# Patient Record
Sex: Female | Born: 1980 | Race: Black or African American | Hispanic: No | Smoking: Never smoker
Health system: Southern US, Community
[De-identification: ages and names within clinical notes are randomized; demographics above are authoritative.]

## PROBLEM LIST (undated history)

## (undated) ENCOUNTER — Ambulatory Visit: Discharge status: Absent without leave | Payer: BC Managed Care – PPO

## (undated) DIAGNOSIS — K5900 Constipation, unspecified: Secondary | ICD-10-CM

## (undated) DIAGNOSIS — R079 Chest pain, unspecified: Secondary | ICD-10-CM

## (undated) DIAGNOSIS — D447 Neoplasm of uncertain behavior of aortic body and other paraganglia: Secondary | ICD-10-CM

## (undated) DIAGNOSIS — M549 Dorsalgia, unspecified: Secondary | ICD-10-CM

## (undated) DIAGNOSIS — E785 Hyperlipidemia, unspecified: Secondary | ICD-10-CM

## (undated) DIAGNOSIS — Z91018 Allergy to other foods: Secondary | ICD-10-CM

## (undated) DIAGNOSIS — F32A Depression, unspecified: Secondary | ICD-10-CM

## (undated) DIAGNOSIS — F329 Major depressive disorder, single episode, unspecified: Secondary | ICD-10-CM

## (undated) DIAGNOSIS — F419 Anxiety disorder, unspecified: Secondary | ICD-10-CM

## (undated) DIAGNOSIS — R112 Nausea with vomiting, unspecified: Secondary | ICD-10-CM

## (undated) DIAGNOSIS — E669 Obesity, unspecified: Secondary | ICD-10-CM

## (undated) DIAGNOSIS — Z973 Presence of spectacles and contact lenses: Secondary | ICD-10-CM

## (undated) DIAGNOSIS — M7989 Other specified soft tissue disorders: Secondary | ICD-10-CM

## (undated) DIAGNOSIS — R002 Palpitations: Secondary | ICD-10-CM

## (undated) DIAGNOSIS — Z9889 Other specified postprocedural states: Secondary | ICD-10-CM

## (undated) DIAGNOSIS — F988 Other specified behavioral and emotional disorders with onset usually occurring in childhood and adolescence: Secondary | ICD-10-CM

## (undated) DIAGNOSIS — R7303 Prediabetes: Secondary | ICD-10-CM

## (undated) DIAGNOSIS — E559 Vitamin D deficiency, unspecified: Secondary | ICD-10-CM

## (undated) DIAGNOSIS — I10 Essential (primary) hypertension: Secondary | ICD-10-CM

## (undated) DIAGNOSIS — I499 Cardiac arrhythmia, unspecified: Secondary | ICD-10-CM

## (undated) DIAGNOSIS — D649 Anemia, unspecified: Secondary | ICD-10-CM

## (undated) DIAGNOSIS — M2550 Pain in unspecified joint: Secondary | ICD-10-CM

## (undated) DIAGNOSIS — R0602 Shortness of breath: Secondary | ICD-10-CM

## (undated) HISTORY — DX: Dorsalgia, unspecified: M54.9

## (undated) HISTORY — PX: TONSILLECTOMY: SUR1361

## (undated) HISTORY — DX: Chest pain, unspecified: R07.9

## (undated) HISTORY — DX: Shortness of breath: R06.02

## (undated) HISTORY — DX: Allergy to other foods: Z91.018

## (undated) HISTORY — DX: Prediabetes: R73.03

## (undated) HISTORY — PX: BACK SURGERY: SHX140

## (undated) HISTORY — DX: Vitamin D deficiency, unspecified: E55.9

## (undated) HISTORY — DX: Other specified soft tissue disorders: M79.89

## (undated) HISTORY — DX: Hyperlipidemia, unspecified: E78.5

## (undated) HISTORY — DX: Constipation, unspecified: K59.00

## (undated) HISTORY — DX: Depression, unspecified: F32.A

## (undated) HISTORY — DX: Anemia, unspecified: D64.9

## (undated) HISTORY — DX: Neoplasm of uncertain behavior of aortic body and other paraganglia: D44.7

## (undated) HISTORY — PX: DILATION AND CURETTAGE OF UTERUS: SHX78

## (undated) HISTORY — PX: OTHER SURGICAL HISTORY: SHX169

## (undated) HISTORY — PX: WISDOM TOOTH EXTRACTION: SHX21

## (undated) HISTORY — DX: Obesity, unspecified: E66.9

## (undated) HISTORY — DX: Other specified behavioral and emotional disorders with onset usually occurring in childhood and adolescence: F98.8

## (undated) HISTORY — DX: Anxiety disorder, unspecified: F41.9

## (undated) HISTORY — PX: SALPINGECTOMY: SHX328

## (undated) HISTORY — DX: Pain in unspecified joint: M25.50

---
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
## 2002-06-21 ENCOUNTER — Encounter: Payer: Self-pay | Admitting: Internal Medicine

## 2002-06-21 ENCOUNTER — Emergency Department (HOSPITAL_COMMUNITY): Admission: EM | Admit: 2002-06-21 | Discharge: 2002-06-21 | Payer: Self-pay | Admitting: Internal Medicine

## 2002-11-09 ENCOUNTER — Ambulatory Visit (HOSPITAL_COMMUNITY): Admission: RE | Admit: 2002-11-09 | Discharge: 2002-11-09 | Payer: Self-pay | Admitting: Otolaryngology

## 2002-11-09 ENCOUNTER — Encounter: Payer: Self-pay | Admitting: Otolaryngology

## 2002-12-29 ENCOUNTER — Encounter: Payer: Self-pay | Admitting: Family Medicine

## 2002-12-29 ENCOUNTER — Ambulatory Visit (HOSPITAL_COMMUNITY): Admission: RE | Admit: 2002-12-29 | Discharge: 2002-12-29 | Payer: Self-pay | Admitting: Family Medicine

## 2002-12-31 ENCOUNTER — Encounter (INDEPENDENT_AMBULATORY_CARE_PROVIDER_SITE_OTHER): Payer: Self-pay | Admitting: Specialist

## 2002-12-31 ENCOUNTER — Ambulatory Visit (HOSPITAL_BASED_OUTPATIENT_CLINIC_OR_DEPARTMENT_OTHER): Admission: RE | Admit: 2002-12-31 | Discharge: 2002-12-31 | Payer: Self-pay | Admitting: Otolaryngology

## 2003-01-02 ENCOUNTER — Emergency Department (HOSPITAL_COMMUNITY): Admission: EM | Admit: 2003-01-02 | Discharge: 2003-01-03 | Payer: Self-pay | Admitting: Emergency Medicine

## 2003-01-04 ENCOUNTER — Inpatient Hospital Stay (HOSPITAL_COMMUNITY): Admission: AD | Admit: 2003-01-04 | Discharge: 2003-01-06 | Payer: Self-pay | Admitting: Family Medicine

## 2003-02-03 ENCOUNTER — Emergency Department (HOSPITAL_COMMUNITY): Admission: EM | Admit: 2003-02-03 | Discharge: 2003-02-03 | Payer: Self-pay | Admitting: Internal Medicine

## 2003-04-09 ENCOUNTER — Encounter (HOSPITAL_COMMUNITY): Admission: RE | Admit: 2003-04-09 | Discharge: 2003-05-09 | Payer: Self-pay | Admitting: Family Medicine

## 2003-04-09 ENCOUNTER — Encounter: Payer: Self-pay | Admitting: Family Medicine

## 2004-03-06 ENCOUNTER — Ambulatory Visit (HOSPITAL_COMMUNITY): Admission: RE | Admit: 2004-03-06 | Discharge: 2004-03-06 | Payer: Self-pay | Admitting: Obstetrics & Gynecology

## 2004-03-21 ENCOUNTER — Ambulatory Visit (HOSPITAL_COMMUNITY): Admission: RE | Admit: 2004-03-21 | Discharge: 2004-03-21 | Payer: Self-pay | Admitting: Obstetrics & Gynecology

## 2004-04-08 ENCOUNTER — Emergency Department (HOSPITAL_COMMUNITY): Admission: EM | Admit: 2004-04-08 | Discharge: 2004-04-09 | Payer: Self-pay | Admitting: Emergency Medicine

## 2004-08-29 ENCOUNTER — Ambulatory Visit: Payer: Self-pay | Admitting: Family Medicine

## 2004-12-01 ENCOUNTER — Ambulatory Visit: Payer: Self-pay | Admitting: Family Medicine

## 2004-12-13 ENCOUNTER — Ambulatory Visit: Payer: Self-pay | Admitting: Family Medicine

## 2005-02-21 ENCOUNTER — Ambulatory Visit: Payer: Self-pay | Admitting: Family Medicine

## 2005-02-26 ENCOUNTER — Ambulatory Visit: Payer: Self-pay | Admitting: Family Medicine

## 2005-03-01 ENCOUNTER — Ambulatory Visit: Payer: Self-pay | Admitting: Family Medicine

## 2005-03-07 ENCOUNTER — Ambulatory Visit: Payer: Self-pay | Admitting: Orthopedic Surgery

## 2005-03-13 ENCOUNTER — Ambulatory Visit: Payer: Self-pay | Admitting: Family Medicine

## 2005-03-14 ENCOUNTER — Ambulatory Visit (HOSPITAL_COMMUNITY): Admission: RE | Admit: 2005-03-14 | Discharge: 2005-03-14 | Payer: Self-pay | Admitting: Family Medicine

## 2005-03-20 ENCOUNTER — Ambulatory Visit (HOSPITAL_COMMUNITY): Admission: RE | Admit: 2005-03-20 | Discharge: 2005-03-20 | Payer: Self-pay | Admitting: Family Medicine

## 2005-04-09 ENCOUNTER — Ambulatory Visit: Payer: Self-pay | Admitting: Family Medicine

## 2005-05-01 ENCOUNTER — Ambulatory Visit: Payer: Self-pay | Admitting: Psychology

## 2005-06-20 ENCOUNTER — Ambulatory Visit: Payer: Self-pay | Admitting: Family Medicine

## 2005-08-09 ENCOUNTER — Ambulatory Visit: Payer: Self-pay | Admitting: Family Medicine

## 2005-08-13 ENCOUNTER — Ambulatory Visit (HOSPITAL_COMMUNITY): Admission: RE | Admit: 2005-08-13 | Discharge: 2005-08-13 | Payer: Self-pay | Admitting: Family Medicine

## 2005-09-25 ENCOUNTER — Ambulatory Visit: Payer: Self-pay | Admitting: Family Medicine

## 2005-10-26 ENCOUNTER — Ambulatory Visit: Payer: Self-pay | Admitting: Family Medicine

## 2006-01-21 ENCOUNTER — Ambulatory Visit: Payer: Self-pay | Admitting: Family Medicine

## 2006-01-22 ENCOUNTER — Ambulatory Visit (HOSPITAL_COMMUNITY): Admission: RE | Admit: 2006-01-22 | Discharge: 2006-01-22 | Payer: Self-pay | Admitting: Family Medicine

## 2006-05-17 ENCOUNTER — Emergency Department (HOSPITAL_COMMUNITY): Admission: EM | Admit: 2006-05-17 | Discharge: 2006-05-17 | Payer: Self-pay | Admitting: Emergency Medicine

## 2007-01-05 ENCOUNTER — Emergency Department (HOSPITAL_COMMUNITY): Admission: EM | Admit: 2007-01-05 | Discharge: 2007-01-05 | Payer: Self-pay | Admitting: Emergency Medicine

## 2007-07-02 ENCOUNTER — Ambulatory Visit: Payer: Self-pay | Admitting: Family Medicine

## 2007-07-08 ENCOUNTER — Other Ambulatory Visit: Admission: RE | Admit: 2007-07-08 | Discharge: 2007-07-08 | Payer: Self-pay | Admitting: Obstetrics and Gynecology

## 2007-08-06 ENCOUNTER — Ambulatory Visit: Payer: Self-pay | Admitting: Family Medicine

## 2007-08-06 LAB — CONVERTED CEMR LAB: TSH: 2.022 microintl units/mL (ref 0.350–5.50)

## 2007-09-08 ENCOUNTER — Ambulatory Visit: Payer: Self-pay | Admitting: Family Medicine

## 2007-09-09 ENCOUNTER — Encounter: Payer: Self-pay | Admitting: Family Medicine

## 2007-09-09 LAB — CONVERTED CEMR LAB
Chlamydia, DNA Probe: NEGATIVE
GC Probe Amp, Genital: NEGATIVE
Gardnerella vaginalis: POSITIVE — AB
Trichomonal Vaginitis: NEGATIVE

## 2007-11-14 ENCOUNTER — Encounter: Payer: Self-pay | Admitting: Family Medicine

## 2007-11-14 DIAGNOSIS — N760 Acute vaginitis: Secondary | ICD-10-CM | POA: Insufficient documentation

## 2008-06-15 ENCOUNTER — Other Ambulatory Visit: Admission: RE | Admit: 2008-06-15 | Discharge: 2008-06-15 | Payer: Self-pay | Admitting: Unknown Physician Specialty

## 2008-06-15 ENCOUNTER — Encounter (INDEPENDENT_AMBULATORY_CARE_PROVIDER_SITE_OTHER): Payer: Self-pay | Admitting: Unknown Physician Specialty

## 2008-09-17 ENCOUNTER — Emergency Department (HOSPITAL_COMMUNITY): Admission: EM | Admit: 2008-09-17 | Discharge: 2008-09-17 | Payer: Self-pay | Admitting: Emergency Medicine

## 2008-09-29 ENCOUNTER — Ambulatory Visit: Payer: Self-pay | Admitting: Family Medicine

## 2008-09-29 DIAGNOSIS — N949 Unspecified condition associated with female genital organs and menstrual cycle: Secondary | ICD-10-CM

## 2008-09-29 DIAGNOSIS — N3000 Acute cystitis without hematuria: Secondary | ICD-10-CM

## 2008-09-29 LAB — CONVERTED CEMR LAB
Bilirubin Urine: NEGATIVE
Glucose, Urine, Semiquant: NEGATIVE
Ketones, urine, test strip: NEGATIVE
Specific Gravity, Urine: 1.02

## 2008-09-30 ENCOUNTER — Encounter: Payer: Self-pay | Admitting: Family Medicine

## 2008-10-03 DIAGNOSIS — J45909 Unspecified asthma, uncomplicated: Secondary | ICD-10-CM | POA: Insufficient documentation

## 2009-03-19 ENCOUNTER — Emergency Department (HOSPITAL_COMMUNITY): Admission: EM | Admit: 2009-03-19 | Discharge: 2009-03-20 | Payer: Self-pay | Admitting: Emergency Medicine

## 2009-03-23 ENCOUNTER — Ambulatory Visit: Payer: Self-pay | Admitting: Family Medicine

## 2009-03-23 DIAGNOSIS — N912 Amenorrhea, unspecified: Secondary | ICD-10-CM

## 2009-03-23 DIAGNOSIS — R51 Headache: Secondary | ICD-10-CM | POA: Insufficient documentation

## 2009-03-23 DIAGNOSIS — IMO0002 Reserved for concepts with insufficient information to code with codable children: Secondary | ICD-10-CM | POA: Insufficient documentation

## 2009-03-23 DIAGNOSIS — R519 Headache, unspecified: Secondary | ICD-10-CM | POA: Insufficient documentation

## 2009-03-25 ENCOUNTER — Emergency Department (HOSPITAL_COMMUNITY): Admission: EM | Admit: 2009-03-25 | Discharge: 2009-03-25 | Payer: Self-pay | Admitting: Emergency Medicine

## 2009-03-25 DIAGNOSIS — E663 Overweight: Secondary | ICD-10-CM | POA: Insufficient documentation

## 2009-09-13 ENCOUNTER — Other Ambulatory Visit: Admission: RE | Admit: 2009-09-13 | Discharge: 2009-09-13 | Payer: Self-pay | Admitting: Unknown Physician Specialty

## 2010-04-26 ENCOUNTER — Ambulatory Visit: Payer: Self-pay | Admitting: Family Medicine

## 2010-04-26 LAB — CONVERTED CEMR LAB
Glucose, Urine, Semiquant: NEGATIVE
Nitrite: NEGATIVE
Specific Gravity, Urine: 1.02
Urobilinogen, UA: 0.2
WBC Urine, dipstick: NEGATIVE
pH: 7

## 2010-04-28 ENCOUNTER — Encounter: Payer: Self-pay | Admitting: Family Medicine

## 2010-08-21 ENCOUNTER — Emergency Department (HOSPITAL_COMMUNITY): Admission: EM | Admit: 2010-08-21 | Discharge: 2010-08-21 | Payer: Self-pay | Admitting: Emergency Medicine

## 2010-08-23 ENCOUNTER — Telehealth: Payer: Self-pay | Admitting: Family Medicine

## 2010-09-21 ENCOUNTER — Telehealth: Payer: Self-pay | Admitting: Family Medicine

## 2010-11-12 ENCOUNTER — Encounter: Payer: Self-pay | Admitting: Family Medicine

## 2010-11-12 ENCOUNTER — Encounter: Payer: Self-pay | Admitting: Obstetrics & Gynecology

## 2010-11-21 NOTE — Letter (Signed)
Summary: TB Skin Test  [facility]  [address]   [place], [place] [address]   Phone: [phone]  Fax: [phone]          TB Skin Test      Kerrington BROWN DOB: October 11, 1981   PPD Skin Test:    Vaccine Type: PPD    Site: left forearm    Mfr: Sanofi Pasteur    Dose: 0.1 ml    Route: ID    Given by: Adella Hare LPN    Exp. Date: 08/04/2011    Lot #: [id number]    Result: Negative, 0mm    Read by: Adella Hare LPN  April 29, 2951 2:29 PM

## 2010-11-21 NOTE — Assessment & Plan Note (Signed)
Summary: school physical- room 1   Vital Signs:  Patient profile:   30 year old female Menstrual status:  irregular Height:      64.5 inches Weight:      173.75 pounds BMI:     29.47 O2 Sat:      99 % on Room air Pulse rate:   87 / minute Resp:     16 per minute BP sitting:   100 / 68  (left arm)  Vitals Entered By: Adella Hare LPN (April 27, 5783 2:13 PM) CC: school physical Is Patient Diabetic? No Pain Assessment Patient in pain? no        CC:  school physical.  History of Present Illness: Pt is here today for physical for school.  She is unemployed, and is taking a Financial planner.  This starts in approx 10 days.    Is going to a [facility] Clinic in [place].  Started on Phentermine daily, and once weekly HCG injections. She has started exercising regularly and is trying to eat healthier.  Pt has no complaints or concerns. See's GYN for paps, etc.   Allergies (verified): 1)  ! Rocephin  Past History:  Past medical, surgical, family and social histories (including risk factors) reviewed, and no changes noted (except as noted below).  Past Medical History: OBESITY, UNSPECIFIED (ICD-278.00) DEPRESSION (ICD-311) - Situational during divorce, Resolved  Past Surgical History: Reviewed history from 11/14/2007 and no changes required. Tonsillectomy  Family History: Reviewed history from 11/14/2007 and no changes required. Mother   living   Hypertension, Depression, Diabets Mellitus Father  living    b52  deficiency two Brothers  living  one Asthma/  one  Seizure disorder  Social History: Reviewed history from 11/14/2007 and no changes required. Single Never Smoked Alcohol use-no Drug use-no  Review of Systems General:  Denies chills, fatigue, fever, and loss of appetite. Eyes:  Denies blurring and double vision. ENT:  Denies decreased hearing, earache, nasal congestion, ringing in ears, and sore throat. CV:  Denies chest pain or discomfort,  palpitations, and swelling of feet. Resp:  Denies cough and shortness of breath. GI:  Denies abdominal pain, bloody stools, change in bowel habits, dark tarry stools, indigestion, loss of appetite, nausea, and vomiting. GU:  Denies abnormal vaginal bleeding, dysuria, incontinence, and urinary frequency. MS:  Denies joint pain, joint swelling, low back pain, and mid back pain. Neuro:  Denies headaches, numbness, and tingling. Psych:  Denies anxiety and depression.  Physical Exam  General:  Well-developed,well-nourished,in no acute distress; alert,appropriate and cooperative throughout examination Head:  Normocephalic and atraumatic without obvious abnormalities. No apparent alopecia or balding. Eyes:  pupils equal, pupils round, pupils reactive to light, and no nystagmus.   Ears:  External ear exam shows no significant lesions or deformities.  Otoscopic examination reveals clear canals, tympanic membranes are intact bilaterally without bulging, retraction, inflammation or discharge. Hearing is grossly normal bilaterally. Nose:  External nasal examination shows no deformity or inflammation. Nasal mucosa are pink and moist without lesions or exudates. Mouth:  Oral mucosa and oropharynx without lesions or exudates.  Teeth in good repair. Neck:  No deformities, masses, or tenderness noted.no thyromegaly and no thyroid nodules or tenderness.   Lungs:  Normal respiratory effort, chest expands symmetrically. Lungs are clear to auscultation, no crackles or wheezes. Heart:  Normal rate and regular rhythm. S1 and S2 normal without gallop, murmur, click, rub or other extra sounds. Abdomen:  Bowel sounds positive,abdomen soft and non-tender  without masses, organomegaly or hernias noted. Msk:  Spine:  Nl ROM.  Pulses:  R radial normal, R posterior tibial normal, R dorsalis pedis normal, L radial normal, L posterior tibial normal, and L dorsalis pedis normal.   Extremities:  No clubbing, cyanosis, edema, or  deformity noted with normal full range of motion of all joints.   Neurologic:  alert & oriented X3, strength normal in all extremities, sensation intact to light touch, gait normal, and DTRs symmetrical and normal.   Skin:  Intact without suspicious lesions or rashes Cervical Nodes:  No lymphadenopathy noted Psych:  Cognition and judgment appear intact. Alert and cooperative with normal attention span and concentration. No apparent delusions, illusions, hallucinations   Impression & Recommendations:  Problem # 1:  OTH GENERAL MEDICAL EXAMINATION ADMIN PURPOSES (ICD-V70.3) Assessment New  Complete Medication List: 1)  Albuterol 90 Mcg/act Aers (Albuterol) .... Use two puffs every 4-6 hours 2)  Phentermine Hcl 37.5 Mg Caps (Phentermine hcl) .... Take once daily  Other Orders: Urinalysis ([id number]) TB Skin Test (09811) Admin 1st Vaccine (91478)  Patient Instructions: 1)  Please schedule a follow-up appointment in 6 months. 2)  Continue with exercise and wt loss efforts. 3)  Good luck with school! 4)  Please check with the health dept for your last Tetnus vaccine date.   Immunizations Administered:  PPD Skin Test:    Vaccine Type: PPD    Site: left forearm    Mfr: Sanofi Pasteur    Dose: 0.1 ml    Route: ID    Given by: Adella Hare LPN    Exp. Date: 08/04/2011    Lot #: [id number]  Laboratory Results   Urine Tests  Date/Time Received: April 26, 2010 3:30 PM  Date/Time Reported: April 26, 2010 3:30 PM   Routine Urinalysis   Color: yellow Appearance: Clear Glucose: negative   (Normal Range: Negative) Bilirubin: negative   (Normal Range: Negative) Ketone: trace (5)   (Normal Range: Negative) Spec. Gravity: 1.020   (Normal Range: 1.003-1.035) Blood: negative   (Normal Range: Negative) pH: 7.0   (Normal Range: 5.0-8.0) Protein: negative   (Normal Range: Negative) Urobilinogen: 0.2   (Normal Range: 0-1) Nitrite: negative   (Normal Range: Negative) Leukocyte Esterace:  negative   (Normal Range: Negative)          Appended Document: school physical- room 1   PPD Results    Date of reading: 04/28/2010    Results: < 5mm    Interpretation: negative

## 2010-11-21 NOTE — Progress Notes (Signed)
Summary: MENSTRUAL PROBLEMS  Phone Note Call from Patient   Summary of Call: PATIENT CALLD STATES THAT HER  MENSTRAL IS VERY HEAVY BLEEDING,BLOOD CLOTS AND SHE GET LIGHT HEADED AND IN A LOT OF PAIN....PLSE CALL [phone]...  Initial call taken by: Eugenio Hoes,  September 21, 2010 11:21 AM  Follow-up for Phone Call        based on severity of her symptoms she needs to go to ed since reports lightheaded and f/u  withl gynae , pls let her know  Follow-up by: Syliva Overman MD,  September 21, 2010 5:08 PM  Additional Follow-up for Phone Call Additional follow up Details #1::        returned call, no answer, no voicemail set up Additional Follow-up by: Adella Hare LPN,  September 22, 2010 3:57 PM

## 2010-11-21 NOTE — Progress Notes (Signed)
Summary: note  Phone Note Call from Patient   Summary of Call: went to ed monday  at [facility] and the doctor put her out of work for the rest of the week it is for pt physical training  call at  [phone] Initial call taken by: Lind Guest,  August 23, 2010 12:01 PM  Follow-up for Phone Call        let pt know she needs to provide me with the info thAT STATE SHE IS TO BE OUT if you are unable to obtain this from the ED printed record Follow-up by: Syliva Overman MD,  August 23, 2010 7:58 PM  Additional Follow-up for Phone Call Additional follow up Details #1::        called patient, no answer Additional Follow-up by: Adella Hare LPN,  August 25, 2010 4:15 PM    Additional Follow-up for Phone Call Additional follow up Details #2::    patient has not called back re this matter Follow-up by: Adella Hare LPN,  August 28, 2010 2:59 PM

## 2011-01-03 LAB — URINALYSIS, ROUTINE W REFLEX MICROSCOPIC
Bilirubin Urine: NEGATIVE
Glucose, UA: NEGATIVE mg/dL
Nitrite: NEGATIVE
Urobilinogen, UA: 0.2 mg/dL (ref 0.0–1.0)
pH: 5.5 (ref 5.0–8.0)

## 2011-01-29 ENCOUNTER — Telehealth: Payer: Self-pay | Admitting: Family Medicine

## 2011-01-29 LAB — URINALYSIS, ROUTINE W REFLEX MICROSCOPIC
Bilirubin Urine: NEGATIVE
Ketones, ur: NEGATIVE mg/dL
Nitrite: NEGATIVE
Protein, ur: NEGATIVE mg/dL

## 2011-01-29 LAB — DIFFERENTIAL
Basophils Relative: 0 % (ref 0–1)
Eosinophils Absolute: 0 10*3/uL (ref 0.0–0.7)
Monocytes Absolute: 0.5 10*3/uL (ref 0.1–1.0)
Neutro Abs: 2.5 10*3/uL (ref 1.7–7.7)
Neutrophils Relative %: 62 % (ref 43–77)

## 2011-01-29 LAB — PREGNANCY, URINE: Preg Test, Ur: NEGATIVE

## 2011-01-29 LAB — CBC
HCT: 37.1 % (ref 36.0–46.0)
MCHC: 34.2 g/dL (ref 30.0–36.0)
MCV: 86.1 fL (ref 78.0–100.0)
RBC: 4.32 MIL/uL (ref 3.87–5.11)
WBC: 4 10*3/uL (ref 4.0–10.5)

## 2011-01-29 LAB — BASIC METABOLIC PANEL
Chloride: 106 mEq/L (ref 96–112)
GFR calc non Af Amer: >60 mL/min (ref >60)
Glucose, Bld: 83 mg/dL (ref 70–99)
Potassium: 3.6 mEq/L (ref 3.5–5.1)
Sodium: 139 mEq/L (ref 135–145)

## 2011-01-29 NOTE — Telephone Encounter (Signed)
psl add on today this morning if possible

## 2011-01-29 NOTE — Telephone Encounter (Signed)
Called patient and left message a come in the morning at 815

## 2011-01-30 ENCOUNTER — Encounter: Payer: Self-pay | Admitting: Family Medicine

## 2011-01-30 ENCOUNTER — Ambulatory Visit (INDEPENDENT_AMBULATORY_CARE_PROVIDER_SITE_OTHER): Payer: Self-pay | Admitting: Family Medicine

## 2011-01-30 VITALS — BP 112/70 | HR 90 | Resp 16 | Ht 64.5 in | Wt 166.1 lb

## 2011-01-30 DIAGNOSIS — J45909 Unspecified asthma, uncomplicated: Secondary | ICD-10-CM

## 2011-01-30 DIAGNOSIS — E663 Overweight: Secondary | ICD-10-CM

## 2011-01-30 DIAGNOSIS — N760 Acute vaginitis: Secondary | ICD-10-CM

## 2011-01-30 MED ORDER — ALBUTEROL SULFATE HFA 108 (90 BASE) MCG/ACT IN AERS: 2.0000 | INHALATION_SPRAY | Freq: Four times a day (QID) | RESPIRATORY_TRACT | Status: DC | PRN

## 2011-01-30 NOTE — Progress Notes (Signed)
  Subjective:    Patient ID: Shannon [name], female    DOB: [birth year], 30 y.o.   MRN: [id number]  HPI  Pt was in the ED 5 days ago for dysuria and frequency, as well as vaginal burning. She was treated for a UTI with Rocephin , since then she has been active  3 days ago, her symptoms have fklared up with severe perineal and vulval burning , pain rawness and rash. She denies fever or chills. She has dysuria with no frequency. No new personal care products. She is miserable and in a lot of pain  Review of Systems Denies recent fever or chills. Denies sinus pressure, nasal congestion, ear pain or sore throat. Denies chest congestion, productive cough or wheezing. Denies chest pains, palpitations, paroxysmal nocturnal dyspnea, orthopnea and leg swelling        Objective:   Physical Exam    Patient alert and oriented and in no Cardiopulmonary distress.She is in exquisite pain however.  HEENT: No facial asymmetry, EOMI, no sinus tenderness, TM's clear, Oropharynx pink and moist.  Neck supple no adenopathy.  Chest: Clear to auscultation bilaterally.  CVS: S1, S2 no murmurs, no S3.  ABD: Soft non tender. Bowel sounds normal.  Ext: No edema    Skin:  ulcerations in vulva. GU: speculum exam limited by pain, however erythema , swelling and ulceration noted, also clear d/c  CNS: CN 2-12 intact, power, tone and sensation normal throughout.     Assessment & Plan:

## 2011-01-30 NOTE — Patient Instructions (Addendum)
F/u  In 4 to 5 months. Specimens will be sent for testing , and you will be notified as soon as they are available.  It is important that you exercise regularly at least 30 minutes 5 times a week. If you develop chest pain, have severe difficulty breathing, or feel very tired, stop exercising immediately and seek medical attention  A healthy diet is rich in fruit, vegetables and whole grains. Poultry fish, nuts and beans are a healthy choice for protein rather then red meat. A low sodium diet and drinking 64 ounces of water daily is generally recommended. Oils and sweet should be limited. Carbohydrates especially for those who are diabetic or overweight, should be limited to 34-45 gram per meal. It is important to eat on a regular schedule, at least 3 times daily. Snacks should be primarily fruits, vegetables or nuts.  Pls start using a cream for fungal infection on the skin in your groin that itches and is painful. OK to use vaseline as a barrier. 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 .Condom use is advised at all times

## 2011-01-31 ENCOUNTER — Telehealth: Payer: Self-pay

## 2011-01-31 LAB — WET PREP BY MOLECULAR PROBE
Candida species: NEGATIVE
Gardnerella vaginalis: NEGATIVE

## 2011-01-31 LAB — GC/CHLAMYDIA PROBE AMP, GENITAL: GC Probe Amp, Genital: NEGATIVE

## 2011-01-31 MED ORDER — FLUCONAZOLE 100 MG PO TABS: 100.0000 mg | ORAL_TABLET | Freq: Every day | ORAL | Status: AC

## 2011-01-31 MED ORDER — LIDOCAINE HCL 2 % EX GEL: CUTANEOUS | Status: AC

## 2011-01-31 NOTE — Telephone Encounter (Signed)
Spoke with pt and will send in topical lidocaine for application to vulva

## 2011-01-31 NOTE — Progress Notes (Signed)
Patient aware.

## 2011-02-01 ENCOUNTER — Emergency Department (HOSPITAL_COMMUNITY)
Admission: EM | Admit: 2011-02-01 | Discharge: 2011-02-01 | Disposition: A | Discharge status: Home / Self Care | Payer: Medicaid Other | Attending: Emergency Medicine | Admitting: Emergency Medicine

## 2011-02-01 ENCOUNTER — Telehealth: Payer: Self-pay | Admitting: Family Medicine

## 2011-02-01 DIAGNOSIS — L509 Urticaria, unspecified: Secondary | ICD-10-CM | POA: Insufficient documentation

## 2011-02-01 DIAGNOSIS — J45909 Unspecified asthma, uncomplicated: Secondary | ICD-10-CM | POA: Insufficient documentation

## 2011-02-01 NOTE — Telephone Encounter (Signed)
The patient took a diflucan this morning and it caused a reaction and she broke out all over her stomach and inside her mouth is all swollen and welted up and she is still severely swollen in her vaginal area and she is aware that all her tests were negative. Wants to know what else it could be since her tests were negative but right now she is at the ER from her allergic reaction from the diflucan.

## 2011-02-01 NOTE — Telephone Encounter (Signed)
pls see my 2:12 response

## 2011-02-01 NOTE — Telephone Encounter (Signed)
The lab is faxing the HSV 2 results and will forward the message to the doctor about her stomach being raw.

## 2011-02-01 NOTE — Telephone Encounter (Signed)
I suggest she have gynae eval the area, she needs to call her  gynae  Office, also may ask the ED doc while there to reassess

## 2011-02-02 ENCOUNTER — Encounter: Payer: Self-pay | Admitting: Family Medicine

## 2011-02-02 NOTE — Telephone Encounter (Signed)
She was very pleased with the treatment at the hospital. She feels much better and doesn't think she needs the GYN appt

## 2011-02-07 ENCOUNTER — Encounter: Payer: Self-pay | Admitting: Family Medicine

## 2011-02-18 ENCOUNTER — Encounter: Payer: Self-pay | Admitting: Family Medicine

## 2011-02-18 DIAGNOSIS — N760 Acute vaginitis: Secondary | ICD-10-CM | POA: Insufficient documentation

## 2011-02-18 NOTE — Assessment & Plan Note (Signed)
Deteriorated, specimens sent and symptomatic treatment prescribed

## 2011-02-18 NOTE — Assessment & Plan Note (Signed)
Improved. Pt applauded on succesful weight loss through lifestyle change, and encouraged to continue same. Weight loss goal set for the next several months.  

## 2011-02-18 NOTE — Assessment & Plan Note (Signed)
Stable , proventil used infrequently on an as needed basis

## 2011-03-09 NOTE — H&P (Signed)
NAME:  Shannon [name], Shannon [name]                            ACCOUNT NO.:  [id number]   MEDICAL RECORD NO.:  [id number]                   PATIENT TYPE:  INP   LOCATION:  A325                                 FACILITY:  [facility]   PHYSICIAN:  Hanley Hays. Dechurch, M.D.           DATE OF BIRTH:  [birth year]   DATE OF ADMISSION:  01/04/2003  DATE OF DISCHARGE:                                HISTORY & PHYSICAL   HISTORY OF PRESENT ILLNESS:  A 30 year old African-American female with a  past medical history of asthma, obesity, and recurrent tonsillitis status  post elected tonsillectomy on December 31, 2002, without complication who  developed nausea and vomiting following the procedure which persisted.  She  then developed associated diarrhea and fevers.  She was seen at the [facility] ER and given IV fluids and discharged home as she apparently  was stable.  She states she had a temperature of 102.  Because of ongoing  vomiting and inability to maintain p.o.'s including her medications, the  patient is being referred for admission by her primary care Zaivion Kundrat,  Milus Mallick. Lodema Hong, M.D.  The patient's pain actually was initially  controlled until she started vomiting, now she is very uncomfortable which  is understandable.  She has no history of previous symptoms of such although  she does note after her D&C she had nausea and vomiting.  She has a 2-year-  old child who is in daycare but has not been sick and is a Archivist  at [employer].  She denies any headache, no upper respiratory symptoms.  She is a  nonsmoker, nondrinker.  She is sexually active but has had two negative  serum pregnancy tests in the last week preop and has not been sexually  active in that time.  She is on oral contraceptives, just completed her  inactive pills, and had a small withdrawal bleed.  She denies any abdominal  pain per se.  She states she has had several large loose bowel movements  which is unusual for her.   MEDICATIONS:  Include Meridia, Prevacid, oral contraceptives, and  hydrochlorothiazide all of which have not been able to be taken since her  second postop day.  She is also on Roxicet and cephalexin which she had not  taken in the last 24 hours.   ALLERGIES:  She is ALLERGIC to EGGS which cause nausea.  There are no  anaphylaxis.   PAST MEDICAL HISTORY:  Gravida 1, para 1, AB 0.  She is status post D&C  which was done for dysfunctional uterine bleeding apparently.  History of  tonsillectomy December 31, 2002 and hypertension, gastroesophageal reflux.   FAMILY HISTORY:  Family medical history is noncontributory.   SOCIAL HISTORY:  She lives with her mother and 29-year-old child.  She is a  Archivist at [employer].  She is single, sexually active.  No tobacco or  alcohol  abuse.   PHYSICAL EXAMINATION:  GENERAL:  An obese well-developed, well-nourished  very alert and appropriate African-American female who has a nonfocal  neurologic exam without tremors.  SKIN:  Without rashes or breakdown.  HEENT:  Oropharynx is moist.  There is bilateral posterior pharyngeal ulcers  and postsurgical changes, mild edema, good airway.  There is no adenopathy  noted of the neck.  There is no thrush.  LUNGS:  Clear to auscultation anterior and posterior.  HEART:  Regular, tachycardic, rate 120.  Blood pressure 140/90.  No murmurs  present.  ABDOMEN:  Obese, soft, nontender.  EXTREMITIES:  Without clubbing, cyanosis, or edema.  NEUROLOGICAL:  Intact.   ASSESSMENT AND PLAN:  1. Probable viral gastroenteritis in a 30 year old usually healthy female     who was status post tonsillectomy now with intractable pain secondary to     vomiting.  She is clinically dehydrated as far as that she is tachycardic     though her labs are essentially within normal limits.  She does have a     mildly depressed albumin at 3.  2. Status post tonsillectomy.  She is on postoperative antibiotics which we     will continue.   We will treat her pain symptomatically and attempt some     Hurricaine liquid diluted in Miracle Mouthwash.  3. Hypertension.  Resume her hydrochlorothiazide once she is taking p.o.,     monitor her blood pressure, and more aggressive treatment if indicated.  4. History of gastroesophageal reflux which may or may not be playing a     role.  I am going to give her some IV Pepcid for the next 24 hours until     she is taking p.o. and resume her proton-pump inhibitor.  5. History of asthma currently stable.  6. Obesity.  The patient had been on Meridia.  Again, this can be resumed     once she is back to taking her usual diet.                                               Hanley Hays Josefine Class, M.D.    FED/MEDQ  D:  01/04/2003  T:  01/04/2003  Job:  [id number]   cc:   Milus Mallick. Lodema Hong, M.D.  [address]  [place], [place] [address]  Fax: [phone]

## 2011-03-09 NOTE — Op Note (Signed)
NAME:  Shannon [name], Shannon [name]                            ACCOUNT NO.:  [id number]   MEDICAL RECORD NO.:  [id number]                   PATIENT TYPE:  AMB   LOCATION:  DSC                                  FACILITY:  [facility]   PHYSICIAN:  Suzanna Obey, M.D.                    DATE OF BIRTH:  24-Oct-1980   DATE OF PROCEDURE:  12/31/2002  DATE OF DISCHARGE:                                 OPERATIVE REPORT   PREOPERATIVE DIAGNOSIS:  Chronic tonsillitis and nasal obstruction.   POSTOPERATIVE DIAGNOSIS:  Chronic tonsillitis and nasal obstruction.   PROCEDURE:  Tonsillectomy and submucous resection of inferior turbinates.   ANESTHESIA:  General endotracheal tube.   ESTIMATED BLOOD LOSS:  Less than 10 mL.   INDICATIONS FOR PROCEDURE:  This is a 30 year old who has had problem with  repetitive tonsillitis episodes that have been refractory to medical  therapy. She has had a persistent problem that has continued to give her  symptoms with her tonsils.  She also has a chronic nasal obstruction problem  that has also been refractory to medical therapy. She was informed of the  risks and benefits of the procedure including bleeding, infection,  nasopharyngeal insufficiency, change in her voice, chronic crusting and  drying, scarring of the nose, and risks of the anesthetic. All questions  were answered and consent was obtained.   DESCRIPTION OF PROCEDURE:  The patient was taken to the operating room and  placed in the supine position. After adequate general endotracheal tube  anesthesia, was placed in the Rose position, draped in the usual sterile  fashion.  Crowe-Davis mouth gag was inserted, retracted, and suspended from  the Mayo stand.  The adenoid tissue was examined with a mirror and there was  no substantial amount of adenoid tissue present, certainly not obstructing  the nose.  The left tonsil was begun by making a left anterior tonsillar  pillar incision. It was very scarred to the capsule. It was  removed with  electrocautery dissection. Right tonsil was removed in the same fashion and  not nearly as scarred. The suction cautery was used to obtain hemostasis.  The Crowe-Davis was released and resuspended. There was hemostasis present  in both tonsillar fossae.  The oxymetazoline had been sprayed in the nose  and the inferior turbinates were injected with 1% lidocaine with 1:100,000  epinephrine. The turbinate was infractured. A midline incision was made with  a 15 blade. The mucosal flap elevated superiorly. The inferior mucosa and  bone were removed with the turbinate scissors. The edge was cauterized with  suction cautery. The flap was laid back down over the raw surface and the  turbinates were outfractured bilaterally. This did open up the nose very  nicely.  Telfa soaked in bacitracin was placed into the nose bilaterally and secured  with a 3-0 nylon. The patient was then removed of the  Crowe-Davis. The  patient was awakened, brought to the recovery room in stable condition.  Needle, sponge, and instrument count correct.                                               Suzanna Obey, M.D.    Cordelia Pen  D:  12/31/2002  T:  12/31/2002  Job:  [id number]   cc:   Milus Mallick. Lodema Hong, M.D.  [address]  [place], [place] [address]  Fax: [phone]

## 2011-03-09 NOTE — H&P (Signed)
NAME:  Shannon, [name]                            ACCOUNT NO.:  [id number]   MEDICAL RECORD NO.:  [id number]                   PATIENT TYPE:  AMB   LOCATION:  DAY                                  FACILITY:  [facility]   PHYSICIAN:  Lazaro Arms, M.D.                DATE OF BIRTH:  1981-03-13   DATE OF ADMISSION:  03/21/2004  DATE OF DISCHARGE:                                HISTORY & PHYSICAL   HISTORY OF PRESENT ILLNESS:  Shannon [name] is a 30 year old African American female  who has been having lower abdominal pain now for the past several weeks.  She has had a workup which has included prophylactic treatment with  antibiotics, despite negative urine cultures, a sed rate which was normal,  white count that was normal, hemoglobin and hematocrit that were normal.  She definitely had cervical motion tenderness.  Pelvic exam was quite tender  bilaterally and in the midline.  She did have a urine culture that was  positive for Enterococcus and she was treated with Macrobid.  We did an  ultrasound which showed a 4.9 x 4.4 x 4.3-cm complex left cystic adnexal  mass consistent with hemorrhagic cyst or maybe an endometrioma.  Because she  was so tender throughout, I am more concerned about the possibility of it  being endometriosis, and she is admitted for a diagnostic laparoscopy, left  ovarian cystectomy, a laparoscopic uterosacral nerve ablation for chronic  dysmenorrhea and dyspareunia and indicated procedures.  She did have a  negative potassium chloride bladder instillation test, which means she is  negative for interstitial cystitis.   PAST MEDICAL HISTORY:  Past medical history is otherwise negative.   PAST SURGICAL HISTORY:  Negative.   REVIEW OF SYSTEMS:  Review of systems otherwise negative.   ALLERGIES:  She has no known drug allergies.   CURRENT MEDICATIONS:  Her current medications are none.   PAST OBSTETRICAL HISTORY:  She has had 2 pregnancies; she has had 1 delivery  and 2  miscarriage.   PHYSICAL EXAMINATION:  HEENT:  Unremarkable.  NECK:  Thyroid is normal.  LUNGS:  Lungs are clear.  HEART:  Heart is regular rhythm without murmurs, regurgitation or gallop.  BREASTS:  Breasts without mass, discharge or skin changes.  ABDOMEN:  Abdomen is benign.  No hepatosplenomegaly or masses.  PELVIC:  She has normal external genitalia.  Vagina is pink and moist  without discharge.  Cervix is parous without lesions.  There is positive  cervical motion tenderness.  She has tenderness in both adnexa.  EXTREMITIES:  Extremities are warm with no edema.   IMPRESSION:  1. Left ovarian cyst.  2. Chronic pelvic pain.  3. Chronic dysmenorrhea.   PLAN:  The patient is admitted for diagnostic and operative laparoscopy  including a laparoscopic cystectomy, laparoscopic uterosacral nerve ablation  and indicated procedures.  She understands risks, benefits, indications and  alternatives and  will proceed.     ___________________________________________                                         Lazaro Arms, M.D.   Loraine Maple  D:  03/20/2004  T:  03/21/2004  Job:  [id number]

## 2011-03-09 NOTE — Op Note (Signed)
NAME:  Shannon [name], Shannon [name]                            ACCOUNT NO.:  [id number]   MEDICAL RECORD NO.:  [id number]                   PATIENT TYPE:  AMB   LOCATION:  DAY                                  FACILITY:  [facility]   PHYSICIAN:  Lazaro Arms, M.D.                DATE OF BIRTH:  08-20-1981   DATE OF PROCEDURE:  03/21/2004  DATE OF DISCHARGE:                                 OPERATIVE REPORT   PREOPERATIVE DIAGNOSES:  1. Left lower quadrant pain with left ovarian cyst.  2. Dysmenorrhea.  3. Chronic pelvic pain.   POSTOPERATIVE DIAGNOSES:  1. Left lower quadrant pain with left ovarian cyst.  2. Dysmenorrhea.  3. Chronic pelvic pain.   PROCEDURE:  1. Laparoscopic left ovarian cystectomy.  2. Laparoscopic uterosacral nerve ablation.   SURGEON:  Lazaro Arms, M.D.   ANESTHESIA:  General endotracheal.   FINDINGS:  The patient had a relatively small corpus luteum cyst of the left  ovary.  The right ovary appeared to be normal.  There was no endometriosis.  Her uterosacral ligaments were easily demonstrable.  There was no other  evidence of infection or inflammatory disease in the pelvis and again, no  endometriosis.   DESCRIPTION OF PROCEDURE:  The patient was taken to the operating room and  placed in the supine position where she underwent general endotracheal  anesthesia.  She was placed in the dorsal lithotomy position and prepped and  draped in the usual sterile fashion.  Her abdomen was prepped and draped in  the usual sterile fashion, and a Foley catheter was placed.   An incision was made down the umbilicus, and open laparoscopy was performed.  Using the pistol-grip trocar, I dissected down to the peritoneum and then  popped through with the direct visualization pistol-grip.  We then put 5-mm  trocars in the midline and right lower quadrant.  The above-noted findings  were seen.  The uterosacral ligaments were identified.  The ureters were  identified on either side as well  and found to be well out of the way of  dissection.  The uterosacral ligaments were grasped.  The laser was used,  and both of them were transected as they inserted onto the uterus.  There  was no bleeding.  The left ovarian cystectomy was then performed, again  using the laser.  The ovarian surface was opened up, and the contents of the  ovary were removed and sent to pathology.  There was no endometriosis.  There were no adhesions.  Her fimbriae looked normal.  The right ovary was  normal.  She had no adhesive disease.  Again, everything else looked  perfectly fine.  There was good hemostasis.   The patient tolerated the procedure well.  She experienced minimal blood  loss.  She was taken to the recovery room in good and stable condition.  All  counts were correct.  ___________________________________________                                            Lazaro Arms, M.D.   Shannon [name]  D:  03/21/2004  T:  03/21/2004  Job:  [id number]

## 2011-03-09 NOTE — Consult Note (Signed)
NAME:  Shannon [name], Shannon [name]                            ACCOUNT NO.:  [id number]   MEDICAL RECORD NO.:  [id number]                   PATIENT TYPE:  INP   LOCATION:  A325                                 FACILITY:  [facility]   PHYSICIAN:  Hanley Hays. Dechurch, M.D.           DATE OF BIRTH:  14-May-1981   DATE OF CONSULTATION:  DATE OF DISCHARGE:  01/06/2003                                   CONSULTATION   DISCHARGE DIAGNOSES:  1. Acute viral gastroenteritis.  2. Severe odynophagia secondary to recent tonsillectomy.  3. Gastroesophageal reflux.  4. Hypertension.  5. Obesity.   DISPOSITION:  Patient discharged to home.  Follow up with Dr. Lodema Hong in 1  week.  Dr. Isaias Cowman for routine postop follow up.   DISCHARGE INSTRUCTIONS:  1. Restrictions:  Dietary soft diet.  2. The patient is instructed to notify Dr. Lodema Hong if persistent diarrhea or     any new problems develop.   DISCHARGE MEDICATIONS:  1. Cefalexin to complete a 2-week course.  2. Vicodin 1-2 q.4-6h. p.r.n. pain #30.  3. Duke's mixture 5 mL swish and spit 4 times daily as needed.  4. Hurricaine spray 1 spray before meals if needed.  5. Prevacid 30 mg daily.  6. Meridia as directed.  7. Hydrochlorothiazide 25 daily.  8. Oral contraceptive.  Patient to resume new pack today.  9. May use Imodium AD for diarrhea p.r.n.   CONDITION:  Condition at the time of discharge improved.   HOSPITAL COURSE:  The patient is a 30 year old African-American female who  underwent tonsillectomy on 12/31/2002 without complications. He developed,  nausea and vomiting postprocedure associated with fever and subsequently  diarrhea.  She was seen in the emergency room and received IV fluids and  discharged home, but was unable to maintain any p.o.'s including her  antibiotics or pain medications.  She was in significant distress secondary  to pain at the time of presentation.  The patient had no laboratory evidence  of dehydration, though she was  tachycardiac with the heart rate in the 120s.  This improved with IV fluids.   Her fever has defervesced; and she is afebrile at the time of discharge with  a pulse in the 70s; blood pressure is 130/60.  Alert and oriented.  The  oropharynx reveals the edema to be decreased compared with admission.  She  has eschar to the surgical sites, but no evidence of secondary infection or  other abnormalities noted.  She has no significant adenopathy.  Her airway  is patent.  Lungs are clear to auscultation.  Heart is regular.  Abdomen is  obese.  Extremities without clubbing, cyanosis, or edema.  Neurologic  examination is intact.   ASSESSMENT/PLAN:  1. Viral gastroenteritis, resolving.  2. Status post tonsillectomy with postop pain complicated by vomiting,     improving slowly.  Patient was reassured that this will continue to  improve.  She is tolerating a diet without difficulty.  She is being     discharged with the regimen as noted above and follow up as noted above.                                               Hanley Hays Josefine Class, M.D.    FED/MEDQ  D:  01/06/2003  T:  01/06/2003  Job:  [id number]

## 2011-03-12 ENCOUNTER — Other Ambulatory Visit: Payer: Self-pay | Admitting: Family Medicine

## 2011-03-12 ENCOUNTER — Telehealth: Payer: Self-pay | Admitting: Family Medicine

## 2011-03-12 DIAGNOSIS — L709 Acne, unspecified: Secondary | ICD-10-CM

## 2011-03-12 NOTE — Telephone Encounter (Signed)
whatr is the skin prob she has , more info needed

## 2011-03-12 NOTE — Telephone Encounter (Signed)
Facial acne.

## 2011-03-12 NOTE — Telephone Encounter (Signed)
pls refer to derm in [place] or [place] for acne

## 2011-03-13 NOTE — Telephone Encounter (Signed)
pls see referral for derm

## 2011-04-18 ENCOUNTER — Telehealth: Payer: Self-pay | Admitting: Family Medicine

## 2011-04-18 ENCOUNTER — Other Ambulatory Visit: Payer: Self-pay | Admitting: Family Medicine

## 2011-04-18 DIAGNOSIS — L709 Acne, unspecified: Secondary | ICD-10-CM

## 2011-04-18 NOTE — Telephone Encounter (Signed)
Referral entered, pls refer

## 2011-04-18 NOTE — Telephone Encounter (Signed)
Pt has been seen there already and a recent note stated she could f/u as needed, if she actually does need a referral then ok to give

## 2011-04-19 NOTE — Telephone Encounter (Signed)
Faxed over the referral to Dr. Margo Aye office

## 2011-07-24 LAB — URINALYSIS, ROUTINE W REFLEX MICROSCOPIC
Bilirubin Urine: NEGATIVE
Ketones, ur: NEGATIVE
Leukocytes, UA: NEGATIVE
Nitrite: POSITIVE — AB
Specific Gravity, Urine: 1.02
Urobilinogen, UA: 0.2

## 2011-07-24 LAB — HEMOGLOBIN AND HEMATOCRIT, BLOOD: Hemoglobin: 13.1

## 2011-07-24 LAB — URINE CULTURE: Colony Count: >=100000

## 2011-07-24 LAB — PREGNANCY, URINE: Preg Test, Ur: NEGATIVE

## 2011-07-31 ENCOUNTER — Encounter: Payer: Self-pay | Admitting: Family Medicine

## 2011-08-02 ENCOUNTER — Ambulatory Visit: Payer: Self-pay | Admitting: Family Medicine

## 2011-08-02 ENCOUNTER — Encounter: Payer: Self-pay | Admitting: Family Medicine

## 2012-03-28 ENCOUNTER — Other Ambulatory Visit: Payer: Self-pay

## 2012-03-28 ENCOUNTER — Emergency Department (HOSPITAL_COMMUNITY)
Admission: EM | Admit: 2012-03-28 | Discharge: 2012-03-28 | Disposition: A | Discharge status: Home / Self Care | Payer: 59 | Attending: Emergency Medicine | Admitting: Emergency Medicine

## 2012-03-28 ENCOUNTER — Encounter (HOSPITAL_COMMUNITY): Payer: Self-pay | Admitting: *Deleted

## 2012-03-28 ENCOUNTER — Emergency Department (HOSPITAL_COMMUNITY): Payer: 59

## 2012-03-28 DIAGNOSIS — J45909 Unspecified asthma, uncomplicated: Secondary | ICD-10-CM | POA: Insufficient documentation

## 2012-03-28 DIAGNOSIS — R079 Chest pain, unspecified: Secondary | ICD-10-CM | POA: Insufficient documentation

## 2012-03-28 DIAGNOSIS — R091 Pleurisy: Secondary | ICD-10-CM | POA: Insufficient documentation

## 2012-03-28 LAB — BASIC METABOLIC PANEL
Calcium: 9.6 mg/dL (ref 8.4–10.5)
Creatinine, Ser: 0.79 mg/dL (ref 0.50–1.10)
GFR calc Af Amer: >90 mL/min (ref >90)
GFR calc non Af Amer: >90 mL/min (ref >90)

## 2012-03-28 LAB — CBC
MCH: 27.9 pg (ref 26.0–34.0)
MCHC: 33.7 g/dL (ref 30.0–36.0)
MCV: 82.6 fL (ref 78.0–100.0)
Platelets: 167 10*3/uL (ref 150–400)
RDW: 12.7 % (ref 11.5–15.5)

## 2012-03-28 LAB — POCT I-STAT TROPONIN I: Troponin i, poc: 0 ng/mL (ref 0.00–0.08)

## 2012-03-28 MED ORDER — NAPROXEN 500 MG PO TABS: 500.0000 mg | ORAL_TABLET | Freq: Two times a day (BID) | ORAL | Status: AC

## 2012-03-28 MED ORDER — HYDROCODONE-ACETAMINOPHEN 5-325 MG PO TABS: 1.0000 | ORAL_TABLET | Freq: Four times a day (QID) | ORAL | Status: AC | PRN

## 2012-03-28 MED ORDER — MORPHINE SULFATE 4 MG/ML IJ SOLN: 4.0000 mg | Freq: Once | INTRAMUSCULAR | Status: DC

## 2012-03-28 NOTE — Discharge Instructions (Signed)
Pleurisy  Pleurisy is an inflammation and swelling of the lining of the lungs. It usually is the result of an underlying infection or other disease. Because of this inflammation, it hurts to breathe. It is aggravated by coughing or deep breathing. The primary goal in treating pleurisy is to diagnose and treat the condition that caused it.   HOME CARE INSTRUCTIONS    Only take over-the-counter or prescription medicines for pain, discomfort, or fever as directed by your caregiver.   If medications which kill germs (antibiotics) were prescribed, take the entire course. Even if you are feeling better, you need to take them.   Use a cool mist vaporizer to help loosen secretions. This is so the secretions can be coughed up more easily.  SEEK MEDICAL CARE IF:    Your pain is not controlled with medication or is increasing.   You have an increase inpus like (purulent) secretions brought up with coughing.  SEEK IMMEDIATE MEDICAL CARE IF:    You have blue or dark lips, fingernails, or toenails.   You begin coughing up blood.   You have increased difficulty breathing.   You have continuing pain unrelieved by medicine or lasting more than 1 week.   You have pain that radiates into your neck, arms, or jaw.   You develop increased shortness of breath or wheezing.   You develop a fever, rash, vomiting, fainting, or other serious complaints.  Document Released: 10/08/2005 Document Revised: 09/27/2011 Document Reviewed: 05/09/2007  Exit[employer] Patient Information 2012 [employer].

## 2012-03-28 NOTE — ED Provider Notes (Signed)
History   This chart was scribed for Celene Kras, MD by Shari Heritage. The patient was seen in room STRE1/STRE1. Patient's care was started at 1019.     CSN: [id number]  Arrival date & time 03/28/12  1019   First MD Initiated Contact with Patient 03/28/12 1216      Chief Complaint  Patient presents with  . Chest Pain    The history is provided by the patient. No language interpreter was used.   Shannon [name] is a 31 y.o. female who presents to the Emergency Department complaining of moderate to severe, constant chest pain onset 2 days ago with associated difficulty breathing.  Patient describes pain as "shooting" and also experiences tightness in chest. Patient says that deep breaths worsen the pain. Patient took an Aspirin last night, but is still experienced pain after. Patient with family h/o of heart problems. Patient thinks that her mother may have had an MI before, but is unsure.  Patient says that she has been mostly ignoring it, but after describing symptoms to her PCP, her PCP told her to go to the ED.  Patient hasn't taken any long trips recently. Patient with h/o of asthma. Patient has never smoked.  Past Medical History  Diagnosis Date  . Asthma     Past Surgical History  Procedure Date  . Tonsils and adnoids removed     Family History  Problem Relation Age of Onset  . Hypertension Mother   . Diabetes Mother   . Mental illness Mother   . Hypertension Father   . Asthma Brother   . Heart disease Maternal Grandmother   . Hypertension Maternal Grandmother   . Cancer Paternal Grandmother     breast cancer    History  Substance Use Topics  . Smoking status: Never Smoker   . Smokeless tobacco: Not on file  . Alcohol Use: No    OB History    Grav Para Term Preterm Abortions TAB SAB Ect Mult Living                  Review of Systems A complete 10 system review of systems was obtained and all systems are negative except as noted in the HPI and PMH.   Allergies    Ceftriaxone sodium; Diflucan; Eggs or egg-derived products; and Latex  Home Medications   Current Outpatient Rx  Name Route Sig Dispense Refill  . ALBUTEROL SULFATE HFA 108 (90 BASE) MCG/ACT IN AERS Inhalation Inhale 2 puffs into the lungs every 6 (six) hours as needed. For shortness of breath.      BP 119/77  Pulse 64  Temp(Src) 97.7 F (36.5 C) (Oral)  Resp 15  Ht 5' 4.5" (1.638 m)  Wt 165 lb (74.844 kg)  BMI 27.88 kg/m2  SpO2 100%  LMP 03/28/2012  Physical Exam  Nursing note and vitals reviewed. Constitutional: She appears well-developed and well-nourished. No distress.  HENT:  Head: Normocephalic and atraumatic.  Right Ear: External ear normal.  Left Ear: External ear normal.  Eyes: Conjunctivae are normal. Right eye exhibits no discharge. Left eye exhibits no discharge. No scleral icterus.  Neck: Neck supple. No tracheal deviation present.  Cardiovascular: Normal rate, regular rhythm and intact distal pulses.   Pulmonary/Chest: Effort normal and breath sounds normal. No stridor. No respiratory distress. She has no wheezes. She has no rales. She exhibits no tenderness (No tenderness to palpation.).  Abdominal: Soft. Bowel sounds are normal. She exhibits no distension. There is no tenderness.  There is no rebound and no guarding.  Musculoskeletal: She exhibits no edema and no tenderness (No calf tenderness).  Neurological: She is alert. She has normal strength. No sensory deficit. Cranial nerve deficit:  no gross defecits noted. She exhibits normal muscle tone. She displays no seizure activity. Coordination normal.  Skin: Skin is warm and dry. No rash noted.  Psychiatric: She has a normal mood and affect.    ED Course  Procedures (including critical care time)  DIAGNOSTIC STUDIES: Oxygen Saturation is 100% on room air, normal by my interpretation.    COORDINATION OF CARE: 12:30PM - Patient informed of current plan for treatment and evaluation and agrees with plan at  this time.      Rate: 81  Rhythm: normal sinus rhythm  QRS Axis: normal  Intervals: normal  ST/T Wave abnormalities: normal  Conduction Disutrbances:none  Narrative Interpretation: Normal  Old EKG Reviewed: No changes  Labs Reviewed  CBC - Abnormal; Notable for the following:    WBC 3.4 (*)    All other components within normal limits  BASIC METABOLIC PANEL  POCT I-STAT TROPONIN I  D-DIMER, QUANTITATIVE   Dg Chest 2 View  03/28/2012  *RADIOLOGY REPORT*  Clinical Data: Chest pain.  History of asthma.  CHEST - 2 VIEW  Comparison: 08/21/2010.  Findings: No infiltrate, congestive heart failure or pneumothorax. Minimal peribronchial thickening stable.  Heart size within normal limits.  IMPRESSION: No acute abnormality.  Original Report Authenticated By: Fuller Canada, M.D.     1. Pleurisy       MDM  The patient's symptoms are consistent with a viral pleurisy. I doubt acute coronary syndrome. She has no evidence on x-ray to suggest pneumothorax, pneumonia or aortic dissection. She is low risk for PE and has a negative d-dimer.     I personally performed the services described in this documentation, which was scribed in my presence.  The recorded information has been reviewed and considered.    Celene Kras, MD 03/28/12 [phone]

## 2012-03-28 NOTE — ED Notes (Signed)
Patient reports onset of chest pain last night that continued today.  She states she is also having right sided jaw pain.  Patient states she was short of breath last night.

## 2012-04-28 ENCOUNTER — Other Ambulatory Visit: Payer: Self-pay | Admitting: Obstetrics & Gynecology

## 2012-04-28 ENCOUNTER — Other Ambulatory Visit (HOSPITAL_COMMUNITY)
Admission: RE | Admit: 2012-04-28 | Discharge: 2012-04-28 | Disposition: A | Discharge status: Home / Self Care | Payer: 59 | Source: Ambulatory Visit | Attending: Obstetrics & Gynecology | Admitting: Obstetrics & Gynecology

## 2012-04-28 DIAGNOSIS — Z01419 Encounter for gynecological examination (general) (routine) without abnormal findings: Secondary | ICD-10-CM | POA: Insufficient documentation

## 2012-11-20 ENCOUNTER — Encounter: Payer: Self-pay | Admitting: Family Medicine

## 2012-11-20 ENCOUNTER — Ambulatory Visit (INDEPENDENT_AMBULATORY_CARE_PROVIDER_SITE_OTHER): Payer: BC Managed Care – PPO | Admitting: Family Medicine

## 2012-11-20 VITALS — BP 120/82 | HR 96 | Temp 98.9°F | Resp 16 | Wt 176.8 lb

## 2012-11-20 DIAGNOSIS — E663 Overweight: Secondary | ICD-10-CM

## 2012-11-20 DIAGNOSIS — R7302 Impaired glucose tolerance (oral): Secondary | ICD-10-CM

## 2012-11-20 DIAGNOSIS — R7301 Impaired fasting glucose: Secondary | ICD-10-CM

## 2012-11-20 DIAGNOSIS — Z139 Encounter for screening, unspecified: Secondary | ICD-10-CM

## 2012-11-20 DIAGNOSIS — E785 Hyperlipidemia, unspecified: Secondary | ICD-10-CM

## 2012-11-20 DIAGNOSIS — R7309 Other abnormal glucose: Secondary | ICD-10-CM

## 2012-11-20 DIAGNOSIS — R5383 Other fatigue: Secondary | ICD-10-CM

## 2012-11-20 DIAGNOSIS — J45909 Unspecified asthma, uncomplicated: Secondary | ICD-10-CM

## 2012-11-20 DIAGNOSIS — J209 Acute bronchitis, unspecified: Secondary | ICD-10-CM

## 2012-11-20 DIAGNOSIS — E559 Vitamin D deficiency, unspecified: Secondary | ICD-10-CM

## 2012-11-20 DIAGNOSIS — D649 Anemia, unspecified: Secondary | ICD-10-CM

## 2012-11-20 DIAGNOSIS — R5381 Other malaise: Secondary | ICD-10-CM

## 2012-11-20 DIAGNOSIS — J0100 Acute maxillary sinusitis, unspecified: Secondary | ICD-10-CM | POA: Insufficient documentation

## 2012-11-20 LAB — CBC WITH DIFFERENTIAL/PLATELET
Eosinophils Absolute: 0.1 10*3/uL (ref 0.0–0.7)
Eosinophils Relative: 5 % (ref 0–5)
Hemoglobin: 13.1 g/dL (ref 12.0–15.0)
Lymphocytes Relative: 56 % — ABNORMAL HIGH (ref 12–46)
Lymphs Abs: 1.7 10*3/uL (ref 0.7–4.0)
MCH: 27.4 pg (ref 26.0–34.0)
MCV: 81 fL (ref 78.0–100.0)
Monocytes Relative: 10 % (ref 3–12)
Neutrophils Relative %: 28 % — ABNORMAL LOW (ref 43–77)
Platelets: 189 10*3/uL (ref 150–400)
RBC: 4.78 MIL/uL (ref 3.87–5.11)
WBC: 3 10*3/uL — ABNORMAL LOW (ref 4.0–10.5)

## 2012-11-20 MED ORDER — BENZONATATE 100 MG PO CAPS: 100.0000 mg | ORAL_CAPSULE | Freq: Three times a day (TID) | ORAL | Status: DC | PRN

## 2012-11-20 MED ORDER — SULFAMETHOXAZOLE-TRIMETHOPRIM 800-160 MG PO TABS: 1.0000 | ORAL_TABLET | Freq: Two times a day (BID) | ORAL | Status: DC

## 2012-11-20 MED ORDER — SULFAMETHOXAZOLE-TRIMETHOPRIM 800-160 MG PO TABS: 1.0000 | ORAL_TABLET | Freq: Two times a day (BID) | ORAL | Status: AC

## 2012-11-20 NOTE — Patient Instructions (Addendum)
F/u as needed.Call if no better in the next 2 weeks  You are being treated for acute sinusitis and bronchitis. Septra, an antibiotic and decongestant tablets are sent to your pharmacy.  Use sudafed one daily for the next 5 days to reduce the drainage from the nostrils and the pressure in your ears. Saline flushes with a nettie pot 2 to 3 times daily for 10 to 14 days.  Labs today, cBC and diff,cmp, HBa1C, tSH, Vit D, lipids, B12   It is important that you exercise regularly at least 30 minutes 5 times a week. If you develop chest pain, have severe difficulty breathing, or feel very tired, stop exercising immediately and seek medical attention   A healthy diet is rich in fruit, vegetables and whole grains. Poultry fish, nuts and beans are a healthy choice for protein rather then red meat. A low sodium diet and drinking 64 ounces of water daily is generally recommended. Oils and sweet should be limited. Carbohydrates especially for those who are diabetic or overweight, should be limited to 30-45 gram per meal. It is important to eat on a regular schedule, at least 3 times daily. Snacks should be primarily fruits, vegetables or nuts.

## 2012-11-20 NOTE — Progress Notes (Signed)
  Subjective:    Patient ID: Shannon [name], female    DOB: 07/20/1981, 32 y.o.   MRN: [id number]  HPI 2 week h/o head and chest congestion which is progressively worsening, yellow nasal drainage,sputum is yellow, has had intermittent chills and fever up to 103.9 on Monday when she was sent home from work, called to be seen her on that day, unable to be seen here, went to [facility],  Had a CXr, discharged on cough suppressant and had received a shot for generalized pain and nausea. States she has generally not been feeling well, wants to be tested specifically for B12 deficiency, states her father had this   Review of Systems See HPI Denies chest pains, palpitations and leg swelling Denies abdominal pain, nausea, vomiting,diarrhea or constipation.   Denies dysuria, frequency, hesitancy or incontinence. Denies joint pain, swelling and limitation in mobility. Denies  seizures, numbness, or tingling.c/o headache and ear pressure Denies depression, anxiety or insomnia. Denies skin break down or rash.        Objective:   Physical Exam Patient alert and oriented and in no cardiopulmonary distress.Ill appearing  HEENT: No facial asymmetry, EOMI, frontal and maxillary  sinus tenderness,  oropharynx pink and moist.No exudate  Neck supple bilateral anterior cervical adenopathy.TM dull reduced light reflex  Chest: adequate air entry, bilateral crackles no  wheezes  CVS: S1, S2 no murmurs, no S3.  ABD: Soft non tender. Bowel sounds normal.  Ext: No edema  MS: Adequate ROM spine, shoulders, hips and knees.  Skin: Intact, no ulcerations or rash noted.  Psych: Good eye contact, normal affect. Memory intact not anxious or depressed appearing.  CNS: CN 2-12 intact, power, tone and sensation normal throughout.        Assessment & Plan:

## 2012-11-21 DIAGNOSIS — E559 Vitamin D deficiency, unspecified: Secondary | ICD-10-CM | POA: Insufficient documentation

## 2012-11-21 LAB — COMPREHENSIVE METABOLIC PANEL
ALT: 10 U/L (ref 0–35)
CO2: 29 mEq/L (ref 19–32)
Calcium: 9.4 mg/dL (ref 8.4–10.5)
Chloride: 104 mEq/L (ref 96–112)
Glucose, Bld: 76 mg/dL (ref 70–99)
Sodium: 141 mEq/L (ref 135–145)
Total Bilirubin: 0.2 mg/dL — ABNORMAL LOW (ref 0.3–1.2)
Total Protein: 6.3 g/dL (ref 6.0–8.3)

## 2012-11-21 LAB — LIPID PANEL
LDL Cholesterol: 107 mg/dL — ABNORMAL HIGH (ref 0–99)
Total CHOL/HDL Ratio: 2.9 Ratio
VLDL: 15 mg/dL (ref 0–40)

## 2012-11-21 LAB — HEMOGLOBIN A1C
Hgb A1c MFr Bld: 5.7 % — ABNORMAL HIGH (ref <5.7)
Mean Plasma Glucose: 117 mg/dL — ABNORMAL HIGH (ref <117)

## 2012-11-21 LAB — VITAMIN B12: Vitamin B-12: 617 pg/mL (ref 211–911)

## 2012-11-23 ENCOUNTER — Telehealth: Payer: Self-pay | Admitting: Family Medicine

## 2012-11-23 DIAGNOSIS — R7302 Impaired glucose tolerance (oral): Secondary | ICD-10-CM | POA: Insufficient documentation

## 2012-11-23 DIAGNOSIS — E785 Hyperlipidemia, unspecified: Secondary | ICD-10-CM

## 2012-11-23 DIAGNOSIS — E559 Vitamin D deficiency, unspecified: Secondary | ICD-10-CM

## 2012-11-23 NOTE — Assessment & Plan Note (Signed)
Weekly supplements recommended x 6 month

## 2012-11-23 NOTE — Telephone Encounter (Signed)
When discussing labs with the pt, pls let her know it is important she work on lifestyle change to improve her health, also needs to take the vit D prescribed. I recommend strongly she schedule and keep 6 month follow up with labs drawn before visit, HBA1C, fasting lipid and vit D, pls order them and let her know

## 2012-11-23 NOTE — Assessment & Plan Note (Signed)
Pt to be educated re the need to focus on change in diet and weight loss to improve health

## 2012-11-23 NOTE — Assessment & Plan Note (Signed)
Acute chest congestion, decongestants prescribed

## 2012-11-23 NOTE — Assessment & Plan Note (Signed)
Acute infection antibiotic prescribed 

## 2012-11-23 NOTE — Assessment & Plan Note (Signed)
Deteriorated. Patient re-educated about  the importance of commitment to a  minimum of 150 minutes of exercise per week. The importance of healthy food choices with portion control discussed. Encouraged to start a food diary, count calories and to consider  joining a support group. Sample diet sheets offered. Goals set by the patient for the next several months.    

## 2012-11-23 NOTE — Assessment & Plan Note (Signed)
Low fat diet encouraged, LDL slightly elevated, pt to be called with result

## 2012-11-26 ENCOUNTER — Other Ambulatory Visit: Payer: Self-pay

## 2012-11-26 DIAGNOSIS — E559 Vitamin D deficiency, unspecified: Secondary | ICD-10-CM

## 2012-11-26 MED ORDER — ERGOCALCIFEROL 1.25 MG (50000 UT) PO CAPS: 50000.0000 [IU] | ORAL_CAPSULE | ORAL | Status: DC

## 2012-11-28 ENCOUNTER — Other Ambulatory Visit: Payer: Self-pay

## 2012-11-28 DIAGNOSIS — E559 Vitamin D deficiency, unspecified: Secondary | ICD-10-CM

## 2012-11-28 MED ORDER — ERGOCALCIFEROL 1.25 MG (50000 UT) PO CAPS: 50000.0000 [IU] | ORAL_CAPSULE | ORAL | Status: DC

## 2012-11-28 NOTE — Telephone Encounter (Signed)
Called left message for patient to return call before 5.

## 2012-12-01 ENCOUNTER — Telehealth: Payer: Self-pay | Admitting: Family Medicine

## 2012-12-03 NOTE — Telephone Encounter (Signed)
Patient aware and will call back for appointment.  Labs to be mailed to her.

## 2012-12-03 NOTE — Addendum Note (Signed)
Addended by: Kandis Fantasia B on: 12/03/2012 08:44 AM   Modules accepted: Orders

## 2012-12-15 ENCOUNTER — Telehealth: Payer: Self-pay | Admitting: Genetic Counselor

## 2012-12-15 NOTE — Telephone Encounter (Signed)
S/W pt in re genetic appt on 4/28 @ 10 w/Karen Lowell [employer].  Referring Dr.Cousins Dx- Genetic Appt  Welcome packet mailed.

## 2012-12-17 NOTE — Telephone Encounter (Signed)
Concern addressed

## 2013-01-15 ENCOUNTER — Telehealth: Payer: Self-pay | Admitting: Family Medicine

## 2013-01-16 NOTE — Telephone Encounter (Signed)
Pt needed to schedule appt to discuss leave from work

## 2013-01-19 ENCOUNTER — Encounter: Payer: Self-pay | Admitting: Family Medicine

## 2013-01-19 ENCOUNTER — Ambulatory Visit (INDEPENDENT_AMBULATORY_CARE_PROVIDER_SITE_OTHER): Payer: BC Managed Care – PPO | Admitting: Family Medicine

## 2013-01-19 VITALS — BP 118/76 | HR 94 | Resp 18 | Ht 64.5 in | Wt 182.1 lb

## 2013-01-19 DIAGNOSIS — R7309 Other abnormal glucose: Secondary | ICD-10-CM

## 2013-01-19 DIAGNOSIS — E559 Vitamin D deficiency, unspecified: Secondary | ICD-10-CM

## 2013-01-19 DIAGNOSIS — F329 Major depressive disorder, single episode, unspecified: Secondary | ICD-10-CM

## 2013-01-19 DIAGNOSIS — R7302 Impaired glucose tolerance (oral): Secondary | ICD-10-CM

## 2013-01-19 DIAGNOSIS — E785 Hyperlipidemia, unspecified: Secondary | ICD-10-CM

## 2013-01-19 DIAGNOSIS — F32A Depression, unspecified: Secondary | ICD-10-CM | POA: Insufficient documentation

## 2013-01-19 DIAGNOSIS — J45909 Unspecified asthma, uncomplicated: Secondary | ICD-10-CM

## 2013-01-19 MED ORDER — FLUOXETINE HCL 10 MG PO TABS: 10.0000 mg | ORAL_TABLET | Freq: Every day | ORAL | Status: DC

## 2013-01-19 NOTE — Progress Notes (Signed)
  Subjective:    Patient ID: Shannon [name], female    DOB: Jul 30, 1981, 32 y.o.   MRN: [id number] Ct that  HPI Pt in with c/o severe depression , preventing her fom working at this time. She recently discovered , along with her fiancee, that his daughter had a baby which had been put in foster care because they did not know about the baby. Shannon [name] and the child's grand father, her fiancee are now in the process of adopting th baby, the mother is incarcerated. Shannon [name] is overwhelmed, due tpo the circumstances which pushed her into this situation as well as  The fact that she has now become responsible for an unplanned infant Formal depression screen is as documented, she is not suicidal or homicidal, but extremely overwhelmed to the extent it is negatively affecting her work and ability to carry out day to day activities, states this has been noticed by her supervisor also Has benefited in the past from medication and wants all the help available to get her back on her feet   Review of Systems See HPI Denies recent fever or chills. Denies sinus pressure, nasal congestion, ear pain or sore throat. Denies chest congestion, productive cough or wheezing. Denies chest pains, palpitations and leg swelling Denies abdominal pain, nausea, vomiting,diarrhea or constipation.   Denies dysuria, frequency, hesitancy or incontinence. Denies joint pain, swelling and limitation in mobility. Denies headaches, seizures, numbness, or tingling.  Denies skin break down or rash.        Objective:   Physical Exam  Patient alert and oriented and in no cardiopulmonary distress.  HEENT: No facial asymmetry, EOMI, no sinus tenderness,  oropharynx pink and moist.  Neck supple no adenopathy.  Chest: Clear to auscultation bilaterally.  CVS: S1, S2 no murmurs, no S3.  ABD: Soft non tender. Bowel sounds normal.  Ext: No edema  MS: Adequate ROM spine, shoulders, hips and knees.  Skin: Intact, no ulcerations or rash  noted.  Psych: Good eye contact, . Memory intact  anxious tearful and  depressed appearing.  CNS: CN 2-12 intact, power, tone and sensation normal throughout.       Assessment & Plan:

## 2013-01-19 NOTE — Patient Instructions (Addendum)
F/u in 6 to 8 weeks.  You are referred for therapy, and also started on fluoxetine daily   Work excuse for 1 week, return January 26, 2013  Please continue weekly vitamin D

## 2013-01-22 ENCOUNTER — Encounter (HOSPITAL_COMMUNITY): Payer: Self-pay | Admitting: Psychiatry

## 2013-01-22 ENCOUNTER — Ambulatory Visit (INDEPENDENT_AMBULATORY_CARE_PROVIDER_SITE_OTHER): Payer: BC Managed Care – PPO | Admitting: Psychiatry

## 2013-01-22 DIAGNOSIS — F4323 Adjustment disorder with mixed anxiety and depressed mood: Secondary | ICD-10-CM

## 2013-01-25 NOTE — Assessment & Plan Note (Signed)
Controlled, no change in medication  

## 2013-01-25 NOTE — Assessment & Plan Note (Signed)
Weekly supplement to be continued 

## 2013-01-25 NOTE — Assessment & Plan Note (Signed)
Low fat diet discussed and encouraged 

## 2013-01-25 NOTE — Assessment & Plan Note (Signed)
Patient educated about the importance of limiting  Carbohydrate intake , the need to commit to daily physical activity for a minimum of 30 minutes , and to commit weight loss. The fact that changes in all these areas will reduce or eliminate all together the development of diabetes is stressed.    

## 2013-01-25 NOTE — Assessment & Plan Note (Signed)
Moderately severe, pt currently unable to work. Needs to start medication and also referred for counselling

## 2013-01-29 NOTE — Patient Instructions (Signed)
Discussed orally 
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
## 2013-01-30 ENCOUNTER — Ambulatory Visit (HOSPITAL_COMMUNITY): Payer: Self-pay | Admitting: Psychiatry

## 2013-02-10 ENCOUNTER — Ambulatory Visit (HOSPITAL_COMMUNITY): Payer: Self-pay | Admitting: Psychiatry

## 2013-02-16 ENCOUNTER — Other Ambulatory Visit: Payer: BC Managed Care – PPO | Admitting: Lab

## 2013-02-16 ENCOUNTER — Encounter: Payer: BC Managed Care – PPO | Admitting: Genetic Counselor

## 2013-03-23 ENCOUNTER — Ambulatory Visit: Payer: BC Managed Care – PPO | Admitting: Family Medicine

## 2013-04-12 ENCOUNTER — Encounter (HOSPITAL_COMMUNITY): Payer: Self-pay | Admitting: *Deleted

## 2013-04-12 ENCOUNTER — Emergency Department (HOSPITAL_COMMUNITY)
Admission: EM | Admit: 2013-04-12 | Discharge: 2013-04-12 | Disposition: A | Discharge status: Home / Self Care | Payer: BC Managed Care – PPO | Attending: Emergency Medicine | Admitting: Emergency Medicine

## 2013-04-12 ENCOUNTER — Emergency Department (HOSPITAL_COMMUNITY): Payer: BC Managed Care – PPO

## 2013-04-12 DIAGNOSIS — Z8679 Personal history of other diseases of the circulatory system: Secondary | ICD-10-CM | POA: Insufficient documentation

## 2013-04-12 DIAGNOSIS — R42 Dizziness and giddiness: Secondary | ICD-10-CM | POA: Insufficient documentation

## 2013-04-12 DIAGNOSIS — Z91040 Latex allergy status: Secondary | ICD-10-CM | POA: Insufficient documentation

## 2013-04-12 DIAGNOSIS — Z79899 Other long term (current) drug therapy: Secondary | ICD-10-CM | POA: Insufficient documentation

## 2013-04-12 DIAGNOSIS — R002 Palpitations: Secondary | ICD-10-CM | POA: Insufficient documentation

## 2013-04-12 DIAGNOSIS — R071 Chest pain on breathing: Secondary | ICD-10-CM | POA: Insufficient documentation

## 2013-04-12 DIAGNOSIS — R000 Tachycardia, unspecified: Secondary | ICD-10-CM | POA: Insufficient documentation

## 2013-04-12 DIAGNOSIS — R0602 Shortness of breath: Secondary | ICD-10-CM | POA: Insufficient documentation

## 2013-04-12 DIAGNOSIS — R5381 Other malaise: Secondary | ICD-10-CM | POA: Insufficient documentation

## 2013-04-12 DIAGNOSIS — J45909 Unspecified asthma, uncomplicated: Secondary | ICD-10-CM | POA: Insufficient documentation

## 2013-04-12 DIAGNOSIS — R0789 Other chest pain: Secondary | ICD-10-CM

## 2013-04-12 HISTORY — DX: Cardiac arrhythmia, unspecified: I49.9

## 2013-04-12 LAB — COMPREHENSIVE METABOLIC PANEL
ALT: 7 U/L (ref 0–35)
AST: 12 U/L (ref 0–37)
Alkaline Phosphatase: 54 U/L (ref 39–117)
CO2: 27 mEq/L (ref 19–32)
Calcium: 9.2 mg/dL (ref 8.4–10.5)
GFR calc non Af Amer: 76 mL/min — ABNORMAL LOW (ref >90)
Potassium: 3.3 mEq/L — ABNORMAL LOW (ref 3.5–5.1)
Sodium: 140 mEq/L (ref 135–145)

## 2013-04-12 LAB — CBC WITH DIFFERENTIAL/PLATELET
Basophils Absolute: 0 10*3/uL (ref 0.0–0.1)
Eosinophils Relative: 3 % (ref 0–5)
Lymphocytes Relative: 35 % (ref 12–46)
Lymphs Abs: 1.7 10*3/uL (ref 0.7–4.0)
Neutro Abs: 2.6 10*3/uL (ref 1.7–7.7)
Neutrophils Relative %: 53 % (ref 43–77)
Platelets: 186 10*3/uL (ref 150–400)
RBC: 4.32 MIL/uL (ref 3.87–5.11)
RDW: 12.6 % (ref 11.5–15.5)
WBC: 4.9 10*3/uL (ref 4.0–10.5)

## 2013-04-12 MED ORDER — CYCLOBENZAPRINE HCL 10 MG PO TABS
10.0000 mg | ORAL_TABLET | Freq: Once | ORAL | Status: AC
  Administered 2013-04-12: 10 mg via ORAL
  Filled 2013-04-12: qty 1

## 2013-04-12 MED ORDER — IOHEXOL 350 MG/ML SOLN
100.0000 mL | Freq: Once | INTRAVENOUS | Status: AC | PRN
  Administered 2013-04-12: 100 mL via INTRAVENOUS

## 2013-04-12 MED ORDER — KETOROLAC TROMETHAMINE 30 MG/ML IJ SOLN
30.0000 mg | Freq: Once | INTRAMUSCULAR | Status: AC
  Administered 2013-04-12: 30 mg via INTRAVENOUS
  Filled 2013-04-12: qty 1

## 2013-04-12 MED ORDER — ACETAMINOPHEN 500 MG PO TABS
1000.0000 mg | ORAL_TABLET | Freq: Once | ORAL | Status: AC
  Administered 2013-04-12: 1000 mg via ORAL
  Filled 2013-04-12: qty 2

## 2013-04-12 MED ORDER — SODIUM CHLORIDE 0.9 % IV SOLN
INTRAVENOUS | Status: DC
  Administered 2013-04-12: 16:00:00 via INTRAVENOUS

## 2013-04-12 MED ORDER — HYDROCODONE-ACETAMINOPHEN 5-325 MG PO TABS: ORAL_TABLET | ORAL | Status: DC

## 2013-04-12 MED ORDER — TRAMADOL HCL 50 MG PO TABS
100.0000 mg | ORAL_TABLET | Freq: Once | ORAL | Status: AC
  Administered 2013-04-12: 100 mg via ORAL
  Filled 2013-04-12: qty 2

## 2013-04-12 MED ORDER — SODIUM CHLORIDE 0.9 % IV BOLUS (SEPSIS)
1000.0000 mL | Freq: Once | INTRAVENOUS | Status: AC
  Administered 2013-04-12: 1000 mL via INTRAVENOUS

## 2013-04-12 MED ORDER — METHOCARBAMOL 500 MG PO TABS: ORAL_TABLET | ORAL | Status: DC

## 2013-04-12 MED ORDER — MORPHINE SULFATE 4 MG/ML IJ SOLN
4.0000 mg | Freq: Once | INTRAMUSCULAR | Status: AC
  Administered 2013-04-12: 4 mg via INTRAVENOUS
  Filled 2013-04-12: qty 1

## 2013-04-12 NOTE — ED Provider Notes (Addendum)
History    This chart was scribed for Ward Givens, MD by Leone Payor, ED Scribe. This patient was seen in room [facility] and the patient's care was started 3:17 PM.   CSN: [id number]  Arrival date & time 04/12/13  1431   First MD Initiated Contact with Patient 04/12/13 1454      Chief Complaint  Patient presents with  . Chest Pain     The history is provided by the patient. No language interpreter was used.    HPI Comments: Shannon [name] is a 32 y.o. female who presents to the Emergency Department complaining of R sided chest pain starting 2-3 weeks ago that became constant in the past 4 days.She thought it was due to stress so ignored it. She describes the pain as heaviness and pressure. Nothing alleviates the pain. Nothing makes it feel worse but it was tender to palpation and deep breathing during the exam made it hurt more. She denies coughing or  fever. She keeps getting clammy and hot and had nausea last night. She reports having "a tingle under my tongue" last night. States her R arm has been going numb and feels heavy when trying to lift it. States she started getting SOB yesterday along with some lightheadedness. Has been eating and drinking normally but has been feeling more tired lately. Denies having pain or swelling in legs. States her current pain is an 8/10 and at it's worse it was an 10/10.  States she is tired and exhausted but has been eating okay.   Pt's mother has history of ?  heart problems and DVT.    PCP Dr. Lodema Hong  Past Medical History  Diagnosis Date  . Asthma   . Irregular heart beat     Past Surgical History  Procedure Laterality Date  . Tonsils and adnoids removed      Family History  Problem Relation Age of Onset  . Hypertension Mother   . Diabetes Mother   . Mental illness Mother   . Hypertension Father   . Asthma Brother   . Heart disease Maternal Grandmother   . Hypertension Maternal Grandmother   . Cancer Paternal Grandmother     breast  cancer    History  Substance Use Topics  . Smoking status: Never Smoker   . Smokeless tobacco: Never Used  . Alcohol Use: No  employed  OB History   Grav Para Term Preterm Abortions TAB SAB Ect Mult Living                  Review of Systems  Constitutional: Negative for fever.  Respiratory: Positive for shortness of breath. Negative for cough.   Cardiovascular: Positive for chest pain. Negative for leg swelling.  Neurological: Positive for weakness (R arm) and light-headedness.  All other systems reviewed and are negative.    Allergies  Ceftriaxone sodium; Diflucan; Eggs or egg-derived products; and Latex  Home Medications   Current Outpatient Rx  Name  Route  Sig  Dispense  Refill  . albuterol (PROVENTIL HFA;VENTOLIN HFA) 108 (90 BASE) MCG/ACT inhaler   Inhalation   Inhale 2 puffs into the lungs every 6 (six) hours as needed. For shortness of breath.           BP 126/95  Pulse 110  Temp(Src) 98.8 F (37.1 C) (Oral)  Resp 21  Ht 5' 4.5" (1.638 m)  Wt 178 lb (80.74 kg)  BMI 30.09 kg/m2  SpO2 100%  LMP 04/07/2013  Vital signs  normal except tachycardia   Physical Exam  Nursing note and vitals reviewed. Constitutional: She is oriented to person, place, and time. She appears well-developed and well-nourished.  Non-toxic appearance. She does not appear ill. No distress.  HENT:  Head: Normocephalic and atraumatic.  Right Ear: External ear normal.  Left Ear: External ear normal.  Nose: Nose normal. No mucosal edema or rhinorrhea.  Mouth/Throat: Oropharynx is clear and moist and mucous membranes are normal. No dental abscesses or edematous.  Eyes: Conjunctivae and EOM are normal. Pupils are equal, round, and reactive to light.  Neck: Normal range of motion and full passive range of motion without pain. Neck supple.  Cardiovascular: Regular rhythm and normal heart sounds.  Tachycardia present.  Exam reveals no gallop and no friction rub.   No murmur  heard. Pulmonary/Chest: Effort normal and breath sounds normal. No respiratory distress. She has no wheezes. She has no rhonchi. She has no rales. She exhibits no tenderness and no crepitus.    Tender to R upper chest wall.   Abdominal: Soft. Normal appearance and bowel sounds are normal. She exhibits no distension. There is no tenderness. There is no rebound and no guarding.  Musculoskeletal: Normal range of motion. She exhibits no edema and no tenderness.  Moves all extremities well.   Neurological: She is alert and oriented to person, place, and time. She has normal strength. No cranial nerve deficit.  Skin: Skin is warm, dry and intact. No rash noted. No erythema. No pallor.  Psychiatric: She has a normal mood and affect. Her speech is normal and behavior is normal. Her mood appears not anxious.    ED Course  Procedures (including critical care time)  Medications  0.9 %  sodium chloride infusion ( Intravenous New Bag/Given 04/12/13 1558)  ketorolac (TORADOL) 30 MG/ML injection 30 mg (30 mg Intravenous Given 04/12/13 1558)  cyclobenzaprine (FLEXERIL) tablet 10 mg (10 mg Oral Given 04/12/13 1715)  sodium chloride 0.9 % bolus 1,000 mL (0 mLs Intravenous Stopped 04/12/13 1957)  traMADol (ULTRAM) tablet 100 mg (100 mg Oral Given 04/12/13 1715)  acetaminophen (TYLENOL) tablet 1,000 mg (1,000 mg Oral Given 04/12/13 1715)  morphine 4 MG/ML injection 4 mg (4 mg Intravenous Given 04/12/13 1805)  iohexol (OMNIPAQUE) 350 MG/ML injection 100 mL (100 mLs Intravenous Contrast Given 04/12/13 1834)     DIAGNOSTIC STUDIES: Oxygen Saturation is 100% on RA, normal by my interpretation.    COORDINATION OF CARE: 3:43 PM Discussed treatment plan with pt at bedside and pt agreed to plan.   Patient's chest pain was hard to get relief. Finally morphine did relieve her pain. Although her d-dimer was normal CT and your chest was done because of her persistent pain. Patient was relieved of her tests were  normal.  Results for orders placed during the hospital encounter of 04/12/13  CBC WITH DIFFERENTIAL      Result Value Range   WBC 4.9  4.0 - 10.5 K/uL   RBC 4.32  3.87 - 5.11 MIL/uL   Hemoglobin 12.1  12.0 - 15.0 g/dL   HCT 16.1 (*) 09.6 - 04.5 %   MCV 82.4  78.0 - 100.0 fL   MCH 28.0  26.0 - 34.0 pg   MCHC 34.0  30.0 - 36.0 g/dL   RDW 40.9  81.1 - 91.4 %   Platelets 186  150 - 400 K/uL   Neutrophils Relative % 53  43 - 77 %   Neutro Abs 2.6  1.7 - 7.7 K/uL  Lymphocytes Relative 35  12 - 46 %   Lymphs Abs 1.7  0.7 - 4.0 K/uL   Monocytes Relative 9  3 - 12 %   Monocytes Absolute 0.5  0.1 - 1.0 K/uL   Eosinophils Relative 3  0 - 5 %   Eosinophils Absolute 0.1  0.0 - 0.7 K/uL   Basophils Relative 0  0 - 1 %   Basophils Absolute 0.0  0.0 - 0.1 K/uL  COMPREHENSIVE METABOLIC PANEL      Result Value Range   Sodium 140  135 - 145 mEq/L   Potassium 3.3 (*) 3.5 - 5.1 mEq/L   Chloride 103  96 - 112 mEq/L   CO2 27  19 - 32 mEq/L   Glucose, Bld 93  70 - 99 mg/dL   BUN 15  6 - 23 mg/dL   Creatinine, Ser 1.61  0.50 - 1.10 mg/dL   Calcium 9.2  8.4 - 09.6 mg/dL   Total Protein 6.4  6.0 - 8.3 g/dL   Albumin 3.3 (*) 3.5 - 5.2 g/dL   AST 12  0 - 37 U/L   ALT 7  0 - 35 U/L   Alkaline Phosphatase 54  39 - 117 U/L   Total Bilirubin 0.3  0.3 - 1.2 mg/dL   GFR calc non Af Amer 76 (*) >90 mL/min   GFR calc Af Amer 88 (*) >90 mL/min  TROPONIN I      Result Value Range   Troponin I <0.30  <0.30 ng/mL  D-DIMER, QUANTITATIVE      Result Value Range   D-Dimer, Quant <0.27  0.00 - 0.48 ug/mL-FEU   Laboratory interpretation all normal except mild hypokalemia   Dg Chest 2 View  04/12/2013   *RADIOLOGY REPORT*  Clinical Data: Chest pain and shortness of breath.  CHEST - 2 VIEW  Comparison: 11/18/2012  Findings: The heart size and pulmonary vascularity are normal and the lungs are clear.  No osseous abnormality of significance.  IMPRESSION: Essentially normal chest.   Original Report Authenticated  By: Francene Boyers, M.D.   Ct Angio Chest W/cm &/or Wo Cm  04/12/2013   *RADIOLOGY REPORT*  Clinical Data: Chest pain.  Evaluate for pulmonary embolus.  CT ANGIOGRAPHY CHEST  Technique:  Multidetector CT imaging of the chest using the standard protocol during bolus administration of intravenous contrast. Multiplanar reconstructed images including MIPs were obtained and reviewed to evaluate the vascular anatomy.  Contrast: OMNIPAQUE IOHEXOL 350 MG/ML SOLN  Comparison: 04/12/2013  Findings: A mosaic attenuation pattern is identified within the lower lobes.  No airspace consolidation or atelectasis.  No pleural effusion noted.  The heart size is normal.  No pericardial effusion noted.  There is no enlarged mediastinal or hilar lymph nodes.  The main pulmonary artery is patent.  No abnormal low bar or segmental pulmonary artery filling defect identified.  The visualized osseous structures are unremarkable.  No worrisome lytic or sclerotic bone lesions identified.  Limited imaging through the upper abdomen is unremarkable.  IMPRESSION:  1.  No evidence for acute pulmonary embolus. 2.  Mosaic attenuation pattern is identified in both lung bases which may be seen with small airways disease.   Original Report Authenticated By: Signa Kell, M.D.     Date: 04/12/2013  Rate: 104  Rhythm: sinus tachycardia  QRS Axis: normal  Intervals: normal  ST/T Wave abnormalities: normal  Conduction Disutrbances:none  Narrative Interpretation:   Old EKG Reviewed: unchanged from 04/03/2012 except HR was 81  1. Chest wall pain     Discharge Medication List as of 04/12/2013  7:30 PM    START taking these medications   Details  HYDROcodone-acetaminophen (NORCO/VICODIN) 5-325 MG per tablet Take 1 or 2 po Q 6hrs for pain, Print    methocarbamol (ROBAXIN) 500 MG tablet Take 1 or 2 po Q 6hrs for pain, Print        Plan discharge   Devoria Albe, MD, FACEP   MDM     I personally performed the services  described in this documentation, which was scribed in my presence. The recorded information has been reviewed and considered.  Devoria Albe, MD, FACEP   Ward Givens, MD 04/12/13 [id number]  Ward Givens, MD 04/12/13 2134

## 2013-04-12 NOTE — ED Notes (Signed)
MD at bedside. 

## 2013-04-12 NOTE — ED Notes (Addendum)
Pt c/o right side chest pain that is described as a numbness and pressure with radiation to right arm that started three weeks ago, pain has been intermittent increasing in frequency and duration.  is associated with sob, dizziness, nausea at times. Pt also states that she has felt her heart flutter at the beginning of these episodes.

## 2013-05-05 ENCOUNTER — Emergency Department (HOSPITAL_COMMUNITY)
Admission: EM | Admit: 2013-05-05 | Discharge: 2013-05-05 | Disposition: A | Discharge status: Home / Self Care | Payer: BC Managed Care – PPO | Attending: Emergency Medicine | Admitting: Emergency Medicine

## 2013-05-05 ENCOUNTER — Telehealth: Payer: Self-pay

## 2013-05-05 ENCOUNTER — Encounter (HOSPITAL_COMMUNITY): Payer: Self-pay | Admitting: *Deleted

## 2013-05-05 ENCOUNTER — Emergency Department (HOSPITAL_COMMUNITY): Payer: BC Managed Care – PPO

## 2013-05-05 DIAGNOSIS — Z3202 Encounter for pregnancy test, result negative: Secondary | ICD-10-CM | POA: Insufficient documentation

## 2013-05-05 DIAGNOSIS — R079 Chest pain, unspecified: Secondary | ICD-10-CM | POA: Insufficient documentation

## 2013-05-05 DIAGNOSIS — J45909 Unspecified asthma, uncomplicated: Secondary | ICD-10-CM | POA: Insufficient documentation

## 2013-05-05 DIAGNOSIS — R110 Nausea: Secondary | ICD-10-CM | POA: Insufficient documentation

## 2013-05-05 DIAGNOSIS — Z79899 Other long term (current) drug therapy: Secondary | ICD-10-CM | POA: Insufficient documentation

## 2013-05-05 DIAGNOSIS — Z91040 Latex allergy status: Secondary | ICD-10-CM | POA: Insufficient documentation

## 2013-05-05 DIAGNOSIS — Z8679 Personal history of other diseases of the circulatory system: Secondary | ICD-10-CM | POA: Insufficient documentation

## 2013-05-05 LAB — POCT I-STAT TROPONIN I: Troponin i, poc: 0 ng/mL (ref 0.00–0.08)

## 2013-05-05 MED ORDER — GI COCKTAIL ~~LOC~~
30.0000 mL | Freq: Once | ORAL | Status: AC
  Administered 2013-05-05: 30 mL via ORAL
  Filled 2013-05-05: qty 30

## 2013-05-05 MED ORDER — KETOROLAC TROMETHAMINE 30 MG/ML IJ SOLN
30.0000 mg | Freq: Once | INTRAMUSCULAR | Status: AC
  Administered 2013-05-05: 30 mg via INTRAVENOUS
  Filled 2013-05-05: qty 1

## 2013-05-05 NOTE — Telephone Encounter (Signed)
Spoke with patient who states that she is having the chest pain now.  Advised to go to the ED in [place] that she is close to.

## 2013-05-05 NOTE — ED Provider Notes (Signed)
History    CSN: [id number] Arrival date & time 05/05/13  1410  First MD Initiated Contact with Patient 05/05/13 1456     Chief Complaint  Patient presents with  . Chest Pain   (Consider location/radiation/quality/duration/timing/severity/associated sxs/prior Treatment) HPI Comments: 32 yo female with no medical hx presents with central chest pain for 4 weeks, constant, pt has been seen multiple times, CT PE negative, blood work done, Publishing copy.  Pt feels a constant ache.  No diaphoresis or vomiting.  No exertional sxs.  Patient denies blood clot history, active cancer, recent major trauma or surgery, unilateral leg swelling/ pain, recent long travel, hemoptysis or oral contraceptives. Mild radiation to neck.  Worse with movement, tightness feeling.  Pt tried otc meds..   Patient is a 32 y.o. female presenting with chest pain. The history is provided by the patient.  Chest Pain Associated symptoms: nausea   Associated symptoms: no abdominal pain, no back pain, no fever, no headache, no shortness of breath and not vomiting    Past Medical History  Diagnosis Date  . Asthma   . Irregular heart beat    Past Surgical History  Procedure Laterality Date  . Tonsils and adnoids removed     Family History  Problem Relation Age of Onset  . Hypertension Mother   . Diabetes Mother   . Mental illness Mother   . Hypertension Father   . Asthma Brother   . Heart disease Maternal Grandmother   . Hypertension Maternal Grandmother   . Cancer Paternal Grandmother     breast cancer   History  Substance Use Topics  . Smoking status: Never Smoker   . Smokeless tobacco: Never Used  . Alcohol Use: No   OB History   Grav Para Term Preterm Abortions TAB SAB Ect Mult Living                 Review of Systems  Constitutional: Negative for fever and chills.  HENT: Negative for neck pain and neck stiffness.   Eyes: Negative for visual disturbance.  Respiratory: Negative for shortness of breath.    Cardiovascular: Positive for chest pain.  Gastrointestinal: Positive for nausea. Negative for vomiting and abdominal pain.  Genitourinary: Negative for dysuria and flank pain.  Musculoskeletal: Negative for back pain.  Skin: Negative for rash.  Neurological: Negative for light-headedness and headaches.    Allergies  Ceftriaxone sodium; Diflucan; Eggs or egg-derived products; and Latex  Home Medications   Current Outpatient Rx  Name  Route  Sig  Dispense  Refill  . calcium carbonate (TUMS - DOSED IN MG ELEMENTAL CALCIUM) 500 MG chewable tablet   Oral   Chew 1 tablet by mouth daily.         Marland Kitchen ibuprofen (ADVIL,MOTRIN) 200 MG tablet   Oral   Take 800 mg by mouth every 6 (six) hours as needed for pain.         Marland Kitchen levonorgestrel (MIRENA) 20 MCG/24HR IUD   Intrauterine   1 each by Intrauterine route once.         . naproxen sodium (ANAPROX) 220 MG tablet   Oral   Take 440 mg by mouth 2 (two) times daily with a meal.         . albuterol (PROVENTIL HFA;VENTOLIN HFA) 108 (90 BASE) MCG/ACT inhaler   Inhalation   Inhale 2 puffs into the lungs every 6 (six) hours as needed. For shortness of breath.         Marland Kitchen  methocarbamol (ROBAXIN) 500 MG tablet      Take 1 or 2 po Q 6hrs for pain   60 tablet   0    LMP 05/03/2013 Physical Exam  Nursing note and vitals reviewed. Constitutional: She is oriented to person, place, and time. She appears well-developed and well-nourished.  HENT:  Head: Normocephalic and atraumatic.  Eyes: Conjunctivae are normal. Right eye exhibits no discharge. Left eye exhibits no discharge.  Neck: Normal range of motion. Neck supple. No tracheal deviation present.  Cardiovascular: Regular rhythm and intact distal pulses.   No murmur heard. Pulmonary/Chest: Effort normal and breath sounds normal.  Abdominal: Soft. She exhibits no distension. There is no tenderness. There is no guarding.  Musculoskeletal: She exhibits no edema and no tenderness.   Neurological: She is alert and oriented to person, place, and time.  Skin: Skin is warm. No rash noted.  Psychiatric: She has a normal mood and affect.    ED Course  Procedures (including critical care time) Labs Reviewed  POCT I-STAT TROPONIN I  POCT PREGNANCY, URINE   Dg Chest 2 View  05/05/2013   *RADIOLOGY REPORT*  Clinical Data: Chest pain  CHEST - 2 VIEW  Comparison: 04/12/13  Findings: Cardiomediastinal silhouette is stable.  No acute infiltrate or pleural effusion.  No pulmonary edema.  Bony thorax is unremarkable.  IMPRESSION: No active disease.  No significant change.   Original Report Authenticated By: Natasha Mead, M.D.   1. Chest pain     MDM  Pt is frustrated due to constant pain for over a month.   No change in 4 wks and pt had neg workup.  Repeat trop and ekg and cxr unremarkable, reviewed. No indication to repeat CT chest.   Discussed outpt stress test with pcp.   Mild improvement in pain in ed.   DC  Date: 05/05/2013  Rate: 94  Rhythm: normal sinus rhythm  QRS Axis: normal  Intervals: normal  ST/T Wave abnormalities: normal  Conduction Disutrbances:none  Narrative Interpretation:   Old EKG Reviewed: unchanged    Enid Skeens, MD 05/05/13 [phone]

## 2013-05-05 NOTE — ED Notes (Signed)
Labs obtained from IV insertion, but after medication administration patient started c/o pain at IV site and IV was discontinued at patient's request.

## 2013-05-05 NOTE — Telephone Encounter (Signed)
Noted and agree. 

## 2013-05-05 NOTE — Telephone Encounter (Signed)
On record review, she had no abnormal tests as far as her heart is concerned, dx with chest wall pain in ED. If her stress is up this could be causing chest pain, could also be reflux. Needs to sched and keep OV, missed last one Needs to keep seeing therapist.for steress and anxiety   Go to ED if worsened chest pain before her oV, pls explain and let her know

## 2013-05-05 NOTE — ED Notes (Signed)
Pt reports R side cp which radiates to her R arm.  Pt reports this has been going on for 9 weeks, reports the severity of the pain comes in waves.  Pt reports being seen at [facility] last week for same and was told that every tests done was negative that it could just be pulled muscle.  Pt reports that she remembers wearing a heart monitor when she was about 32yo but does not remember what for.  Pt reports she has been taking advil and muscle relaxers without relief.  Pt reports she woke up this am with dizziness.

## 2013-12-23 ENCOUNTER — Emergency Department (HOSPITAL_COMMUNITY): Payer: BC Managed Care – PPO

## 2013-12-23 ENCOUNTER — Emergency Department (HOSPITAL_COMMUNITY)
Admission: EM | Admit: 2013-12-23 | Discharge: 2013-12-24 | Disposition: A | Discharge status: Home / Self Care | Payer: BC Managed Care – PPO | Attending: Emergency Medicine | Admitting: Emergency Medicine

## 2013-12-23 ENCOUNTER — Encounter (HOSPITAL_COMMUNITY): Payer: Self-pay | Admitting: Emergency Medicine

## 2013-12-23 DIAGNOSIS — R110 Nausea: Secondary | ICD-10-CM | POA: Insufficient documentation

## 2013-12-23 DIAGNOSIS — Z3202 Encounter for pregnancy test, result negative: Secondary | ICD-10-CM | POA: Insufficient documentation

## 2013-12-23 DIAGNOSIS — Z79899 Other long term (current) drug therapy: Secondary | ICD-10-CM | POA: Insufficient documentation

## 2013-12-23 DIAGNOSIS — J45909 Unspecified asthma, uncomplicated: Secondary | ICD-10-CM | POA: Insufficient documentation

## 2013-12-23 DIAGNOSIS — D259 Leiomyoma of uterus, unspecified: Secondary | ICD-10-CM | POA: Insufficient documentation

## 2013-12-23 DIAGNOSIS — N898 Other specified noninflammatory disorders of vagina: Secondary | ICD-10-CM | POA: Insufficient documentation

## 2013-12-23 DIAGNOSIS — D219 Benign neoplasm of connective and other soft tissue, unspecified: Secondary | ICD-10-CM

## 2013-12-23 DIAGNOSIS — Z91040 Latex allergy status: Secondary | ICD-10-CM | POA: Insufficient documentation

## 2013-12-23 LAB — HCG, QUANTITATIVE, PREGNANCY

## 2013-12-23 LAB — URINALYSIS, ROUTINE W REFLEX MICROSCOPIC
BILIRUBIN URINE: NEGATIVE
Glucose, UA: NEGATIVE mg/dL
Hgb urine dipstick: NEGATIVE
KETONES UR: NEGATIVE mg/dL
Leukocytes, UA: NEGATIVE
NITRITE: NEGATIVE
PH: 7.5 (ref 5.0–8.0)
Protein, ur: NEGATIVE mg/dL
Specific Gravity, Urine: 1.02 (ref 1.005–1.030)
UROBILINOGEN UA: 0.2 mg/dL (ref 0.0–1.0)

## 2013-12-23 LAB — WET PREP, GENITAL
Trich, Wet Prep: NONE SEEN
YEAST WET PREP: NONE SEEN

## 2013-12-23 LAB — BASIC METABOLIC PANEL
BUN: 13 mg/dL (ref 6–23)
CALCIUM: 9.6 mg/dL (ref 8.4–10.5)
CO2: 25 mEq/L (ref 19–32)
Chloride: 106 mEq/L (ref 96–112)
Creatinine, Ser: 0.86 mg/dL (ref 0.50–1.10)
GFR, EST NON AFRICAN AMERICAN: 88 mL/min — AB (ref >90)
GLUCOSE: 83 mg/dL (ref 70–99)
POTASSIUM: 4.3 meq/L (ref 3.7–5.3)
SODIUM: 140 meq/L (ref 137–147)

## 2013-12-23 LAB — CBC WITH DIFFERENTIAL/PLATELET
Basophils Absolute: 0 10*3/uL (ref 0.0–0.1)
Basophils Relative: 1 % (ref 0–1)
EOS ABS: 0.2 10*3/uL (ref 0.0–0.7)
EOS PCT: 3 % (ref 0–5)
HEMATOCRIT: 35.3 % — AB (ref 36.0–46.0)
Hemoglobin: 11.7 g/dL — ABNORMAL LOW (ref 12.0–15.0)
LYMPHS ABS: 2.8 10*3/uL (ref 0.7–4.0)
Lymphocytes Relative: 50 % — ABNORMAL HIGH (ref 12–46)
MCH: 26.7 pg (ref 26.0–34.0)
MCHC: 33.1 g/dL (ref 30.0–36.0)
MCV: 80.4 fL (ref 78.0–100.0)
MONO ABS: 0.5 10*3/uL (ref 0.1–1.0)
Monocytes Relative: 9 % (ref 3–12)
Neutro Abs: 2.1 10*3/uL (ref 1.7–7.7)
Neutrophils Relative %: 37 % — ABNORMAL LOW (ref 43–77)
PLATELETS: 182 10*3/uL (ref 150–400)
RBC: 4.39 MIL/uL (ref 3.87–5.11)
RDW: 13.5 % (ref 11.5–15.5)
WBC: 5.6 10*3/uL (ref 4.0–10.5)

## 2013-12-23 LAB — PREGNANCY, URINE: Preg Test, Ur: NEGATIVE

## 2013-12-23 MED ORDER — ONDANSETRON HCL 4 MG/2ML IJ SOLN
INTRAMUSCULAR | Status: AC
  Filled 2013-12-23: qty 2

## 2013-12-23 MED ORDER — MORPHINE SULFATE 4 MG/ML IJ SOLN
INTRAMUSCULAR | Status: AC
  Filled 2013-12-23: qty 1

## 2013-12-23 MED ORDER — MORPHINE SULFATE 4 MG/ML IJ SOLN
4.0000 mg | Freq: Once | INTRAMUSCULAR | Status: AC
  Administered 2013-12-23: 4 mg via INTRAVENOUS

## 2013-12-23 MED ORDER — IOHEXOL 300 MG/ML  SOLN
50.0000 mL | Freq: Once | INTRAMUSCULAR | Status: AC | PRN
  Administered 2013-12-23: 50 mL via ORAL

## 2013-12-23 MED ORDER — SODIUM CHLORIDE 0.9 % IJ SOLN
INTRAMUSCULAR | Status: AC
  Filled 2013-12-23: qty 500

## 2013-12-23 MED ORDER — IOHEXOL 300 MG/ML  SOLN
100.0000 mL | Freq: Once | INTRAMUSCULAR | Status: AC | PRN
  Administered 2013-12-23: 100 mL via INTRAVENOUS

## 2013-12-23 MED ORDER — ONDANSETRON HCL 4 MG/2ML IJ SOLN
4.0000 mg | Freq: Once | INTRAMUSCULAR | Status: AC
  Administered 2013-12-23: 4 mg via INTRAVENOUS

## 2013-12-23 MED ORDER — MORPHINE SULFATE 4 MG/ML IJ SOLN
4.0000 mg | Freq: Once | INTRAMUSCULAR | Status: AC
  Administered 2013-12-23: 4 mg via INTRAVENOUS
  Filled 2013-12-23: qty 1

## 2013-12-23 MED ORDER — ONDANSETRON HCL 4 MG/2ML IJ SOLN
4.0000 mg | Freq: Once | INTRAMUSCULAR | Status: AC
  Administered 2013-12-23: 4 mg via INTRAVENOUS
  Filled 2013-12-23: qty 2

## 2013-12-23 NOTE — ED Notes (Signed)
Patient complaining lower abdominal pain into groin area. States "it feels like my privates are coming out. I have an IUD and had to have one removed in the past."

## 2013-12-23 NOTE — ED Provider Notes (Signed)
CSN: [id number]     Arrival date & time 12/23/13  1817 History   First MD Initiated Contact with Patient 12/23/13 2102     Chief Complaint  Patient presents with  . Abdominal Pain     (Consider location/radiation/quality/duration/timing/severity/associated sxs/prior Treatment) Patient is a 33 y.o. female presenting with abdominal pain. The history is provided by the patient.  Abdominal Pain Associated symptoms: nausea   Associated symptoms: no chest pain, no diarrhea, no shortness of breath, no vaginal bleeding, no vaginal discharge and no vomiting    patient presents with lower abdominal pain. Began a couple days ago. States it is severe. No dysuria. She states she feels as if her insides are coming out. No fevers. No nausea vomiting or diarrhea. She has had a decreased appetite. No vaginal discharge. She has an IUD and is worried about that. She states her last period was  Feb 14th, but states she had a little spotting a week ago.. She does have a previous history of ovarian cysts. Patient's mother states that blood tests are the only way to know whether the patient is pregnant. She states it does not show up on her urine test.  Past Medical History  Diagnosis Date  . Asthma   . Irregular heart beat    Past Surgical History  Procedure Laterality Date  . Tonsils and adnoids removed     Family History  Problem Relation Age of Onset  . Hypertension Mother   . Diabetes Mother   . Mental illness Mother   . Hypertension Father   . Asthma Brother   . Heart disease Maternal Grandmother   . Hypertension Maternal Grandmother   . Cancer Paternal Grandmother     breast cancer   History  Substance Use Topics  . Smoking status: Never Smoker   . Smokeless tobacco: Never Used  . Alcohol Use: Yes     Comment: occasionally   OB History   Grav Para Term Preterm Abortions TAB SAB Ect Mult Living                 Review of Systems  Constitutional: Negative for activity change and  appetite change.  Eyes: Negative for pain.  Respiratory: Negative for chest tightness and shortness of breath.   Cardiovascular: Negative for chest pain and leg swelling.  Gastrointestinal: Positive for nausea and abdominal pain. Negative for vomiting and diarrhea.  Genitourinary: Negative for flank pain, vaginal bleeding, vaginal discharge and vaginal pain.  Musculoskeletal: Negative for back pain and neck stiffness.  Skin: Negative for rash.  Neurological: Negative for weakness, numbness and headaches.  Psychiatric/Behavioral: Negative for behavioral problems.      Allergies  Ceftriaxone sodium; Diflucan; Eggs or egg-derived products; and Latex  Home Medications   Current Outpatient Rx  Name  Route  Sig  Dispense  Refill  . calcium carbonate (TUMS - DOSED IN MG ELEMENTAL CALCIUM) 500 MG chewable tablet   Oral   Chew 1 tablet by mouth daily.         Marland Kitchen PARAGARD INTRAUTERINE COPPER IUD IUD   Intrauterine   1 each by Intrauterine route once.         Marland Kitchen albuterol (PROVENTIL HFA;VENTOLIN HFA) 108 (90 BASE) MCG/ACT inhaler   Inhalation   Inhale 2 puffs into the lungs every 6 (six) hours as needed. For shortness of breath.          BP 134/90  Pulse 88  Temp(Src) 98 F (36.7 C) (Oral)  Resp 20  Ht 5' 4.5" (1.638 m)  Wt 192 lb (87.091 kg)  BMI 32.46 kg/m2  SpO2 99%  LMP 12/16/2013 Physical Exam  Nursing note and vitals reviewed. Constitutional: She is oriented to person, place, and time. She appears well-developed and well-nourished.  HENT:  Head: Normocephalic and atraumatic.  Eyes: EOM are normal. Pupils are equal, round, and reactive to light.  Neck: Normal range of motion. Neck supple.  Cardiovascular: Normal rate, regular rhythm and normal heart sounds.   No murmur heard. Pulmonary/Chest: Effort normal and breath sounds normal. No respiratory distress. She has no wheezes. She has no rales.  Abdominal: Soft. Bowel sounds are normal. She exhibits no distension.  There is tenderness. There is no rebound and no guarding.  Moderate right lower quadrant tenderness. No hernias palpated.  Genitourinary: Vaginal discharge found.  Take white vaginal discharge. Right adnexal tenderness without severe cervical motion tenderness. IUD strings in place  Musculoskeletal: Normal range of motion.  Neurological: She is alert and oriented to person, place, and time. No cranial nerve deficit.  Skin: Skin is warm and dry.  Psychiatric: She has a normal mood and affect. Her speech is normal.    ED Course  Procedures (including critical care time) Labs Review Labs Reviewed  WET PREP, GENITAL  GC/CHLAMYDIA PROBE AMP  URINALYSIS, ROUTINE W REFLEX MICROSCOPIC  PREGNANCY, URINE  CBC WITH DIFFERENTIAL  HCG, QUANTITATIVE, PREGNANCY  BASIC METABOLIC PANEL   Imaging Review No results found.   EKG Interpretation None      MDM   Final diagnoses:  None    Patient with pelvic/right lower quadrant pain. Urinalysis reassuring. Patient states her pregnancies have not shown up on urinalysis, so serum hCG was drawn. Pelvic exam does show right sided tenderness. She has severe enough tenderness I think the CT is warranted to evaluate for appendicitis or possible tubo-ovarian abscess. She does have an IUD in place. Care will be turned over to Dr. Durenda Age R. Alvino Chapel, MD 12/23/13 2159

## 2013-12-24 MED ORDER — METRONIDAZOLE 500 MG PO TABS: 500.0000 mg | ORAL_TABLET | Freq: Two times a day (BID) | ORAL | Status: DC

## 2013-12-24 MED ORDER — HYDROCODONE-ACETAMINOPHEN 5-325 MG PO TABS: 1.0000 | ORAL_TABLET | Freq: Four times a day (QID) | ORAL | Status: DC | PRN

## 2013-12-24 NOTE — ED Provider Notes (Signed)
Ct shows fibroids,  Wet prep clue cells,  Pt given flagyl and vicodin and referred to ob-gyn for next week  Maudry Diego, MD 12/24/13 0005

## 2013-12-24 NOTE — Discharge Instructions (Signed)
Follow up with dr. Elonda Husky next week.

## 2013-12-25 ENCOUNTER — Encounter (HOSPITAL_COMMUNITY): Payer: Self-pay | Admitting: Emergency Medicine

## 2013-12-25 ENCOUNTER — Emergency Department (HOSPITAL_COMMUNITY)
Admission: EM | Admit: 2013-12-25 | Discharge: 2013-12-26 | Disposition: A | Discharge status: Home / Self Care | Payer: BC Managed Care – PPO | Attending: Emergency Medicine | Admitting: Emergency Medicine

## 2013-12-25 DIAGNOSIS — Z79899 Other long term (current) drug therapy: Secondary | ICD-10-CM | POA: Insufficient documentation

## 2013-12-25 DIAGNOSIS — R000 Tachycardia, unspecified: Secondary | ICD-10-CM | POA: Insufficient documentation

## 2013-12-25 DIAGNOSIS — J45909 Unspecified asthma, uncomplicated: Secondary | ICD-10-CM | POA: Insufficient documentation

## 2013-12-25 DIAGNOSIS — Z792 Long term (current) use of antibiotics: Secondary | ICD-10-CM | POA: Insufficient documentation

## 2013-12-25 DIAGNOSIS — Z91040 Latex allergy status: Secondary | ICD-10-CM | POA: Insufficient documentation

## 2013-12-25 DIAGNOSIS — Z791 Long term (current) use of non-steroidal anti-inflammatories (NSAID): Secondary | ICD-10-CM | POA: Insufficient documentation

## 2013-12-25 DIAGNOSIS — R102 Pelvic and perineal pain: Secondary | ICD-10-CM

## 2013-12-25 DIAGNOSIS — Z8679 Personal history of other diseases of the circulatory system: Secondary | ICD-10-CM | POA: Insufficient documentation

## 2013-12-25 DIAGNOSIS — Z87440 Personal history of urinary (tract) infections: Secondary | ICD-10-CM | POA: Insufficient documentation

## 2013-12-25 DIAGNOSIS — D259 Leiomyoma of uterus, unspecified: Secondary | ICD-10-CM

## 2013-12-25 LAB — GC/CHLAMYDIA PROBE AMP
CT PROBE, AMP APTIMA: NEGATIVE
GC PROBE AMP APTIMA: NEGATIVE

## 2013-12-25 MED ORDER — PROMETHAZINE HCL 25 MG/ML IJ SOLN
25.0000 mg | Freq: Once | INTRAMUSCULAR | Status: AC
  Administered 2013-12-25: 25 mg via INTRAMUSCULAR
  Filled 2013-12-25: qty 1

## 2013-12-25 MED ORDER — KETOROLAC TROMETHAMINE 60 MG/2ML IM SOLN
60.0000 mg | Freq: Once | INTRAMUSCULAR | Status: AC
  Administered 2013-12-25: 60 mg via INTRAMUSCULAR
  Filled 2013-12-25: qty 2

## 2013-12-25 NOTE — ED Provider Notes (Signed)
CSN: [id number]     Arrival date & time 12/25/13  1920 History   This chart was scribed for Johnna Acosta, MD by Era Bumpers, ED scribe. This patient was seen in room [facility] and the patient's care was started at [facility].  Chief Complaint  Patient presents with  . Abdominal Pain   The history is provided by the patient. No language interpreter was used.   HPI Comments: Katianna Colantonio is a 33 y.o. female who presents to the Emergency Department complaining of lower abdominal pain, onset 4 days ago. She also reports associated lower back pressure for x2 weeks that has gradually worsened also c/o emesis episodes. She states a "feeling something is pulling through my vagina." She states an increase of vaginal d/c lately and appears "milky." She has had an IUD in place over the past year and states that she would like to have it taken out. She states that it normally gives her cramping. She reports mild pain and pressure w/BM. In prior visit 2 days ago she was evaluated with CT scan of the abdomen and pelvis which showed a fibroid uterus without any other signs of pathologic surgical findings, lab work was unremarkable with a normal CBC, normal basic metabolic panel and a vaginal sample which showed mild bacterial vaginosis, no signs of other STDs, negative for gonorrhea Chlamydia and Trichomonas. She has been treated for bacterial vaginosis with Flagyl, she has had increased amounts of vomiting and states that she is having difficulty keeping medicines and fluids down. The pain however has been persistent for some time, it has been worse in the last 2 weeks but she does notice pain going on as far back as several months ago. She is in process of changing her providers to an endocrinologist in [place].   Past Medical History  Diagnosis Date  . Asthma   . Irregular heart beat    Past Surgical History  Procedure Laterality Date  . Tonsils and adnoids removed     Family History  Problem Relation Age  of Onset  . Hypertension Mother   . Diabetes Mother   . Mental illness Mother   . Hypertension Father   . Asthma Brother   . Heart disease Maternal Grandmother   . Hypertension Maternal Grandmother   . Cancer Paternal Grandmother     breast cancer   History  Substance Use Topics  . Smoking status: Never Smoker   . Smokeless tobacco: Never Used  . Alcohol Use: Yes     Comment: occasionally   OB History   Grav Para Term Preterm Abortions TAB SAB Ect Mult Living                 Review of Systems  Constitutional: Negative for fever and chills.  Respiratory: Negative for cough and shortness of breath.   Cardiovascular: Negative for chest pain.  Gastrointestinal: Positive for nausea, vomiting and abdominal pain. Negative for diarrhea.  Musculoskeletal: Negative for back pain.  All other systems reviewed and are negative.    Allergies  Ceftriaxone sodium; Diflucan; Eggs or egg-derived products; and Latex  Home Medications   Current Outpatient Rx  Name  Route  Sig  Dispense  Refill  . albuterol (PROVENTIL HFA;VENTOLIN HFA) 108 (90 BASE) MCG/ACT inhaler   Inhalation   Inhale 2 puffs into the lungs every 6 (six) hours as needed. For shortness of breath.         . calcium carbonate (TUMS - DOSED IN MG ELEMENTAL CALCIUM)  500 MG chewable tablet   Oral   Chew 1 tablet by mouth daily.         Marland Kitchen HYDROcodone-acetaminophen (NORCO/VICODIN) 5-325 MG per tablet   Oral   Take 1 tablet by mouth every 6 (six) hours as needed for moderate pain.   20 tablet   0   . metroNIDAZOLE (FLAGYL) 500 MG tablet   Oral   Take 1 tablet (500 mg total) by mouth 2 (two) times daily. One po bid x 7 days   14 tablet   0   . PARAGARD INTRAUTERINE COPPER IUD IUD   Intrauterine   1 each by Intrauterine route once.         . metoCLOPramide (REGLAN) 10 MG tablet   Oral   Take 1 tablet (10 mg total) by mouth 3 (three) times daily as needed for nausea (headache / nausea).   20 tablet   0    . naproxen (NAPROSYN) 500 MG tablet   Oral   Take 1 tablet (500 mg total) by mouth 2 (two) times daily with a meal.   30 tablet   0   . promethazine (PHENERGAN) 25 MG suppository   Rectal   Place 1 suppository (25 mg total) rectally every 6 (six) hours as needed for nausea or vomiting.   12 each   0    Triage Vitals: BP 125/92  Pulse 96  Temp(Src) 99.7 F (37.6 C) (Oral)  Resp 20  Ht 5' 2.5" (1.588 m)  Wt 192 lb (87.091 kg)  BMI 34.54 kg/m2  SpO2 99%  LMP 12/04/2013  Physical Exam  Nursing note and vitals reviewed. Constitutional: She is oriented to person, place, and time. She appears well-developed and well-nourished. No distress.  HENT:  Head: Normocephalic and atraumatic.  Eyes: Conjunctivae are normal. Right eye exhibits no discharge. Left eye exhibits no discharge.  Neck: Normal range of motion.  Cardiovascular: Regular rhythm and normal heart sounds.   No murmur heard. Mild tachycardia about 105, strong pulses  Pulmonary/Chest: Effort normal and breath sounds normal. No respiratory distress. She has no wheezes. She has no rales.  Abdominal: Soft. Bowel sounds are normal. She exhibits no distension and no mass. There is tenderness. There is no rebound and no guarding.  Bilateral lower abdominal tenderness to palpation. Infraumbilical abdominal tenderness w/out masses, no guarding, no peritoneal signs   Genitourinary:  Chaperone present for pelvic exam, mild to moderate amount of milky white discharge, no foul odor, IUD in place, removed at patient's request. See separate note for removal.  Musculoskeletal: Normal range of motion. She exhibits no edema.  Neurological: She is alert and oriented to person, place, and time.  Skin: Skin is warm and dry.  Psychiatric: She has a normal mood and affect. Thought content normal.    ED Course  FOREIGN BODY REMOVAL Date/Time: 12/26/2013 12:12 AM Performed by: Noemi Chapel D Authorized by: Noemi Chapel D Consent: Verbal  consent obtained. Risks and benefits: risks, benefits and alternatives were discussed Consent given by: patient Patient understanding: patient states understanding of the procedure being performed Required items: required blood products, implants, devices, and special equipment available Patient identity confirmed: verbally with patient Time out: Immediately prior to procedure a "time out" was called to verify the correct patient, procedure, equipment, support staff and site/side marked as required. Body area: vagina Patient sedated: no Patient cooperative: yes Localization method: visualized and speculum Removal mechanism: ring forceps Complexity: simple 1 objects recovered. Objects recovered: IUID - from the  Uterus Post-procedure assessment: foreign body removed Patient tolerance: Patient tolerated the procedure well with no immediate complications.   (including critical care time) DIAGNOSTIC STUDIES: Oxygen Saturation is 99% on room air, normal by my interpretation.    COORDINATION OF CARE: At 1150 PM Discussed treatment plan with patient which includes phenergan, toradol. Patient agrees.   Labs Review Labs Reviewed - No data to display Imaging Review No results found.   EKG Interpretation None      MDM   Final diagnoses:  Pelvic pain  Fibroid uterus    The patient's exam and history had recent workup suggests that the patient does not have an acute pathologic etiology of her pain. Her urinalysis and blood work and CT scan have all been reviewed and the patient has been informed that there is no other indication for further testing this evening. Removal of the IUD was performed, pain medication and nausea medication were given and the patient improved. She will be encouraged to followup with her gynecologist.  Pt appears stable for d/c.  Meds given in ED:  Medications  promethazine (PHENERGAN) injection 25 mg (25 mg Intramuscular Given 12/25/13 2342)  ketorolac  (TORADOL) injection 60 mg (60 mg Intramuscular Given 12/25/13 2342)    New Prescriptions   METOCLOPRAMIDE (REGLAN) 10 MG TABLET    Take 1 tablet (10 mg total) by mouth 3 (three) times daily as needed for nausea (headache / nausea).   NAPROXEN (NAPROSYN) 500 MG TABLET    Take 1 tablet (500 mg total) by mouth 2 (two) times daily with a meal.   PROMETHAZINE (PHENERGAN) 25 MG SUPPOSITORY    Place 1 suppository (25 mg total) rectally every 6 (six) hours as needed for nausea or vomiting.      I personally performed the services described in this documentation, which was scribed in my presence. The recorded information has been reviewed and is accurate.     Johnna Acosta, MD 12/26/13 [id number]

## 2013-12-25 NOTE — ED Notes (Signed)
Low abd pain for 4 days, seen here yesterday for same, dx with  Fibroids.

## 2013-12-25 NOTE — ED Notes (Signed)
Seen here on 3/4 for R pelvic pain.  CT showed IUD in place and fibroid uterus, Wet prep + for clue cells.  Started on Flagyl, Vicodin and Zofran.  States the Vicodin has not helped the pain and Zofran hasn't helped nausea. Took a Phenergan left over at home which did not help w/nausea either.  Wants to know if MD can remove the IUD and states the pain is unbearable, feeling as if her "insides are being pulled out."

## 2013-12-26 MED ORDER — NAPROXEN 500 MG PO TABS: 500.0000 mg | ORAL_TABLET | Freq: Two times a day (BID) | ORAL | Status: DC

## 2013-12-26 MED ORDER — PROMETHAZINE HCL 25 MG RE SUPP: 25.0000 mg | Freq: Four times a day (QID) | RECTAL | Status: DC | PRN

## 2013-12-26 MED ORDER — METOCLOPRAMIDE HCL 10 MG PO TABS
10.0000 mg | ORAL_TABLET | Freq: Three times a day (TID) | ORAL | Status: DC | PRN
Start: 2013-12-26 — End: 2014-01-26

## 2013-12-26 NOTE — ED Notes (Signed)
Patient with no complaints at this time. Respirations even and unlabored. Skin warm/dry. Discharge instructions reviewed with patient at this time. Patient given opportunity to voice concerns/ask questions. Patient discharged at this time and left Emergency Department with steady gait.   

## 2013-12-26 NOTE — Discharge Instructions (Signed)
You have asked me to remove your IUD today - this has been successful - you MUST follow up with your OBGYN for further evaluation of your abdominal and pelvic pain - they can give you further recommendations on the treatmetn of your fibroids.  If you are too nauseated to take oral Phenergan, you may try rectal Phenergan or Reglan. Do not take these medications together, they can be dangerous if taken together. Do not take these medications with alcohol.  Please call your doctor for a followup appointment within 24-48 hours. When you talk to your doctor please let them know that you were seen in the emergency department and have them acquire all of your records so that they can discuss the findings with you and formulate a treatment plan to fully care for your new and ongoing problems.

## 2014-01-07 ENCOUNTER — Other Ambulatory Visit: Payer: Self-pay

## 2014-01-07 DIAGNOSIS — E559 Vitamin D deficiency, unspecified: Secondary | ICD-10-CM

## 2014-01-07 MED ORDER — ERGOCALCIFEROL 1.25 MG (50000 UT) PO CAPS: 50000.0000 [IU] | ORAL_CAPSULE | ORAL | Status: DC

## 2014-01-21 ENCOUNTER — Other Ambulatory Visit (HOSPITAL_COMMUNITY): Payer: Self-pay | Admitting: Obstetrics and Gynecology

## 2014-01-21 NOTE — H&P (Signed)
Shannon [name] is a 33 y.o. 33 yo female P 1-0-1-1 presents for myomectomy because of menorrhagia and symptomatic uterine fibroids.  For the past several years the patient has had worsening menorrhagia and pelvic pain that has become severe in the past 4 months.  Menses lasts for 8 days with pad change hourly and at times more often due to "gushing" of blood episodes.  This flow is accompanied by large clots and cramping rated at 10/10 on a 10 point pain scale that no longer responds to NSAIDS and requires narcotic analgesia.  Patient goes on to report that her pain also occurs with the same intensity when she is not having her period.  Often she has had to leave or miss work due to the pain.  She admits to pain with bowel movements, dyspareunia and upper thigh pain with a numbing sensation.  She denies any urinary tract symptoms or vaginitis symptoms but has noticed excess vaginal discharge requiring a daily panty liner.  A pelvic ultrasound/sono-hysterogram in March 2015 showed: uterus 7.73 x 9.22 x 6.667 cm, endometrium: 0.363 cm with ? polyp on posterior cavity wall-vessel seen by color flow Doppler at the mass base measuring 1.3 x 0.54 x 0.86 cm;  #3 fibroids were noted: #2 sub-serosal: right 7.0 x 5.5 x 6.1 cm and left-lower uterine segment 2.9 x 2.7 x 3.5 cm and a right anterior intramural 2.4 x 1.5 x 2.4 cm.  Patient had a normal CBC, TSH and Prolactin.  A review of both medical and surgical management options were given to the patient however, since she wants to preserve fertility and pursue pregnancy as soon as possible she has opted for a myomectomy.   Past Medical History  OB History: G 2:  P:  1-0-1-1;   SVB 2002  GYN History: menarche:33 YO    LMP    Contracepton: None;  No history of STD,  History of abnormal PAP smear 2010 but repeated and returned normal   Last PAP smear 01/01/2014  Medical History: Asthma, Hypertension, Anemia and PMDD  Surgical History: 2006   Tonsillectomy-Adenoidectomy Admits to  problems with anesthesia in that she is difficult to awaken and awakens in a very aggressive state.  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 Ceftriaxone Sodium Other (See Comments)    unknown  . Diflucan [Fluconazole] Hives    Raw rash on abdomen and she had sores and swelling in mouth   . Eggs Or Egg-Derived Products Nausea And Vomiting  . Latex Rash    Denies sensitivity to peanuts, shellfish, soy, or adhesives.  ROS: Admits to glasses but  denies headache, vision changes, nasal congestion, dysphagia, tinnitus, dizziness, hoarseness, cough,  chest pain, shortness of breath, nausea, vomiting, diarrhea,constipation,  urinary frequency, urgency  dysuria, hematuria, vaginitis symptoms, pelvic pain, swelling of joints,easy bruising,  myalgias, arthralgias, skin rashes, unexplained weight loss and except as is mentioned in the history of present illness, patient's review of systems is otherwise negative.   Physical Exam  Bp: 114/74     P:  86      R:  16  Temperature:  98.6 degrees F orally   Weight: 202 lbs.  Height:  5\' 4"    BMI: 34.7  Neck: supple without masses or thyromegaly Lungs: clear to auscultation Heart: regular rate and rhythm Abdomen: soft, tender without guarding , firm mass from pelvis to 2 fingers above symphysis pubis  and no organomegaly Pelvic:EGBUS- wnl; vagina-normal rugae; uterus-12-14 weeks size, irregular and tender, cervix without lesions or motion tenderness; adnexae-no tenderness or masses Extremities:  no clubbing, cyanosis or edema   Assesment: Symptomatic Uterine Fibroids             Menorrhagia   Disposition:  A discussion was held with patient regarding the indication for her procedure(s) along with the risks, which include but are not limited to: reaction to anesthesia, damage to adjacent organs, infection,  excessive bleeding and if endometrial cavity is entered she may require a C-section for any deliveries.  Patient verbalized understanding of these risks and has consented to proceed with an Abdominal Myomectomy at [facility] on February 03, 2014 at 1 p.m.    CSN# [id number]   Aragorn Recker J. Florene Glen, PA-C  for Dr. Harvie Bridge. Mancel Bale

## 2014-01-21 NOTE — H&P (Signed)
Shannon [name] is a 33 y.o. 33 yo female P 1-0-1-1 presents for myomectomy because of menorrhagia and symptomatic uterine fibroids.  For the past several years the patient has had worsening menorrhagia and pelvic pain that has become severe in the past 4 months.  Menses lasts for 8 days with pad change hourly and at times more often due to "gushing" of blood episodes.  This flow is accompanied by large clots and cramping rated at 10/10 on a 10 point pain scale that no longer responds to NSAIDS and requires narcotic analgesia.  Patient goes on to report that her pain also occurs with the same intensity when she is not having her period.  Often she has had to leave or miss work due to the pain.  She admits to pain with bowel movements, dyspareunia and upper thigh pain with a numbing sensation.  She denies any urinary tract symptoms or vaginitis symptoms but has noticed excess vaginal discharge requiring a daily panty liner.  A pelvic ultrasound/sono-hysterogram in March 2015 showed: uterus 7.73 x 9.22 x 6.667 cm, endometrium: 0.363 cm with ? polyp on posterior cavity wall-vessel seen by color flow Doppler at the mass base measuring 1.3 x 0.54 x 0.86 cm;  #3 fibroids were noted: #2 sub-serosal: right 7.0 x 5.5 x 6.1 cm and left-lower uterine segment 2.9 x 2.7 x 3.5 cm and a right anterior intramural 2.4 x 1.5 x 2.4 cm.  Patient had a normal CBC, TSH and Prolactin.  A review of both medical and surgical management options were given to the patient however, since she wants to preserve fertility and pursue pregnancy as soon as possible she has opted for a myomectomy.   Past Medical History  OB History: G 2:  P:  1-0-1-1;   SVB 2002  GYN History: menarche:33 YO    LMP: 01/01/2014;  Contracepton: None;  No history of STD,   History of abnormal PAP smear 2010 but repeated and returned normal   Last PAP smear: 3//2015  Medical History: Asthma, Hypertension, Anemia and PMDD  Surgical History: 2006   Tonsillectomy-Adenoidectomy Admits to  problems with anesthesia in that she is difficult to awaken and awakens in a very aggressive state.  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 Ceftriaxone Sodium Other (See Comments)    unknown  . Diflucan [Fluconazole] Hives    Raw rash on abdomen and she had sores and swelling in mouth   . Eggs Or Egg-Derived Products Nausea And Vomiting  . Latex Rash    Denies sensitivity to peanuts, shellfish, soy, or adhesives.  ROS: Admits to glasses but  denies headache, vision changes, nasal congestion, dysphagia, tinnitus, dizziness, hoarseness, cough,  chest pain, shortness of breath, nausea, vomiting, diarrhea,constipation,  urinary frequency, urgency  dysuria, hematuria, vaginitis symptoms, pelvic pain, swelling of joints,easy bruising,  myalgias, arthralgias, skin rashes, unexplained weight loss and except as is mentioned in the history of present illness, patient's review of systems is otherwise negative.   Physical Exam  Bp: 114/74   P: 86  R: 16  Temperature: 98.6  degrees F orally Weight: 202 lbs.  Height: 5\' 4"     BMI: 34.7  Neck: supple without  masses or thyromegaly Lungs: clear to auscultation Heart: regular rate and rhythm Abdomen: soft, tender without guarding , firm mass from pelvis to 2 fingers above symphysis pubis  and no organomegaly Pelvic:EGBUS- wnl; vagina-normal rugae; uterus-12-14 weeks size, irregular and tender, cervix without lesions or motion tenderness; adnexae-no tenderness or masses Extremities:  no clubbing, cyanosis or edema   Assesment: Symptomatic Uterine Fibroids            Menorrhagia   Disposition:  A  discussion was held with patient regarding the indication for her procedure(s) along with the risks, which include but are not limited to: reaction to anesthesia, damage to adjacent organs, infection,  excessive bleeding and if endometrial cavity is entered she may require a C-section for any deliveries.  Patient verbalized understanding of these risks and has consented to proceed with an Abdominal Myomectomy at [place] on February 03, 2014 at 1 p.m.    CSN# [id number]   Karsyn Jamie J. Florene Glen, PA-C  for Dr. Harvie Bridge. Mancel Bale

## 2014-01-26 ENCOUNTER — Encounter (HOSPITAL_COMMUNITY): Payer: Self-pay | Admitting: Pharmacist

## 2014-01-29 ENCOUNTER — Other Ambulatory Visit: Payer: Self-pay | Admitting: Obstetrics and Gynecology

## 2014-02-02 MED ORDER — GENTAMICIN SULFATE 40 MG/ML IJ SOLN
INTRAVENOUS | Status: AC
  Administered 2014-02-03: 100 mL via INTRAVENOUS
  Filled 2014-02-02: qty 11

## 2014-02-03 ENCOUNTER — Encounter (HOSPITAL_COMMUNITY)
Admission: RE | Disposition: A | Discharge status: Home / Self Care | Payer: Self-pay | Source: Ambulatory Visit | Attending: Obstetrics and Gynecology

## 2014-02-03 ENCOUNTER — Inpatient Hospital Stay (HOSPITAL_COMMUNITY)
Admission: RE | Admit: 2014-02-03 | Discharge: 2014-02-05 | DRG: 743 | Disposition: A | Discharge status: Home / Self Care | Payer: BC Managed Care – PPO | Source: Ambulatory Visit | Attending: Obstetrics and Gynecology | Admitting: Obstetrics and Gynecology

## 2014-02-03 ENCOUNTER — Inpatient Hospital Stay (HOSPITAL_COMMUNITY): Payer: BC Managed Care – PPO | Admitting: Anesthesiology

## 2014-02-03 ENCOUNTER — Encounter (HOSPITAL_COMMUNITY): Payer: BC Managed Care – PPO | Admitting: Anesthesiology

## 2014-02-03 ENCOUNTER — Encounter (HOSPITAL_COMMUNITY): Payer: Self-pay

## 2014-02-03 DIAGNOSIS — D252 Subserosal leiomyoma of uterus: Principal | ICD-10-CM | POA: Diagnosis present

## 2014-02-03 DIAGNOSIS — Z9889 Other specified postprocedural states: Secondary | ICD-10-CM

## 2014-02-03 DIAGNOSIS — N949 Unspecified condition associated with female genital organs and menstrual cycle: Secondary | ICD-10-CM | POA: Diagnosis present

## 2014-02-03 DIAGNOSIS — Z803 Family history of malignant neoplasm of breast: Secondary | ICD-10-CM

## 2014-02-03 DIAGNOSIS — I10 Essential (primary) hypertension: Secondary | ICD-10-CM | POA: Diagnosis present

## 2014-02-03 DIAGNOSIS — IMO0002 Reserved for concepts with insufficient information to code with codable children: Secondary | ICD-10-CM | POA: Diagnosis present

## 2014-02-03 DIAGNOSIS — N920 Excessive and frequent menstruation with regular cycle: Secondary | ICD-10-CM | POA: Diagnosis present

## 2014-02-03 DIAGNOSIS — Z800 Family history of malignant neoplasm of digestive organs: Secondary | ICD-10-CM

## 2014-02-03 DIAGNOSIS — N943 Premenstrual tension syndrome: Secondary | ICD-10-CM | POA: Diagnosis present

## 2014-02-03 DIAGNOSIS — D649 Anemia, unspecified: Secondary | ICD-10-CM | POA: Diagnosis present

## 2014-02-03 DIAGNOSIS — J45909 Unspecified asthma, uncomplicated: Secondary | ICD-10-CM | POA: Diagnosis present

## 2014-02-03 HISTORY — PX: MYOMECTOMY: SHX85

## 2014-02-03 HISTORY — PX: DILATATION & CURRETTAGE/HYSTEROSCOPY WITH RESECTOCOPE: SHX5572

## 2014-02-03 LAB — CBC
HEMATOCRIT: 38.8 % (ref 36.0–46.0)
Hemoglobin: 12.7 g/dL (ref 12.0–15.0)
MCH: 27 pg (ref 26.0–34.0)
MCHC: 32.7 g/dL (ref 30.0–36.0)
MCV: 82.6 fL (ref 78.0–100.0)
Platelets: 223 10*3/uL (ref 150–400)
RBC: 4.7 MIL/uL (ref 3.87–5.11)
RDW: 14.3 % (ref 11.5–15.5)
WBC: 6.5 10*3/uL (ref 4.0–10.5)

## 2014-02-03 LAB — COMPREHENSIVE METABOLIC PANEL
ALK PHOS: 64 U/L (ref 39–117)
ALT: 55 U/L — AB (ref 0–35)
AST: 46 U/L — ABNORMAL HIGH (ref 0–37)
Albumin: 3.4 g/dL — ABNORMAL LOW (ref 3.5–5.2)
BUN: 10 mg/dL (ref 6–23)
CO2: 28 meq/L (ref 19–32)
Calcium: 9.4 mg/dL (ref 8.4–10.5)
Chloride: 103 mEq/L (ref 96–112)
Creatinine, Ser: 0.8 mg/dL (ref 0.50–1.10)
GFR calc Af Amer: >90 mL/min (ref >90)
GLUCOSE: 81 mg/dL (ref 70–99)
POTASSIUM: 4.8 meq/L (ref 3.7–5.3)
SODIUM: 140 meq/L (ref 137–147)
Total Bilirubin: 0.3 mg/dL (ref 0.3–1.2)
Total Protein: 6.7 g/dL (ref 6.0–8.3)

## 2014-02-03 LAB — PREGNANCY, URINE: Preg Test, Ur: NEGATIVE

## 2014-02-03 LAB — TYPE AND SCREEN
ABO/RH(D): B POS
Antibody Screen: NEGATIVE

## 2014-02-03 LAB — ABO/RH: ABO/RH(D): B POS

## 2014-02-03 SURGERY — DILATATION & CURETTAGE/HYSTEROSCOPY WITH RESECTOCOPE
Anesthesia: General | Site: Vagina

## 2014-02-03 MED ORDER — IBUPROFEN 600 MG PO TABS
600.0000 mg | ORAL_TABLET | Freq: Four times a day (QID) | ORAL | Status: DC | PRN
  Administered 2014-02-04 – 2014-02-05 (×4): 600 mg via ORAL
  Filled 2014-02-03 (×4): qty 1

## 2014-02-03 MED ORDER — ONDANSETRON HCL 4 MG/2ML IJ SOLN
4.0000 mg | Freq: Four times a day (QID) | INTRAMUSCULAR | Status: DC | PRN
  Administered 2014-02-03: 4 mg via INTRAVENOUS
  Filled 2014-02-03: qty 2

## 2014-02-03 MED ORDER — LACTATED RINGERS IV SOLN
INTRAVENOUS | Status: DC
  Administered 2014-02-03 (×3): via INTRAVENOUS

## 2014-02-03 MED ORDER — HYDROMORPHONE 0.3 MG/ML IV SOLN
INTRAVENOUS | Status: DC
  Administered 2014-02-03: 1.8 mg via INTRAVENOUS
  Administered 2014-02-03: 19:00:00 via INTRAVENOUS
  Administered 2014-02-04: 1.5 mg via INTRAVENOUS
  Administered 2014-02-04: 1.2 mg via INTRAVENOUS
  Administered 2014-02-04: 2.9 mg via INTRAVENOUS
  Filled 2014-02-03: qty 25

## 2014-02-03 MED ORDER — PROPOFOL 10 MG/ML IV EMUL
INTRAVENOUS | Status: AC
  Filled 2014-02-03: qty 20

## 2014-02-03 MED ORDER — KETOROLAC TROMETHAMINE 30 MG/ML IJ SOLN
INTRAMUSCULAR | Status: DC | PRN
  Administered 2014-02-03: 30 mg via INTRAVENOUS

## 2014-02-03 MED ORDER — GLYCOPYRROLATE 0.2 MG/ML IJ SOLN
INTRAMUSCULAR | Status: AC
  Filled 2014-02-03: qty 4

## 2014-02-03 MED ORDER — HYDROMORPHONE HCL PF 1 MG/ML IJ SOLN
INTRAMUSCULAR | Status: AC
  Administered 2014-02-03: 0.5 mg via INTRAVENOUS
  Filled 2014-02-03: qty 1

## 2014-02-03 MED ORDER — DEXAMETHASONE SODIUM PHOSPHATE 10 MG/ML IJ SOLN
INTRAMUSCULAR | Status: AC
  Filled 2014-02-03: qty 1

## 2014-02-03 MED ORDER — MIDAZOLAM HCL 5 MG/5ML IJ SOLN
INTRAMUSCULAR | Status: DC | PRN
  Administered 2014-02-03: 2 mg via INTRAVENOUS

## 2014-02-03 MED ORDER — LIDOCAINE HCL 1 % IJ SOLN
INTRAMUSCULAR | Status: AC
  Filled 2014-02-03: qty 20

## 2014-02-03 MED ORDER — VASOPRESSIN 20 UNIT/ML IJ SOLN
INTRAMUSCULAR | Status: AC
  Filled 2014-02-03: qty 1

## 2014-02-03 MED ORDER — FENTANYL CITRATE 0.05 MG/ML IJ SOLN
INTRAMUSCULAR | Status: DC | PRN
  Administered 2014-02-03 (×2): 100 ug via INTRAVENOUS
  Administered 2014-02-03: 50 ug via INTRAVENOUS

## 2014-02-03 MED ORDER — ALBUTEROL SULFATE (2.5 MG/3ML) 0.083% IN NEBU: 2.5000 mg | INHALATION_SOLUTION | Freq: Four times a day (QID) | RESPIRATORY_TRACT | Status: DC | PRN

## 2014-02-03 MED ORDER — METOCLOPRAMIDE HCL 5 MG/ML IJ SOLN
INTRAMUSCULAR | Status: AC
  Filled 2014-02-03: qty 2

## 2014-02-03 MED ORDER — GLYCOPYRROLATE 0.2 MG/ML IJ SOLN
INTRAMUSCULAR | Status: DC | PRN
  Administered 2014-02-03: 0.6 mg via INTRAVENOUS

## 2014-02-03 MED ORDER — LIDOCAINE HCL 1 % IJ SOLN
INTRAMUSCULAR | Status: DC | PRN
  Administered 2014-02-03: 10 mL

## 2014-02-03 MED ORDER — HYDROMORPHONE HCL PF 1 MG/ML IJ SOLN
INTRAMUSCULAR | Status: AC
  Filled 2014-02-03: qty 1

## 2014-02-03 MED ORDER — NEOSTIGMINE METHYLSULFATE 1 MG/ML IJ SOLN
INTRAMUSCULAR | Status: AC
  Filled 2014-02-03: qty 1

## 2014-02-03 MED ORDER — DEXAMETHASONE SODIUM PHOSPHATE 10 MG/ML IJ SOLN
INTRAMUSCULAR | Status: DC | PRN
  Administered 2014-02-03: 10 mg via INTRAVENOUS

## 2014-02-03 MED ORDER — PROPOFOL 10 MG/ML IV BOLUS
INTRAVENOUS | Status: DC | PRN
  Administered 2014-02-03: 150 mg via INTRAVENOUS
  Administered 2014-02-03: 50 mg via INTRAVENOUS

## 2014-02-03 MED ORDER — NEOSTIGMINE METHYLSULFATE 1 MG/ML IJ SOLN
INTRAMUSCULAR | Status: DC | PRN
  Administered 2014-02-03: 3 mg via INTRAVENOUS

## 2014-02-03 MED ORDER — METOCLOPRAMIDE HCL 5 MG/ML IJ SOLN
10.0000 mg | Freq: Once | INTRAMUSCULAR | Status: AC | PRN
  Administered 2014-02-03: 10 mg via INTRAVENOUS

## 2014-02-03 MED ORDER — MEPERIDINE HCL 25 MG/ML IJ SOLN: 6.2500 mg | INTRAMUSCULAR | Status: DC | PRN

## 2014-02-03 MED ORDER — SODIUM CHLORIDE 0.9 % IJ SOLN
INTRAMUSCULAR | Status: AC
  Filled 2014-02-03: qty 100

## 2014-02-03 MED ORDER — LIDOCAINE HCL (CARDIAC) 20 MG/ML IV SOLN
INTRAVENOUS | Status: DC | PRN
  Administered 2014-02-03: 40 mg via INTRAVENOUS

## 2014-02-03 MED ORDER — OXYCODONE-ACETAMINOPHEN 5-325 MG PO TABS
1.0000 | ORAL_TABLET | ORAL | Status: DC | PRN
  Administered 2014-02-04 (×2): 1 via ORAL
  Filled 2014-02-03 (×2): qty 1

## 2014-02-03 MED ORDER — GLYCINE 1.5 % IR SOLN
Status: DC | PRN
  Administered 2014-02-03: 3000 mL

## 2014-02-03 MED ORDER — ONDANSETRON HCL 4 MG/2ML IJ SOLN
INTRAMUSCULAR | Status: DC | PRN
  Administered 2014-02-03: 4 mg via INTRAVENOUS

## 2014-02-03 MED ORDER — LIDOCAINE HCL (CARDIAC) 20 MG/ML IV SOLN
INTRAVENOUS | Status: AC
  Filled 2014-02-03: qty 5

## 2014-02-03 MED ORDER — LACTATED RINGERS IV SOLN
INTRAVENOUS | Status: DC
  Administered 2014-02-03 – 2014-02-04 (×2): via INTRAVENOUS

## 2014-02-03 MED ORDER — KETOROLAC TROMETHAMINE 30 MG/ML IJ SOLN: 15.0000 mg | Freq: Once | INTRAMUSCULAR | Status: DC | PRN

## 2014-02-03 MED ORDER — KETOROLAC TROMETHAMINE 30 MG/ML IJ SOLN
INTRAMUSCULAR | Status: AC
  Filled 2014-02-03: qty 1

## 2014-02-03 MED ORDER — FENTANYL CITRATE 0.05 MG/ML IJ SOLN
INTRAMUSCULAR | Status: AC
  Filled 2014-02-03: qty 5

## 2014-02-03 MED ORDER — ROCURONIUM BROMIDE 100 MG/10ML IV SOLN
INTRAVENOUS | Status: DC | PRN
  Administered 2014-02-03: 10 mg via INTRAVENOUS
  Administered 2014-02-03: 50 mg via INTRAVENOUS
  Administered 2014-02-03: 5 mg via INTRAVENOUS

## 2014-02-03 MED ORDER — MIDAZOLAM HCL 2 MG/2ML IJ SOLN
INTRAMUSCULAR | Status: AC
  Filled 2014-02-03: qty 2

## 2014-02-03 MED ORDER — HYDROMORPHONE HCL PF 1 MG/ML IJ SOLN
INTRAMUSCULAR | Status: DC | PRN
  Administered 2014-02-03 (×2): 0.5 mg via INTRAVENOUS

## 2014-02-03 MED ORDER — 0.9 % SODIUM CHLORIDE (POUR BTL) OPTIME
TOPICAL | Status: DC | PRN
Start: 2014-02-03 — End: 2014-02-03
  Administered 2014-02-03: 2000 mL

## 2014-02-03 MED ORDER — HYDROMORPHONE HCL PF 1 MG/ML IJ SOLN
0.2500 mg | INTRAMUSCULAR | Status: DC | PRN
  Administered 2014-02-03 (×5): 0.5 mg via INTRAVENOUS

## 2014-02-03 MED ORDER — ONDANSETRON HCL 4 MG/2ML IJ SOLN
INTRAMUSCULAR | Status: AC
  Filled 2014-02-03: qty 2

## 2014-02-03 MED ORDER — HYDROMORPHONE HCL PF 1 MG/ML IJ SOLN: 0.2500 mg | INTRAMUSCULAR | Status: DC | PRN

## 2014-02-03 MED ORDER — DIPHENHYDRAMINE HCL 50 MG/ML IJ SOLN: 12.5000 mg | Freq: Four times a day (QID) | INTRAMUSCULAR | Status: DC | PRN

## 2014-02-03 MED ORDER — DIPHENHYDRAMINE HCL 12.5 MG/5ML PO ELIX
12.5000 mg | ORAL_SOLUTION | Freq: Four times a day (QID) | ORAL | Status: DC | PRN
  Administered 2014-02-03 – 2014-02-04 (×2): 12.5 mg via ORAL
  Filled 2014-02-03 (×2): qty 5

## 2014-02-03 MED ORDER — KETOROLAC TROMETHAMINE 30 MG/ML IJ SOLN: 30.0000 mg | Freq: Four times a day (QID) | INTRAMUSCULAR | Status: DC

## 2014-02-03 MED ORDER — NALOXONE HCL 0.4 MG/ML IJ SOLN: 0.4000 mg | INTRAMUSCULAR | Status: DC | PRN

## 2014-02-03 MED ORDER — VASOPRESSIN 20 UNIT/ML IJ SOLN
INTRAVENOUS | Status: DC | PRN
  Administered 2014-02-03: 15:00:00 via INTRAMUSCULAR

## 2014-02-03 MED ORDER — SODIUM CHLORIDE 0.9 % IJ SOLN: 9.0000 mL | INTRAMUSCULAR | Status: DC | PRN

## 2014-02-03 MED ORDER — BUPIVACAINE HCL (PF) 0.5 % IJ SOLN
INTRAMUSCULAR | Status: AC
  Filled 2014-02-03: qty 30

## 2014-02-03 SURGICAL SUPPLY — 65 items
ADH SKN CLS APL DERMABOND .7 (GAUZE/BANDAGES/DRESSINGS) ×2
BARRIER ADHS 3X4 INTERCEED (GAUZE/BANDAGES/DRESSINGS) ×1 IMPLANT
BRR ADH 4X3 ABS CNTRL BYND (GAUZE/BANDAGES/DRESSINGS) ×2
CANISTER SUCT 3000ML (MISCELLANEOUS) ×3 IMPLANT
CATH ROBINSON RED A/P 16FR (CATHETERS) ×3 IMPLANT
CATH SILICONE 16FRX5CC (CATHETERS) ×1 IMPLANT
CHLORAPREP W/TINT 26ML (MISCELLANEOUS) ×3 IMPLANT
CLOTH BEACON ORANGE TIMEOUT ST (SAFETY) ×3 IMPLANT
CONT PATH 16OZ SNAP LID 3702 (MISCELLANEOUS) ×3 IMPLANT
CONTAINER PREFILL 10% NBF 60ML (FORM) ×6 IMPLANT
DECANTER SPIKE VIAL GLASS SM (MISCELLANEOUS) ×3 IMPLANT
DERMABOND ADVANCED (GAUZE/BANDAGES/DRESSINGS) ×1
DERMABOND ADVANCED .7 DNX12 (GAUZE/BANDAGES/DRESSINGS) IMPLANT
DRAIN JACKSON PRT FLT 7MM (DRAIN) IMPLANT
DRAPE HYSTEROSCOPY (DRAPE) ×3 IMPLANT
DRSG OPSITE POSTOP 4X10 (GAUZE/BANDAGES/DRESSINGS) ×1 IMPLANT
DRSG TELFA 3X8 NADH (GAUZE/BANDAGES/DRESSINGS) ×3 IMPLANT
ELECT CAUTERY BLADE 6.4 (BLADE) IMPLANT
ELECT NDL TIP 2.8 STRL (NEEDLE) IMPLANT
ELECT NEEDLE TIP 2.8 STRL (NEEDLE) IMPLANT
ELECT REM PT RETURN 9FT ADLT (ELECTROSURGICAL) ×3
ELECTRODE REM PT RTRN 9FT ADLT (ELECTROSURGICAL) ×2 IMPLANT
EVACUATOR SILICONE 100CC (DRAIN) IMPLANT
GAUZE SPONGE 4X4 16PLY XRAY LF (GAUZE/BANDAGES/DRESSINGS) ×3 IMPLANT
GLOVE BIO SURGEON STRL SZ7.5 (GLOVE) ×3 IMPLANT
GLOVE BIOGEL PI IND STRL 7.5 (GLOVE) ×2 IMPLANT
GLOVE BIOGEL PI INDICATOR 7.5 (GLOVE) ×1
GOWN STRL REUS W/TWL LRG LVL3 (GOWN DISPOSABLE) ×6 IMPLANT
HEMOSTAT SURGICEL 2X14 (HEMOSTASIS) ×1 IMPLANT
LOOP ANGLED CUTTING 22FR (CUTTING LOOP) IMPLANT
NDL HYPO 25X1 1.5 SAFETY (NEEDLE) ×2 IMPLANT
NDL SPNL 22GX3.5 QUINCKE BK (NEEDLE) ×2 IMPLANT
NEEDLE HYPO 25X1 1.5 SAFETY (NEEDLE) ×3 IMPLANT
NEEDLE SPNL 22GX3.5 QUINCKE BK (NEEDLE) ×3 IMPLANT
NS IRRIG 1000ML POUR BTL (IV SOLUTION) ×3 IMPLANT
PACK ABDOMINAL GYN (CUSTOM PROCEDURE TRAY) ×3 IMPLANT
PACK VAGINAL MINOR WOMEN LF (CUSTOM PROCEDURE TRAY) ×3 IMPLANT
PAD DRESSING TELFA 3X8 NADH (GAUZE/BANDAGES/DRESSINGS) ×2 IMPLANT
PAD OB MATERNITY 4.3X12.25 (PERSONAL CARE ITEMS) ×3 IMPLANT
SET TUBING HYSTEROSCOPY 2 NDL (TUBING) ×1 IMPLANT
SPONGE SURGIFOAM ABS GEL 12-7 (HEMOSTASIS) IMPLANT
STAPLER VISISTAT 35W (STAPLE) IMPLANT
STRIP CLOSURE SKIN 1/2X4 (GAUZE/BANDAGES/DRESSINGS) ×3 IMPLANT
SUT CHROMIC 2 0 CT 1 (SUTURE) ×4 IMPLANT
SUT MNCRL AB 3-0 PS2 27 (SUTURE) ×1 IMPLANT
SUT PDS AB 1 CTX 36 (SUTURE) IMPLANT
SUT PLAIN 2 0 XLH (SUTURE) ×4 IMPLANT
SUT SILK 3 0 FS 1X18 (SUTURE) IMPLANT
SUT VIC AB 0 CT1 18XCR BRD8 (SUTURE) IMPLANT
SUT VIC AB 0 CT1 27 (SUTURE) ×6
SUT VIC AB 0 CT1 27XBRD ANBCTR (SUTURE) ×8 IMPLANT
SUT VIC AB 0 CT1 8-18 (SUTURE)
SUT VIC AB 0 CTX 36 (SUTURE)
SUT VIC AB 0 CTX36XBRD ANBCTRL (SUTURE) IMPLANT
SUT VIC AB 2-0 CT1 27 (SUTURE) ×6
SUT VIC AB 2-0 CT1 TAPERPNT 27 (SUTURE) IMPLANT
SUT VIC AB 2-0 SH 27 (SUTURE) ×6
SUT VIC AB 2-0 SH 27XBRD (SUTURE) IMPLANT
SUT VIC AB 3-0 SH 27 (SUTURE) ×9
SUT VIC AB 3-0 SH 27X BRD (SUTURE) ×10 IMPLANT
SYR CONTROL 10ML LL (SYRINGE) ×3 IMPLANT
TOWEL OR 17X24 6PK STRL BLUE (TOWEL DISPOSABLE) ×6 IMPLANT
TRAY FOLEY CATH 14FR (SET/KITS/TRAYS/PACK) ×3 IMPLANT
TUBE HYSTEROSCOPY W Y-CONNECT (TUBING) ×1 IMPLANT
WATER STERILE IRR 1000ML POUR (IV SOLUTION) ×3 IMPLANT

## 2014-02-03 NOTE — Anesthesia Postprocedure Evaluation (Signed)
  Anesthesia Post-op Note  Anesthesia Post Note  Patient: Shannon [name]  Procedure(s) Performed: Procedure(s) (LRB): DILATATION & CURETTAGE/HYSTEROSCOPY  (N/A) MYOMECTOMY  (N/A)  Anesthesia type: General  Patient location: PACU  Post pain: Pain level controlled  Post assessment: Post-op Vital signs reviewed  Last Vitals:  Filed Vitals:   02/03/14 1630  BP: 129/77  Pulse: 84  Temp:   Resp: 27    Post vital signs: Reviewed  Level of consciousness: sedated  Complications: No apparent anesthesia complications

## 2014-02-03 NOTE — Transfer of Care (Signed)
Immediate Anesthesia Transfer of Care Note  Patient: Shannon [name]  Procedure(s) Performed: Procedure(s): DILATATION & CURETTAGE/HYSTEROSCOPY  (N/A) MYOMECTOMY  (N/A)  Patient Location: PACU  Anesthesia Type:General  Level of Consciousness: sedated  Airway & Oxygen Therapy: Patient Spontanous Breathing and Patient connected to nasal cannula oxygen  Post-op Assessment: Report given to PACU RN and Post -op Vital signs reviewed and stable  Post vital signs: stable  Complications: No apparent anesthesia complications

## 2014-02-03 NOTE — Anesthesia Preprocedure Evaluation (Addendum)
Anesthesia Evaluation  Patient identified by MRN, date of birth, ID band Patient awake    Reviewed: Allergy & Precautions, H&P , Patient's Chart, lab work & pertinent test results, reviewed documented beta blocker date and time   History of Anesthesia Complications Negative for: history of anesthetic complications  Airway Mallampati: II TM Distance: >3 FB Neck ROM: full    Dental   Pulmonary asthma ,  breath sounds clear to auscultation        Cardiovascular Exercise Tolerance: Good Rhythm:regular Rate:Normal     Neuro/Psych  Headaches, negative psych ROS   GI/Hepatic   Endo/Other    Renal/GU      Musculoskeletal   Abdominal   Peds  Hematology   Anesthesia Other Findings   Reproductive/Obstetrics                           Anesthesia Physical Anesthesia Plan  ASA: II  Anesthesia Plan: General ETT   Post-op Pain Management:    Induction:   Airway Management Planned:   Additional Equipment:   Intra-op Plan:   Post-operative Plan:   Informed Consent: I have reviewed the patients History and Physical, chart, labs and discussed the procedure including the risks, benefits and alternatives for the proposed anesthesia with the patient or authorized representative who has indicated his/her understanding and acceptance.   Dental Advisory Given  Plan Discussed with: CRNA, Surgeon and Anesthesiologist  Anesthesia Plan Comments:        Anesthesia Quick Evaluation

## 2014-02-03 NOTE — Interval H&P Note (Signed)
History and Physical Interval Note:  02/03/2014 1:09 PM  Shannon [name]  has presented today for surgery, with the diagnosis of Uterine Fibroids, uterine polyp  The various methods of treatment have been discussed with the patient and family. After consideration of risks, benefits and other options for treatment, the patient has consented to  Procedure(s): DILATATION & CURETTAGE/HYSTEROSCOPY WITH RESECTOCOPE (N/A) ABDOMINAL MYOMECTOMY (N/A) as a surgical intervention .  The patient's history has been reviewed, patient examined, no change in status, stable for surgery.  I have reviewed the patient's chart and labs.  Questions were answered to the patient's satisfaction.    The H&P does not reflect the hysteroscopy, D&C, possible resection but that will be done as well secondary to ? polyp in the uterus.  This was discussed with the patient in the office and again today and she is in agreement.  R/B/A reviewed and consent signed, initialed and witnessed.   Delice Lesch

## 2014-02-03 NOTE — Op Note (Signed)
Preop Diagnosis: Uterine Fibroids, uterine polyp   Postop Diagnosis: Uterine fibroids, uterine polyp   Procedure: DILATATION & CURETTAGE/HYSTEROSCOPY WITH RESECTOCOPE ABDOMINAL MYOMECTOMY   Anesthesia: General   Anesthesiologist: Assunta Gambles, MD   Attending: Delice Lesch, MD   Assistant: Crawford Givens, MD  Findings: Multiple uterine fibroids (6 removed)  largest approximately 7cm.  Uterus sounded to 11cm.  No obvious polyp visualized in the cavity.  The endometrial cavity was not entered.  Pathology: Fibroids and serosa  Fluids: 2300 cc  UOP: 325 cc  EBL: 073 cc  Complications: None  Procedure: The patient was taken to the operating room after the risks, benefits and alternatives were discussed with the patient. The patient verbalized understanding and consent signed and witnessed. The patient was placed under general anesthesia with an LMA per anesthesiologist and prepped and draped in the normal sterile fashion.  Time Out was performed per protocol.  A bivalve speculum was placed in the patient's vagina and the anterior lip of the cervix was grasped with a single tooth tenaculum. A paracervical block was administered using a total of 10 cc of 1% lidocaine. The uterus sounded to 11 cm. The cervix was dilated for passage of the hysteroscope.  The hysteroscope was introduced into the uterine cavity and findings as noted above. Sharp curettage was performed until a gritty texture was noted and curettings were sent to pathology. The hysteroscope was reintroduced and no obvious remaining intracavitary lesions were noted.  All instruments were removed.  Foley was then placed to gravity.  Attention was then turned to the abdomen where a pfannenstiel skin incision was made and carried down to the underlying fascia.  The fascial incision was extended bilaterally with the Mayo scissors.  The inferior aspect of the fascial incision was grasped with Kocher clamps and the fascia excised  from the rectus muscle.  The same was done on the superior aspect of the fascial incision.  The muscle was separated in the midline and the peritoneum entered bluntly and extended manually.  The bowel was packed away with 3 moist laparotomy sponges and the Balfour retractor placed with the bladder blade.  A towel clamp was placed on the fundal fibroid and the uterus elevated up to the incision.  Vasopressin at a concentration of 20 units of vasopressin in 100cc of normal saline was injected into the fundal fibroid and the fibroid shelled out using a hemostat and bovie for cautery.  The same was done for the remaining fibroids.  All beds were repaired with 2-0 vicryl and the serosa was reapproximated with 3-0 vicryl.  Copious irrigation was performed.  Intercede was placed on all incisions.  One incision was at the fundus.  Two were on the posterior wall of the uterus and one was on the anterior lower uterine segment.  The endometrial cavity was not entered.  Laparotomy sponges were removed and the peritoneum repaired with 2-0 chromic.  The fascia was repaired with 0 vicryl.  The subcutaneous tissue was irrigated and made hemostatic with the bovie and then reapproximated with 2-0 plain.  The skin was reapproximated with 3-0 monocryl and dermabond applied intermittently along the extent of the incision.  Honeycomb dressing placed.  All instruments were removed. Sponge lap and needle count was correct. The patient tolerated the procedure well and was returned to the recovery room in good condition.

## 2014-02-03 NOTE — Interval H&P Note (Signed)
History and Physical Interval Note:  02/03/2014 1:07 PM  Shannon [name]  has presented today for surgery, with the diagnosis of Uterine Fibroids, uterine polyp  The various methods of treatment have been discussed with the patient and family. After consideration of risks, benefits and other options for treatment, the patient has consented to  Procedure(s): DILATATION & CURETTAGE/HYSTEROSCOPY WITH RESECTOCOPE (N/A) ABDOMINAL MYOMECTOMY (N/A) as a surgical intervention .  The patient's history has been reviewed, patient examined, no change in status, stable for surgery.  I have reviewed the patient's chart and labs.  Questions were answered to the patient's satisfaction.     Delice Lesch

## 2014-02-03 NOTE — H&P (View-Only) (Signed)
Shannon [name] is a 33 y.o. 33 yo female P 1-0-1-1 presents for myomectomy because of menorrhagia and symptomatic uterine fibroids.  For the past several years the patient has had worsening menorrhagia and pelvic pain that has become severe in the past 4 months.  Menses lasts for 8 days with pad change hourly and at times more often due to "gushing" of blood episodes.  This flow is accompanied by large clots and cramping rated at 10/10 on a 10 point pain scale that no longer responds to NSAIDS and requires narcotic analgesia.  Patient goes on to report that her pain also occurs with the same intensity when she is not having her period.  Often she has had to leave or miss work due to the pain.  She admits to pain with bowel movements, dyspareunia and upper thigh pain with a numbing sensation.  She denies any urinary tract symptoms or vaginitis symptoms but has noticed excess vaginal discharge requiring a daily panty liner.  A pelvic ultrasound/sono-hysterogram in March 2015 showed: uterus 7.73 x 9.22 x 6.667 cm, endometrium: 0.363 cm with ? polyp on posterior cavity wall-vessel seen by color flow Doppler at the mass base measuring 1.3 x 0.54 x 0.86 cm;  #3 fibroids were noted: #2 sub-serosal: right 7.0 x 5.5 x 6.1 cm and left-lower uterine segment 2.9 x 2.7 x 3.5 cm and a right anterior intramural 2.4 x 1.5 x 2.4 cm.  Patient had a normal CBC, TSH and Prolactin.  A review of both medical and surgical management options were given to the patient however, since she wants to preserve fertility and pursue pregnancy as soon as possible she has opted for a myomectomy.   Past Medical History  OB History: G 2:  P:  1-0-1-1;   SVB 2002  GYN History: menarche:33 YO    LMP: 01/01/2014;  Contracepton: None;  No history of STD,   History of abnormal PAP smear 2010 but repeated and returned normal   Last PAP smear: 3//2015  Medical History: Asthma, Hypertension, Anemia and PMDD  Surgical History: 2006   Tonsillectomy-Adenoidectomy Admits to  problems with anesthesia in that she is difficult to awaken and awakens in a very aggressive state.  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 Ceftriaxone Sodium Other (See Comments)    unknown  . Diflucan [Fluconazole] Hives    Raw rash on abdomen and she had sores and swelling in mouth   . Eggs Or Egg-Derived Products Nausea And Vomiting  . Latex Rash    Denies sensitivity to peanuts, shellfish, soy, or adhesives.  ROS: Admits to glasses but  denies headache, vision changes, nasal congestion, dysphagia, tinnitus, dizziness, hoarseness, cough,  chest pain, shortness of breath, nausea, vomiting, diarrhea,constipation,  urinary frequency, urgency  dysuria, hematuria, vaginitis symptoms, pelvic pain, swelling of joints,easy bruising,  myalgias, arthralgias, skin rashes, unexplained weight loss and except as is mentioned in the history of present illness, patient's review of systems is otherwise negative.   Physical Exam  Bp: 114/74   P: 86  R: 16  Temperature: 98.6  degrees F orally Weight: 202 lbs.  Height: 5\' 4"     BMI: 34.7  Neck: supple without  masses or thyromegaly Lungs: clear to auscultation Heart: regular rate and rhythm Abdomen: soft, tender without guarding , firm mass from pelvis to 2 fingers above symphysis pubis  and no organomegaly Pelvic:EGBUS- wnl; vagina-normal rugae; uterus-12-14 weeks size, irregular and tender, cervix without lesions or motion tenderness; adnexae-no tenderness or masses Extremities:  no clubbing, cyanosis or edema   Assesment: Symptomatic Uterine Fibroids            Menorrhagia   Disposition:  A  discussion was held with patient regarding the indication for her procedure(s) along with the risks, which include but are not limited to: reaction to anesthesia, damage to adjacent organs, infection,  excessive bleeding and if endometrial cavity is entered she may require a C-section for any deliveries.  Patient verbalized understanding of these risks and has consented to proceed with an Abdominal Myomectomy at [place] [facility] on February 03, 2014 at 1 p.m.    CSN# [id number]   [name] J. Florene Glen, PA-C  for Dr. Harvie Bridge. Mancel Bale

## 2014-02-03 NOTE — H&P (View-Only) (Signed)
Shannon [name] is a 33 y.o. 33 yo female P 1-0-1-1 presents for myomectomy because of menorrhagia and symptomatic uterine fibroids.  For the past several years the patient has had worsening menorrhagia and pelvic pain that has become severe in the past 4 months.  Menses lasts for 8 days with pad change hourly and at times more often due to "gushing" of blood episodes.  This flow is accompanied by large clots and cramping rated at 10/10 on a 10 point pain scale that no longer responds to NSAIDS and requires narcotic analgesia.  Patient goes on to report that her pain also occurs with the same intensity when she is not having her period.  Often she has had to leave or miss work due to the pain.  She admits to pain with bowel movements, dyspareunia and upper thigh pain with a numbing sensation.  She denies any urinary tract symptoms or vaginitis symptoms but has noticed excess vaginal discharge requiring a daily panty liner.  A pelvic ultrasound/sono-hysterogram in March 2015 showed: uterus 7.73 x 9.22 x 6.667 cm, endometrium: 0.363 cm with ? polyp on posterior cavity wall-vessel seen by color flow Doppler at the mass base measuring 1.3 x 0.54 x 0.86 cm;  #3 fibroids were noted: #2 sub-serosal: right 7.0 x 5.5 x 6.1 cm and left-lower uterine segment 2.9 x 2.7 x 3.5 cm and a right anterior intramural 2.4 x 1.5 x 2.4 cm.  Patient had a normal CBC, TSH and Prolactin.  A review of both medical and surgical management options were given to the patient however, since she wants to preserve fertility and pursue pregnancy as soon as possible she has opted for a myomectomy.   Past Medical History  OB History: G 2:  P:  1-0-1-1;   SVB 2002  GYN History: menarche:33 YO    LMP    Contracepton: None;  No history of STD,  History of abnormal PAP smear 2010 but repeated and returned normal   Last PAP smear 01/01/2014  Medical History: Asthma, Hypertension, Anemia and PMDD  Surgical History: 2006   Tonsillectomy-Adenoidectomy Admits to  problems with anesthesia in that she is difficult to awaken and awakens in a very aggressive state.  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 Ceftriaxone Sodium Other (See Comments)    unknown  . Diflucan [Fluconazole] Hives    Raw rash on abdomen and she had sores and swelling in mouth   . Eggs Or Egg-Derived Products Nausea And Vomiting  . Latex Rash    Denies sensitivity to peanuts, shellfish, soy, or adhesives.  ROS: Admits to glasses but  denies headache, vision changes, nasal congestion, dysphagia, tinnitus, dizziness, hoarseness, cough,  chest pain, shortness of breath, nausea, vomiting, diarrhea,constipation,  urinary frequency, urgency  dysuria, hematuria, vaginitis symptoms, pelvic pain, swelling of joints,easy bruising,  myalgias, arthralgias, skin rashes, unexplained weight loss and except as is mentioned in the history of present illness, patient's review of systems is otherwise negative.   Physical Exam  Bp: 114/74     P:  86      R:  16  Temperature:  98.6 degrees F orally   Weight: 202 lbs.  Height:  5\' 4"    BMI: 34.7  Neck: supple without masses or thyromegaly Lungs: clear to auscultation Heart: regular rate and rhythm Abdomen: soft, tender without guarding , firm mass from pelvis to 2 fingers above symphysis pubis  and no organomegaly Pelvic:EGBUS- wnl; vagina-normal rugae; uterus-12-14 weeks size, irregular and tender, cervix without lesions or motion tenderness; adnexae-no tenderness or masses Extremities:  no clubbing, cyanosis or edema   Assesment: Symptomatic Uterine Fibroids             Menorrhagia   Disposition:  A discussion was held with patient regarding the indication for her procedure(s) along with the risks, which include but are not limited to: reaction to anesthesia, damage to adjacent organs, infection,  excessive bleeding and if endometrial cavity is entered she may require a C-section for any deliveries.  Patient verbalized understanding of these risks and has consented to proceed with an Abdominal Myomectomy at [facility] on February 03, 2014 at 1 p.m.    CSN# [id number]   Khadar Monger J. Florene Glen, PA-C  for Dr. Harvie Bridge. Mancel Bale

## 2014-02-03 NOTE — Progress Notes (Signed)
Day of Surgery Procedure(s) (LRB): DILATATION & CURETTAGE/HYSTEROSCOPY  (N/A) ABDOMINAL MYOMECTOMY  (N/A)  Subjective: Patient reports no problems.  No N/V.  Objective: I have reviewed patient's vital signs. UOP 225cc/1.5hrs  General: alert and no distress Resp: clear to auscultation bilaterally Cardio: regular rate and rhythm Abdomen: soft, decreased bowel sounds, dressing c/d/i Extremities: Homans sign is negative, no sign of DVT, scds are on No vaginal bleeding  Assessment: s/p Procedure(s): DILATATION & CURETTAGE/HYSTEROSCOPY  (N/A) MYOMECTOMY  (N/A): stable  Plan: POD# 0 Cont post op care SCDs for DVT prophylaxis Encourage IS Adequate UOP   LOS: 0 days    Delice Lesch 02/03/2014, 8:00 PM

## 2014-02-04 ENCOUNTER — Encounter (HOSPITAL_COMMUNITY): Payer: Self-pay | Admitting: Obstetrics and Gynecology

## 2014-02-04 LAB — COMPREHENSIVE METABOLIC PANEL
ALBUMIN: 2.9 g/dL — AB (ref 3.5–5.2)
ALK PHOS: 56 U/L (ref 39–117)
ALT: 49 U/L — ABNORMAL HIGH (ref 0–35)
AST: 37 U/L (ref 0–37)
BILIRUBIN TOTAL: 0.3 mg/dL (ref 0.3–1.2)
BUN: 9 mg/dL (ref 6–23)
CHLORIDE: 102 meq/L (ref 96–112)
CO2: 25 mEq/L (ref 19–32)
Calcium: 9.3 mg/dL (ref 8.4–10.5)
Creatinine, Ser: 0.76 mg/dL (ref 0.50–1.10)
GFR calc Af Amer: >90 mL/min (ref >90)
GFR calc non Af Amer: >90 mL/min (ref >90)
Glucose, Bld: 118 mg/dL — ABNORMAL HIGH (ref 70–99)
POTASSIUM: 4.9 meq/L (ref 3.7–5.3)
SODIUM: 137 meq/L (ref 137–147)
Total Protein: 5.6 g/dL — ABNORMAL LOW (ref 6.0–8.3)

## 2014-02-04 LAB — CBC
HCT: 34.2 % — ABNORMAL LOW (ref 36.0–46.0)
HEMOGLOBIN: 11.3 g/dL — AB (ref 12.0–15.0)
MCH: 26.9 pg (ref 26.0–34.0)
MCHC: 33 g/dL (ref 30.0–36.0)
MCV: 81.4 fL (ref 78.0–100.0)
Platelets: 204 10*3/uL (ref 150–400)
RBC: 4.2 MIL/uL (ref 3.87–5.11)
RDW: 14.4 % (ref 11.5–15.5)
WBC: 10.7 10*3/uL — AB (ref 4.0–10.5)

## 2014-02-04 MED ORDER — DIPHENHYDRAMINE HCL 25 MG PO CAPS
25.0000 mg | ORAL_CAPSULE | Freq: Four times a day (QID) | ORAL | Status: DC | PRN
  Filled 2014-02-04: qty 1

## 2014-02-04 MED ORDER — HYDROMORPHONE HCL 2 MG PO TABS
2.0000 mg | ORAL_TABLET | ORAL | Status: DC | PRN
  Administered 2014-02-04 – 2014-02-05 (×6): 4 mg via ORAL
  Filled 2014-02-04 (×2): qty 2
  Filled 2014-02-04: qty 1
  Filled 2014-02-04 (×3): qty 2
  Filled 2014-02-04: qty 1

## 2014-02-04 NOTE — Anesthesia Postprocedure Evaluation (Signed)
  Anesthesia Post-op Note  Patient: Shannon [name]  Procedure(s) Performed: Procedure(s): DILATATION & CURETTAGE/HYSTEROSCOPY  (N/A) MYOMECTOMY  (N/A)  Patient Location: [facility]  Anesthesia Type:General  Level of Consciousness: awake  Airway and Oxygen Therapy: Patient Spontanous Breathing  Post-op Pain: mild  Post-op Assessment: Patient's Cardiovascular Status Stable and Respiratory Function Stable  Post-op Vital Signs: stable  Last Vitals:  Filed Vitals:   02/04/14 0545  BP:   Pulse:   Temp:   Resp: 16    Complications: No apparent anesthesia complications

## 2014-02-04 NOTE — Addendum Note (Signed)
Addendum created 02/04/14 0734 by Ignacia Bayley, CRNA   Modules edited: Notes Section   Notes Section:  File: [id number]

## 2014-02-04 NOTE — Progress Notes (Signed)
Shannon [name] is a33 y.o.  [id number]  Post Op Date #1:  S/P Abdominal and Hysteroscopic Myomectomy  Subjective: Patient is Doing well postoperatively. Patient has pain rated as 6/10, tolerating liquids and peanut butter crackers, has ambulated and denies nausea or dizziness.. Foley remains.   Objective: Vital signs in last 24 hours: Temp:  [97.7 F (36.5 C)-98.6 F (37 C)] 97.9 F (36.6 C) (04/16 0540) Pulse Rate:  [80-102] 83 (04/16 0540) Resp:  [12-27] 16 (04/16 0545) BP: (118-138)/(68-89) 118/68 mmHg (04/16 0540) SpO2:  [9 %-100 %] 97 % (04/16 0545) Weight:  [195 lb (88.451 kg)] 195 lb (88.451 kg) (04/15 1830)  Intake/Output from previous day: 04/15 0701 - 04/16 0700 In: 5140.1 [P.O.:910; I.V.:4230.1] Out: 3800 [Urine:3650] Intake/Output this shift: Total I/O In: -  Out: 300 [Urine:300]  Recent Labs Lab 02/03/14 1152 02/04/14 0530  WBC 6.5 10.7*  HGB 12.7 11.3*  HCT 38.8 34.2*  PLT 223 204     Recent Labs Lab 02/03/14 1152 02/04/14 0530  NA 140 137  K 4.8 4.9  CL 103 102  CO2 28 25  BUN 10 9  CREATININE 0.80 0.76  CALCIUM 9.4 9.3  PROT 6.7 5.6*  BILITOT 0.3 0.3  ALKPHOS 64 56  ALT 55* 49*  AST 46* 37  GLUCOSE 81 118*    EXAM: General: alert, cooperative and no distress Resp: clear to auscultation bilaterally Cardio: regular rate and rhythm, S1, S2 normal, no murmur, click, rub or gallop GI: Bowel sounds present, dressing clean, dry and intact. Extremities: SCD hose in place, no calf tenderness.   Assessment: s/p Procedure(s): DILATATION & CURETTAGE/HYSTEROSCOPY  MYOMECTOMY : stable and progressing well  Plan: Advance diet Encourage ambulation Advance to PO medication Discontinue IV fluids Routine care.  LOS: 1 day    Earnstine Regal, PA-C 02/04/2014 7:21 AM

## 2014-02-05 LAB — COMPREHENSIVE METABOLIC PANEL
ALT: 42 U/L — ABNORMAL HIGH (ref 0–35)
AST: 28 U/L (ref 0–37)
Albumin: 2.7 g/dL — ABNORMAL LOW (ref 3.5–5.2)
Alkaline Phosphatase: 54 U/L (ref 39–117)
BUN: 13 mg/dL (ref 6–23)
CALCIUM: 8.7 mg/dL (ref 8.4–10.5)
CO2: 28 meq/L (ref 19–32)
CREATININE: 0.91 mg/dL (ref 0.50–1.10)
Chloride: 102 mEq/L (ref 96–112)
GFR, EST NON AFRICAN AMERICAN: 82 mL/min — AB (ref >90)
Glucose, Bld: 92 mg/dL (ref 70–99)
Potassium: 4 mEq/L (ref 3.7–5.3)
Sodium: 139 mEq/L (ref 137–147)
TOTAL PROTEIN: 5.3 g/dL — AB (ref 6.0–8.3)
Total Bilirubin: <0.2 mg/dL — ABNORMAL LOW (ref 0.3–1.2)

## 2014-02-05 MED ORDER — IBUPROFEN 600 MG PO TABS: ORAL_TABLET | ORAL | Status: DC

## 2014-02-05 MED ORDER — HYDROMORPHONE HCL 2 MG PO TABS
2.0000 mg | ORAL_TABLET | ORAL | Status: DC | PRN
Start: 2014-02-05 — End: 2015-08-18

## 2014-02-05 MED ORDER — ONDANSETRON HCL 4 MG PO TABS: 4.0000 mg | ORAL_TABLET | Freq: Three times a day (TID) | ORAL | Status: DC | PRN

## 2014-02-05 NOTE — Discharge Instructions (Signed)
Call [facility] @ [phone] if:  You have a temperature greater than or equal to 100.4 degrees Farenheit orally You have pain that is not made better by the pain medication given and taken as directed You have excessive bleeding or problems urinating  Take Colace (Docusate Sodium/Stool Softener) 100 mg 2-3 times daily while taking narcotic pain medicine to avoid constipation or until bowel movements are regular. Take your Ibuprofen 600 mg with food,  every 6 hours for 5 days then as needed for pain Take your iron supplements twice a day for 6 weeks   You may drive after 2 weeks You may walk up steps You may shower  You may resume a regular diet Keep incisions clean and dry  Do not lift over 15 pounds for 6 weeks Avoid anything in vagina for 6 weeks (or until after your post-operative visit)  Follow up with Dr. Mancel Bale, Mar 19, 2014 at 11:30 a.m.

## 2014-02-05 NOTE — Progress Notes (Signed)
Shannon [name] is a33 y.o.  [id number]  Post Op Date # #2 Abdominal Myomectomy and Hysteroscopy  Subjective: Patient is Doing well postoperatively. Continues to complain of being sore.  Tolerating regular diet, ambulating, voiding and no complaints of nausea or dizziness.  Objective: Vital signs in last 24 hours: Temp:  [97.9 F (36.6 C)-98.6 F (37 C)] 97.9 F (36.6 C) (04/17 0536) Pulse Rate:  [80-93] 80 (04/17 0536) Resp:  [16-20] 16 (04/17 0536) BP: (109-134)/(57-74) 134/74 mmHg (04/17 0536) SpO2:  [95 %-100 %] 98 % (04/17 0536)  Intake/Output from previous day: 04/16 0701 - 04/17 0700 In: 975 [I.V.:975] Out: 1750 [Urine:1750] Intake/Output this shift:    Recent Labs Lab 02/03/14 1152 02/04/14 0530  WBC 6.5 10.7*  HGB 12.7 11.3*  HCT 38.8 34.2*  PLT 223 204     Recent Labs Lab 02/03/14 1152 02/04/14 0530 02/05/14 0545  NA 140 137 139  K 4.8 4.9 4.0  CL 103 102 102  CO2 28 25 28   BUN 10 9 13   CREATININE 0.80 0.76 0.91  CALCIUM 9.4 9.3 8.7  PROT 6.7 5.6* 5.3*  BILITOT 0.3 0.3 <0.2*  ALKPHOS 64 56 54  ALT 55* 49* 42*  AST 46* 37 28  GLUCOSE 81 118* 92    EXAM: General: alert and cooperative Resp: clear to auscultation bilaterally Cardio: regular rate and rhythm, S1, S2 normal, no murmur, click, rub or gallop GI: Bowel sounds present, no distention, dressing clean/dry/intact Extremities: Homan's negative and no calf tenderness   Assessment: s/p Procedure(s): DILATATION & CURETTAGE/HYSTEROSCOPY  MYOMECTOMY : progressing well  Plan: Discharge home  LOS: 2 days    Earnstine Regal, PA-C 02/05/2014 7:44 AM

## 2014-02-05 NOTE — Discharge Summary (Signed)
Physician Discharge Summary  Patient ID: Shannon [name] MRN: [id number] DOB/AGE: Mar 21, 1981 33 y.o.  Admit date: 02/03/2014 Discharge date: 02/05/2014   Discharge Diagnoses:  Symptomatic Fibroids and Menorrhagia Active Problems:   S/P myomectomy   Operation: Abdominal Myomectomy, Hysteroscopy, Dilatation and Curettage   Discharged Condition: Good  Hospital Course: On the date of admission the patient underwent the aforementioned procedures, (including  the removal of #6 myomas-largest being 7 cm)  and tolerated them well.   Post operative course was unremarkable with the patient tolerating a hemoglobin of 11.3 and resuming bowel and bladder function by the evening of post operative day #1.  Having received the maximum benefit of her hospital stay,  the patient was discharged home on post operative day #2.   Disposition: 01-Home or Self Care  Discharge Medications:   Dilaudid 2 mg every 4 hours prn Ibuprofen 600 mg with food every 6 hours for 5 days then prn Zofran 4 mg every 8 hours prn Vitamin D 50,000 units twice weekly Iron Supplement twice daily Multivitamin daily    Follow-up: Dr. Harvie Bridge. Mancel Bale,  Mar 19, 2014 at 11:30 a.m.  Signed: Earnstine Regal, PA-C 02/05/2014, 7:48 AM

## 2014-02-05 NOTE — Progress Notes (Signed)
Pt discharged home with husband... Condition stable... No equipment... Taken to car via wheelchair by L. Graylon Good, NT.

## 2014-02-14 IMAGING — CR DG CHEST 2V
2 series · 2 of 2 positions shown · non-contrast
Comparison: 04/12/13

CLINICAL DATA: Chest pain

CHEST - 2 VIEW

[w chest pa]
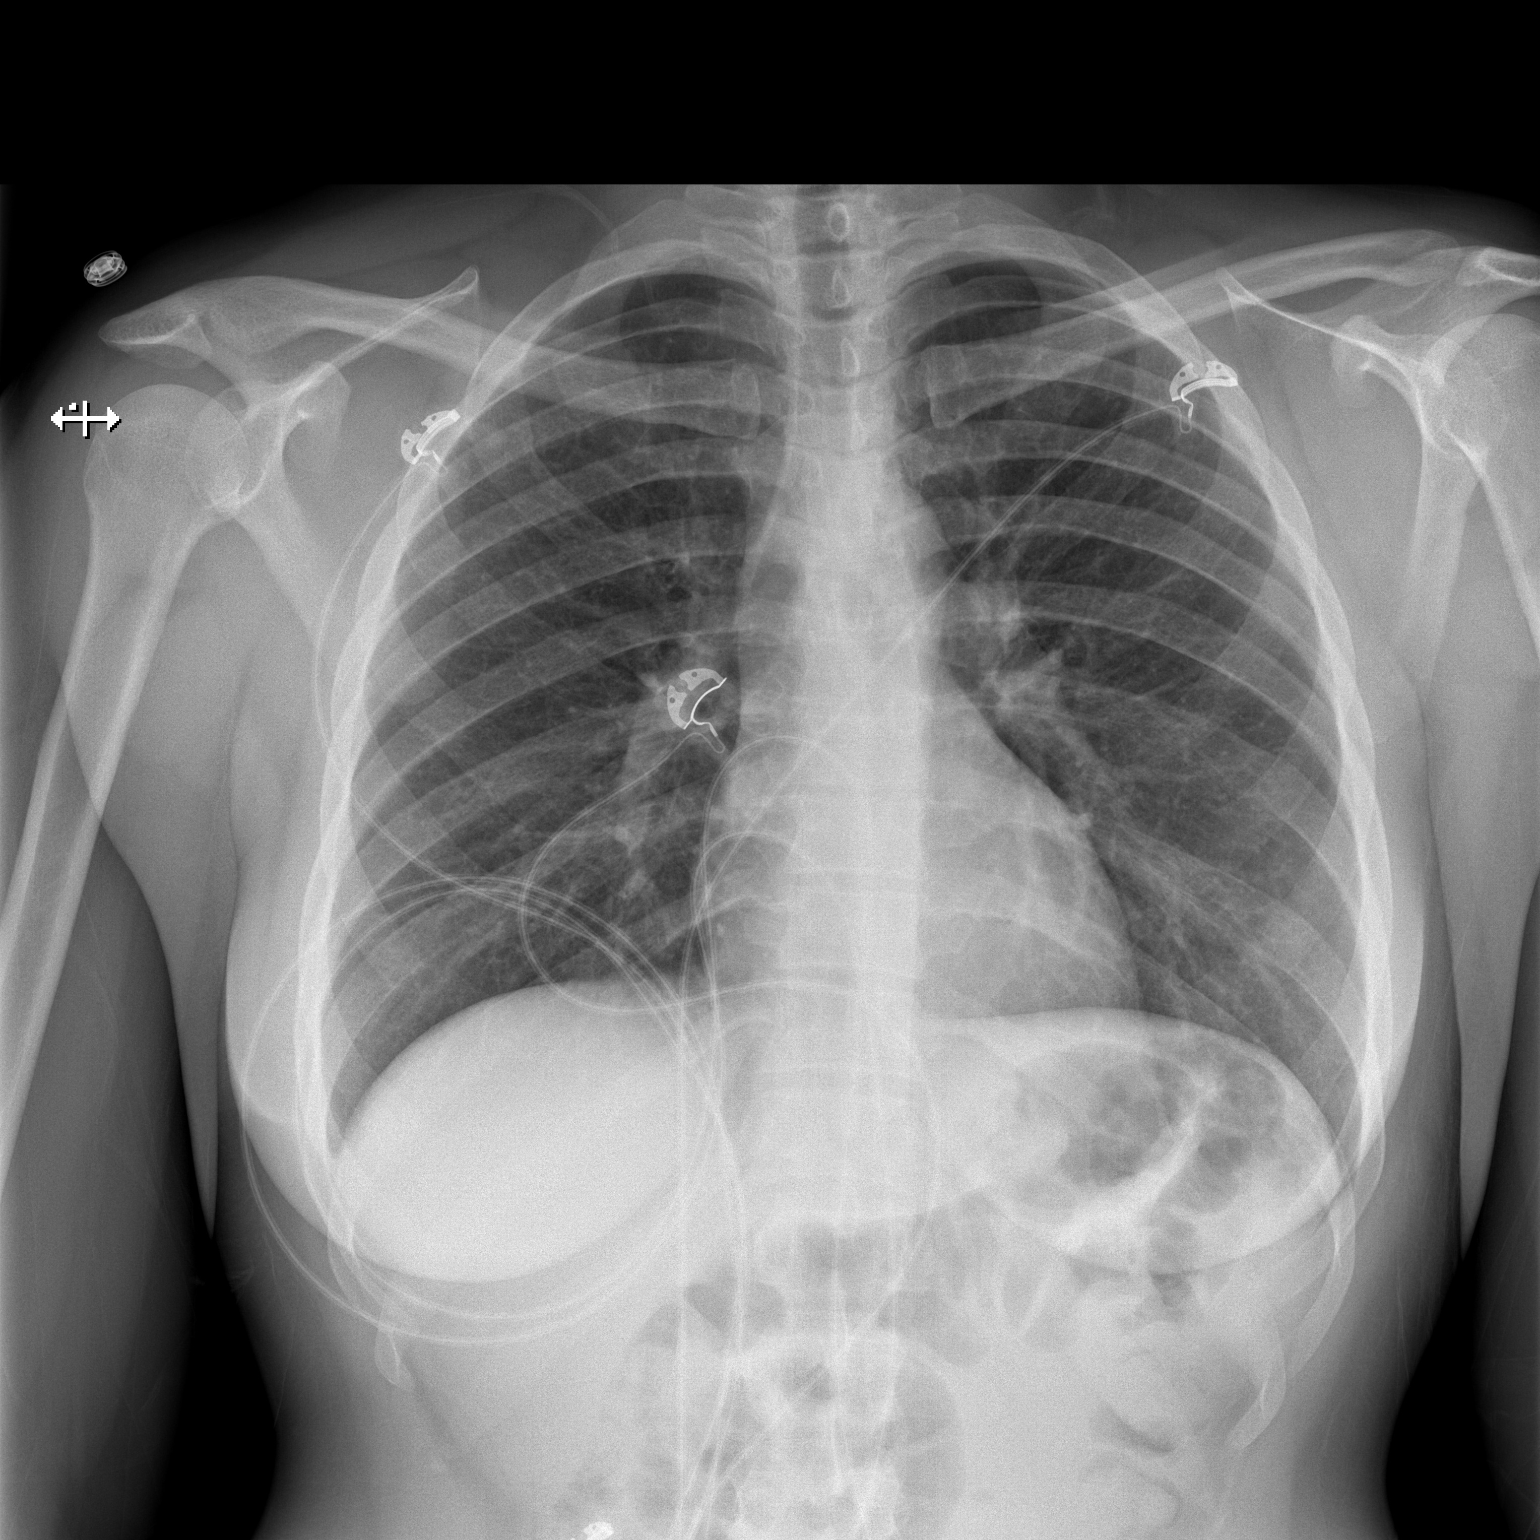

[w chest lat]
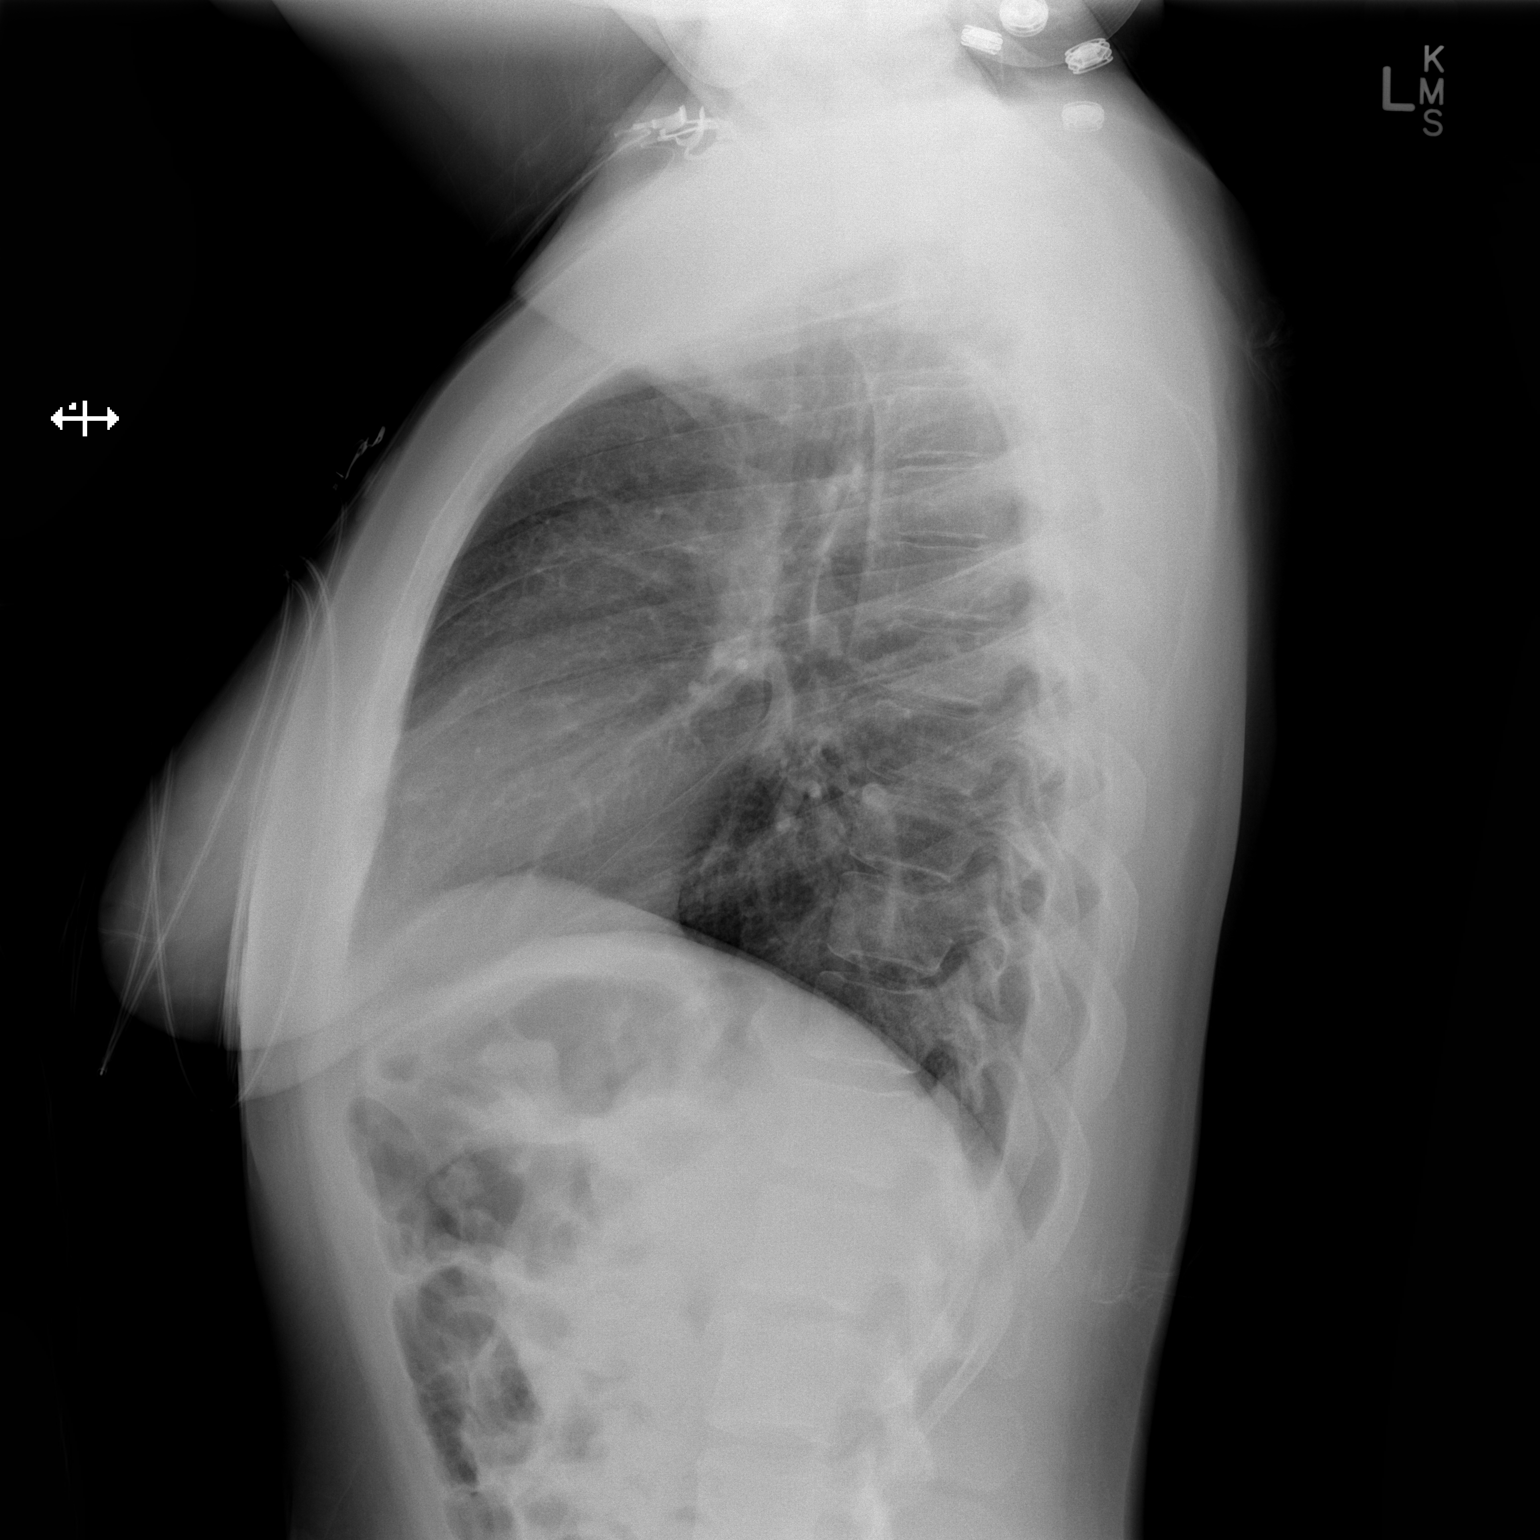

[2 of 2 positions shown; findings below may reference images not displayed]

FINDINGS: Cardiomediastinal silhouette is stable.  No acute
infiltrate or pleural effusion.  No pulmonary edema.  Bony thorax
is unremarkable.
IMPRESSION: No active disease.  No significant change.

## 2014-10-22 HISTORY — PX: SALPINGECTOMY: SHX328

## 2014-10-22 NOTE — L&D Delivery Note (Signed)
   Vaginal Delivery Note The pt utilized an epidural as pain management.   Spontaneous rupture of membranes today, at 1030, clear.  GBS was positive, Vancomycin x1 doses were given.  Cervical dilation was complete at  1758.  NICHD Category 2.     Pushing with guidance began at 1816 .   After  37 minutes of pushing the head, shoulders and the body of a viable female infant "[name] " delivered spontaneously with maternal effort in the ROT position at [place]   With vigorous tone and spontaneous cry, the infant was placed on moms abd.  After the umbilical cord was clamped it was cut by the FOB, then cord blood was obtained for evaluation. Spontaneous delivery of a intact placenta with a 3 vessel cord via Shultz at 1900.   Episiotomy: None    The vulva, perineum, vaginal vault, rectum and cervix were inspected.  A smallsuperficial periurithral tear was noted and did not require repair.  A small vulvar tear was repaired using a 4-0 vicryl on a SH needle.  Lidocaine was used for the repair   Patient tolerated repair well.   Postpartum pitocin as ordered.  Fundus firm, lochia minimum, bleeding under control.   EBL 150 ml, Pt hemodynamically stable.   Sponge, laps and needle count correct and verified with the primary care nurse.  Attending MD available at all times.    Routine postpartum orders   Mother desires postpartum Bilateral tubal ligation for contraception  Mom plans to breastfeed  Infant to have inpatient circumcision   Placenta to pathology: NO     Cord Gases sent to lab: NO Cord blood sent to lab: YES   APGARS:  8 at 1 minute and 9 at 5 minutes Weight:.    Both mom and baby were left in stable condition, baby skin to skin.     Shannon [name], CNM, MSN 08/18/2015. 7:53 PM

## 2015-01-10 LAB — OB RESULTS CONSOLE GC/CHLAMYDIA
Chlamydia: NEGATIVE
GC PROBE AMP, GENITAL: NEGATIVE

## 2015-01-10 LAB — OB RESULTS CONSOLE RUBELLA ANTIBODY, IGM: RUBELLA: IMMUNE

## 2015-01-10 LAB — OB RESULTS CONSOLE HIV ANTIBODY (ROUTINE TESTING): HIV: NONREACTIVE

## 2015-01-10 LAB — OB RESULTS CONSOLE RPR: RPR: NONREACTIVE

## 2015-01-10 LAB — OB RESULTS CONSOLE HEPATITIS B SURFACE ANTIGEN: Hepatitis B Surface Ag: NEGATIVE

## 2015-06-02 LAB — OB RESULTS CONSOLE GBS: STREP GROUP B AG: POSITIVE

## 2015-08-18 ENCOUNTER — Encounter (HOSPITAL_COMMUNITY): Payer: Self-pay | Admitting: *Deleted

## 2015-08-18 ENCOUNTER — Inpatient Hospital Stay (HOSPITAL_COMMUNITY): Payer: BC Managed Care – PPO | Admitting: Anesthesiology

## 2015-08-18 ENCOUNTER — Inpatient Hospital Stay (HOSPITAL_COMMUNITY)
Admission: AD | Admit: 2015-08-18 | Discharge: 2015-08-20 | DRG: 767 | Disposition: A | Discharge status: Home / Self Care | Payer: BC Managed Care – PPO | Source: Ambulatory Visit | Attending: Obstetrics and Gynecology | Admitting: Obstetrics and Gynecology

## 2015-08-18 DIAGNOSIS — K219 Gastro-esophageal reflux disease without esophagitis: Secondary | ICD-10-CM | POA: Diagnosis present

## 2015-08-18 DIAGNOSIS — O09899 Supervision of other high risk pregnancies, unspecified trimester: Secondary | ICD-10-CM

## 2015-08-18 DIAGNOSIS — Z880 Allergy status to penicillin: Secondary | ICD-10-CM | POA: Diagnosis not present

## 2015-08-18 DIAGNOSIS — IMO0002 Reserved for concepts with insufficient information to code with codable children: Secondary | ICD-10-CM

## 2015-08-18 DIAGNOSIS — O99214 Obesity complicating childbirth: Secondary | ICD-10-CM | POA: Diagnosis present

## 2015-08-18 DIAGNOSIS — Z302 Encounter for sterilization: Secondary | ICD-10-CM | POA: Diagnosis not present

## 2015-08-18 DIAGNOSIS — O34593 Maternal care for other abnormalities of gravid uterus, third trimester: Secondary | ICD-10-CM | POA: Diagnosis present

## 2015-08-18 DIAGNOSIS — O99824 Streptococcus B carrier state complicating childbirth: Secondary | ICD-10-CM | POA: Diagnosis present

## 2015-08-18 DIAGNOSIS — Z6841 Body Mass Index (BMI) 40.0 and over, adult: Secondary | ICD-10-CM | POA: Diagnosis not present

## 2015-08-18 DIAGNOSIS — O9952 Diseases of the respiratory system complicating childbirth: Secondary | ICD-10-CM | POA: Diagnosis present

## 2015-08-18 DIAGNOSIS — Z91040 Latex allergy status: Secondary | ICD-10-CM | POA: Diagnosis not present

## 2015-08-18 DIAGNOSIS — O43123 Velamentous insertion of umbilical cord, third trimester: Secondary | ICD-10-CM | POA: Diagnosis present

## 2015-08-18 DIAGNOSIS — IMO0001 Reserved for inherently not codable concepts without codable children: Secondary | ICD-10-CM

## 2015-08-18 DIAGNOSIS — Z283 Underimmunization status: Secondary | ICD-10-CM

## 2015-08-18 DIAGNOSIS — N854 Malposition of uterus: Secondary | ICD-10-CM | POA: Diagnosis present

## 2015-08-18 DIAGNOSIS — Z9889 Other specified postprocedural states: Secondary | ICD-10-CM

## 2015-08-18 DIAGNOSIS — B951 Streptococcus, group B, as the cause of diseases classified elsewhere: Secondary | ICD-10-CM

## 2015-08-18 DIAGNOSIS — O9962 Diseases of the digestive system complicating childbirth: Secondary | ICD-10-CM | POA: Diagnosis present

## 2015-08-18 DIAGNOSIS — J45909 Unspecified asthma, uncomplicated: Secondary | ICD-10-CM | POA: Diagnosis present

## 2015-08-18 DIAGNOSIS — Z2839 Other underimmunization status: Secondary | ICD-10-CM

## 2015-08-18 DIAGNOSIS — Z3A39 39 weeks gestation of pregnancy: Secondary | ICD-10-CM

## 2015-08-18 DIAGNOSIS — I10 Essential (primary) hypertension: Secondary | ICD-10-CM

## 2015-08-18 HISTORY — DX: Supervision of other high risk pregnancies, unspecified trimester: O09.899

## 2015-08-18 HISTORY — DX: Other underimmunization status: Z28.39

## 2015-08-18 LAB — CBC
HEMATOCRIT: 34.7 % — AB (ref 36.0–46.0)
HEMOGLOBIN: 11.6 g/dL — AB (ref 12.0–15.0)
MCH: 26.8 pg (ref 26.0–34.0)
MCHC: 33.4 g/dL (ref 30.0–36.0)
MCV: 80.1 fL (ref 78.0–100.0)
Platelets: 183 10*3/uL (ref 150–400)
RBC: 4.33 MIL/uL (ref 3.87–5.11)
RDW: 13.9 % (ref 11.5–15.5)
WBC: 7 10*3/uL (ref 4.0–10.5)

## 2015-08-18 LAB — TYPE AND SCREEN
ABO/RH(D): B POS
Antibody Screen: NEGATIVE

## 2015-08-18 LAB — HIV ANTIBODY (ROUTINE TESTING W REFLEX): HIV Screen 4th Generation wRfx: NONREACTIVE

## 2015-08-18 MED ORDER — LACTATED RINGERS IV SOLN
INTRAVENOUS | Status: DC
  Administered 2015-08-18: 11:00:00 via INTRAVENOUS

## 2015-08-18 MED ORDER — PHENYLEPHRINE 40 MCG/ML (10ML) SYRINGE FOR IV PUSH (FOR BLOOD PRESSURE SUPPORT): 80.0000 ug | PREFILLED_SYRINGE | INTRAVENOUS | Status: DC | PRN

## 2015-08-18 MED ORDER — DIPHENHYDRAMINE HCL 50 MG/ML IJ SOLN: 12.5000 mg | INTRAMUSCULAR | Status: DC | PRN

## 2015-08-18 MED ORDER — LACTATED RINGERS IV SOLN: INTRAVENOUS | Status: DC

## 2015-08-18 MED ORDER — OXYTOCIN BOLUS FROM INFUSION: 500.0000 mL | INTRAVENOUS | Status: DC

## 2015-08-18 MED ORDER — LANOLIN HYDROUS EX OINT: TOPICAL_OINTMENT | CUTANEOUS | Status: DC | PRN

## 2015-08-18 MED ORDER — BENZOCAINE-MENTHOL 20-0.5 % EX AERO
1.0000 "application " | INHALATION_SPRAY | CUTANEOUS | Status: DC | PRN
  Administered 2015-08-18: 1 via TOPICAL
  Filled 2015-08-18 (×2): qty 56

## 2015-08-18 MED ORDER — PHENYLEPHRINE 40 MCG/ML (10ML) SYRINGE FOR IV PUSH (FOR BLOOD PRESSURE SUPPORT)
PREFILLED_SYRINGE | INTRAVENOUS | Status: AC
  Filled 2015-08-18: qty 20

## 2015-08-18 MED ORDER — ACETAMINOPHEN 325 MG PO TABS: 650.0000 mg | ORAL_TABLET | ORAL | Status: DC | PRN

## 2015-08-18 MED ORDER — OXYTOCIN 40 UNITS IN LACTATED RINGERS INFUSION - SIMPLE MED
INTRAVENOUS | Status: AC
  Filled 2015-08-18: qty 1000

## 2015-08-18 MED ORDER — DIBUCAINE 1 % RE OINT
1.0000 "application " | TOPICAL_OINTMENT | RECTAL | Status: DC | PRN
  Filled 2015-08-18: qty 28

## 2015-08-18 MED ORDER — ONDANSETRON HCL 4 MG/2ML IJ SOLN
4.0000 mg | Freq: Four times a day (QID) | INTRAMUSCULAR | Status: DC | PRN
  Administered 2015-08-18: 4 mg via INTRAVENOUS
  Filled 2015-08-18: qty 2

## 2015-08-18 MED ORDER — ZOLPIDEM TARTRATE 5 MG PO TABS: 5.0000 mg | ORAL_TABLET | Freq: Every evening | ORAL | Status: DC | PRN

## 2015-08-18 MED ORDER — FLEET ENEMA 7-19 GM/118ML RE ENEM: 1.0000 | ENEMA | RECTAL | Status: DC | PRN

## 2015-08-18 MED ORDER — LIDOCAINE HCL (PF) 1 % IJ SOLN
30.0000 mL | INTRAMUSCULAR | Status: DC | PRN
  Administered 2015-08-18: 30 mL via SUBCUTANEOUS
  Filled 2015-08-18: qty 30

## 2015-08-18 MED ORDER — ONDANSETRON HCL 4 MG/2ML IJ SOLN
4.0000 mg | INTRAMUSCULAR | Status: DC | PRN
  Administered 2015-08-19: 4 mg via INTRAVENOUS

## 2015-08-18 MED ORDER — SIMETHICONE 80 MG PO CHEW: 80.0000 mg | CHEWABLE_TABLET | ORAL | Status: DC | PRN

## 2015-08-18 MED ORDER — OXYTOCIN 40 UNITS IN LACTATED RINGERS INFUSION - SIMPLE MED
62.5000 mL/h | INTRAVENOUS | Status: DC
  Administered 2015-08-18: 999 mL/h via INTRAVENOUS
  Filled 2015-08-18: qty 1000

## 2015-08-18 MED ORDER — NALBUPHINE HCL 10 MG/ML IJ SOLN: 5.0000 mg | INTRAMUSCULAR | Status: DC | PRN

## 2015-08-18 MED ORDER — CITRIC ACID-SODIUM CITRATE 334-500 MG/5ML PO SOLN: 30.0000 mL | ORAL | Status: DC | PRN

## 2015-08-18 MED ORDER — DIPHENHYDRAMINE HCL 25 MG PO CAPS: 25.0000 mg | ORAL_CAPSULE | Freq: Four times a day (QID) | ORAL | Status: DC | PRN

## 2015-08-18 MED ORDER — FENTANYL 2.5 MCG/ML BUPIVACAINE 1/10 % EPIDURAL INFUSION (WH - ANES)
INTRAMUSCULAR | Status: AC
  Filled 2015-08-18: qty 125

## 2015-08-18 MED ORDER — PRENATAL MULTIVITAMIN CH
1.0000 | ORAL_TABLET | Freq: Every day | ORAL | Status: DC
Start: 2015-08-19 — End: 2015-08-20
  Administered 2015-08-19 – 2015-08-20 (×2): 1 via ORAL
  Filled 2015-08-18 (×2): qty 1

## 2015-08-18 MED ORDER — VANCOMYCIN HCL IN DEXTROSE 1-5 GM/200ML-% IV SOLN
1000.0000 mg | Freq: Two times a day (BID) | INTRAVENOUS | Status: DC
  Administered 2015-08-18: 1000 mg via INTRAVENOUS
  Filled 2015-08-18 (×2): qty 200

## 2015-08-18 MED ORDER — LIDOCAINE HCL (PF) 1 % IJ SOLN
INTRAMUSCULAR | Status: DC | PRN
  Administered 2015-08-18: 8 mL via EPIDURAL
  Administered 2015-08-18: 9 mL via EPIDURAL

## 2015-08-18 MED ORDER — WITCH HAZEL-GLYCERIN EX PADS
1.0000 | MEDICATED_PAD | CUTANEOUS | Status: DC | PRN
Start: 2015-08-18 — End: 2015-08-20

## 2015-08-18 MED ORDER — TETANUS-DIPHTH-ACELL PERTUSSIS 5-2.5-18.5 LF-MCG/0.5 IM SUSP
0.5000 mL | Freq: Once | INTRAMUSCULAR | Status: DC
Start: 2015-08-19 — End: 2015-08-20
  Filled 2015-08-18: qty 0.5

## 2015-08-18 MED ORDER — OXYTOCIN 40 UNITS IN LACTATED RINGERS INFUSION - SIMPLE MED
1.0000 m[IU]/min | INTRAVENOUS | Status: DC
  Administered 2015-08-18: 1 m[IU]/min via INTRAVENOUS

## 2015-08-18 MED ORDER — TERBUTALINE SULFATE 1 MG/ML IJ SOLN: 0.2500 mg | Freq: Once | INTRAMUSCULAR | Status: DC | PRN

## 2015-08-18 MED ORDER — LACTATED RINGERS IV SOLN: 500.0000 mL | INTRAVENOUS | Status: DC | PRN

## 2015-08-18 MED ORDER — OXYCODONE-ACETAMINOPHEN 5-325 MG PO TABS
1.0000 | ORAL_TABLET | ORAL | Status: DC | PRN
  Administered 2015-08-18 – 2015-08-19 (×2): 1 via ORAL
  Filled 2015-08-18 (×2): qty 1

## 2015-08-18 MED ORDER — OXYCODONE-ACETAMINOPHEN 5-325 MG PO TABS: 1.0000 | ORAL_TABLET | ORAL | Status: DC | PRN

## 2015-08-18 MED ORDER — SENNOSIDES-DOCUSATE SODIUM 8.6-50 MG PO TABS
2.0000 | ORAL_TABLET | ORAL | Status: DC
  Administered 2015-08-18 – 2015-08-19 (×2): 2 via ORAL
  Filled 2015-08-18 (×2): qty 2

## 2015-08-18 MED ORDER — ONDANSETRON HCL 4 MG PO TABS: 4.0000 mg | ORAL_TABLET | ORAL | Status: DC | PRN

## 2015-08-18 MED ORDER — IBUPROFEN 600 MG PO TABS
600.0000 mg | ORAL_TABLET | Freq: Four times a day (QID) | ORAL | Status: DC
  Administered 2015-08-18 – 2015-08-20 (×7): 600 mg via ORAL
  Filled 2015-08-18 (×7): qty 1

## 2015-08-18 MED ORDER — LORAZEPAM 2 MG/ML IJ SOLN
1.0000 mg | Freq: Once | INTRAMUSCULAR | Status: AC
  Administered 2015-08-18: 1 mg via INTRAVENOUS
  Filled 2015-08-18: qty 0.5

## 2015-08-18 MED ORDER — OXYCODONE-ACETAMINOPHEN 5-325 MG PO TABS
2.0000 | ORAL_TABLET | ORAL | Status: DC | PRN
  Administered 2015-08-19 – 2015-08-20 (×5): 2 via ORAL
  Filled 2015-08-18 (×5): qty 2

## 2015-08-18 MED ORDER — FENTANYL 2.5 MCG/ML BUPIVACAINE 1/10 % EPIDURAL INFUSION (WH - ANES)
14.0000 mL/h | INTRAMUSCULAR | Status: DC | PRN
  Administered 2015-08-18 (×2): 14 mL/h via EPIDURAL
  Filled 2015-08-18: qty 125

## 2015-08-18 MED ORDER — CLINDAMYCIN PHOSPHATE 900 MG/50ML IV SOLN: 900.0000 mg | Freq: Once | INTRAVENOUS | Status: DC

## 2015-08-18 MED ORDER — OXYCODONE-ACETAMINOPHEN 5-325 MG PO TABS: 2.0000 | ORAL_TABLET | ORAL | Status: DC | PRN

## 2015-08-18 MED ORDER — EPHEDRINE 5 MG/ML INJ: 10.0000 mg | INTRAVENOUS | Status: DC | PRN

## 2015-08-18 NOTE — MAU Note (Signed)
Pt presents to MAU with complaints of contractions since midnight. Denies any vaginal bleeding or LOF

## 2015-08-18 NOTE — H&P (Signed)
Shannon [name] is a 34 y.o. female, G3P1011 at 44w4dweeks, presenting to the MAU for labor. Called MAU at 0330 reporting contractions increasing in frequency and strength since midnight although she states she has "been contracting  for the past two days".  Per RN in MAU 4.5/80/-2   Patient Active Problem List   Diagnosis Date Noted  . Susceptible to varicella (non-immune), currently pregnant 08/18/2015  . Group beta Strep positive 08/18/2015  . History of myomectomy 08/18/2015  . Marginal insertion of umbilical cord 76/19/5093  . Latex allergy 08/18/2015  . Hypertensive disorder-resolved with weight loss 08/18/2015  . Normal labor 08/18/2015  . Active labor at term 08/18/2015  . Depression 01/19/2013  . Vitamin D deficiency 11/21/2012  . OVERWEIGHT 03/25/2009  . BACK PAIN WITH RADICULOPATHY 03/23/2009  . ASTHMA, UNSPECIFIED, UNSPECIFIED STATUS 10/03/2008    History of present pregnancy: Patient entered care at 8 weeks.   EDC of 08/21/15 was established by early [place] at [redacted]w[redacted]d.   Anatomy scan:  18 4/7 weeks, with normal findings and  anterior placenta. Breech. Marginal cord insertion. Cord insertion is 1.7-2.0 cm from uterine wall. Normal fluid. AP pocket= 3.8cm. Technicall limited study due to early GA and maternal habitus. Anatomy not seen: all heart views, profile, nb, hands, face, ACI Additional [place] evaluations:   [redacted]w[redacted]d  02/10/15  Nuchal Translucency [place]: NT U/S. NT= 1.30 mm CRL= 6.79 cm. FHT's 153 bpm. Anteverted Uterus.  Placental cord insertion appears velamentous. Amnion seen, ovaries/adnexas wnl. 15w    02/28/15 OB limited:  Anterior placenta- normal fluid. Positive FHT's. Singleton IUP. Cx closed Measured TV 3.58  cm.  [redacted]w[redacted]d  04/21/15   Anatomy follow up :   EFW 1 lb 4 0z= 63%tile. 154 BPM. Singleton, breech pres. Anterior placenta.  Nl fluid.= 4.6 cm. Cx closed. Adnexas unremarkable. 4 CH, LVOT, 3VV, AA, DA, profile, hands, 5th digit NB,  philtrum, face and abd cord insertion seen. RVOT  not well seen. Marginal placental cord insertion seen.= 1.7  cm from placental edge. [redacted]w[redacted]d  05/19/15  OB Follow up:  [name] breech. Anterior placenta. Marginal cord insertion seen.  Appears stable. Nornal  Fluid. AP pocket=5.0cm. EFW 65%. Cx closed. [redacted]w[redacted]d  06/23/15  OB follow up:  Vertex presentation. AFI 40th%. Marginal cord not visualized due to gestational age [redacted]W[redacted]d  07/21/15  Ob follow up:  Vertex. AFI 65% . Anterior placenta    Significant prenatal events:  Frequent episodes of severe pelvic pain, vaginal discomfort and back pain throughout pregnancy.Cmplaints of sciatica and pinched nerve on right side. Experiences Migraines with sensitivity to light and sensation of dizzy spells Last evaluation:  08/11/15  OB History    Gravida Para Term Preterm AB TAB SAB Ectopic Multiple Living   3 1 1  1  1   1      Past Medical History  Diagnosis Date  . Asthma   . Irregular heart beat    Past Surgical History  Procedure Laterality Date  . Tonsils and adnoids removed    . Dilatation & currettage/hysteroscopy with resectocope N/A 02/03/2014    Procedure: DILATATION & CURETTAGE/HYSTEROSCOPY ;  Surgeon: Delice Lesch, MD;  Location: [facility];  Service: Gynecology;  Laterality: N/A;  . Myomectomy N/A 02/03/2014    Procedure: MYOMECTOMY ;  Surgeon: Delice Lesch, MD;  Location: [facility];  Service: Gynecology;  Laterality: N/A;   Family History: family history includes Asthma in her brother; Cancer in her father and paternal grandmother; Diabetes in her mother; Heart  disease in her maternal grandmother; Hypertension in her father, maternal grandmother, and mother; Mental illness in her mother. Social History:  reports that she has never smoked. She has never used smokeless tobacco. She reports that she does not drink alcohol or use illicit drugs.  African American  Prenatal Transfer Tool  Maternal Diabetes: No Genetic Screening: Normal Maternal Ultrasounds/Referrals: Normal Fetal Ultrasounds or  other Referrals:  None Maternal Substance Abuse:  No Significant Maternal Medications:  None Significant Maternal Lab Results: Lab values include: Group B Strep positive  TDAP  No records Flu declined  ROS:  See above  Allergies  Allergen Reactions  . Pineapple Shortness Of Breath  . Ceftriaxone Sodium Hives  . Diflucan [Fluconazole] Hives    Raw rash on abdomen and she had sores and swelling in mouth   . Eggs Or Egg-Derived Products Nausea And Vomiting    Can eat cake made with eggs  . Latex Rash     Dilation: 6.5 Effacement (%): 80 Station: -1 Exam by:: S Earl RNC Blood pressure 125/111, pulse 227, temperature 98.6 F (37 C), temperature source Oral, resp. rate 16, height 5\' 4"  (1.626 m), weight 112.946 kg (249 lb), last menstrual period 11/14/2014, SpO2 80 %.  Chest clear Heart RRR without murmur Abd gravid, NT, FH 39 cm Pelvic: proven to 6lb12oz Ext: wnl   FHR: Category 1 UCs:  Regular 94min  Prenatal labs: ABO, Rh: --/--/B POS (10/27 0850) Antibody: NEG (10/27 0850) Rubella:  !Error!  Immune RPR:   Non - reactive HBsAg:    negative HIV: Non Reactive (10/27 0850)  GBS: Positive (08/11 0000) Sickle cell/Hgb electrophoresis:  unknown Pap:  01/01/14 GC:  Negative Chlamydia:  negative Genetic screenings:   AFP maternal screen neg Glucola:  normal Other:  VARICELLA: NON IMMUNE Hgb 12.6 at NOB, 11.5 at 28 weeks    Assessment/Plan: IUP at [redacted]w[redacted]d Early labor GBS positive with unknown sensitivities Rocefin/PCN Allergy  Marginal insertion of umbilical cord Varicella non-immune Latex allergy   Plan: Admit to [place] per consult with Dr. Cletis Media Routine [facility] orders Pain med/epidural prn Vancomycin for GBS prophylaxis   Cherre Huger, [place] 08/18/2015, 4:41 PM

## 2015-08-18 NOTE — Progress Notes (Signed)
Subjective: Very uncomfortable and upset.   Crying and complaining of lower back "spasms" .     Objective: BP 112/67 mmHg  Pulse 91  Temp(Src) 98.3 F (36.8 C) (Oral)  Resp 16  Ht 5\' 4"  (1.626 m)  Wt 112.946 kg (249 lb)  BMI 42.72 kg/m2  SpO2 80%  LMP 11/14/2014      FHT: Category 1 ,  + Accels,  UC:   regular, every 2-4 minutes SVE:   Dilation: 5 Effacement (%): 80 Station: -1, -2 Exam by:: Shannon [name], CNM IUPC placed with ease   Assessment:  SROM x4.5 hrs Early labor with minimal progress    Plan: Anethesia called to room to re-evaluate epidural Encourage position changes Evaluate MVus s/p IUPC placement Begin Pitocin if MVus inadequate Dr. Cletis Media aware and agrees with plan  Shannon [name] CNM, [place] 08/18/2015, 2:26 PM

## 2015-08-18 NOTE — Anesthesia Preprocedure Evaluation (Signed)
Anesthesia Evaluation  Patient identified by MRN, date of birth, ID band Patient awake    Reviewed: Allergy & Precautions, H&P , NPO status , Patient's Chart, lab work & pertinent test results  Airway Mallampati: II  TM Distance: >3 FB Neck ROM: full    Dental no notable dental hx.    Pulmonary neg pulmonary ROS,    Pulmonary exam normal        Cardiovascular Normal cardiovascular exam     Neuro/Psych negative neurological ROS     GI/Hepatic negative GI ROS, Neg liver ROS,   Endo/Other  Morbid obesity  Renal/GU negative Renal ROS     Musculoskeletal   Abdominal (+) + obese,   Peds  Hematology negative hematology ROS (+)   Anesthesia Other Findings   Reproductive/Obstetrics (+) Pregnancy                             Anesthesia Physical Anesthesia Plan  ASA: III  Anesthesia Plan: Epidural   Post-op Pain Management:    Induction:   Airway Management Planned:   Additional Equipment:   Intra-op Plan:   Post-operative Plan:   Informed Consent: I have reviewed the patients History and Physical, chart, labs and discussed the procedure including the risks, benefits and alternatives for the proposed anesthesia with the patient or authorized representative who has indicated his/her understanding and acceptance.     Plan Discussed with:   Anesthesia Plan Comments:         Anesthesia Quick Evaluation

## 2015-08-18 NOTE — Lactation Note (Signed)
This note was copied from the chart of [name]. Lactation Consultation Note  Patient Name: Shannon [name] Today's Date: 08/18/2015 Reason for consult: Initial assessment   Initial consult at 2 hours old; GA 39.4; BW 7 lbs, 3.2 oz.  GBS+; Mom is a P2 but oldest child is 34 yrs old; mom reports breastfeeding oldest child without difficulty or use of nipple shield.   Infant has breastfed x1 (15 min) since birth; voids-0; stools-0.  LS-9 by RN.   RN called LC to see latch since mom has semi-flat nipples that significantly dimples (right more flat than left). Hand expression taught with return demonstration but no observation of colostrum. Spoons, curved-tip syringe, and colostrum collection container given for EBM feedings/supplementions as needed. Infant is eager to be on breast and will easily re-latch when he comes off or is taken of by mom.  Infant cries to go back on breast if he comes off breast. Infant can latch to nipples but slight dimpling noted with infant's latch.  Mom is able to latch with wide mouth and flanged lips.  Even with chin tug and increasing depth, dimples continued to persist. LC applied #20 Nipple shield to see if it would decrease dimpling, but mom nor baby liked nipple shield; baby would not latch with shield. Encouraged mom to continue latching infant with feeding cues and encouraged her to work on HE feeding any EBM back to infant since he is so eager to feed. Educated on size of infant's stomach, cluster feeding, and feeding cues. Lactation brochure given and informed of hospital support group and outpatient services. Report given to RN of consult.     Maternal Data Does the patient have breastfeeding experience prior to this delivery?: No  Feeding Feeding Type: Breast Fed Length of feed: 15 min  LATCH Score/Interventions Latch: Grasps breast easily, tongue down, lips flanged, rhythmical sucking. Intervention(s): Assist with latch  Audible  Swallowing: A few with stimulation  Type of Nipple: Flat (right more erect than left.  Both with significant dimples)  Comfort (Breast/Nipple): Soft / non-tender     Hold (Positioning): Assistance needed to correctly position infant at breast and maintain latch. Intervention(s): Breastfeeding basics reviewed;Support Pillows;Skin to skin  LATCH Score: 7  Lactation Tools Discussed/Used WIC Program: No   Consult Status Consult Status: Follow-up Date: 08/19/15 Follow-up type: In-patient    Merlene Laughter 08/18/2015, 10:00 PM

## 2015-08-18 NOTE — Anesthesia Procedure Notes (Signed)
Epidural Patient location during procedure: OB Start time: 08/18/2015 9:40 AM End time: 08/18/2015 9:44 AM  Staffing Anesthesiologist: Lyn Hollingshead Performed by: anesthesiologist   Preanesthetic Checklist Completed: patient identified, surgical consent, pre-op evaluation, timeout performed, IV checked, risks and benefits discussed and monitors and equipment checked  Epidural Patient position: sitting Prep: site prepped and draped and DuraPrep Patient monitoring: continuous pulse ox and blood pressure Approach: midline Location: L3-L4 Injection technique: LOR air  Needle:  Needle type: Tuohy  Needle gauge: 17 G Needle length: 9 cm and 9 Needle insertion depth: 7 cm Catheter type: closed end flexible Catheter size: 19 Gauge Catheter at skin depth: 13 cm Test dose: negative and Other  Assessment Sensory level: T9 Events: blood not aspirated, injection not painful, no injection resistance, negative IV test and no paresthesia  Additional Notes Reason for block:procedure for pain

## 2015-08-18 NOTE — Progress Notes (Signed)
Subjective: Comfortable with epidural.   Objective: BP 128/85 mmHg  Pulse 96  Temp(Src) 97.9 F (36.6 C) (Oral)  Resp 18  Ht 5\' 4"  (1.626 m)  Wt 112.946 kg (249 lb)  BMI 42.72 kg/m2  SpO2 95%  LMP 11/14/2014      FHT: Category 1, 130 bpm,  + Accel, no decels UC:   regular, every 4-6 minutes SVE:   Dilation: 4.5 Effacement (%): 70 Station: -1, -2 Exam by:: Carter Kitten, RN   Assessment:  IUP at [redacted]w[redacted]d Early labor GBS positive without sensitivities Marginal insertion of umbilical cord Latex and Rocefin allergy    Plan: Continue current plan Antibiotics in labor - Vancomycin for Rocefin allergy and unknown sensitivities  Consulted with Dr. Noralee Chars CNM, [place] 08/18/2015, 10:22 AM

## 2015-08-18 NOTE — Progress Notes (Signed)
Subjective: Comfortable with epidural  Objective: BP 115/72 mmHg  Pulse 92  Temp(Src) 98.3 F (36.8 C) (Oral)  Resp 16  Ht 5\' 4"  (1.626 m)  Wt 112.946 kg (249 lb)  BMI 42.72 kg/m2  SpO2 80%  LMP 11/14/2014      Results for orders placed or performed during the hospital encounter of 08/18/15 (from the past 48 hour(s))  CBC     Status: Abnormal   Collection Time: 08/18/15  8:50 AM  Result Value Ref Range   WBC 7.0 4.0 - 10.5 K/uL   RBC 4.33 3.87 - 5.11 MIL/uL   Hemoglobin 11.6 (L) 12.0 - 15.0 g/dL   HCT 34.7 (L) 36.0 - 46.0 %   MCV 80.1 78.0 - 100.0 fL   MCH 26.8 26.0 - 34.0 pg   MCHC 33.4 30.0 - 36.0 g/dL   RDW 13.9 11.5 - 15.5 %   Platelets 183 150 - 400 K/uL  Type and screen     Status: None   Collection Time: 08/18/15  8:50 AM  Result Value Ref Range   ABO/RH(D) B POS    Antibody Screen NEG    Sample Expiration 08/21/2015     FHT: Category 1 , 130 bpm, +accels, no decels UC:   irregular, every 1-5 minutes SVE:   Dilation: 5 Effacement (%): 80 Station: -1, -2 Exam by:: Lindalou Soltis, CNM   Assessment:  IUP at  39d4w Early labor SROM x2 hrs Inadequate contraction pattern GBS positive - Vancomycin 1000mg   x1 Marginal insertion of umbilical cord Varicella non-immune Latex allergy  Plan: Augmentation of labor  Discussed IUPC placement  Discussed risk and benefits of pitocin - pt understands and is agreeable to plan Consult with Dr. Noralee Chars CNM, [place] 08/18/2015, 12:47 PM

## 2015-08-19 ENCOUNTER — Inpatient Hospital Stay (HOSPITAL_COMMUNITY): Payer: BC Managed Care – PPO | Admitting: Anesthesiology

## 2015-08-19 ENCOUNTER — Encounter (HOSPITAL_COMMUNITY): Payer: Self-pay | Admitting: Anesthesiology

## 2015-08-19 ENCOUNTER — Encounter (HOSPITAL_COMMUNITY)
Admission: AD | Disposition: A | Discharge status: Home / Self Care | Payer: Self-pay | Source: Ambulatory Visit | Attending: Obstetrics and Gynecology

## 2015-08-19 HISTORY — PX: TUBAL LIGATION: SHX77

## 2015-08-19 LAB — CCBB MATERNAL DONOR DRAW

## 2015-08-19 LAB — SURGICAL PCR SCREEN
MRSA, PCR: NEGATIVE
STAPHYLOCOCCUS AUREUS: NEGATIVE

## 2015-08-19 LAB — RPR: RPR Ser Ql: NONREACTIVE

## 2015-08-19 LAB — CBC
HEMATOCRIT: 29.2 % — AB (ref 36.0–46.0)
Hemoglobin: 9.9 g/dL — ABNORMAL LOW (ref 12.0–15.0)
MCH: 27.3 pg (ref 26.0–34.0)
MCHC: 33.9 g/dL (ref 30.0–36.0)
MCV: 80.4 fL (ref 78.0–100.0)
Platelets: 157 10*3/uL (ref 150–400)
RBC: 3.63 MIL/uL — AB (ref 3.87–5.11)
RDW: 13.9 % (ref 11.5–15.5)
WBC: 11.6 10*3/uL — AB (ref 4.0–10.5)

## 2015-08-19 SURGERY — LIGATION, FALLOPIAN TUBE, POSTPARTUM
Anesthesia: Epidural | Site: Abdomen

## 2015-08-19 MED ORDER — METOCLOPRAMIDE HCL 10 MG PO TABS: 10.0000 mg | ORAL_TABLET | Freq: Once | ORAL | Status: DC

## 2015-08-19 MED ORDER — MIDAZOLAM HCL 2 MG/2ML IJ SOLN
INTRAMUSCULAR | Status: AC
  Filled 2015-08-19: qty 4

## 2015-08-19 MED ORDER — NALBUPHINE HCL 10 MG/ML IJ SOLN
INTRAMUSCULAR | Status: AC
  Administered 2015-08-19: 5 mg
  Filled 2015-08-19: qty 1

## 2015-08-19 MED ORDER — CHLOROPROCAINE HCL (PF) 3 % IJ SOLN
INTRAMUSCULAR | Status: AC
  Filled 2015-08-19: qty 20

## 2015-08-19 MED ORDER — FENTANYL CITRATE (PF) 100 MCG/2ML IJ SOLN
INTRAMUSCULAR | Status: AC
  Filled 2015-08-19: qty 4

## 2015-08-19 MED ORDER — KETOROLAC TROMETHAMINE 30 MG/ML IJ SOLN
INTRAMUSCULAR | Status: DC | PRN
  Administered 2015-08-19: 30 mg via INTRAVENOUS

## 2015-08-19 MED ORDER — MIDAZOLAM HCL 5 MG/5ML IJ SOLN
INTRAMUSCULAR | Status: DC | PRN
  Administered 2015-08-19 (×4): 1 mg via INTRAVENOUS

## 2015-08-19 MED ORDER — FENTANYL CITRATE (PF) 100 MCG/2ML IJ SOLN
25.0000 ug | INTRAMUSCULAR | Status: DC | PRN
  Administered 2015-08-19 (×2): 50 ug via INTRAVENOUS

## 2015-08-19 MED ORDER — SODIUM BICARBONATE 8.4 % IV SOLN
INTRAVENOUS | Status: DC | PRN
  Administered 2015-08-19 (×2): 3 mL via EPIDURAL
  Administered 2015-08-19: 5 mL via EPIDURAL
  Administered 2015-08-19: 2 mL via EPIDURAL
  Administered 2015-08-19 (×2): 5 mL via EPIDURAL
  Administered 2015-08-19: 2 mL via EPIDURAL

## 2015-08-19 MED ORDER — BUPIVACAINE HCL (PF) 0.25 % IJ SOLN
INTRAMUSCULAR | Status: AC
  Filled 2015-08-19: qty 30

## 2015-08-19 MED ORDER — FAMOTIDINE 20 MG PO TABS: 40.0000 mg | ORAL_TABLET | Freq: Once | ORAL | Status: DC

## 2015-08-19 MED ORDER — SODIUM BICARBONATE 8.4 % IV SOLN
INTRAVENOUS | Status: AC
  Filled 2015-08-19: qty 50

## 2015-08-19 MED ORDER — METOCLOPRAMIDE HCL 10 MG PO TABS
10.0000 mg | ORAL_TABLET | Freq: Once | ORAL | Status: AC
  Administered 2015-08-19: 10 mg via ORAL
  Filled 2015-08-19: qty 1

## 2015-08-19 MED ORDER — FENTANYL CITRATE (PF) 100 MCG/2ML IJ SOLN
INTRAMUSCULAR | Status: DC | PRN
  Administered 2015-08-19: 100 ug via EPIDURAL
  Administered 2015-08-19 (×4): 50 ug via INTRAVENOUS

## 2015-08-19 MED ORDER — FENTANYL CITRATE (PF) 100 MCG/2ML IJ SOLN
INTRAMUSCULAR | Status: AC
  Administered 2015-08-19: 50 ug via INTRAVENOUS
  Filled 2015-08-19: qty 2

## 2015-08-19 MED ORDER — LIDOCAINE-EPINEPHRINE (PF) 2 %-1:200000 IJ SOLN
INTRAMUSCULAR | Status: AC
  Filled 2015-08-19: qty 20

## 2015-08-19 MED ORDER — CHLOROPROCAINE HCL (PF) 3 % IJ SOLN
INTRAMUSCULAR | Status: DC | PRN
  Administered 2015-08-19 (×2): 10 mL

## 2015-08-19 MED ORDER — METOCLOPRAMIDE HCL 5 MG/ML IJ SOLN: 10.0000 mg | Freq: Once | INTRAMUSCULAR | Status: DC | PRN

## 2015-08-19 MED ORDER — MEPERIDINE HCL 25 MG/ML IJ SOLN: 6.2500 mg | INTRAMUSCULAR | Status: DC | PRN

## 2015-08-19 MED ORDER — ONDANSETRON HCL 4 MG/2ML IJ SOLN
INTRAMUSCULAR | Status: AC
  Filled 2015-08-19: qty 2

## 2015-08-19 MED ORDER — LACTATED RINGERS IV SOLN
INTRAVENOUS | Status: DC | PRN
  Administered 2015-08-19 (×2): via INTRAVENOUS

## 2015-08-19 MED ORDER — LACTATED RINGERS IV SOLN
INTRAVENOUS | Status: DC
  Administered 2015-08-19: 12:00:00 via INTRAVENOUS

## 2015-08-19 MED ORDER — FAMOTIDINE 20 MG PO TABS
40.0000 mg | ORAL_TABLET | Freq: Once | ORAL | Status: AC
  Administered 2015-08-19: 40 mg via ORAL
  Filled 2015-08-19: qty 2

## 2015-08-19 MED ORDER — BUPIVACAINE HCL (PF) 0.25 % IJ SOLN
INTRAMUSCULAR | Status: DC | PRN
  Administered 2015-08-19: 11 mL

## 2015-08-19 SURGICAL SUPPLY — 30 items
APL SKNCLS STERI-STRIP NONHPOA (GAUZE/BANDAGES/DRESSINGS)
BENZOIN TINCTURE PRP APPL 2/3 (GAUZE/BANDAGES/DRESSINGS) IMPLANT
CHLORAPREP W/TINT 26ML (MISCELLANEOUS) ×3 IMPLANT
CLOSURE WOUND 1/4 X3 (GAUZE/BANDAGES/DRESSINGS)
CLOTH BEACON ORANGE TIMEOUT ST (SAFETY) ×3 IMPLANT
CONTAINER PREFILL 10% NBF 15ML (MISCELLANEOUS) ×6 IMPLANT
DRSG OPSITE POSTOP 3X4 (GAUZE/BANDAGES/DRESSINGS) ×3 IMPLANT
ELECT REM PT RETURN 9FT ADLT (ELECTROSURGICAL) ×3
ELECTRODE REM PT RTRN 9FT ADLT (ELECTROSURGICAL) ×1 IMPLANT
GLOVE BIO SURGEON STRL SZ7.5 (GLOVE) ×3 IMPLANT
GLOVE BIOGEL PI IND STRL 7.5 (GLOVE) ×1 IMPLANT
GLOVE BIOGEL PI INDICATOR 7.5 (GLOVE) ×2
GOWN STRL REUS W/TWL LRG LVL3 (GOWN DISPOSABLE) ×6 IMPLANT
HEMOSTAT SURGICEL 2X3 (HEMOSTASIS) ×2 IMPLANT
LIQUID BAND (GAUZE/BANDAGES/DRESSINGS) IMPLANT
NEEDLE HYPO 22GX1.5 SAFETY (NEEDLE) IMPLANT
NS IRRIG 1000ML POUR BTL (IV SOLUTION) ×3 IMPLANT
PACK ABDOMINAL MINOR (CUSTOM PROCEDURE TRAY) ×3 IMPLANT
PENCIL BUTTON HOLSTER BLD 10FT (ELECTRODE) ×3 IMPLANT
SPONGE LAP 4X18 X RAY DECT (DISPOSABLE) IMPLANT
STRIP CLOSURE SKIN 1/4X3 (GAUZE/BANDAGES/DRESSINGS) IMPLANT
SUT MNCRL AB 3-0 PS2 27 (SUTURE) ×3 IMPLANT
SUT PLAIN 0 NONE (SUTURE) ×3 IMPLANT
SUT VIC AB 0 CT1 36 (SUTURE) ×3 IMPLANT
SUT VIC AB 2-0 SH 27 (SUTURE)
SUT VIC AB 2-0 SH 27XBRD (SUTURE) IMPLANT
SYR CONTROL 10ML LL (SYRINGE) ×2 IMPLANT
TOWEL OR 17X24 6PK STRL BLUE (TOWEL DISPOSABLE) ×6 IMPLANT
TRAY FOLEY CATH SILVER 14FR (SET/KITS/TRAYS/PACK) ×3 IMPLANT
WATER STERILE IRR 1000ML POUR (IV SOLUTION) ×3 IMPLANT

## 2015-08-19 NOTE — Transfer of Care (Signed)
Immediate Anesthesia Transfer of Care Note  Patient: Shannon [name]  Procedure(s) Performed: Procedure(s): POST PARTUM TUBAL LIGATION (N/A)  Patient Location: PACU  Anesthesia Type:Epidural  Level of Consciousness: awake, alert , oriented and patient cooperative  Airway & Oxygen Therapy: Patient Spontanous Breathing  Post-op Assessment: Report given to RN and Post -op Vital signs reviewed and stable  Post vital signs: Reviewed and stable  Last Vitals:  Filed Vitals:   08/19/15 1153  BP: 121/78  Pulse: 91  Temp: 36.8 C  Resp: 17    Complications: No apparent anesthesia complications

## 2015-08-19 NOTE — Addendum Note (Signed)
Addendum  created 08/19/15 1519 by Flossie Dibble, CRNA   Modules edited: Charges VN

## 2015-08-19 NOTE — Op Note (Signed)
Preop Diagnosis: Desires Sterilization   Postop Diagnosis: Desires sterilization   Procedure: POST PARTUM BILATERAL SALPINGECTOMY   Anesthesia: Epidural   Anesthesiologist:  Josephine Igo, MD  Attending: Everett Graff, MD  Assistant: N/a  Findings: Nl appearing bilateral fallopian tubes  Pathology: Bilateral Fallopian Tubes  Fluids: 1600 cc  UOP: 200 cc  EBL: 10 cc  Complications: None  Procedure:The patient was taken to the operating room after the risks, benefits, alternatives, complications, treatment options, and expected outcomes were discussed with the patient including but not limited to bleeding, infection, injury, risk of failure and risk of regret. Questions answered and consent signed and witnessed.  The patient verbalized understanding, the patient concurred with the proposed plan and consent signed and witnessed. The patient was taken to the Operating Room, identified as Shannon [name] and the procedure verified as bilateral sterilization. A Time Out was held and the above information confirmed.  The patient was given a surgical level via the epidural and prepped, draped, and catheterized in the normal, sterile fashion in the supine position.  A 45mm incision was made at the umbilicus and carried down to the underlying layer of fascia and peritoneum entered without difficulty.  The left fallopian tube was indentified and carried out to its fimbriated end and Kelly clamps used to clamp tube after elevating with babcock.  In a sequential fashion the tube was cut and suture ligated with 2-0 vicryl doubly.  A tie was used to ligate the pedicle as well and bovie used to cauterize the tips of the remaining pedicle.  The tube was sent to pathology.  The same was done on the contralateral side.  The left pedicle was visualized again and the left ovary was a little irritated right near the pedicle.  Surgicel was placed on the ovary edge and good hemostasis noted.  The  fascia was then repaired with 0 vicryl via a running stitch and the incision was closed with 3-0 vicryl via subcuticular sutures and dermabond applied.  Instrument, sponge, and needle counts were correct, patient tolerated the procedure well and was returned to the recovery room in good condition.

## 2015-08-19 NOTE — Progress Notes (Signed)
Subjective: Postpartum Day 1: Vaginal delivery, Vulvar laceration Patient up ad lib, reports no syncope or dizziness. Feeding:  Breast Contraceptive plan:  Postpartum Bilateral tubal ligation   Objective: Vital signs in last 24 hours: Temp:  [97.7 F (36.5 C)-99.5 F (37.5 C)] 98.6 F (37 C) (10/28 0513) Pulse Rate:  [80-143] 90 (10/28 0513) Resp:  [16-20] 17 (10/28 0513) BP: (81-156)/(47-111) 118/67 mmHg (10/28 0513)  Physical Exam:  General: alert and cooperative Lochia: appropriate Uterine Fundus: firm Perineum: healing well DVT Evaluation: No evidence of DVT seen on physical exam.   CBC Latest Ref Rng 08/19/2015 08/18/2015 02/04/2014  WBC 4.0 - 10.5 K/uL 11.6(H) 7.0 10.7(H)  Hemoglobin 12.0 - 15.0 g/dL 9.9(L) 11.6(L) 11.3(L)  Hematocrit 36.0 - 46.0 % 29.2(L) 34.7(L) 34.2(L)  Platelets 150 - 400 K/uL 157 183 204     Assessment/Plan: Status post vaginal delivery day 1. Stable Normal involution Continue current care. Contraception Bitlateral Tubal ligation today     Cherre Huger 08/19/2015, 10:39 AM

## 2015-08-19 NOTE — Lactation Note (Signed)
This note was copied from the chart of [place]. Lactation Consultation Note  Patient Name: Shannon [name] [id number] Date: 08/19/2015 Reason for consult: Follow-up assessment BF mom that is worried she does not have enough milk. Went over baby behavior, feeding frequency, baby belly size, breast changes, nipple care, pacifier, O/P lactation services, and support group. Mom will continue to use hand pump to pull nipples out then latch baby. She will call for bf help as needed.   Maternal Data Has patient been taught Hand Expression?: Yes (no colostrum noted at last attempt )  Feeding Feeding Type: Breast Fed Length of feed: 7 min (RN held baby while mom recovering)  LATCH Score/Interventions Latch: Repeated attempts needed to sustain latch, nipple held in mouth throughout feeding, stimulation needed to elicit sucking reflex. Intervention(s): Assist with latch  Audible Swallowing: A few with stimulation Intervention(s): Skin to skin (hand pump)  Type of Nipple: Inverted Intervention(s): Hand pump Intervention(s): Hand pump  Comfort (Breast/Nipple): Soft / non-tender     Hold (Positioning): Assistance needed to correctly position infant at breast and maintain latch.  LATCH Score: 5  Lactation Tools Discussed/Used     Consult Status Consult Status: Follow-up Date: 08/20/15 Follow-up type: In-patient    Denzil Hughes 08/19/2015, 5:05 PM

## 2015-08-19 NOTE — Anesthesia Postprocedure Evaluation (Signed)
  Anesthesia Post-op Note  Patient: Shannon [name]  Procedure(s) Performed: * No procedures listed *  Patient Location: Mother/Baby  Anesthesia Type:Epidural  Level of Consciousness: awake, alert  and oriented  Airway and Oxygen Therapy: Patient Spontanous Breathing  Post-op Pain: none  Post-op Assessment: Post-op Vital signs reviewed, Patient's Cardiovascular Status Stable, Respiratory Function Stable, Patent Airway, No signs of Nausea or vomiting, Adequate PO intake, Pain level controlled, No headache, No backache and Patient able to bend at knees              Post-op Vital Signs: Reviewed and stable  Last Vitals:  Filed Vitals:   08/19/15 0513  BP: 118/67  Pulse: 90  Temp: 37 C  Resp: 17    Complications: No apparent anesthesia complications

## 2015-08-19 NOTE — Anesthesia Postprocedure Evaluation (Signed)
  Anesthesia Post-op Note  Patient: Shannon [name]  Procedure(s) Performed: Procedure(s): POST PARTUM TUBAL LIGATION (N/A)  Patient Location: PACU  Anesthesia Type:Epidural  Level of Consciousness: awake, alert  and oriented  Airway and Oxygen Therapy: Patient Spontanous Breathing  Post-op Pain: none  Post-op Assessment: Post-op Vital signs reviewed, Patient's Cardiovascular Status Stable, Respiratory Function Stable, Patent Airway, No signs of Nausea or vomiting, Pain level controlled, No headache, No backache and Patient able to bend at knees              Post-op Vital Signs: Reviewed and stable  Last Vitals:  Filed Vitals:   08/19/15 1345  BP: 118/79  Pulse: 89  Temp: 36.6 C  Resp: 11    Complications: No apparent anesthesia complications

## 2015-08-19 NOTE — Addendum Note (Signed)
Addendum  created 08/19/15 1822 by Flossie Dibble, CRNA   Modules edited: Anesthesia Responsible Staff

## 2015-08-19 NOTE — Anesthesia Preprocedure Evaluation (Signed)
Anesthesia Evaluation  Patient identified by MRN, date of birth, ID band Patient awake    Reviewed: Allergy & Precautions, H&P , NPO status , Patient's Chart, lab work & pertinent test results  Airway Mallampati: II  TM Distance: >3 FB Neck ROM: full    Dental no notable dental hx. (+) Teeth Intact   Pulmonary neg pulmonary ROS, asthma ,    Pulmonary exam normal breath sounds clear to auscultation       Cardiovascular hypertension, Normal cardiovascular exam Rhythm:Regular Rate:Normal     Neuro/Psych PSYCHIATRIC DISORDERS Depression negative neurological ROS     GI/Hepatic Neg liver ROS, GERD  Medicated and Controlled,  Endo/Other  Morbid obesity  Renal/GU negative Renal ROS  negative genitourinary   Musculoskeletal negative musculoskeletal ROS (+)   Abdominal (+) + obese,   Peds  Hematology  (+) anemia ,   Anesthesia Other Findings   Reproductive/Obstetrics Desires sterilization- Post partum                             Anesthesia Physical  Anesthesia Plan  ASA: III  Anesthesia Plan: Epidural   Post-op Pain Management:    Induction:   Airway Management Planned: Natural Airway  Additional Equipment:   Intra-op Plan:   Post-operative Plan:   Informed Consent: I have reviewed the patients History and Physical, chart, labs and discussed the procedure including the risks, benefits and alternatives for the proposed anesthesia with the patient or authorized representative who has indicated his/her understanding and acceptance.     Plan Discussed with: CRNA, Anesthesiologist and Surgeon  Anesthesia Plan Comments: (Will plan on using epidural for PPTL. )        Anesthesia Quick Evaluation

## 2015-08-19 NOTE — Anesthesia Postprocedure Evaluation (Signed)
  Anesthesia Post-op Note  Patient: Shannon [name]  Procedure(s) Performed: Lumbar Epidural for L & D  Patient Location: Mother/Baby  Anesthesia Type:Epidural  Level of Consciousness: awake, alert  and oriented  Airway and Oxygen Therapy: Patient Spontanous Breathing  Post-op Pain: none  Post-op Assessment: Post-op Vital signs reviewed, Patient's Cardiovascular Status Stable, Respiratory Function Stable, Patent Airway, No signs of Nausea or vomiting, Pain level controlled, No headache, No backache and Patient able to bend at knees              Post-op Vital Signs: Reviewed and stable  Last Vitals:  Filed Vitals:   08/19/15 0513  BP: 118/67  Pulse: 90  Temp: 37 C  Resp: 17    Complications: No apparent anesthesia complications

## 2015-08-19 NOTE — Progress Notes (Signed)
MOB was referred for history of depression/anxiety. * Referral screened out by Clinical Social Worker because none of the following criteria appear to apply: ~ History of anxiety/depression during this pregnancy, or of post-partum depression. ~ Diagnosis of anxiety and/or depression within last 3 years OR * MOB's symptoms currently being treated with medication and/or therapy. Please contact the Clinical Social Worker if needs arise, or if MOB requests.  MOB's PNR states, "h/o depression about 6 years ago around the time of her divorce."

## 2015-08-20 MED ORDER — IBUPROFEN 600 MG PO TABS: 600.0000 mg | ORAL_TABLET | Freq: Four times a day (QID) | ORAL | Status: DC

## 2015-08-20 MED ORDER — OXYCODONE-ACETAMINOPHEN 5-325 MG PO TABS: 1.0000 | ORAL_TABLET | ORAL | Status: DC | PRN

## 2015-08-20 NOTE — Lactation Note (Signed)
This note was copied from the chart of [place]. Lactation Consultation Note Mom c/o nipple pain and worrying about baby not getting enough from breast. Nipples are inverted, areolas thick, not compressible for deep latching. Massaged breast and hand expressed, saw dot of thick colostrum to end of nipple to Rt. Breast, Lt. Breast had to massage and hand express for over 10 min. To see glistening of colostrum  At end of nipple. Nipple edges pull out but center of inverted nipple stays in, nipple has shape of volcanoes. Gave shells to wear to assist nipple to evert to wear between BF in bra. Gave comfort gels for sore nipples. Encouraged to pat colostrum to nipples for soreness. Discussed engorgement. Stressed I&O.  RN doesn't feel like baby has had any good feedings. Saw a little colostrum inside of NS after brief BF after circumcision. Discussed supplementing w/formula after BF. Can use syring and insert formula into NS or finger feed until mom has more colostrum flow. Mom has #20 NS which is appropriate size. Using hand pump as well for stimulation of breast and evert nipple prior to latching. Mom states hurts to pump or BF. Explained some inverted nipples hurt d/t adhesions.  Encouraged mom to make f/u appt. W/LC out pt. D/t wearing NS and inverted nipples. Mom is getting a DEBP.  Patient Name: Shannon [name] [id number] Date: 08/20/2015 Reason for consult: Follow-up assessment;Difficult latch;Breast/nipple pain   Maternal Data Does the patient have breastfeeding experience prior to this delivery?: Yes  Feeding Feeding Type: Breast Fed Length of feed: 5 min  LATCH Score/Interventions Latch: Repeated attempts needed to sustain latch, nipple held in mouth throughout feeding, stimulation needed to elicit sucking reflex. Intervention(s): Adjust position;Assist with latch;Breast massage;Breast compression  Audible Swallowing: None Intervention(s): Skin to skin;Hand  expression Intervention(s): Alternate breast massage;Hand expression  Type of Nipple: Inverted Intervention(s): Shells;Hand pump  Comfort (Breast/Nipple): Filling, red/small blisters or bruises, mild/mod discomfort  Problem noted: Mild/Moderate discomfort Interventions (Mild/moderate discomfort): Comfort gels;Breast shields;Post-pump;Pre-pump if needed;Hand expression;Hand massage;Reverse pressue  Hold (Positioning): Assistance needed to correctly position infant at breast and maintain latch. Intervention(s): Breastfeeding basics reviewed;Support Pillows;Position options;Skin to skin  LATCH Score: 3  Lactation Tools Discussed/Used Tools: Shells;Nipple Shields;Pump;Comfort gels Nipple shield size: 20 Shell Type: Inverted Breast pump type: Manual Pump Review: Setup, frequency, and cleaning;Milk Storage Initiated by:: RN/L. Sarya Linenberger RN Date initiated:: 08/20/15   Consult Status Consult Status: Complete Date: 08/20/15    Theodoro Kalata 08/20/2015, 12:46 PM

## 2015-08-20 NOTE — Anesthesia Postprocedure Evaluation (Signed)
  Anesthesia Post-op Note  Patient: Shannon [name]  Procedure(s) Performed: Procedure(s): POST PARTUM TUBAL LIGATION (N/A)  Patient Location: Mother/Baby  Anesthesia Type:Epidural  Level of Consciousness: awake, alert  and oriented  Airway and Oxygen Therapy: Patient Spontanous Breathing  Post-op Pain: none  Post-op Assessment: Post-op Vital signs reviewed, Patient's Cardiovascular Status Stable, Respiratory Function Stable, Patent Airway, No signs of Nausea or vomiting, Adequate PO intake, Pain level controlled, No headache, No backache and Patient able to bend at knees              Post-op Vital Signs: Reviewed and stable  Last Vitals:  Filed Vitals:   08/20/15 0500  BP: 117/73  Pulse: 77  Temp: 36.7 C  Resp: 20    Complications: No apparent anesthesia complications

## 2015-08-20 NOTE — Addendum Note (Signed)
Addendum  created 08/20/15 0745 by Riki Sheer, CRNA   Modules edited: Notes Section   Notes Section:  File: [id number]

## 2015-08-20 NOTE — Discharge Summary (Signed)
OB Discharge Summary  Patient Name: Shannon [name] DOB: 08/23/1981 MRN: [id number]  Date of admission: 08/18/2015 Delivering MD: Lavetta Nielsen   Date of discharge: 08/20/2015  Admitting diagnosis: 52WKS,CTX Desires Sterilization Intrauterine pregnancy: [redacted]w[redacted]d     Secondary diagnosis:Principal Problem:   SVD (spontaneous vaginal delivery) Active Problems:   Susceptible to varicella (non-immune), currently pregnant   Group beta Strep positive   History of myomectomy   Marginal insertion of umbilical cord   Latex allergy   Hypertensive disorder-resolved with weight loss   Normal labor   Active labor at term  Additional problems:     Discharge diagnosis: Term Pregnancy Delivered                                                                     Post partum procedures:n/a  Augmentation: Pitocin  Complications: None  Hospital course:  Onset of Labor With Vaginal Delivery     34 y.o. yo G3P2011 at [redacted]w[redacted]d was admitted in Active Laboron 08/18/2015. Patient had an uncomplicated labor course as follows:  Membrane Rupture Time/Date: 10:25 AM ,08/18/2015   Intrapartum Procedures: Episiotomy: None [1]                                         Lacerations:     Patient had a delivery of a Viable infant. 08/18/2015  Information for the patient's newborn:  Lisvet, Rasheed [[id number]]  Delivery Method: Vaginal, Spontaneous Delivery (Filed from Delivery Summary)    Pateint had an uncomplicated postpartum course.  She is ambulating, tolerating a regular diet, passing flatus, and urinating well. Patient is discharged home in stable condition on No discharge date for patient encounter.Marland Kitchen    Physical exam  Filed Vitals:   08/19/15 2033 08/20/15 0055 08/20/15 0500 08/20/15 0852  BP: 112/72 119/73 117/73 110/63  Pulse: 87 81 77 95  Temp: 98.7 F (37.1 C) 98.2 F (36.8 C) 98.1 F (36.7 C) 98.3 F (36.8 C)  TempSrc:    Oral  Resp: 20 18 20 18   Height:      Weight:      SpO2:     98%   General: alert and cooperative Lochia: appropriate Uterine Fundus: firm Incision: Healing well with no significant drainage DVT Evaluation: No evidence of DVT seen on physical exam. Labs: Lab Results  Component Value Date   WBC 11.6* 08/19/2015   HGB 9.9* 08/19/2015   HCT 29.2* 08/19/2015   MCV 80.4 08/19/2015   PLT 157 08/19/2015   CMP Latest Ref Rng 02/05/2014  Glucose 70 - 99 mg/dL 92  BUN 6 - 23 mg/dL 13  Creatinine 0.50 - 1.10 mg/dL 0.91  Sodium 137 - 147 mEq/L 139  Potassium 3.7 - 5.3 mEq/L 4.0  Chloride 96 - 112 mEq/L 102  CO2 19 - 32 mEq/L 28  Calcium 8.4 - 10.5 mg/dL 8.7  Total Protein 6.0 - 8.3 g/dL 5.3(L)  Total Bilirubin 0.3 - 1.2 mg/dL <0.2(L)  Alkaline Phos 39 - 117 U/L 54  AST 0 - 37 U/L 28  ALT 0 - 35 U/L 42(H)    Discharge instruction: per After Visit Summary  and "Baby and Me Booklet".  Medications:  Current facility-administered medications:  .  acetaminophen (TYLENOL) tablet 650 mg, 650 mg, Oral, Q4H PRN, Gavin Pound, CNM .  benzocaine-Menthol (DERMOPLAST) 20-0.5 % topical spray 1 application, 1 application, Topical, PRN, Gavin Pound, CNM, 1 application at 93/81/01 2301 .  witch hazel-glycerin (TUCKS) pad 1 application, 1 application, Topical, PRN **AND** dibucaine (NUPERCAINAL) 1 % rectal ointment 1 application, 1 application, Rectal, PRN, Gavin Pound, CNM .  diphenhydrAMINE (BENADRYL) capsule 25 mg, 25 mg, Oral, Q6H PRN, Gavin Pound, CNM .  fentaNYL (SUBLIMAZE) injection 25-50 mcg, 25-50 mcg, Intravenous, Q5 min PRN, Josephine Igo, MD, 50 mcg at 08/19/15 1520 .  ibuprofen (ADVIL,MOTRIN) tablet 600 mg, 600 mg, Oral, 4 times per day, Gavin Pound, CNM, 600 mg at 08/20/15 7510 .  lanolin ointment, , Topical, PRN, Gavin Pound, CNM .  lidocaine (PF) (XYLOCAINE) 1 % injection 30 mL, 30 mL, Subcutaneous, PRN, Lavetta Nielsen, CNM, 30 mL at 08/18/15 1904 .  meperidine (DEMEROL) injection 6.25-12.5 mg, 6.25-12.5 mg, Intravenous, Q5 min PRN, Josephine Igo, MD .  metoCLOPramide (REGLAN) injection 10 mg, 10 mg, Intravenous, Once PRN, Josephine Igo, MD .  ondansetron [facility]) tablet 4 mg, 4 mg, Oral, Q4H PRN **OR** ondansetron (ZOFRAN) injection 4 mg, 4 mg, Intravenous, Q4H PRN, Gavin Pound, CNM, 4 mg at 08/19/15 1245 .  oxyCODONE-acetaminophen (PERCOCET/ROXICET) 5-325 MG per tablet 1 tablet, 1 tablet, Oral, Q4H PRN, Gavin Pound, CNM, 1 tablet at 08/19/15 0245 .  oxyCODONE-acetaminophen (PERCOCET/ROXICET) 5-325 MG per tablet 2 tablet, 2 tablet, Oral, Q4H PRN, Gavin Pound, CNM, 2 tablet at 08/20/15 0612 .  prenatal multivitamin tablet 1 tablet, 1 tablet, Oral, Q1200, Gavin Pound, CNM, 1 tablet at 08/19/15 1137 .  senna-docusate (Senokot-S) tablet 2 tablet, 2 tablet, Oral, Q24H, Gavin Pound, CNM, 2 tablet at 08/19/15 2306 .  simethicone (MYLICON) chewable tablet 80 mg, 80 mg, Oral, PRN, Gavin Pound, CNM .  Tdap (BOOSTRIX) injection 0.5 mL, 0.5 mL, Intramuscular, Once, Gavin Pound, CNM .  zolpidem (AMBIEN) tablet 5 mg, 5 mg, Oral, QHS PRN, Gavin Pound, CNM After Visit Meds:    Medication List    ASK your doctor about these medications        acetaminophen 500 MG tablet  Commonly known as:  TYLENOL  Take 1,000 mg by mouth every 6 (six) hours as needed for mild pain.     albuterol 108 (90 BASE) MCG/ACT inhaler  Commonly known as:  PROVENTIL HFA;VENTOLIN HFA  Inhale 2 puffs into the lungs every 6 (six) hours as needed for wheezing or shortness of breath.     calcium carbonate 500 MG chewable tablet  Commonly known as:  TUMS - dosed in mg elemental calcium  Chew 3 tablets by mouth 2 (two) times daily as needed for indigestion or heartburn.     Melatonin 5 MG Caps  Take 10 mg by mouth at bedtime.     prenatal multivitamin Tabs tablet  Take 1 tablet by mouth daily at 12 noon.     PRILOSEC PO  Take 1 tablet by mouth daily as needed (indigestion).     Vitamin D 2000 UNITS Caps  Take 4,000 Units by mouth daily.        Diet:  routine diet  Activity: Advance as tolerated. Pelvic rest for 6 weeks.   Outpatient follow up:6 weeks Follow up Appt:No future appointments. Follow up visit: No Follow-up on file.  Postpartum contraception: Tubal Ligation  Newborn Data: Live born female  Birth  Weight: 7 lb 3.2 oz (3266 g) APGAR: 8, 9  Baby Feeding: Breast Disposition:home with mother   08/20/2015 Sascha Palma, CNM      Postpartum Care After Vaginal Delivery  After you deliver your newborn (postpartum period), the usual stay in the hospital is 24 72 hours. If there were problems with your labor or delivery, or if you have other medical problems, you might be in the hospital longer.  While you are in the hospital, you will receive help and instructions on how to care for yourself and your newborn during the postpartum period.  While you are in the hospital:  Be sure to tell your nurses if you have pain or discomfort, as well as where you feel the pain and what makes the pain worse.  If you had an incision made near your vagina (episiotomy) or if you had some tearing during delivery, the nurses may put ice packs on your episiotomy or tear. The ice packs may help to reduce the pain and swelling.  If you are breastfeeding, you may feel uncomfortable contractions of your uterus for a couple of weeks. This is normal. The contractions help your uterus get back to normal size.  It is normal to have some bleeding after delivery.  For the first 1 3 days after delivery, the flow is red and the amount may be similar to a period.  It is common for the flow to start and stop.  In the first few days, you may pass some small clots. Let your nurses know if you begin to pass large clots or your flow increases.  Do not  flush blood clots down the toilet before having the nurse look at them.  During the next 3 10 days after delivery, your flow should become more watery and pink or brown-tinged in color.  Ten to fourteen  days after delivery, your flow should be a small amount of yellowish-white discharge.  The amount of your flow will decrease over the first few weeks after delivery. Your flow may stop in 6 8 weeks. Most women have had their flow stop by 12 weeks after delivery.  You should change your sanitary pads frequently.  Wash your hands thoroughly with soap and water for at least 20 seconds after changing pads, using the toilet, or before holding or feeding your newborn.  You should feel like you need to empty your bladder within the first 6 8 hours after delivery.  In case you become weak, lightheaded, or faint, call your nurse before you get out of bed for the first time and before you take a shower for the first time.  Within the first few days after delivery, your breasts may begin to feel tender and full. This is called engorgement. Breast tenderness usually goes away within 48 72 hours after engorgement occurs. You may also notice milk leaking from your breasts. If you are not breastfeeding, do not stimulate your breasts. Breast stimulation can make your breasts produce more milk.  Spending as much time as possible with your newborn is very important. During this time, you and your newborn can feel close and get to know each other. Having your newborn stay in your room (rooming in) will help to strengthen the bond with your newborn. It will give you time to get to know your newborn and become comfortable caring for your newborn.  Your hormones change after delivery. Sometimes the hormone changes can temporarily cause you to feel sad or tearful. These feelings  should not last more than a few days. If these feelings last longer than that, you should talk to your caregiver.  If desired, talk to your caregiver about methods of family planning or contraception.  Talk to your caregiver about immunizations. Your caregiver may want you to have the following immunizations before leaving the  hospital:  Tetanus, diphtheria, and pertussis (Tdap) or tetanus and diphtheria (Td) immunization. It is very important that you and your family (including grandparents) or others caring for your newborn are up-to-date with the Tdap or Td immunizations. The Tdap or Td immunization can help protect your newborn from getting ill.  Rubella immunization.  Varicella (chickenpox) immunization.  Influenza immunization. You should receive this annual immunization if you did not receive the immunization during your pregnancy. Document Released: 08/05/2007 Document Revised: 07/02/2012 Document Reviewed: 06/04/2012 [facility] Patient Information 2014 [place].   Postpartum Depression and Baby Blues  The postpartum period begins right after the birth of a baby. During this time, there is often a great amount of joy and excitement. It is also a time of considerable changes in the life of the parent(s). Regardless of how many times a mother gives birth, each child brings new challenges and dynamics to the family. It is not unusual to have feelings of excitement accompanied by confusing shifts in moods, emotions, and thoughts. All mothers are at risk of developing postpartum depression or the "baby blues." These mood changes can occur right after giving birth, or they may occur many months after giving birth. The baby blues or postpartum depression can be mild or severe. Additionally, postpartum depression can resolve rather quickly, or it can be a long-term condition. CAUSES Elevated hormones and their rapid decline are thought to be a main cause of postpartum depression and the baby blues. There are a number of hormones that radically change during and after pregnancy. Estrogen and progesterone usually decrease immediately after delivering your baby. The level of thyroid hormone and various cortisol steroids also rapidly drop. Other factors that play a major role in these changes include major life events  and genetics.  RISK FACTORS If you have any of the following risks for the baby blues or postpartum depression, know what symptoms to watch out for during the postpartum period. Risk factors that may increase the likelihood of getting the baby blues or postpartum depression include: 1. Havinga personal or family history of depression. 2. Having depression while being pregnant. 3. Having premenstrual or oral contraceptive-associated mood issues. 4. Having exceptional life stress. 5. Having marital conflict. 6. Lacking a social support network. 7. Having a baby with special needs. 8. Having health problems such as diabetes. SYMPTOMS Baby blues symptoms include:  Brief fluctuations in mood, such as going from extreme happiness to sadness.  Decreased concentration.  Difficulty sleeping.  Crying spells, tearfulness.  Irritability.  Anxiety. Postpartum depression symptoms typically begin within the first month after giving birth. These symptoms include:  Difficulty sleeping or excessive sleepiness.  Marked weight loss.  Agitation.  Feelings of worthlessness.  Lack of interest in activity or food. Postpartum psychosis is a very concerning condition and can be dangerous. Fortunately, it is rare. Displaying any of the following symptoms is cause for immediate medical attention. Postpartum psychosis symptoms include:  Hallucinations and delusions.  Bizarre or disorganized behavior.  Confusion or disorientation. DIAGNOSIS  A diagnosis is made by an evaluation of your symptoms. There are no medical or lab tests that lead to a diagnosis, but there are various questionnaires  that a caregiver may use to identify those with the baby blues, postpartum depression, or psychosis. Often times, a screening tool called the [place] Postnatal Depression Scale is used to diagnose depression in the postpartum period.  TREATMENT The baby blues usually goes away on its own in 1 to 2 weeks. Social  support is often all that is needed. You should be encouraged to get adequate sleep and rest. Occasionally, you may be given medicines to help you sleep.  Postpartum depression requires treatment as it can last several months or longer if it is not treated. Treatment may include individual or group therapy, medicine, or both to address any social, physiological, and psychological factors that may play a role in the depression. Regular exercise, a healthy diet, rest, and social support may also be strongly recommended.  Postpartum psychosis is more serious and needs treatment right away. Hospitalization is often needed. HOME CARE INSTRUCTIONS  Get as much rest as you can. Nap when the baby sleeps.  Exercise regularly. Some women find yoga and walking to be beneficial.  Eat a balanced and nourishing diet.  Do little things that you enjoy. Have a cup of tea, take a bubble bath, read your favorite magazine, or listen to your favorite music.  Avoid alcohol.  Ask for help with household chores, cooking, grocery shopping, or running errands as needed. Do not try to do everything.  Talk to people close to you about how you are feeling. Get support from your partner, family members, friends, or other new moms.  Try to stay positive in how you think. Think about the things you are grateful for.  Do not spend a lot of time alone.  Only take medicine as directed by your caregiver.  Keep all your postpartum appointments.  Let your caregiver know if you have any concerns. SEEK MEDICAL CARE IF: You are having a reaction or problems with your medicine. SEEK IMMEDIATE MEDICAL CARE IF:  You have suicidal feelings.  You feel you may harm the baby or someone else. Document Released: 07/12/2004 Document Revised: 12/31/2011 Document Reviewed: 08/14/2011 [facility] Patient Information 2014 [place], [place].     Breastfeeding Deciding to breastfeed is one of the best choices you can make for you and  your baby. A change in hormones during pregnancy causes your breast tissue to grow and increases the number and size of your milk ducts. These hormones also allow proteins, sugars, and fats from your blood supply to make breast milk in your milk-producing glands. Hormones prevent breast milk from being released before your baby is born as well as prompt milk flow after birth. Once breastfeeding has begun, thoughts of your baby, as well as his or her sucking or crying, can stimulate the release of milk from your milk-producing glands.  BENEFITS OF BREASTFEEDING For Your Baby  Your first milk (colostrum) helps your baby's digestive system function better.   There are antibodies in your milk that help your baby fight off infections.   Your baby has a lower incidence of asthma, allergies, and sudden infant death syndrome.   The nutrients in breast milk are better for your baby than infant formulas and are designed uniquely for your baby's needs.   Breast milk improves your baby's brain development.   Your baby is less likely to develop other conditions, such as childhood obesity, asthma, or type 2 diabetes mellitus.  For You   Breastfeeding helps to create a very special bond between you and your baby.   Breastfeeding  is convenient. Breast milk is always available at the correct temperature and costs nothing.   Breastfeeding helps to burn calories and helps you lose the weight gained during pregnancy.   Breastfeeding makes your uterus contract to its prepregnancy size faster and slows bleeding (lochia) after you give birth.   Breastfeeding helps to lower your risk of developing type 2 diabetes mellitus, osteoporosis, and breast or ovarian cancer later in life. SIGNS THAT YOUR BABY IS HUNGRY Early Signs of Hunger  Increased alertness or activity.  Stretching.  Movement of the head from side to side.  Movement of the head and opening of the mouth when the corner of the mouth  or cheek is stroked (rooting).  Increased sucking sounds, smacking lips, cooing, sighing, or squeaking.  Hand-to-mouth movements.  Increased sucking of fingers or hands. Late Signs of Hunger  Fussing.  Intermittent crying. Extreme Signs of Hunger Signs of extreme hunger will require calming and consoling before your baby will be able to breastfeed successfully. Do not wait for the following signs of extreme hunger to occur before you initiate breastfeeding:   Restlessness.  A loud, strong cry.   Screaming.   BREASTFEEDING BASICS Breastfeeding Initiation  Find a comfortable place to sit or lie down, with your neck and back well supported.  Place a pillow or rolled up blanket under your baby to bring him or her to the level of your breast (if you are seated). Nursing pillows are specially designed to help support your arms and your baby while you breastfeed.  Make sure that your baby's abdomen is facing your abdomen.   Gently massage your breast. With your fingertips, massage from your chest wall toward your nipple in a circular motion. This encourages milk flow. You may need to continue this action during the feeding if your milk flows slowly.  Support your breast with 4 fingers underneath and your thumb above your nipple. Make sure your fingers are well away from your nipple and your baby's mouth.   Stroke your baby's lips gently with your finger or nipple.   When your baby's mouth is open wide enough, quickly bring your baby to your breast, placing your entire nipple and as much of the colored area around your nipple (areola) as possible into your baby's mouth.   More areola should be visible above your baby's upper lip than below the lower lip.   Your baby's tongue should be between his or her lower gum and your breast.   Ensure that your baby's mouth is correctly positioned around your nipple (latched). Your baby's lips should create a seal on your breast and be  turned out (everted).  It is common for your baby to suck about 2-3 minutes in order to start the flow of breast milk. Latching Teaching your baby how to latch on to your breast properly is very important. An improper latch can cause nipple pain and decreased milk supply for you and poor weight gain in your baby. Also, if your baby is not latched onto your nipple properly, he or she may swallow some air during feeding. This can make your baby fussy. Burping your baby when you switch breasts during the feeding can help to get rid of the air. However, teaching your baby to latch on properly is still the best way to prevent fussiness from swallowing air while breastfeeding. Signs that your baby has successfully latched on to your nipple:    Silent tugging or silent sucking, without causing you pain.  Swallowing heard between every 3-4 sucks.    Muscle movement above and in front of his or her ears while sucking.  Signs that your baby has not successfully latched on to nipple:   Sucking sounds or smacking sounds from your baby while breastfeeding.  Nipple pain. If you think your baby has not latched on correctly, slip your finger into the corner of your baby's mouth to break the suction and place it between your baby's gums. Attempt breastfeeding initiation again. Signs of Successful Breastfeeding Signs from your baby:   A gradual decrease in the number of sucks or complete cessation of sucking.   Falling asleep.   Relaxation of his or her body.   Retention of a small amount of milk in his or her mouth.   Letting go of your breast by himself or herself. Signs from you:  Breasts that have increased in firmness, weight, and size 1-3 hours after feeding.   Breasts that are softer immediately after breastfeeding.  Increased milk volume, as well as a change in milk consistency and color by the fifth day of breastfeeding.   Nipples that are not sore, cracked, or  bleeding. Signs That Your [employer]el Books is Getting Enough Milk  Wetting at least 3 diapers in a 24-hour period. The urine should be clear and pale yellow by age 379 days.  At least 3 stools in a 24-hour period by age 379 days. The stool should be soft and yellow.  At least 3 stools in a 24-hour period by age 59 days. The stool should be seedy and yellow.  No loss of weight greater than 10% of birth weight during the first 43 days of age.  Average weight gain of 4-7 ounces (113-198 g) per week after age 37 days.  Consistent daily weight gain by age 34 days, without weight loss after the age of 2 weeks. After a feeding, your baby may spit up a small amount. This is common. BREASTFEEDING FREQUENCY AND DURATION Frequent feeding will help you make more milk and can prevent sore nipples and breast engorgement. Breastfeed when you feel the need to reduce the fullness of your breasts or when your baby shows signs of hunger. This is called "breastfeeding on demand." Avoid introducing a pacifier to your baby while you are working to establish breastfeeding (the first 4-6 weeks after your baby is born). After this time you may choose to use a pacifier. Research has shown that pacifier use during the first year of a baby's life decreases the risk of sudden infant death syndrome (SIDS). Allow your baby to feed on each breast as long as he or she wants. Breastfeed until your baby is finished feeding. When your baby unlatches or falls asleep while feeding from the first breast, offer the second breast. Because newborns are often sleepy in the first few weeks of life, you may need to awaken your baby to get him or her to feed. Breastfeeding times will vary from baby to baby. However, the following rules can serve as a guide to help you ensure that your baby is properly fed:  Newborns (babies 4 weeks of age or younger) may breastfeed every 1-3 hours.  Newborns should not go longer than 3 hours during the day or 5 hours during  the night without breastfeeding.  You should breastfeed your baby a minimum of 8 times in a 24-hour period until you begin to introduce solid foods to your baby at around 33 months of age. BREAST MILK PUMPING  Pumping and storing breast milk allows you to ensure that your baby is exclusively fed your breast milk, even at times when you are unable to breastfeed. This is especially important if you are going back to work while you are still breastfeeding or when you are not able to be present during feedings. Your lactation consultant can give you guidelines on how long it is safe to store breast milk.  A breast pump is a machine that allows you to pump milk from your breast into a sterile bottle. The pumped breast milk can then be stored in a refrigerator or freezer. Some breast pumps are operated by hand, while others use electricity. Ask your lactation consultant which type will work best for you. Breast pumps can be purchased, but some hospitals and breastfeeding support groups lease breast pumps on a monthly basis. A lactation consultant can teach you how to hand express breast milk, if you prefer not to use a pump.  CARING FOR YOUR BREASTS WHILE YOU BREASTFEED Nipples can become dry, cracked, and sore while breastfeeding. The following recommendations can help keep your breasts moisturized and healthy:  Avoid using soap on your nipples.   Wear a supportive bra. Although not required, special nursing bras and tank tops are designed to allow access to your breasts for breastfeeding without taking off your entire bra or top. Avoid wearing underwire-style bras or extremely tight bras.  Air dry your nipples for 3-53minutes after each feeding.   Use only cotton bra pads to absorb leaked breast milk. Leaking of breast milk between feedings is normal.   Use lanolin on your nipples after breastfeeding. Lanolin helps to maintain your skin's normal moisture barrier. If you use pure lanolin, you do not  need to wash it off before feeding your baby again. Pure lanolin is not toxic to your baby. You may also hand express a few drops of breast milk and gently massage that milk into your nipples and allow the milk to air dry. In the first few weeks after giving birth, some women experience extremely full breasts (engorgement). Engorgement can make your breasts feel heavy, warm, and tender to the touch. Engorgement peaks within 3-5 days after you give birth. The following recommendations can help ease engorgement:  Completely empty your breasts while breastfeeding or pumping. You may want to start by applying warm, moist heat (in the shower or with warm water-soaked hand towels) just before feeding or pumping. This increases circulation and helps the milk flow. If your baby does not completely empty your breasts while breastfeeding, pump any extra milk after he or she is finished.  Wear a snug bra (nursing or regular) or tank top for 1-2 days to signal your body to slightly decrease milk production.  Apply ice packs to your breasts, unless this is too uncomfortable for you.  Make sure that your baby is latched on and positioned properly while breastfeeding. If engorgement persists after 48 hours of following these recommendations, contact your health care provider or a Science writer. OVERALL HEALTH CARE RECOMMENDATIONS WHILE BREASTFEEDING  Eat healthy foods. Alternate between meals and snacks, eating 3 of each per day. Because what you eat affects your breast milk, some of the foods may make your baby more irritable than usual. Avoid eating these foods if you are sure that they are negatively affecting your baby.  Drink milk, fruit juice, and water to satisfy your thirst (about 10 glasses a day).   Rest often, relax, and continue to take your  prenatal vitamins to prevent fatigue, stress, and anemia.  Continue breast self-awareness checks.  Avoid chewing and smoking tobacco.  Avoid alcohol  and drug use. Some medicines that may be harmful to your baby can pass through breast milk. It is important to ask your health care provider before taking any medicine, including all over-the-counter and prescription medicine as well as vitamin and herbal supplements. It is possible to become pregnant while breastfeeding. If birth control is desired, ask your health care provider about options that will be safe for your baby. SEEK MEDICAL CARE IF:   You feel like you want to stop breastfeeding or have become frustrated with breastfeeding.  You have painful breasts or nipples.  Your nipples are cracked or bleeding.  Your breasts are red, tender, or warm.  You have a swollen area on either breast.  You have a fever or chills.  You have nausea or vomiting.  You have drainage other than breast milk from your nipples.  Your breasts do not become full before feedings by the fifth day after you give birth.  You feel sad and depressed.  Your baby is too sleepy to eat well.  Your baby is having trouble sleeping.   Your baby is wetting less than 3 diapers in a 24-hour period.  Your baby has less than 3 stools in a 24-hour period.  Your baby's skin or the white part of his or her eyes becomes yellow.   Your baby is not gaining weight by 50 days of age. SEEK IMMEDIATE MEDICAL CARE IF:   Your baby is overly tired (lethargic) and does not want to wake up and feed.  Your baby develops an unexplained fever. Document Released: 10/08/2005 Document Revised: 10/13/2013 Document Reviewed: 04/01/2013 [facility] Patient Information 2015 [place], [place]. This information is not intended to replace advice given to you by your health care provider. Make sure you discuss any questions you have with your health care provider.

## 2015-08-23 ENCOUNTER — Encounter (HOSPITAL_COMMUNITY): Payer: Self-pay | Admitting: Obstetrics and Gynecology

## 2015-08-25 ENCOUNTER — Ambulatory Visit (HOSPITAL_COMMUNITY)
Admission: RE | Admit: 2015-08-25 | Discharge: 2015-08-25 | Disposition: A | Discharge status: Home / Self Care | Payer: BC Managed Care – PPO | Source: Ambulatory Visit | Attending: Obstetrics and Gynecology | Admitting: Obstetrics and Gynecology

## 2015-08-25 NOTE — Lactation Note (Signed)
Lactation Consult  Mother's reason for visit: Mother scheduled a visit .mother concerned about supply. She has started pumping and supplementing with ebm and formula. Mother is pumping 3 times daily for 15 mins on each. Mother has request an electric pump from her insurance company 3 days ago. It has not arrived yet. Mother states that infant was feeding well in the hospital but she got cracking and was fit with a nipple shield. When she got home she couldn't tolerate the use of the nipple shield and began supplementing with bottle ,.   Visit Type:  Feeding assessment  Consult:  Initial Lactation Consultant:  Shannon [name]  ________________________________________________________________________  Shannon [name] Name: Shannon [name] Date of Birth: 08/18/2015 Pediatrician: Dr Shannon [name] Gender: female Gestational Age: [redacted]w[redacted]d (At Birth) Birth Weight: 7 lb 3.2 oz (3266 g) Weight at Discharge: Weight: 6 lb 13.5 oz (3105 g)Date of Discharge: 08/20/2015 Filed Weights   08/18/15 1853 08/19/15 2300  Weight: 7 lb 3.2 oz (3266 g) 6 lb 13.5 oz (3105 g)    Weight today: 7-5.5,3330     Admission Information     Provider Service Admission Date    Shannon Odea, MD Newborn 08/18/2015          ADT Events       Unit Room Bed Service Event    08/18/15 1853 [facility] [id number] Newborn Admission    08/18/15 1904 [place] [id number] Newborn Patient Update    08/18/15 [place] 9197 [id number] Newborn Transfer Out    08/18/15 [place] 9150 [id number] Newborn Transfer In    08/20/15 1411 [place] [id number] Newborn Discharge      Weight Information (last 3 days) before discharge     Date/Time   Weight   BSA (Calculated - sq m)   BMI (Calculated) Who      08/19/15 2300  6 lb 13.5 oz (3105 g)  --  -- CM     08/18/15 1853  7 lb 3.2 oz (3266 g)  0.22 sq meters  11.5 ER           Weights      ________________________________________________________________________  Mother's Name: Shannon [name] Type of delivery:  vaginal del Breastfeeding Experience:  2 months with supplementing with first child Maternal Medical Conditions:  none Maternal Medications:  Prenatal vits, motrin, vit D, unknown meds for swelling, 2 pills  ________________________________________________________________________  Breastfeeding History (Post Discharge)  Frequency of breastfeeding: every 2-3 hours, 5 -10 mins attempts  Duration of feeding: 5-10 mins  Supplementation  Formula:  Volume 45-60 ml every 2-3 hours        Brand: Similac  Breastmilk:  Volume 15- 30 ml 3 times daily    Method:  Bottle,   Pumping  Type of pump:  Manual Frequency:  2 times daily Volume:  15-30 ml   Infant Intake and Output Assessment  Voids: 8-10 24 hrs.  Color:  Clear yellow Stools 6-8 4 hrs.  Color:  Yellow  ________________________________________________________________________  Maternal Breast Assessment  Breast:  Full Nipple:  Inverted Pain level:  0 Pain interventions:  Bra and Expressed breast milk  _______________________________________________________________________ Feeding Assessment/Evaluation:  Mothers nipples are semi flat and slightly dimpled. I had her to pre-pump for 1-2 mins with hand pump. Mothers nipple very erect.  Infant latched on with good depth and wide open mouth. He sustained latch for 15-17 mins and transferred 16 ml  Alternate breast was offered in cross cradle hold. Mother again pre pumped her nipple  for firmness and  infant latched well with audible swallows. Infant sustained latch for 22 mins    Infant's oral assessment:  Variance  Positioning:  Football Left breast  LATCH documentation:  Latch:  2 = Grasps breast easily, tongue down, lips flanged, rhythmical sucking.  Audible swallowing:  2 = Spontaneous and intermittent  Type of nipple:  2 = Everted at rest  and after stimulation  Comfort (Breast/Nipple):  1 = Filling, red/small blisters or bruises, mild/mod discomfort  Hold (Positioning):  1 = Assistance needed to correctly position infant at breast and maintain latch  LATCH score:  8  Attached assessment:  Deep  Lips flanged:  Yes.    Lips untucked:  Yes.    Suck assessment:  Nutritive   Pre-feed weight:  3330 g  7-5.5 Post-feed weight 3346g 7-6.0 Amount transferred:  52ml   Infant's oral assessment:  WNL  Positioning:  Cross cradle Right breast  LATCH documentation:  Latch:  2 = Grasps breast easily, tongue down, lips flanged, rhythmical sucking.  Audible swallowing:  2 = Spontaneous and intermittent  Type of nipple:  2 = Everted at rest and after stimulation  Comfort (Breast/Nipple):  1 = Filling, red/small blisters or bruises, mild/mod discomfort  Hold (Positioning):  1 = Assistance needed to correctly position infant at breast and maintain latch  LATCH score:  8  Attached assessment:  Deep  Lips flanged:  Yes.    Lips untucked:  Yes.    Suck assessment:  Nutritive   Pre-feed weight:  3346g  Post-feed weight: 3366   Amount transferred: 20 ml Amount supplemented:    Infant offer left breast again for additional feeding. Transferred 6 ml    Total amount transferred: 42 ml   Advised mother to pre pump for 1-2 mins with hand pump to firm nipple. Advised mother to breastfeed on cue at least 8-12 times daily Mother to post pump for 15 mins on each breast  Supplement infant with any breastmilk after feeding at least 30 ml ebm /formula Advised mother to follow up with [facility] for prn visit, she states she will call as needed Advised to come to [facility] Prn .

## 2015-08-26 ENCOUNTER — Encounter (HOSPITAL_COMMUNITY): Payer: Self-pay | Admitting: *Deleted

## 2015-08-26 ENCOUNTER — Inpatient Hospital Stay (HOSPITAL_COMMUNITY)
Admission: AD | Admit: 2015-08-26 | Discharge: 2015-08-28 | DRG: 776 | Disposition: A | Discharge status: Home / Self Care | Payer: BC Managed Care – PPO | Source: Ambulatory Visit | Attending: Obstetrics & Gynecology | Admitting: Obstetrics & Gynecology

## 2015-08-26 DIAGNOSIS — R519 Headache, unspecified: Secondary | ICD-10-CM | POA: Diagnosis present

## 2015-08-26 DIAGNOSIS — I158 Other secondary hypertension: Secondary | ICD-10-CM | POA: Diagnosis present

## 2015-08-26 DIAGNOSIS — R51 Headache: Secondary | ICD-10-CM

## 2015-08-26 DIAGNOSIS — O9089 Other complications of the puerperium, not elsewhere classified: Principal | ICD-10-CM | POA: Diagnosis present

## 2015-08-26 DIAGNOSIS — Z91040 Latex allergy status: Secondary | ICD-10-CM | POA: Diagnosis present

## 2015-08-26 DIAGNOSIS — O1495 Unspecified pre-eclampsia, complicating the puerperium: Secondary | ICD-10-CM

## 2015-08-26 HISTORY — DX: Essential (primary) hypertension: I10

## 2015-08-26 HISTORY — DX: Unspecified pre-eclampsia, complicating the puerperium: O14.95

## 2015-08-26 LAB — URINALYSIS, ROUTINE W REFLEX MICROSCOPIC
BILIRUBIN URINE: NEGATIVE
Glucose, UA: NEGATIVE mg/dL
KETONES UR: NEGATIVE mg/dL
NITRITE: NEGATIVE
Protein, ur: NEGATIVE mg/dL
Specific Gravity, Urine: 1.02 (ref 1.005–1.030)
UROBILINOGEN UA: 0.2 mg/dL (ref 0.0–1.0)
pH: 7 (ref 5.0–8.0)

## 2015-08-26 LAB — COMPREHENSIVE METABOLIC PANEL
ALK PHOS: 101 U/L (ref 38–126)
ALT: 32 U/L (ref 14–54)
ANION GAP: 5 (ref 5–15)
AST: 25 U/L (ref 15–41)
Albumin: 3.1 g/dL — ABNORMAL LOW (ref 3.5–5.0)
BILIRUBIN TOTAL: 0.2 mg/dL — AB (ref 0.3–1.2)
BUN: 10 mg/dL (ref 6–20)
CALCIUM: 9.1 mg/dL (ref 8.9–10.3)
CO2: 25 mmol/L (ref 22–32)
CREATININE: 0.79 mg/dL (ref 0.44–1.00)
Chloride: 111 mmol/L (ref 101–111)
Glucose, Bld: 84 mg/dL (ref 65–99)
Potassium: 3.4 mmol/L — ABNORMAL LOW (ref 3.5–5.1)
Sodium: 141 mmol/L (ref 135–145)
TOTAL PROTEIN: 6 g/dL — AB (ref 6.5–8.1)

## 2015-08-26 LAB — CBC
HCT: 33.8 % — ABNORMAL LOW (ref 36.0–46.0)
Hemoglobin: 11 g/dL — ABNORMAL LOW (ref 12.0–15.0)
MCH: 26.6 pg (ref 26.0–34.0)
MCHC: 32.5 g/dL (ref 30.0–36.0)
MCV: 81.8 fL (ref 78.0–100.0)
PLATELETS: 241 10*3/uL (ref 150–400)
RBC: 4.13 MIL/uL (ref 3.87–5.11)
RDW: 14.1 % (ref 11.5–15.5)
WBC: 6.2 10*3/uL (ref 4.0–10.5)

## 2015-08-26 LAB — URINE MICROSCOPIC-ADD ON

## 2015-08-26 MED ORDER — BUTALBITAL-APAP-CAFFEINE 50-325-40 MG PO TABS
2.0000 | ORAL_TABLET | Freq: Once | ORAL | Status: AC
  Administered 2015-08-26: 2 via ORAL
  Filled 2015-08-26: qty 2

## 2015-08-26 MED ORDER — BUTALBITAL-APAP-CAFFEINE 50-325-40 MG PO TABS
2.0000 | ORAL_TABLET | Freq: Four times a day (QID) | ORAL | Status: DC | PRN
  Administered 2015-08-26 – 2015-08-27 (×2): 2 via ORAL
  Filled 2015-08-26 (×2): qty 2

## 2015-08-26 MED ORDER — LACTATED RINGERS IV SOLN
INTRAVENOUS | Status: AC
  Administered 2015-08-26 – 2015-08-27 (×2): via INTRAVENOUS

## 2015-08-26 MED ORDER — HYDRALAZINE HCL 20 MG/ML IJ SOLN: 10.0000 mg | Freq: Once | INTRAMUSCULAR | Status: DC | PRN

## 2015-08-26 MED ORDER — LABETALOL HCL 5 MG/ML IV SOLN: 20.0000 mg | INTRAVENOUS | Status: DC | PRN

## 2015-08-26 MED ORDER — MAGNESIUM SULFATE 50 % IJ SOLN
2.0000 g/h | INTRAMUSCULAR | Status: AC
  Administered 2015-08-26 – 2015-08-27 (×2): 2 g/h via INTRAVENOUS
  Filled 2015-08-26 (×2): qty 80

## 2015-08-26 MED ORDER — IBUPROFEN 600 MG PO TABS
600.0000 mg | ORAL_TABLET | Freq: Four times a day (QID) | ORAL | Status: DC | PRN
  Administered 2015-08-26 – 2015-08-28 (×5): 600 mg via ORAL
  Filled 2015-08-26 (×5): qty 1

## 2015-08-26 MED ORDER — MAGNESIUM SULFATE BOLUS VIA INFUSION
4.0000 g | Freq: Once | INTRAVENOUS | Status: AC
  Administered 2015-08-26: 4 g via INTRAVENOUS
  Filled 2015-08-26: qty 500

## 2015-08-26 NOTE — Progress Notes (Signed)
Dr. Alesia Richards @ bedside talking to pt and eval status.

## 2015-08-26 NOTE — H&P (Addendum)
Evvie Bollig is a 34 y.o. female P2 sent from the office for postpartum preeclampsia.  She has been complaining of persistent  headache and was found to have elevated LFTs (on 11/2: AST 32, ALT 39).  She has also had elevated blood pressures.    Headache is described as being mostly behind the eyes bilaterally and just above the neck area.     Patient Active Problem List   Diagnosis Date Noted  . Pre-eclampsia in postpartum period 08/26/2015  . Susceptible to varicella (non-immune), currently pregnant 08/18/2015  . History of myomectomy 08/18/2015  . Latex allergy 08/18/2015  . Hypertensive disorder-resolved with weight loss 08/18/2015  . SVD (spontaneous vaginal delivery) 08/18/2015  . Depression 01/19/2013  . Vitamin D deficiency 11/21/2012  . OVERWEIGHT 03/25/2009  . BACK PAIN WITH RADICULOPATHY 03/23/2009  . ASTHMA, UNSPECIFIED, UNSPECIFIED STATUS 10/03/2008   Migraine Headache history in high school.   OB History    Gravida Para Term Preterm AB TAB SAB Ectopic Multiple Living   3 2 2  1  1   0 1     Past Medical History  Diagnosis Date  . Asthma   . Irregular heart beat    Past Surgical History  Procedure Laterality Date  . Tonsils and adnoids removed    . Dilatation & currettage/hysteroscopy with resectocope N/A 02/03/2014    Procedure: DILATATION & CURETTAGE/HYSTEROSCOPY ;  Surgeon: Delice Lesch, MD;  Location: [facility];  Service: Gynecology;  Laterality: N/A;  . Myomectomy N/A 02/03/2014    Procedure: MYOMECTOMY ;  Surgeon: Delice Lesch, MD;  Location: [facility];  Service: Gynecology;  Laterality: N/A;  . Tubal ligation N/A 08/19/2015    Procedure: POST PARTUM TUBAL LIGATION;  Surgeon: Everett Graff, MD;  Location: [facility];  Service: Gynecology;  Laterality: N/A;   Family History: family history includes Asthma in her brother; Cancer in her father and paternal grandmother; Diabetes in her mother; Heart disease in her maternal grandmother; Hypertension in her father,  maternal grandmother, and mother; Mental illness in her mother. Social History:  reports that she has never smoked. She has never used smokeless tobacco. She reports that she does not drink alcohol or use illicit drugs.    ROS  Constitutional: Denies fevers/chills.  With headaches.   Cardiovascular: Denies chest pain or palpitations Pulmonary: Denies coughing or wheezing Gastrointestinal: Denies nausea, vomiting or diarrhea Genitourinary: Denies pelvic pain, unusual vaginal bleeding, unusual vaginal discharge, dysuria, urgency or frequency.  Musculoskeletal: Denies muscle or joint aches and pain.  Neurology: Denies abnormal sensations such as tingling or numbness.    HOME MEDS:  Ibuprofen Albuterol inhaler Prenatal vitamin tab  Allergies  Allergen Reactions  . Pineapple Shortness Of Breath  . Ceftriaxone Sodium Hives  . Diflucan [Fluconazole] Hives    Raw rash on abdomen and she had sores and swelling in mouth   . Eggs Or Egg-Derived Products Nausea And Vomiting    Can eat cake made with eggs  . Latex Rash       Blood pressure 139/90, pulse 84, temperature 97.9 F (36.6 C), temperature source Oral, resp. rate 20, last menstrual period 11/14/2014, unknown if currently breastfeeding.  Patient is breastfeeding.  Filed Vitals:   08/26/15 1400 08/26/15 1500 08/26/15 1600 08/26/15 1615  BP: 122/90 132/87 130/81   Pulse: 75 72 63   Temp:   98 F (36.7 C)   TempSrc:   Oral   Resp: 20 20 20    Height:    5\' 4"  (  1.626 m)  Weight:    104.327 kg (230 lb)  SpO2:       Chest clear  Heart RRR without murmur Abd fundus firm 2FB below umbulicus. BTL infraumbilical incision: Clean, dry, intact.   Ext: Warm and well perfused, 2+ edema bilaterally in lower extremities, 1+ patellar reflex bilaterally.  CBC    Component Value Date/Time   WBC 6.2 08/26/2015 1250   RBC 4.13 08/26/2015 1250   HGB 11.0* 08/26/2015 1250   HCT 33.8* 08/26/2015 1250   PLT 241 08/26/2015 1250   MCV 81.8  08/26/2015 1250   MCH 26.6 08/26/2015 1250   MCHC 32.5 08/26/2015 1250   RDW 14.1 08/26/2015 1250   LYMPHSABS 2.8 12/23/2013 2132   MONOABS 0.5 12/23/2013 2132   EOSABS 0.2 12/23/2013 2132   BASOSABS 0.0 12/23/2013 2132    CMP     Component Value Date/Time   NA 141 08/26/2015 1250   K 3.4* 08/26/2015 1250   CL 111 08/26/2015 1250   CO2 25 08/26/2015 1250   GLUCOSE 84 08/26/2015 1250   BUN 10 08/26/2015 1250   CREATININE 0.79 08/26/2015 1250   CREATININE 0.77 11/20/2012 1535   CALCIUM 9.1 08/26/2015 1250   PROT 6.0* 08/26/2015 1250   ALBUMIN 3.1* 08/26/2015 1250   AST 25 08/26/2015 1250   ALT 32 08/26/2015 1250   ALKPHOS 101 08/26/2015 1250   BILITOT 0.2* 08/26/2015 1250   GFRNONAA >60 08/26/2015 1250   GFRAA >60 08/26/2015 1250   Urine spot protein creatinine ratio: 11/2: Normal.   Assessment/Plan: Postpartum preeclampsia  Admit to Antenatal Fioricet for headache Magnesium sulfate for seizure prophylaxis: started 08/26/15 @ 12.30 pm. I discussed with patient plan, signs and symptoms of magnesium toxicity.   Continue with close monitoring for signs and symptoms of preeclampsia.   Waymon Amato, MD.

## 2015-08-27 ENCOUNTER — Encounter (HOSPITAL_COMMUNITY): Payer: Self-pay | Admitting: *Deleted

## 2015-08-27 MED ORDER — SODIUM CHLORIDE 0.9 % IJ SOLN
3.0000 mL | Freq: Two times a day (BID) | INTRAMUSCULAR | Status: DC
  Administered 2015-08-27 (×2): 3 mL via INTRAVENOUS

## 2015-08-27 MED ORDER — METOCLOPRAMIDE HCL 5 MG/ML IJ SOLN
10.0000 mg | Freq: Four times a day (QID) | INTRAMUSCULAR | Status: DC | PRN
  Administered 2015-08-27: 10 mg via INTRAVENOUS
  Filled 2015-08-27: qty 2

## 2015-08-27 MED ORDER — ONDANSETRON HCL 4 MG/2ML IJ SOLN
4.0000 mg | Freq: Four times a day (QID) | INTRAMUSCULAR | Status: DC | PRN
Start: 2015-08-27 — End: 2015-08-28
  Administered 2015-08-27: 4 mg via INTRAVENOUS
  Filled 2015-08-27: qty 2

## 2015-08-27 MED ORDER — OXYCODONE HCL 5 MG PO TABS
5.0000 mg | ORAL_TABLET | ORAL | Status: DC | PRN
Start: 2015-08-27 — End: 2015-08-28
  Administered 2015-08-27 – 2015-08-28 (×4): 5 mg via ORAL
  Filled 2015-08-27 (×5): qty 1

## 2015-08-27 MED ORDER — SODIUM CHLORIDE 0.9 % IJ SOLN
3.0000 mL | INTRAMUSCULAR | Status: DC | PRN
  Administered 2015-08-27: 3 mL via INTRAVENOUS
  Filled 2015-08-27: qty 3

## 2015-08-27 NOTE — Progress Notes (Addendum)
Subjective: Hospital Day #1, Postpartum Day #9:  Readmit 08/26/15 for pp pre-eclampsia and HA.  S/P SVB, with BTL, small periurethral and vulvar tears. Patient up ad lib, reports no syncope or dizziness.  Denies visual sx or epigastric pain. Feeding:  Breast Contraceptive plan: BTL on 08/19/15  Still c/o moderate HA, with no benefit from Fioricet during night.  Had been taking Percocet at home with some benefit.  Feels magnesium making her feel "yucky"--received Zofran IV this am for nausea. Has ice pack on neck for stiffness/soreness.  Objective: Vital signs in last 24 hours: Temp:  [97.9 F (36.6 C)-98.2 F (36.8 C)] 98.2 F (36.8 C) (11/05 0805) Pulse Rate:  [63-94] 74 (11/05 0901) Resp:  [18-20] 18 (11/05 0901) BP: (78-139)/(60-92) 119/80 mmHg (11/05 0901) SpO2:  [94 %-100 %] 98 % (11/05 0805) Weight:  [104.327 kg (230 lb)] 104.327 kg (230 lb) (11/04 1615)   Filed Vitals:   08/27/15 0701 08/27/15 0800 08/27/15 0805 08/27/15 0901  BP: 120/78 126/89  119/80  Pulse: 77 74 80 74  Temp:   98.2 F (36.8 C)   TempSrc:   Oral   Resp: 18 18  18   Height:      Weight:      SpO2:   98%    Magnesium begun 12:55p yesterday.  Physical Exam:  General: alert Lochia: appropriate Uterine Fundus: firm Perineum: healing well DVT Evaluation: No evidence of DVT seen on physical exam. Negative Homan's sign.  DTR 2+, no clonus. Trace edema.  I/O balance since admission:  -734. Output overnight 1800 cc. Output this am 350 cc.  Weight on admission 229   CBC Latest Ref Rng 08/26/2015 08/19/2015 08/18/2015  WBC 4.0 - 10.5 K/uL 6.2 11.6(H) 7.0  Hemoglobin 12.0 - 15.0 g/dL 11.0(L) 9.9(L) 11.6(L)  Hematocrit 36.0 - 46.0 % 33.8(L) 29.2(L) 34.7(L)  Platelets 150 - 400 K/uL 241 157 183   CMP Latest Ref Rng 08/26/2015 02/05/2014 02/04/2014  Glucose 65 - 99 mg/dL 84 92 118(H)  BUN 6 - 20 mg/dL 10 13 9   Creatinine 0.44 - 1.00 mg/dL 0.79 0.91 0.76  Sodium 135 - 145 mmol/L 141 139 137  Potassium  3.5 - 5.1 mmol/L 3.4(L) 4.0 4.9  Chloride 101 - 111 mmol/L 111 102 102  CO2 22 - 32 mmol/L 25 28 25   Calcium 8.9 - 10.3 mg/dL 9.1 8.7 9.3  Total Protein 6.5 - 8.1 g/dL 6.0(L) 5.3(L) 5.6(L)  Total Bilirubin 0.3 - 1.2 mg/dL 0.2(L) <0.2(L) 0.3  Alkaline Phos 38 - 126 U/L 101 54 56  AST 15 - 41 U/L 25 28 37  ALT 14 - 54 U/L 32 42(H) 49(H)   Negative protein on UA.  Assessment/Plan: Status post vaginal delivery day #9 --readmission for pp pre-eclampsia Persistent HA. Stable Continue current care. Rx Oxycodone 5 mg po q 3 hours prn HA D/C magnesium at 12:55p today. Daily weights   Kelli Robeck, VICKICNM 08/27/2015, 9:57 AM   Pt seen and examined agree with above

## 2015-08-27 NOTE — Progress Notes (Addendum)
Checking on status of patient's HA--had consulted with Dr. Charlesetta Garibaldi, with consideration for IV pain med and Reglan if HA not improved.  Now 3 1/2 hours s/p d/c of magnesium--patient feeling much better, with HA down to 2/10 (from persistently 5/10 level).  Received Oxycodone 5 mg po at 1028 and 1412.  Will continue to observe at present--defer IV pain med and Reglan unless HA worsens.  Per Dr. Charlesetta Garibaldi, repeat [facility] labs in the am.  Donnel Saxon, CNM 08/27/15 4:30p  Addendum: HA now back to 5/10.  Consulted with Dr. Charlesetta Garibaldi. Reglan 10 mg IV now.  If no benefit, will plan head CT.  Donnel Saxon, CNM 08/27/15 5p  Addendum: Called unit to check on patient status--she is asleep. RN reports patient has slept little in the last 24 hours.  Will continue to observe at present.  Donnel Saxon, CNM 08/27/15 6:45p

## 2015-08-28 ENCOUNTER — Inpatient Hospital Stay (HOSPITAL_COMMUNITY): Payer: BC Managed Care – PPO

## 2015-08-28 DIAGNOSIS — R519 Headache, unspecified: Secondary | ICD-10-CM

## 2015-08-28 DIAGNOSIS — R51 Headache: Secondary | ICD-10-CM

## 2015-08-28 HISTORY — DX: Headache, unspecified: R51.9

## 2015-08-28 LAB — LACTATE DEHYDROGENASE: LDH: 206 U/L — AB (ref 98–192)

## 2015-08-28 LAB — CBC
HEMATOCRIT: 33.6 % — AB (ref 36.0–46.0)
Hemoglobin: 10.8 g/dL — ABNORMAL LOW (ref 12.0–15.0)
MCH: 26.5 pg (ref 26.0–34.0)
MCHC: 32.1 g/dL (ref 30.0–36.0)
MCV: 82.6 fL (ref 78.0–100.0)
Platelets: 246 10*3/uL (ref 150–400)
RBC: 4.07 MIL/uL (ref 3.87–5.11)
RDW: 14.5 % (ref 11.5–15.5)
WBC: 5.9 10*3/uL (ref 4.0–10.5)

## 2015-08-28 LAB — COMPREHENSIVE METABOLIC PANEL
ALBUMIN: 2.9 g/dL — AB (ref 3.5–5.0)
ALT: 22 U/L (ref 14–54)
ANION GAP: 6 (ref 5–15)
AST: 20 U/L (ref 15–41)
Alkaline Phosphatase: 97 U/L (ref 38–126)
BILIRUBIN TOTAL: 0.4 mg/dL (ref 0.3–1.2)
BUN: 11 mg/dL (ref 6–20)
CO2: 25 mmol/L (ref 22–32)
Calcium: 8.4 mg/dL — ABNORMAL LOW (ref 8.9–10.3)
Chloride: 109 mmol/L (ref 101–111)
Creatinine, Ser: 0.91 mg/dL (ref 0.44–1.00)
GFR calc Af Amer: >60 mL/min (ref >60)
GLUCOSE: 83 mg/dL (ref 65–99)
POTASSIUM: 3.9 mmol/L (ref 3.5–5.1)
Sodium: 140 mmol/L (ref 135–145)
TOTAL PROTEIN: 5.5 g/dL — AB (ref 6.5–8.1)

## 2015-08-28 LAB — URIC ACID: Uric Acid, Serum: 4.6 mg/dL (ref 2.3–6.6)

## 2015-08-28 MED ORDER — METOCLOPRAMIDE HCL 10 MG PO TABS: 10.0000 mg | ORAL_TABLET | Freq: Four times a day (QID) | ORAL | Status: DC | PRN

## 2015-08-28 MED ORDER — OXYCODONE HCL 5 MG PO TABS
5.0000 mg | ORAL_TABLET | ORAL | Status: DC | PRN
Start: 2015-08-28 — End: 2018-08-19

## 2015-08-28 MED ORDER — METOCLOPRAMIDE HCL 10 MG PO TABS
10.0000 mg | ORAL_TABLET | Freq: Four times a day (QID) | ORAL | Status: DC | PRN
  Administered 2015-08-28: 10 mg via ORAL
  Filled 2015-08-28: qty 1

## 2015-08-28 NOTE — Discharge Summary (Signed)
Physician Discharge Summary  Patient ID: Shannon [name] MRN: [id number] DOB/AGE: [birth year] 34 y.o.  Admit date: 08/26/2015 Discharge date: 08/28/2015  Admission Diagnoses: 8 days s/p SVB, pre-eclampsia, headache Discharge Diagnoses:  Principal Problem:   Pre-eclampsia in postpartum period Active Problems:   Latex allergy   Headache disorder   Discharged Condition: stable  Hospital Course: Admitted from office on 08/26/15 at 8 days s/p uncomplicated vaginal delivery with elevated BP, mildly elevated LFTs, and persistent HA.  Patient has had HA issues in past, but in light of pp status and other s/s, she was admitted for 24 hours of magnesium sulfate.  Patient had persistent HA, usually 5/10 intensity.  Given Ibuprophen, Oxycodone, Fioricet, with some benefit from Ibuprophen/Oxycodone combo, but minimal benefit from Fioricet.  On day 1 of hospitalization, IV Reglan was given with definite benefit.  On hospital day 2, patient was placed on Reglan 10 mg po q 6 hours prn, with improvement of HA to 2/10.  Head CT was WNL that day, with patient's vs stable.  She was d/c'd home per consult with Dr. Charlesetta Garibaldi.  Plan made for referral to neurologist for HA evaluation and management, and she will be seen in the office later this week for recheck of status.  She is to notify [facility] with any worsening of sx, including depression.  Consults: None  Significant Diagnostic Studies: labs:  Results for orders placed or performed during the hospital encounter of 08/26/15 (from the past 48 hour(s))  Comprehensive metabolic panel     Status: Abnormal   Collection Time: 08/26/15 12:50 PM  Result Value Ref Range   Sodium 141 135 - 145 mmol/L   Potassium 3.4 (L) 3.5 - 5.1 mmol/L   Chloride 111 101 - 111 mmol/L   CO2 25 22 - 32 mmol/L   Glucose, Bld 84 65 - 99 mg/dL   BUN 10 6 - 20 mg/dL   Creatinine, Ser 0.79 0.44 - 1.00 mg/dL   Calcium 9.1 8.9 - 10.3 mg/dL   Total Protein 6.0 (L) 6.5 - 8.1 g/dL   Albumin 3.1 (L)  3.5 - 5.0 g/dL   AST 25 15 - 41 U/L   ALT 32 14 - 54 U/L   Alkaline Phosphatase 101 38 - 126 U/L   Total Bilirubin 0.2 (L) 0.3 - 1.2 mg/dL   GFR calc non Af Amer >60 >60 mL/min   GFR calc Af Amer >60 >60 mL/min    Comment: (NOTE) The eGFR has been calculated using the CKD EPI equation. This calculation has not been validated in all clinical situations. eGFR's persistently <60 mL/min signify possible Chronic Kidney Disease.    Anion gap 5 5 - 15  CBC     Status: Abnormal   Collection Time: 08/26/15 12:50 PM  Result Value Ref Range   WBC 6.2 4.0 - 10.5 K/uL   RBC 4.13 3.87 - 5.11 MIL/uL   Hemoglobin 11.0 (L) 12.0 - 15.0 g/dL   HCT 33.8 (L) 36.0 - 46.0 %   MCV 81.8 78.0 - 100.0 fL   MCH 26.6 26.0 - 34.0 pg   MCHC 32.5 30.0 - 36.0 g/dL   RDW 14.1 11.5 - 15.5 %   Platelets 241 150 - 400 K/uL  Urinalysis, Routine w reflex microscopic (not at [facility])     Status: Abnormal   Collection Time: 08/26/15  1:50 PM  Result Value Ref Range   Color, Urine YELLOW YELLOW   APPearance CLEAR CLEAR   Specific Gravity, Urine 1.020 1.005 -  1.030   pH 7.0 5.0 - 8.0   Glucose, UA NEGATIVE NEGATIVE mg/dL   Hgb urine dipstick LARGE (A) NEGATIVE   Bilirubin Urine NEGATIVE NEGATIVE   Ketones, ur NEGATIVE NEGATIVE mg/dL   Protein, ur NEGATIVE NEGATIVE mg/dL   Urobilinogen, UA 0.2 0.0 - 1.0 mg/dL   Nitrite NEGATIVE NEGATIVE   Leukocytes, UA TRACE (A) NEGATIVE  Urine microscopic-add on     Status: Abnormal   Collection Time: 08/26/15  1:50 PM  Result Value Ref Range   Squamous Epithelial / LPF FEW (A) RARE   WBC, UA 0-2 <3 WBC/hpf   RBC / HPF 7-10 <3 RBC/hpf   Bacteria, UA MANY (A) RARE   Urine-Other MUCOUS PRESENT   CBC     Status: Abnormal   Collection Time: 08/28/15  5:40 AM  Result Value Ref Range   WBC 5.9 4.0 - 10.5 K/uL   RBC 4.07 3.87 - 5.11 MIL/uL   Hemoglobin 10.8 (L) 12.0 - 15.0 g/dL   HCT 33.6 (L) 36.0 - 46.0 %   MCV 82.6 78.0 - 100.0 fL   MCH 26.5 26.0 - 34.0 pg   MCHC 32.1 30.0 -  36.0 g/dL   RDW 14.5 11.5 - 15.5 %   Platelets 246 150 - 400 K/uL  Comprehensive metabolic panel     Status: Abnormal   Collection Time: 08/28/15  5:40 AM  Result Value Ref Range   Sodium 140 135 - 145 mmol/L   Potassium 3.9 3.5 - 5.1 mmol/L   Chloride 109 101 - 111 mmol/L   CO2 25 22 - 32 mmol/L   Glucose, Bld 83 65 - 99 mg/dL   BUN 11 6 - 20 mg/dL   Creatinine, Ser 0.91 0.44 - 1.00 mg/dL   Calcium 8.4 (L) 8.9 - 10.3 mg/dL   Total Protein 5.5 (L) 6.5 - 8.1 g/dL   Albumin 2.9 (L) 3.5 - 5.0 g/dL   AST 20 15 - 41 U/L   ALT 22 14 - 54 U/L   Alkaline Phosphatase 97 38 - 126 U/L   Total Bilirubin 0.4 0.3 - 1.2 mg/dL   GFR calc non Af Amer >60 >60 mL/min   GFR calc Af Amer >60 >60 mL/min    Comment: (NOTE) The eGFR has been calculated using the CKD EPI equation. This calculation has not been validated in all clinical situations. eGFR's persistently <60 mL/min signify possible Chronic Kidney Disease.    Anion gap 6 5 - 15  Lactate dehydrogenase     Status: Abnormal   Collection Time: 08/28/15  5:40 AM  Result Value Ref Range   LDH 206 (H) 98 - 192 U/L  Uric acid     Status: None   Collection Time: 08/28/15  5:40 AM  Result Value Ref Range   Uric Acid, Serum 4.6 2.3 - 6.6 mg/dL    and nuclear medicine:  Head CT WNL  Treatments: IV hydration and analgesia: Oxycodone; Other meds Ibuprophen and Reglan.  Discharge Exam: Blood pressure 121/83, pulse 83, temperature 98.6 F (37 C), temperature source Oral, resp. rate 18, height 5' 4"  (1.626 m), weight 104.809 kg (231 lb 1 oz), last menstrual period 11/14/2014, SpO2 97 %, unknown if currently breastfeeding.   Filed Vitals:   08/28/15 0500 08/28/15 0550 08/28/15 0846 08/28/15 0847  BP:  125/81  121/83  Pulse:  90 83   Temp:  98.2 F (36.8 C)  98.6 F (37 C)  TempSrc:  Oral  Oral  Resp:  16  18  Height:      Weight: 104.809 kg (231 lb 1 oz)     SpO2:  97% 97%    General appearance: alert Resp: clear to auscultation  bilaterally Breasts: normal appearance, no masses or tenderness, Lactating. Cardio: regular rate and rhythm, S1, S2 normal, no murmur, click, rub or gallop Pelvic: Uterus approx 14 week size, NT.  Lochia scant.  Perineum healing. Extremities: extremities normal, atraumatic, no cyanosis or edema  Disposition: 01-Home or Self Care     Medication List    TAKE these medications        albuterol 108 (90 BASE) MCG/ACT inhaler  Commonly known as:  PROVENTIL HFA;VENTOLIN HFA  Inhale 2 puffs into the lungs every 6 (six) hours as needed for wheezing or shortness of breath.     ibuprofen 600 MG tablet  Commonly known as:  ADVIL,MOTRIN  Take 1 tablet (600 mg total) by mouth every 6 (six) hours.     metoCLOPramide 10 MG tablet  Commonly known as:  REGLAN  Take 1 tablet (10 mg total) by mouth every 6 (six) hours as needed for nausea (Headache).     oxyCODONE 5 MG immediate release tablet  Commonly known as:  Oxy IR/ROXICODONE  Take 1 tablet (5 mg total) by mouth every 3 (three) hours as needed for moderate pain.     prenatal multivitamin Tabs tablet  Take 1 tablet by mouth daily at 12 noon.     Vitamin D 2000 UNITS Caps  Take 4,000 Units by mouth daily.           Follow-up Information    Follow up with [facility]. Schedule an appointment as soon as possible for a visit in 1 week.   Specialty:  Obstetrics and Gynecology   Why:  Office will call you to schedule follow-up visit this week.  Call for any questions or concerns.   Contact information:   [address] [place] [address] [phone]      Signed: Donnel Saxon 08/28/2015, 12:31 PM

## 2015-08-28 NOTE — Progress Notes (Addendum)
Subjective: Hospital Day #2, Postpartum Day #10: Readmit 08/26/15 for pp pre-eclampsia and HA. S/P SVB, with BTL, small periurethral and vulvar tears.  Received 24 hours of magnesium sulfate. HA still present, fluctuating from 2/10 to 5/10.  Reports "Reglan took it away completely", and patient was able to finally sleep, but HA did return to prior level later. Denies nausea, vomiting, visual sx, or epigastric pain. Patient up ad lib, reports no syncope or dizziness. Feeding:  Breast  Just took Ibuprophen and Oxycodone at 0852 HA 5/10 prior to med--still noted across top of head.  Denies neck pain or stiffness.  Objective: Vital signs in last 24 hours: Temp:  [97.5 F (36.4 C)-98.6 F (37 C)] 98.6 F (37 C) (11/06 0847) Pulse Rate:  [72-91] 83 (11/06 0846) Resp:  [16-18] 18 (11/06 0847) BP: (107-141)/(67-98) 121/83 mmHg (11/06 0847) SpO2:  [95 %-99 %] 97 % (11/06 0846) Weight:  [104.809 kg (231 lb 1 oz)-107.82 kg (237 lb 11.2 oz)] 104.809 kg (231 lb 1 oz) (11/06 0500)   Filed Vitals:   08/28/15 0500 08/28/15 0550 08/28/15 0846 08/28/15 0847  BP:  125/81  121/83  Pulse:  90 83   Temp:  98.2 F (36.8 C)  98.6 F (37 C)  TempSrc:  Oral  Oral  Resp:  16  18  Height:      Weight: 104.809 kg (231 lb 1 oz)     SpO2:  97% 97%     Physical Exam:  In NAD General: alert Lochia: appropriate Uterine Fundus: firm Perineum: healing well DVT Evaluation: No evidence of DVT seen on physical exam. Negative Homan's sign. Trace edema   CBC Latest Ref Rng 08/28/2015 08/26/2015 08/19/2015  WBC 4.0 - 10.5 K/uL 5.9 6.2 11.6(H)  Hemoglobin 12.0 - 15.0 g/dL 10.8(L) 11.0(L) 9.9(L)  Hematocrit 36.0 - 46.0 % 33.6(L) 33.8(L) 29.2(L)  Platelets 150 - 400 K/uL 246 241 157   CMP Latest Ref Rng 08/28/2015 08/26/2015 02/05/2014  Glucose 65 - 99 mg/dL 83 84 92  BUN 6 - 20 mg/dL 11 10 13   Creatinine 0.44 - 1.00 mg/dL 0.91 0.79 0.91  Sodium 135 - 145 mmol/L 140 141 139  Potassium 3.5 - 5.1 mmol/L 3.9  3.4(L) 4.0  Chloride 101 - 111 mmol/L 109 111 102  CO2 22 - 32 mmol/L 25 25 28   Calcium 8.9 - 10.3 mg/dL 8.4(L) 9.1 8.7  Total Protein 6.5 - 8.1 g/dL 5.5(L) 6.0(L) 5.3(L)  Total Bilirubin 0.3 - 1.2 mg/dL 0.4 0.2(L) <0.2(L)  Alkaline Phos 38 - 126 U/L 97 101 54  AST 15 - 41 U/L 20 25 28   ALT 14 - 54 U/L 22 32 42(H)   Uric acid 4.6 LDH 206  Assessment/Plan: Status post vaginal delivery day #10--Readmit 08/26/15 for pp pre-eclampsia and HA. S/P SVB, with BTL, small periurethral and vulvar tears Persistent HA Will try Reglan 10 mg po q 6 hours prn HA. Will consult with Dr. Charlesetta Garibaldi regarding possible CT imaging.  Shannon [name] 08/28/2015, 9:15 AM  Addendum: Per consult with Dr. Charlesetta Garibaldi, plan CT without contrast (OK per Radiology consult). If WNL, anticipate d/c today.  Shannon [name], CNM 08/28/15 9:55a  Addendum: CT scan WNL. Patient reports Reglan po "helped a lot"--HA at 2/10 now.  Consulted with Dr. Charlesetta Garibaldi. D/C patient home. Rx Oxycodone, Reglan, and patient is to continue Ibuprophen. Office will arrange referral to Neurologist for HA evaluation and management. Patient will be seen in f/u at the office later this week, with office contacting patient  to arrange. Patient will notify [facility] with any worsening of sx, including depression.  Shannon [name], CNM 08/28/15 12:40p

## 2015-08-28 NOTE — Discharge Instructions (Signed)
Preeclampsia and Eclampsia Preeclampsia is a serious condition that develops only during pregnancy. It is also called toxemia of pregnancy. This condition causes high blood pressure along with other symptoms, such as swelling and headaches. These may develop as the condition gets worse. Preeclampsia may occur 20 weeks or later into your pregnancy. It can also occur after delivery. Diagnosing and treating preeclampsia early is very important. If not treated early, it can cause serious problems for you and your baby. One problem it can lead to is eclampsia, which is a condition that causes muscle jerking or shaking (convulsions) in the mother. Delivering your baby is the best treatment for preeclampsia or eclampsia.  RISK FACTORS The cause of preeclampsia is not known. You may be more likely to develop preeclampsia if you have certain risk factors. These include:   Being pregnant for the first time.  Having preeclampsia in a past pregnancy.  Having a family history of preeclampsia.  Having high blood pressure.  Being pregnant with twins or triplets.  Being 86 or older.  Being African American.  Having kidney disease or diabetes.  Having medical conditions such as lupus or blood diseases.  Being very overweight (obese). SIGNS AND SYMPTOMS  The earliest signs of preeclampsia are:  High blood pressure.  Increased protein in your urine. Your health care provider will check for this at every prenatal visit. Other symptoms that can develop include:   Severe headaches.  Sudden weight gain.  Swelling of your hands, face, legs, and feet.  Feeling sick to your stomach (nauseous) and throwing up (vomiting).  Vision problems (blurred or double vision).  Numbness in your face, arms, legs, and feet.  Dizziness.  Slurred speech.  Sensitivity to bright lights.  Abdominal pain. DIAGNOSIS  There are no screening tests for preeclampsia. Your health care provider will ask you about  symptoms and check for signs of preeclampsia during your prenatal visits. You may also have tests, including:  Urine testing.  Blood testing. TREATMENT  You can work out the best treatment approach together with your health care provider. It is very important to keep all prenatal appointments. If you have an increased risk of preeclampsia, you may need more frequent prenatal exams.  Your health care provider may prescribe bed rest.  You may have to eat as little salt as possible.  You may need to take medicine to lower your blood pressure if the condition does not respond to more conservative measures.  You may need to stay in the hospital if your condition is severe. There, treatment will focus on controlling your blood pressure and fluid retention. You may also need to take medicine to prevent seizures. HOME CARE INSTRUCTIONS   Only take over-the-counter or prescription medicines as directed by your health care provider.  Lie on your left side while resting. This keeps pressure off your baby.  Elevate your feet while resting.  Get regular exercise. Ask your health care provider what type of exercise is safe for you.  Avoid caffeine and alcohol.  Do not smoke.  Drink 6-8 glasses of water every day.  Eat a balanced diet that is low in salt. Do not add salt to your food.  Avoid stressful situations as much as possible.  Get plenty of rest and sleep.  Keep all prenatal appointments and tests as scheduled. SEEK MEDICAL CARE IF:  You are gaining more weight than expected.  You have any headaches, abdominal pain, or nausea.  You are bruising more than usual.  You feel dizzy or light-headed. SEEK IMMEDIATE MEDICAL CARE IF:   You develop sudden or severe swelling anywhere in your body. This usually happens in the legs.  You gain 5 lb (2.3 kg) or more in a week.  You have a severe headache, dizziness, problems with your vision, or confusion.  You have severe abdominal  pain.  You have lasting nausea or vomiting.  You have a seizure.  You have trouble moving any part of your body.  You develop numbness in your body.  You have trouble speaking.  You have any abnormal bleeding.  You develop a stiff neck.  You pass out. MAKE SURE YOU:   Understand these instructions.  Will watch your condition.  Will get help right away if you are not doing well or get worse.   This information is not intended to replace advice given to you by your health care provider. Make sure you discuss any questions you have with your health care provider.   Document Released: 10/05/2000 Document Revised: 10/13/2013 Document Reviewed: 07/31/2013 Elsevier Interactive Patient Education 2016 [employer].   Postpartum Depression and Baby Blues The postpartum period begins right after the birth of a baby. During this time, there is often a great amount of joy and excitement. It is also a time of many changes in the life of the parents. Regardless of how many times a mother gives birth, each child brings new challenges and dynamics to the family. It is not unusual to have feelings of excitement along with confusing shifts in moods, emotions, and thoughts. All mothers are at risk of developing postpartum depression or the "baby blues." These mood changes can occur right after giving birth, or they may occur many months after giving birth. The baby blues or postpartum depression can be mild or severe. Additionally, postpartum depression can go away rather quickly, or it can be a long-term condition.  CAUSES Raised hormone levels and the rapid drop in those levels are thought to be a main cause of postpartum depression and the baby blues. A number of hormones change during and after pregnancy. Estrogen and progesterone usually decrease right after the delivery of your baby. The levels of thyroid hormone and various cortisol steroids also rapidly drop. Other factors that play a role in  these mood changes include major life events and genetics.  RISK FACTORS If you have any of the following risks for the baby blues or postpartum depression, know what symptoms to watch out for during the postpartum period. Risk factors that may increase the likelihood of getting the baby blues or postpartum depression include:  Having a personal or family history of depression.   Having depression while being pregnant.   Having premenstrual mood issues or mood issues related to oral contraceptives.  Having a lot of life stress.   Having marital conflict.   Lacking a social support network.   Having a baby with special needs.   Having health problems, such as diabetes.  SIGNS AND SYMPTOMS Symptoms of baby blues include:  Brief changes in mood, such as going from extreme happiness to sadness.  Decreased concentration.   Difficulty sleeping.   Crying spells, tearfulness.   Irritability.   Anxiety.  Symptoms of postpartum depression typically begin within the first month after giving birth. These symptoms include:  Difficulty sleeping or excessive sleepiness.   Marked weight loss.   Agitation.   Feelings of worthlessness.   Lack of interest in activity or food.  Postpartum psychosis is  a very serious condition and can be dangerous. Fortunately, it is rare. Displaying any of the following symptoms is cause for immediate medical attention. Symptoms of postpartum psychosis include:   Hallucinations and delusions.   Bizarre or disorganized behavior.   Confusion or disorientation.  DIAGNOSIS  A diagnosis is made by an evaluation of your symptoms. There are no medical or lab tests that lead to a diagnosis, but there are various questionnaires that a health care provider may use to identify those with the baby blues, postpartum depression, or psychosis. Often, a screening tool called the [place] Postnatal Depression Scale is used to diagnose depression  in the postpartum period.  TREATMENT The baby blues usually goes away on its own in 1-2 weeks. Social support is often all that is needed. You will be encouraged to get adequate sleep and rest. Occasionally, you may be given medicines to help you sleep.  Postpartum depression requires treatment because it can last several months or longer if it is not treated. Treatment may include individual or group therapy, medicine, or both to address any social, physiological, and psychological factors that may play a role in the depression. Regular exercise, a healthy diet, rest, and social support may also be strongly recommended.  Postpartum psychosis is more serious and needs treatment right away. Hospitalization is often needed. HOME CARE INSTRUCTIONS  Get as much rest as you can. Nap when the baby sleeps.   Exercise regularly. Some women find yoga and walking to be beneficial.   Eat a balanced and nourishing diet.   Do little things that you enjoy. Have a cup of tea, take a bubble bath, read your favorite magazine, or listen to your favorite music.  Avoid alcohol.   Ask for help with household chores, cooking, grocery shopping, or running errands as needed. Do not try to do everything.   Talk to people close to you about how you are feeling. Get support from your partner, family members, friends, or other new moms.  Try to stay positive in how you think. Think about the things you are grateful for.   Do not spend a lot of time alone.   Only take over-the-counter or prescription medicine as directed by your health care provider.  Keep all your postpartum appointments.   Let your health care provider know if you have any concerns.  SEEK MEDICAL CARE IF: You are having a reaction to or problems with your medicine. SEEK IMMEDIATE MEDICAL CARE IF:  You have suicidal feelings.   You think you may harm the baby or someone else. MAKE SURE YOU:  Understand these  instructions.  Will watch your condition.  Will get help right away if you are not doing well or get worse.   This information is not intended to replace advice given to you by your health care provider. Make sure you discuss any questions you have with your health care provider.   Document Released: 07/12/2004 Document Revised: 10/13/2013 Document Reviewed: 07/20/2013 Elsevier Interactive Patient Education 2016 [employer] Headache Without Cause A headache is pain or discomfort felt around the head or neck area. The specific cause of a headache may not be found. There are many causes and types of headaches. A few common ones are:  Tension headaches.  Migraine headaches.  Cluster headaches.  Chronic daily headaches. HOME CARE INSTRUCTIONS  Watch your condition for any changes. Take these steps to help with your condition: Managing Pain  Take over-the-counter and prescription medicines only as  told by your health care provider.  Lie down in a dark, quiet room when you have a headache.  If directed, apply ice to the head and neck area:  Put ice in a plastic bag.  Place a towel between your skin and the bag.  Leave the ice on for 20 minutes, 2-3 times per day.  Use a heating pad or hot shower to apply heat to the head and neck area as told by your health care provider.  Keep lights dim if bright lights bother you or make your headaches worse. Eating and Drinking  Eat meals on a regular schedule.  Limit alcohol use.  Decrease the amount of caffeine you drink, or stop drinking caffeine. General Instructions  Keep all follow-up visits as told by your health care provider. This is important.  Keep a headache journal to help find out what may trigger your headaches. For example, write down:  What you eat and drink.  How much sleep you get.  Any change to your diet or medicines.  Try massage or other relaxation techniques.  Limit stress.  Sit up  straight, and do not tense your muscles.  Do not use tobacco products, including cigarettes, chewing tobacco, or e-cigarettes. If you need help quitting, ask your health care provider.  Exercise regularly as told by your health care provider.  Sleep on a regular schedule. Get 7-9 hours of sleep, or the amount recommended by your health care provider. SEEK MEDICAL CARE IF:   Your symptoms are not helped by medicine.  You have a headache that is different from the usual headache.  You have nausea or you vomit.  You have a fever. SEEK IMMEDIATE MEDICAL CARE IF:   Your headache becomes severe.  You have repeated vomiting.  You have a stiff neck.  You have a loss of vision.  You have problems with speech.  You have pain in the eye or ear.  You have muscular weakness or loss of muscle control.  You lose your balance or have trouble walking.  You feel faint or pass out.  You have confusion.   This information is not intended to replace advice given to you by your health care provider. Make sure you discuss any questions you have with your health care provider.   Document Released: 10/08/2005 Document Revised: 06/29/2015 Document Reviewed: 01/31/2015 Elsevier Interactive Patient Education [employer].

## 2015-08-28 NOTE — Progress Notes (Signed)
Discharge instructions & prescription given. Pt discharged home with spouse & baby.

## 2015-09-22 ENCOUNTER — Ambulatory Visit: Payer: BC Managed Care – PPO | Admitting: Neurology

## 2016-01-23 ENCOUNTER — Other Ambulatory Visit: Payer: Self-pay | Admitting: General Surgery

## 2016-07-30 ENCOUNTER — Ambulatory Visit: Payer: Self-pay | Admitting: Orthopedic Surgery

## 2017-04-21 ENCOUNTER — Emergency Department (HOSPITAL_COMMUNITY)
Admission: EM | Admit: 2017-04-21 | Discharge: 2017-04-21 | Disposition: A | Discharge status: Home / Self Care | Payer: BC Managed Care – PPO | Attending: Emergency Medicine | Admitting: Emergency Medicine

## 2017-04-21 ENCOUNTER — Encounter (HOSPITAL_COMMUNITY): Payer: Self-pay | Admitting: Emergency Medicine

## 2017-04-21 DIAGNOSIS — I10 Essential (primary) hypertension: Secondary | ICD-10-CM | POA: Diagnosis not present

## 2017-04-21 DIAGNOSIS — Z79899 Other long term (current) drug therapy: Secondary | ICD-10-CM | POA: Diagnosis not present

## 2017-04-21 DIAGNOSIS — K1121 Acute sialoadenitis: Secondary | ICD-10-CM | POA: Diagnosis not present

## 2017-04-21 DIAGNOSIS — J45909 Unspecified asthma, uncomplicated: Secondary | ICD-10-CM | POA: Diagnosis not present

## 2017-04-21 DIAGNOSIS — Z91040 Latex allergy status: Secondary | ICD-10-CM | POA: Diagnosis not present

## 2017-04-21 DIAGNOSIS — G501 Atypical facial pain: Secondary | ICD-10-CM | POA: Diagnosis present

## 2017-04-21 MED ORDER — HYDROCODONE-ACETAMINOPHEN 5-325 MG PO TABS
1.0000 | ORAL_TABLET | Freq: Once | ORAL | Status: AC
  Administered 2017-04-21: 1 via ORAL
  Filled 2017-04-21: qty 1

## 2017-04-21 MED ORDER — CLINDAMYCIN HCL 300 MG PO CAPS: 300.0000 mg | ORAL_CAPSULE | Freq: Four times a day (QID) | ORAL | 0 refills | Status: DC

## 2017-04-21 MED ORDER — CLINDAMYCIN HCL 150 MG PO CAPS
300.0000 mg | ORAL_CAPSULE | Freq: Once | ORAL | Status: AC
  Administered 2017-04-21: 300 mg via ORAL
  Filled 2017-04-21: qty 2

## 2017-04-21 MED ORDER — HYDROCODONE-ACETAMINOPHEN 5-325 MG PO TABS: 1.0000 | ORAL_TABLET | Freq: Four times a day (QID) | ORAL | 0 refills | Status: DC | PRN

## 2017-04-21 MED ORDER — KETOROLAC TROMETHAMINE 60 MG/2ML IM SOLN
60.0000 mg | Freq: Once | INTRAMUSCULAR | Status: AC
  Administered 2017-04-21: 60 mg via INTRAMUSCULAR
  Filled 2017-04-21: qty 2

## 2017-04-21 MED ORDER — IBUPROFEN 800 MG PO TABS: 800.0000 mg | ORAL_TABLET | Freq: Three times a day (TID) | ORAL | 0 refills | Status: DC

## 2017-04-21 NOTE — ED Triage Notes (Signed)
Pt reports pain on left side of face around her eye with sensitivity to light on left eye.  Denies blurred vision.

## 2017-04-21 NOTE — ED Provider Notes (Signed)
[facility] DEPT Provider Note   CSN: [id number] Arrival date & time: 04/21/17  [place]     History   Chief Complaint Chief Complaint  Patient presents with  . Facial Pain    HPI Shannon [name] is a 36 y.o. female.  HPI  37 yo F here with an acute onset of facial pain a few days ago located in area of right angle of mandible. Has had some sleepiness for last few days but no other associated symptoms. Describes it as a dull achy pressure. No trouble swallowing and no pain on left side. Not had anything like this before. No fevers.   Past Medical History:  Diagnosis Date  . Asthma   . Hypertension    Preeclampsia post delivery  . Irregular heart beat     Patient Active Problem List   Diagnosis Date Noted  . Headache disorder 08/28/2015  . Pre-eclampsia in postpartum period 08/26/2015  . Susceptible to varicella (non-immune), currently pregnant 08/18/2015  . History of myomectomy 08/18/2015  . Latex allergy 08/18/2015  . Hypertensive disorder-resolved with weight loss 08/18/2015  . SVD (spontaneous vaginal delivery) 08/18/2015  . Depression 01/19/2013  . Vitamin D deficiency 11/21/2012  . OVERWEIGHT 03/25/2009  . BACK PAIN WITH RADICULOPATHY 03/23/2009  . ASTHMA, UNSPECIFIED, UNSPECIFIED STATUS 10/03/2008    Past Surgical History:  Procedure Laterality Date  . DILATATION & CURRETTAGE/HYSTEROSCOPY WITH RESECTOCOPE N/A 02/03/2014   Procedure: DILATATION & CURETTAGE/HYSTEROSCOPY ;  Surgeon: Delice Lesch, MD;  Location: [facility];  Service: Gynecology;  Laterality: N/A;  . MYOMECTOMY N/A 02/03/2014   Procedure: MYOMECTOMY ;  Surgeon: Delice Lesch, MD;  Location: [facility];  Service: Gynecology;  Laterality: N/A;  . tonsils and adnoids removed    . TUBAL LIGATION N/A 08/19/2015   Procedure: POST PARTUM TUBAL LIGATION;  Surgeon: Everett Graff, MD;  Location: [facility];  Service: Gynecology;  Laterality: N/A;    OB History    Gravida Para Term Preterm AB Living   3 2 2   1  2    SAB TAB Ectopic Multiple Live Births   1     0 1       Home Medications    Prior to Admission medications   Medication Sig Start Date End Date Taking? Authorizing Provider  albuterol (PROVENTIL HFA;VENTOLIN HFA) 108 (90 BASE) MCG/ACT inhaler Inhale 2 puffs into the lungs every 6 (six) hours as needed for wheezing or shortness of breath.    [provider]  Cholecalciferol (VITAMIN D) 2000 UNITS CAPS Take 4,000 Units by mouth daily.    [provider]  clindamycin (CLEOCIN) 300 MG capsule Take 1 capsule (300 mg total) by mouth 4 (four) times daily. X 7 days 04/21/17   Lera Gaines, Corene Cornea, MD  HYDROcodone-acetaminophen (NORCO) 5-325 MG tablet Take 1 tablet by mouth every 6 (six) hours as needed for severe pain. 04/21/17   Alcides Nutting, Corene Cornea, MD  ibuprofen (ADVIL,MOTRIN) 800 MG tablet Take 1 tablet (800 mg total) by mouth 3 (three) times daily. 04/21/17   Jansen Sciuto, Corene Cornea, MD  metoCLOPramide (REGLAN) 10 MG tablet Take 1 tablet (10 mg total) by mouth every 6 (six) hours as needed for nausea (Headache). 08/28/15   Donnel Saxon, CNM  oxyCODONE (OXY IR/ROXICODONE) 5 MG immediate release tablet Take 1 tablet (5 mg total) by mouth every 3 (three) hours as needed for moderate pain. 08/28/15   Donnel Saxon, CNM  Prenatal Vit-Fe Fumarate-FA (PRENATAL MULTIVITAMIN) TABS tablet Take 1 tablet by mouth daily at 12  noon.    [provider]    Family History Family History  Problem Relation Age of Onset  . Hypertension Mother   . Diabetes Mother   . Mental illness Mother   . Hypertension Father   . Cancer Father   . Asthma Brother   . Heart disease Maternal Grandmother   . Hypertension Maternal Grandmother   . Cancer Paternal Grandmother        breast cancer    Social History Social History  Substance Use Topics  . Smoking status: Never Smoker  . Smokeless tobacco: Never Used  . Alcohol use No     Comment: occasionally     Allergies   Pineapple; Ceftriaxone sodium; Diflucan  [fluconazole]; and Latex   Review of Systems Review of Systems  All other systems reviewed and are negative.    Physical Exam Updated Vital Signs BP 126/89   Pulse (!) 101   Temp 98.2 F (36.8 C) (Oral)   Resp 16   Ht 5\' 3"  (1.6 m)   Wt 83.9 kg (185 lb)   LMP 04/15/2017   SpO2 100%   BMI 32.77 kg/m   Physical Exam  Constitutional: She appears well-developed and well-nourished.  HENT:  Head: Normocephalic and atraumatic.  Mouth/Throat: Dental caries present.  Tender in posterior lateral mouth No e/o abscess or other acute findings Tenderness over parotid with mild swelling  Eyes: Conjunctivae and EOM are normal.  Neck: Normal range of motion.  Cardiovascular: Normal rate and regular rhythm.   Pulmonary/Chest: No stridor. No respiratory distress.  Abdominal: She exhibits no distension.  Neurological: She is alert.  Nursing note and vitals reviewed.    ED Treatments / Results  Labs (all labs ordered are listed, but only abnormal results are displayed) Labs Reviewed - No data to display  EKG  EKG Interpretation None       Radiology No results found.  Procedures Procedures (including critical care time)  Medications Ordered in ED Medications  clindamycin (CLEOCIN) capsule 300 mg (300 mg Oral Given 04/21/17 1926)  HYDROcodone-acetaminophen (NORCO/VICODIN) 5-325 MG per tablet 1 tablet (1 tablet Oral Given 04/21/17 1928)  ketorolac (TORADOL) injection 60 mg (60 mg Intramuscular Given 04/21/17 1930)     Initial Impression / Assessment and Plan / ED Course  I have reviewed the triage vital signs and the nursing notes.  Pertinent labs & imaging results that were available during my care of the patient were reviewed by me and considered in my medical decision making (see chart for details).    Suspect parotitis with uncertain etiology. Will treat symptoms, start antibiotics in case bacterial and have pcp follow up for further workup and management.   Final  Clinical Impressions(s) / ED Diagnoses   Final diagnoses:  Parotitis, acute    New Prescriptions New Prescriptions   CLINDAMYCIN (CLEOCIN) 300 MG CAPSULE    Take 1 capsule (300 mg total) by mouth 4 (four) times daily. X 7 days   HYDROCODONE-ACETAMINOPHEN (NORCO) 5-325 MG TABLET    Take 1 tablet by mouth every 6 (six) hours as needed for severe pain.   IBUPROFEN (ADVIL,MOTRIN) 800 MG TABLET    Take 1 tablet (800 mg total) by mouth 3 (three) times daily.     Merrily Pew, MD 04/21/17 [phone]

## 2017-07-12 ENCOUNTER — Other Ambulatory Visit: Payer: Self-pay | Admitting: Obstetrics and Gynecology

## 2018-08-19 ENCOUNTER — Encounter: Payer: Self-pay | Admitting: Internal Medicine

## 2018-08-19 ENCOUNTER — Ambulatory Visit (INDEPENDENT_AMBULATORY_CARE_PROVIDER_SITE_OTHER): Payer: BLUE CROSS/BLUE SHIELD | Admitting: Internal Medicine

## 2018-08-19 VITALS — BP 114/70 | HR 108 | Temp 98.4°F | Ht 63.0 in | Wt 244.6 lb

## 2018-08-19 DIAGNOSIS — R0683 Snoring: Secondary | ICD-10-CM | POA: Diagnosis not present

## 2018-08-19 DIAGNOSIS — R635 Abnormal weight gain: Secondary | ICD-10-CM

## 2018-08-19 DIAGNOSIS — E661 Drug-induced obesity: Secondary | ICD-10-CM

## 2018-08-19 DIAGNOSIS — E559 Vitamin D deficiency, unspecified: Secondary | ICD-10-CM | POA: Diagnosis not present

## 2018-08-19 DIAGNOSIS — Z6841 Body Mass Index (BMI) 40.0 and over, adult: Secondary | ICD-10-CM

## 2018-08-19 DIAGNOSIS — R000 Tachycardia, unspecified: Secondary | ICD-10-CM

## 2018-08-19 MED ORDER — PHENTERMINE HCL 15 MG PO CAPS: 15.0000 mg | ORAL_CAPSULE | ORAL | 1 refills | Status: DC

## 2018-08-19 MED ORDER — LIRAGLUTIDE -WEIGHT MANAGEMENT 18 MG/3ML ~~LOC~~ SOPN
3.0000 mg | PEN_INJECTOR | Freq: Every day | SUBCUTANEOUS | 1 refills | Status: DC
Start: 2018-08-19 — End: 2019-01-08

## 2018-08-20 LAB — BMP8+EGFR
BUN/Creatinine Ratio: 13 (ref 9–23)
BUN: 13 mg/dL (ref 6–20)
CALCIUM: 9.2 mg/dL (ref 8.7–10.2)
CO2: 25 mmol/L (ref 20–29)
Chloride: 102 mmol/L (ref 96–106)
Creatinine, Ser: 0.99 mg/dL (ref 0.57–1.00)
GFR, EST AFRICAN AMERICAN: 84 mL/min/{1.73_m2} (ref >59)
GFR, EST NON AFRICAN AMERICAN: 73 mL/min/{1.73_m2} (ref >59)
Glucose: 82 mg/dL (ref 65–99)
Potassium: 4.6 mmol/L (ref 3.5–5.2)
Sodium: 138 mmol/L (ref 134–144)

## 2018-08-20 LAB — CBC
Hematocrit: 37.4 % (ref 34.0–46.6)
Hemoglobin: 12.2 g/dL (ref 11.1–15.9)
MCH: 27.7 pg (ref 26.6–33.0)
MCHC: 32.6 g/dL (ref 31.5–35.7)
MCV: 85 fL (ref 79–97)
Platelets: 221 x10E3/uL (ref 150–450)
RBC: 4.41 x10E6/uL (ref 3.77–5.28)
RDW: 12.7 % (ref 12.3–15.4)
WBC: 7 x10E3/uL (ref 3.4–10.8)

## 2018-08-20 LAB — HEMOGLOBIN A1C
ESTIMATED AVERAGE GLUCOSE: 100 mg/dL
HEMOGLOBIN A1C: 5.1 % (ref 4.8–5.6)

## 2018-08-20 LAB — TSH: TSH: 1.35 u[IU]/mL (ref 0.450–4.500)

## 2018-08-20 LAB — VITAMIN D 25 HYDROXY (VIT D DEFICIENCY, FRACTURES): Vit D, 25-Hydroxy: 34 ng/mL (ref 30.0–100.0)

## 2018-08-20 NOTE — Progress Notes (Signed)
Here are your lab results:  Your blood count is normal. Your thyroid function is normal. Your kidney function is normal. Your vitamin D is low end of normal. I will send in prescription vitamin D. Please take as directed, and take ALL refills. You are not prediabetic.   Unfortunately, the study I discussed with you yesterday has closed.  You may start the phentermine. I look forward to seeing you at your next visit.   Lastly, I want to clarify something. Caleen Jobs mentioned you were not taking the Saxenda, I was under the impression you were. Let me know!   Sincerely,    Tamarion Haymond N. [facility], MD

## 2018-08-23 ENCOUNTER — Encounter: Payer: Self-pay | Admitting: Internal Medicine

## 2018-08-24 ENCOUNTER — Encounter: Payer: Self-pay | Admitting: Internal Medicine

## 2018-08-24 NOTE — Progress Notes (Signed)
Subjective:     Patient ID: Shannon [name] , female    DOB: [birth year] , 37 y.o.   MRN: [id number]   Chief Complaint  Patient presents with  . Obesity  . Medication Refill    HPI  SHE IS HERE TODAY FOR F/U WEIGHT GAIN. SHE ADMITS THAT SHE HAS NOT BEEN EXERCISING REGULARLY. SHE FEELS LIKE WEIGHT GAIN OCCURRED WITHIN PAST 2-3 MONTHS. SHE IS FRUSTRATED WITH HER LACK OF PROGRESS.     Past Medical History:  Diagnosis Date  . Asthma   . Hypertension    Preeclampsia post delivery  . Irregular heart beat      Family History  Problem Relation Age of Onset  . Hypertension Mother   . Diabetes Mother   . Mental illness Mother   . Hypertension Father   . Cancer Father   . Asthma Brother   . Heart disease Maternal Grandmother   . Hypertension Maternal Grandmother   . Cancer Paternal Grandmother        breast cancer     Current Outpatient Medications:  .  albuterol (PROVENTIL HFA;VENTOLIN HFA) 108 (90 BASE) MCG/ACT inhaler, Inhale 2 puffs into the lungs every 6 (six) hours as needed for wheezing or shortness of breath., Disp: , Rfl:  .  BIOTIN PO, biotin, Disp: , Rfl:  .  buPROPion (WELLBUTRIN XL) 300 MG 24 hr tablet, 1 tablet daily., Disp: , Rfl: 1 .  Cholecalciferol (VITAMIN D) 2000 UNITS CAPS, Take 4,000 Units by mouth daily., Disp: , Rfl:  .  doxepin (SINEQUAN) 25 MG capsule, 3 capsules Nightly., Disp: , Rfl: 1 .  DULoxetine (CYMBALTA) 60 MG capsule, 1 capsule daily., Disp: , Rfl: 1 .  Multiple Vitamins-Minerals (CENTRUM ADULTS PO), Take by mouth., Disp: , Rfl:  .  Liraglutide -Weight Management (SAXENDA) 18 MG/3ML SOPN, Inject 3 mg into the skin daily., Disp: 5 pen, Rfl: 1 .  phentermine 15 MG capsule, Take 1 capsule (15 mg total) by mouth every morning., Disp: 30 capsule, Rfl: 1   Allergies  Allergen Reactions  . Pineapple Shortness Of Breath  . Ceftriaxone Sodium Hives  . Diflucan [Fluconazole] Hives    Raw rash on abdomen and she had sores and swelling in mouth   .  Bactrim [Sulfamethoxazole-Trimethoprim] Hives  . Latex Rash     Review of Systems  Constitutional: Positive for unexpected weight change.  HENT: Negative.   Respiratory: Negative.   Cardiovascular: Negative.   Gastrointestinal: Negative.   Endocrine: Negative.   Genitourinary: Negative.   Neurological: Negative.   Psychiatric/Behavioral: Negative.      Today's Vitals   08/19/18 1531  BP: 114/70  Pulse: (!) 108  Temp: 98.4 F (36.9 C)  TempSrc: Oral  Weight: 244 lb 9.6 oz (110.9 kg)  Height: 5' 3"  (1.6 m)   Body mass index is 43.33 kg/m.   Objective:  Physical Exam  Constitutional: She is oriented to person, place, and time. She appears well-developed and well-nourished.  HENT:  Head: Normocephalic and atraumatic.  Eyes: EOM are normal.  Cardiovascular: Normal rate, regular rhythm and normal heart sounds.  SHE IS TACHYCARDIC.   Pulmonary/Chest: Effort normal and breath sounds normal.  Neurological: She is alert and oriented to person, place, and time.  Skin: Skin is warm and dry.  Psychiatric: She has a normal mood and affect.  Nursing note and vitals reviewed.       Assessment And Plan:     1. Abnormal weight gain  SHE IS ADVISED OF  34 POUND WEIGHT GAIN IN THE PAST SIX MONTHS.  SHE IS ENCOURAGED TO INCORPORATE NO LESS THAN 150 MINUTES PER WEEK OF REGULAR EXERCISE.  RX SAXENDA WAS REFILLED. I WILL ALSO CONSIDER REFERRAL TO [facility]. SHE WISHES TO THINK ABOUT THIS SOME MORE.   - CBC no Diff - TSH - BMP8+EGFR - Hemoglobin A1c  2. Sn[place]  I WILL REFER HER TO NEUROLOGY FOR SLEEP STUDY. SHE DOES ADMIT TO SOMETIMES HAVING H/A UPON AWAKENING.   3. Vitamin D deficiency disease  I WILL CHECK A VIT D LEVEL AND SUPPLEMENT AS NEEDED.  ALSO ENCOURAGED TO SPEND 15 MINUTES IN THE SUN DAILY.  - Vitamin D (25 hydroxy)  4. 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 SHE IS ENCOURAGED TO FOCUS ON 10% WEIGHT LOSS INITIALLY, AS THIS DOES GREATLY DECREASE RISK.   5. Tachycardia, unspecified  SHE IS ENCOURAGED TO INCREASE HER WATER INTAKE. SHE IS ALSO ENCOURAGED TO TAKE MG SUPPLEMENTS NIGHTLY.    Maximino Greenland, MD

## 2018-09-08 ENCOUNTER — Encounter: Payer: Self-pay | Admitting: Internal Medicine

## 2018-09-09 ENCOUNTER — Other Ambulatory Visit: Payer: Self-pay

## 2018-09-09 MED ORDER — VITAMIN D (ERGOCALCIFEROL) 1.25 MG (50000 UNIT) PO CAPS: ORAL_CAPSULE | ORAL | 1 refills | Status: DC

## 2018-09-11 ENCOUNTER — Encounter: Payer: Self-pay | Admitting: Internal Medicine

## 2018-09-30 ENCOUNTER — Other Ambulatory Visit: Payer: Self-pay | Admitting: Internal Medicine

## 2018-10-07 ENCOUNTER — Emergency Department (HOSPITAL_COMMUNITY)
Admission: EM | Admit: 2018-10-07 | Discharge: 2018-10-07 | Disposition: A | Discharge status: Home / Self Care | Payer: BC Managed Care – PPO | Attending: Emergency Medicine | Admitting: Emergency Medicine

## 2018-10-07 ENCOUNTER — Encounter (HOSPITAL_COMMUNITY): Payer: Self-pay | Admitting: Emergency Medicine

## 2018-10-07 ENCOUNTER — Encounter: Payer: Self-pay | Admitting: Internal Medicine

## 2018-10-07 ENCOUNTER — Other Ambulatory Visit: Payer: Self-pay

## 2018-10-07 ENCOUNTER — Emergency Department (HOSPITAL_COMMUNITY): Payer: BC Managed Care – PPO

## 2018-10-07 ENCOUNTER — Ambulatory Visit: Payer: BLUE CROSS/BLUE SHIELD | Admitting: Internal Medicine

## 2018-10-07 DIAGNOSIS — Z91040 Latex allergy status: Secondary | ICD-10-CM | POA: Insufficient documentation

## 2018-10-07 DIAGNOSIS — Z79899 Other long term (current) drug therapy: Secondary | ICD-10-CM | POA: Insufficient documentation

## 2018-10-07 DIAGNOSIS — R109 Unspecified abdominal pain: Secondary | ICD-10-CM

## 2018-10-07 DIAGNOSIS — J45909 Unspecified asthma, uncomplicated: Secondary | ICD-10-CM | POA: Diagnosis not present

## 2018-10-07 DIAGNOSIS — R1011 Right upper quadrant pain: Secondary | ICD-10-CM | POA: Insufficient documentation

## 2018-10-07 DIAGNOSIS — I10 Essential (primary) hypertension: Secondary | ICD-10-CM | POA: Diagnosis not present

## 2018-10-07 LAB — URINALYSIS, ROUTINE W REFLEX MICROSCOPIC
Bacteria, UA: NONE SEEN
Bilirubin Urine: NEGATIVE
Glucose, UA: NEGATIVE mg/dL
Ketones, ur: NEGATIVE mg/dL
Nitrite: NEGATIVE
Protein, ur: NEGATIVE mg/dL
Specific Gravity, Urine: 1.025 (ref 1.005–1.030)
pH: 6 (ref 5.0–8.0)

## 2018-10-07 LAB — COMPREHENSIVE METABOLIC PANEL
ALK PHOS: 79 U/L (ref 38–126)
ALT: 22 U/L (ref 0–44)
AST: 20 U/L (ref 15–41)
Albumin: 3.7 g/dL (ref 3.5–5.0)
Anion gap: 6 (ref 5–15)
BILIRUBIN TOTAL: 0.4 mg/dL (ref 0.3–1.2)
BUN: 12 mg/dL (ref 6–20)
CALCIUM: 9.3 mg/dL (ref 8.9–10.3)
CO2: 26 mmol/L (ref 22–32)
Chloride: 108 mmol/L (ref 98–111)
Creatinine, Ser: 1.02 mg/dL — ABNORMAL HIGH (ref 0.44–1.00)
GFR calc Af Amer: >60 mL/min (ref >60)
Glucose, Bld: 87 mg/dL (ref 70–99)
Potassium: 4.4 mmol/L (ref 3.5–5.1)
Sodium: 140 mmol/L (ref 135–145)
Total Protein: 6.9 g/dL (ref 6.5–8.1)

## 2018-10-07 LAB — CBC
HCT: 41.9 % (ref 36.0–46.0)
Hemoglobin: 13.2 g/dL (ref 12.0–15.0)
MCH: 27.4 pg (ref 26.0–34.0)
MCHC: 31.5 g/dL (ref 30.0–36.0)
MCV: 87.1 fL (ref 80.0–100.0)
PLATELETS: 246 10*3/uL (ref 150–400)
RBC: 4.81 MIL/uL (ref 3.87–5.11)
RDW: 12.6 % (ref 11.5–15.5)
WBC: 7 10*3/uL (ref 4.0–10.5)
nRBC: 0 % (ref 0.0–0.2)

## 2018-10-07 LAB — LIPASE, BLOOD: Lipase: 36 U/L (ref 11–51)

## 2018-10-07 MED ORDER — ONDANSETRON HCL 4 MG/2ML IJ SOLN
4.0000 mg | Freq: Once | INTRAMUSCULAR | Status: AC | PRN
  Administered 2018-10-07: 4 mg via INTRAVENOUS
  Filled 2018-10-07: qty 2

## 2018-10-07 MED ORDER — ONDANSETRON HCL 4 MG/2ML IJ SOLN
4.0000 mg | Freq: Once | INTRAMUSCULAR | Status: AC
  Administered 2018-10-07: 4 mg via INTRAVENOUS
  Filled 2018-10-07: qty 2

## 2018-10-07 MED ORDER — ONDANSETRON 4 MG PO TBDP: 4.0000 mg | ORAL_TABLET | Freq: Three times a day (TID) | ORAL | 0 refills | Status: DC | PRN

## 2018-10-07 MED ORDER — HYOSCYAMINE SULFATE SL 0.125 MG SL SUBL: 0.1250 mg | SUBLINGUAL_TABLET | Freq: Four times a day (QID) | SUBLINGUAL | 0 refills | Status: DC | PRN

## 2018-10-07 MED ORDER — MORPHINE SULFATE (PF) 4 MG/ML IV SOLN
4.0000 mg | Freq: Once | INTRAVENOUS | Status: AC
  Administered 2018-10-07: 4 mg via INTRAVENOUS
  Filled 2018-10-07: qty 1

## 2018-10-07 MED ORDER — SODIUM CHLORIDE 0.9 % IV BOLUS
1000.0000 mL | Freq: Once | INTRAVENOUS | Status: AC
  Administered 2018-10-07: 1000 mL via INTRAVENOUS

## 2018-10-07 NOTE — Discharge Instructions (Addendum)
Blood work and ultrasound were good.  Prescription for nausea medicine and medication for abdominal cramping.  Follow-up with your primary care doctor.

## 2018-10-07 NOTE — ED Triage Notes (Signed)
Pt c/o of epigastric pain with n/v x 3 months.

## 2018-10-09 NOTE — ED Provider Notes (Signed)
[facility] EMERGENCY DEPARTMENT Provider Note   CSN: [id number] Arrival date & time: 10/07/18  1254     History   Chief Complaint Chief Complaint  Patient presents with  . Emesis    HPI Shannon [name] is a 37 y.o. female.  Epigastric pain with associated nausea and vomiting since 7 AM today.  Similar symptoms have evolved over 3 months.  No fever, sweats, chills, jaundice pain, weight loss.  Past medical history includes asthma and hypertension.  Severity is mild to moderate.  Nothing makes symptoms better or worse.     Past Medical History:  Diagnosis Date  . Asthma   . Hypertension    Preeclampsia post delivery  . Irregular heart beat     Patient Active Problem List   Diagnosis Date Noted  . Headache disorder 08/28/2015  . Pre-eclampsia in postpartum period 08/26/2015  . Susceptible to varicella (non-immune), currently pregnant 08/18/2015  . History of myomectomy 08/18/2015  . Latex allergy 08/18/2015  . Hypertensive disorder-resolved with weight loss 08/18/2015  . SVD (spontaneous vaginal delivery) 08/18/2015  . Depression 01/19/2013  . Vitamin D deficiency 11/21/2012  . OVERWEIGHT 03/25/2009  . BACK PAIN WITH RADICULOPATHY 03/23/2009  . ASTHMA, UNSPECIFIED, UNSPECIFIED STATUS 10/03/2008    Past Surgical History:  Procedure Laterality Date  . DILATATION & CURRETTAGE/HYSTEROSCOPY WITH RESECTOCOPE N/A 02/03/2014   Procedure: DILATATION & CURETTAGE/HYSTEROSCOPY ;  Surgeon: Delice Lesch, MD;  Location: [facility];  Service: Gynecology;  Laterality: N/A;  . MYOMECTOMY N/A 02/03/2014   Procedure: MYOMECTOMY ;  Surgeon: Delice Lesch, MD;  Location: [facility];  Service: Gynecology;  Laterality: N/A;  . tonsils and adnoids removed    . TUBAL LIGATION N/A 08/19/2015   Procedure: POST PARTUM TUBAL LIGATION;  Surgeon: Everett Graff, MD;  Location: [facility];  Service: Gynecology;  Laterality: N/A;     OB History    Gravida  3   Para  2   Term  2   Preterm      AB  1   Living  2     SAB  1   TAB      Ectopic      Multiple  0   Live Births  1            Home Medications    Prior to Admission medications   Medication Sig Start Date End Date Taking? Authorizing Provider  BIOTIN PO Take 10,000 Units by mouth daily.    Yes [provider]  buPROPion (WELLBUTRIN XL) 300 MG 24 hr tablet Take 1 tablet by mouth daily.  07/18/18  Yes [provider]  Cholecalciferol (VITAMIN D) 2000 UNITS CAPS Take 5,000 Units by mouth daily.   Yes [provider]  doxepin (SINEQUAN) 25 MG capsule Take 3 capsules by mouth at bedtime.  07/18/18  Yes [provider]  DULoxetine (CYMBALTA) 60 MG capsule Take 1 capsule by mouth daily.  06/23/18  Yes [provider]  Flaxseed, Linseed, (FLAXSEED OIL) 1000 MG CAPS Take 1 capsule by mouth daily.    Yes [provider]  Liraglutide -Weight Management (SAXENDA) 18 MG/3ML SOPN Inject 3 mg into the skin daily. 08/19/18  Yes Glendale Chard, MD  magnesium oxide (MAG-OX) 400 MG tablet TAKE 1 TABLET BY MOUTH ONCE DAILY 10/01/18  Yes Glendale Chard, MD  Multiple Vitamins-Minerals (CENTRUM ADULTS PO) Take 1 tablet by mouth daily.    Yes [provider]  Omega 3-6-9 Fatty Acids (OMEGA-3-6-9 PO) Take  1 capsule by mouth daily.    Yes [provider]  phentermine 15 MG capsule Take 1 capsule (15 mg total) by mouth every morning. 08/19/18  Yes Glendale Chard, MD  albuterol (PROVENTIL HFA;VENTOLIN HFA) 108 (90 BASE) MCG/ACT inhaler Inhale 2 puffs into the lungs every 6 (six) hours as needed for wheezing or shortness of breath.    [provider]  Hyoscyamine Sulfate SL (LEVSIN/SL) 0.125 MG SUBL Place 0.125 mg under the tongue 4 (four) times daily as needed. 10/07/18   Nat Christen, MD  ondansetron (ZOFRAN ODT) 4 MG disintegrating tablet Take 1 tablet (4 mg total) by mouth every 8 (eight) hours as needed for nausea or vomiting. 10/07/18   Nat Christen, MD    Vitamin D, Ergocalciferol, (DRISDOL) 1.25 MG (50000 UT) CAPS capsule Take 1 capsule by mouth on Tuesdays and Fridays 09/09/18   Glendale Chard, MD    Family History Family History  Problem Relation Age of Onset  . Hypertension Mother   . Diabetes Mother   . Mental illness Mother   . Hypertension Father   . Cancer Father   . Asthma Brother   . Heart disease Maternal Grandmother   . Hypertension Maternal Grandmother   . Cancer Paternal Grandmother        breast cancer    Social History Social History   Tobacco Use  . Smoking status: Never Smoker  . Smokeless tobacco: Never Used  Substance Use Topics  . Alcohol use: No    Comment: occasionally  . Drug use: No     Allergies   Diclofenac; Pineapple; Ceftriaxone sodium; Diflucan [fluconazole]; Eggs or egg-derived products; Bactrim [sulfamethoxazole-trimethoprim]; and Latex   Review of Systems Review of Systems  All other systems reviewed and are negative.    Physical Exam Updated Vital Signs BP 112/76   Pulse 96   Temp 97.8 F (36.6 C) (Temporal)   Resp 14   Ht 5\' 4"  (1.626 m)   Wt 89.8 kg   SpO2 99%   BMI 33.99 kg/m   Physical Exam Vitals signs and nursing note reviewed.  Constitutional:      Appearance: She is well-developed.  HENT:     Head: Normocephalic and atraumatic.  Eyes:     Conjunctiva/sclera: Conjunctivae normal.  Neck:     Musculoskeletal: Neck supple.  Cardiovascular:     Rate and Rhythm: Normal rate and regular rhythm.  Pulmonary:     Effort: Pulmonary effort is normal.     Breath sounds: Normal breath sounds.  Abdominal:     General: Bowel sounds are normal.     Palpations: Abdomen is soft.     Comments: Epigastric tenderness  Musculoskeletal: Normal range of motion.  Skin:    General: Skin is warm and dry.  Neurological:     Mental Status: She is alert and oriented to person, place, and time.  Psychiatric:        Behavior: Behavior normal.      ED Treatments / Results   Labs (all labs ordered are listed, but only abnormal results are displayed) Labs Reviewed  COMPREHENSIVE METABOLIC PANEL - Abnormal; Notable for the following components:      Result Value   Creatinine, Ser 1.02 (*)    All other components within normal limits  URINALYSIS, ROUTINE W REFLEX MICROSCOPIC - Abnormal; Notable for the following components:   APPearance HAZY (*)    Hgb urine dipstick SMALL (*)    Leukocytes, UA SMALL (*)  All other components within normal limits  LIPASE, BLOOD  CBC    EKG None  Radiology US Abdomen Limited Ruq  Result Date: 10/07/2018 CLINICAL DATA:  Upper abdominal pain EXAM: ULTRASOUND ABDOMEN LIMITED RIGHT UPPER QUADRANT COMPARISON:  CT abdomen and pelvis December 23, 2013 FINDINGS: Gallbladder: No gallstones or wall thickening visualized. There is no pericholecystic fluid. No sonographic Murphy sign noted by sonographer. Common bile duct: Diameter: 3 mm. No intrahepatic or extrahepatic biliary duct dilatation. Liver: No focal lesion identified. Within normal limits in parenchymal echogenicity. Portal vein is patent on color Doppler imaging with normal direction of blood flow towards the liver. IMPRESSION: Study within normal limits. Electronically Signed   By: Lowella Grip III M.D.   On: 10/07/2018 15:05    Procedures Procedures (including critical care time)  Medications Ordered in ED Medications  ondansetron (ZOFRAN) injection 4 mg (4 mg Intravenous Given 10/07/18 1331)  ondansetron (ZOFRAN) injection 4 mg (4 mg Intravenous Given 10/07/18 1459)  sodium chloride 0.9 % bolus 1,000 mL (0 mLs Intravenous Stopped 10/07/18 1704)  morphine 4 MG/ML injection 4 mg (4 mg Intravenous Given 10/07/18 1459)     Initial Impression / Assessment and Plan / ED Course  I have reviewed the triage vital signs and the nursing notes.  Pertinent labs & imaging results that were available during my care of the patient were reviewed by me and considered in my  medical decision making (see chart for details).     Patient presents with epigastric discomfort.  White count, liver functions, lipase all normal.  Ultrasound showed no obvious gallbladder or pancreas issues.  Discharge medications Zofran 4 mg and Levsin 0.125 mg.  Final Clinical Impressions(s) / ED Diagnoses   Final diagnoses:  RUQ abdominal pain  Abdominal pain, unspecified abdominal location    ED Discharge Orders         Ordered    ondansetron (ZOFRAN ODT) 4 MG disintegrating tablet  Every 8 hours PRN     10/07/18 1739    Hyoscyamine Sulfate SL (LEVSIN/SL) 0.125 MG SUBL  4 times daily PRN     10/07/18 1739           Nat Christen, MD 10/09/18 1041

## 2018-11-03 ENCOUNTER — Other Ambulatory Visit: Payer: Self-pay | Admitting: Internal Medicine

## 2018-11-03 NOTE — Telephone Encounter (Signed)
Phentermine refill. 

## 2018-11-10 ENCOUNTER — Encounter: Payer: Self-pay | Admitting: Internal Medicine

## 2018-11-13 ENCOUNTER — Encounter: Payer: Self-pay | Admitting: Neurology

## 2018-11-13 ENCOUNTER — Ambulatory Visit (INDEPENDENT_AMBULATORY_CARE_PROVIDER_SITE_OTHER): Payer: BC Managed Care – PPO | Admitting: Neurology

## 2018-11-13 VITALS — BP 152/101 | HR 101 | Ht 64.0 in | Wt 245.0 lb

## 2018-11-13 DIAGNOSIS — G4719 Other hypersomnia: Secondary | ICD-10-CM | POA: Diagnosis not present

## 2018-11-13 DIAGNOSIS — J4520 Mild intermittent asthma, uncomplicated: Secondary | ICD-10-CM

## 2018-11-13 DIAGNOSIS — Z820 Family history of epilepsy and other diseases of the nervous system: Secondary | ICD-10-CM

## 2018-11-13 DIAGNOSIS — R0683 Snoring: Secondary | ICD-10-CM | POA: Diagnosis not present

## 2018-11-13 DIAGNOSIS — R519 Headache, unspecified: Secondary | ICD-10-CM

## 2018-11-13 DIAGNOSIS — R51 Headache: Secondary | ICD-10-CM | POA: Diagnosis not present

## 2018-11-13 DIAGNOSIS — R0681 Apnea, not elsewhere classified: Secondary | ICD-10-CM

## 2018-11-13 DIAGNOSIS — Z6841 Body Mass Index (BMI) 40.0 and over, adult: Secondary | ICD-10-CM

## 2018-11-13 DIAGNOSIS — G478 Other sleep disorders: Secondary | ICD-10-CM

## 2018-11-13 NOTE — Progress Notes (Signed)
Subjective:    Patient ID: Shannon [name] is a 38 y.o. female.  HPI     Star Age, MD, PhD [facility] [address] [address] [place], [place] [address]  Dear Dr. Baird Cancer,  I saw your patient, Shannon [name], upon your kind request in my sleep clinic today for initial consultation of her sleep disorder, in particular, concern for underlying obstructive sleep apnea. The patient is unaccompanied today. As you know, Shannon [name] is a 38 year old right-handed woman with an underlying medical history of asthma, anxiety, depression, vitamin D deficiency and morbid obesity with a BMI of over 40, who reports snoring and excessive daytime somnolence. I reviewed your office note from 08/19/2018.  Her Epworth sleepiness score is 12 out of 24 today, fatigue score is 57 out of 63. She is married and lives with her husband, she has 2 children, ages 92 and 108, and one stepchild, age 70. She works for [employer] middle school. She is a nonsmoker and does not utilize alcohol and drinks caffeine not daily. Her mother has sleep apnea and maternal aunt also has sleep apnea, both used CPAP machines. Patient would be willing to consider CPAP or AutoPap. She had a tonsillectomy as an adult and had an adenoidectomy as a child. She had trouble sleeping as a child. She still has some trouble going to sleep or maintaining sleep. She tried doxepin for sleep but gained weight. Bedtime is between 9:30 and 10, rise time around 6. She denies diet tonight nocturia but has had morning headaches. She has been told that she pauses in her breathing while asleep per husband's report. She has a TV in the bedroom but does not utilize it at night. She has a history of sleepwalking as a child and sleep talking and still has some sleep talking. She was diagnosed with asthma as a child.  Her Past Medical History Is Significant For: Past Medical History:  Diagnosis Date  . Asthma   . Hypertension     Preeclampsia post delivery  . Irregular heart beat     Her Past Surgical History Is Significant For: Past Surgical History:  Procedure Laterality Date  . DILATATION & CURRETTAGE/HYSTEROSCOPY WITH RESECTOCOPE N/A 02/03/2014   Procedure: DILATATION & CURETTAGE/HYSTEROSCOPY ;  Surgeon: Delice Lesch, MD;  Location: [facility];  Service: Gynecology;  Laterality: N/A;  . MYOMECTOMY N/A 02/03/2014   Procedure: MYOMECTOMY ;  Surgeon: Delice Lesch, MD;  Location: [facility];  Service: Gynecology;  Laterality: N/A;  . tonsils and adnoids removed    . TUBAL LIGATION N/A 08/19/2015   Procedure: POST PARTUM TUBAL LIGATION;  Surgeon: Everett Graff, MD;  Location: [facility];  Service: Gynecology;  Laterality: N/A;    Her Family History Is Significant For: Family History  Problem Relation Age of Onset  . Hypertension Mother   . Diabetes Mother   . Mental illness Mother   . Hypertension Father   . Cancer Father   . Asthma Brother   . Heart disease Maternal Grandmother   . Hypertension Maternal Grandmother   . Cancer Paternal Grandmother        breast cancer    Her Social History Is Significant For: Social History   Socioeconomic History  . Marital status: Married    Spouse name: Not on file  . Number of children: Not on file  . Years of education: Not on file  . Highest education level: Not on file  Occupational History  . Not  on file  Social Needs  . Financial resource strain: Not on file  . Food insecurity:    Worry: Not on file    Inability: Not on file  . Transportation needs:    Medical: Not on file    Non-medical: Not on file  Tobacco Use  . Smoking status: Never Smoker  . Smokeless tobacco: Never Used  Substance and Sexual Activity  . Alcohol use: No    Comment: occasionally  . Drug use: No  . Sexual activity: Yes    Birth control/protection: None  Lifestyle  . Physical activity:    Days per week: Not on file    Minutes per session: Not on file  . Stress: Not on file   Relationships  . Social connections:    Talks on phone: Not on file    Gets together: Not on file    Attends religious service: Not on file    Active member of club or organization: Not on file    Attends meetings of clubs or organizations: Not on file    Relationship status: Not on file  Other Topics Concern  . Not on file  Social History Narrative  . Not on file    Her Allergies Are:  Allergies  Allergen Reactions  . Diclofenac Rash  . Pineapple Shortness Of Breath  . Ceftriaxone Sodium Hives  . Diflucan [Fluconazole] Hives    Raw rash on abdomen and she had sores and swelling in mouth   . Eggs Or Egg-Derived Products Nausea And Vomiting    Can eat cake made with eggs  . Bactrim [Sulfamethoxazole-Trimethoprim] Hives  . Latex Rash  :   Her Current Medications Are:  Outpatient Encounter Medications as of 11/13/2018  Medication Sig  . albuterol (PROVENTIL HFA;VENTOLIN HFA) 108 (90 BASE) MCG/ACT inhaler Inhale 2 puffs into the lungs every 6 (six) hours as needed for wheezing or shortness of breath.  Marland Kitchen BIOTIN PO Take 10,000 Units by mouth daily.   Marland Kitchen buPROPion (WELLBUTRIN XL) 300 MG 24 hr tablet Take 1 tablet by mouth daily.   . Cholecalciferol (VITAMIN D) 2000 UNITS CAPS Take 5,000 Units by mouth daily.  . DULoxetine (CYMBALTA) 60 MG capsule Take 1 capsule by mouth daily.   . Flaxseed, Linseed, (FLAXSEED OIL) 1000 MG CAPS Take 1 capsule by mouth daily.   Marland Kitchen Hyoscyamine Sulfate SL (LEVSIN/SL) 0.125 MG SUBL Place 0.125 mg under the tongue 4 (four) times daily as needed.  . Liraglutide -Weight Management (SAXENDA) 18 MG/3ML SOPN Inject 3 mg into the skin daily.  . magnesium oxide (MAG-OX) 400 MG tablet TAKE 1 TABLET BY MOUTH ONCE DAILY  . Multiple Vitamins-Minerals (CENTRUM ADULTS PO) Take 1 tablet by mouth daily.   Ernestine Conrad 3-6-9 Fatty Acids (OMEGA-3-6-9 PO) Take 1 capsule by mouth daily.   . ondansetron (ZOFRAN ODT) 4 MG disintegrating tablet Take 1 tablet (4 mg total) by mouth  every 8 (eight) hours as needed for nausea or vomiting.  . phentermine 15 MG capsule TAKE 1 CAPSULE BY MOUTH ONCE DAILY IN THE MORNING  . Vitamin D, Ergocalciferol, (DRISDOL) 1.25 MG (50000 UT) CAPS capsule Take 1 capsule by mouth on Tuesdays and Fridays  . [DISCONTINUED] doxepin (SINEQUAN) 25 MG capsule Take 3 capsules by mouth at bedtime.    No facility-administered encounter medications on file as of 11/13/2018.   :  Review of Systems:  Out of a complete 14 point review of systems, all are reviewed and negative with the exception of  these symptoms as listed below: Review of Systems  Neurological:       Pt presents today to discuss her sleep. Pt has never had a sleep study but does endorse snoring.  Epworth Sleepiness Scale 0= would never doze 1= slight chance of dozing 2= moderate chance of dozing 3= high chance of dozing  Sitting and reading: 3 Watching TV: 3 Sitting inactive in a public place (ex. Theater or meeting): 0 As a passenger in a car for an hour without a break: 2 Lying down to rest in the afternoon: 2 Sitting and talking to someone: 0 Sitting quietly after lunch (no alcohol): 2 In a car, while stopped in traffic: 0 Total: 12     Objective:  Neurological Exam  Physical Exam Physical Examination:   Vitals:   11/13/18 1323  BP: (!) 152/101  Pulse: (!) 101    General Examination: The patient is a very pleasant 39 y.o. female in no acute distress. She appears well-developed and well-nourished and well groomed.   HEENT: Normocephalic, atraumatic, pupils are equal, round and reactive to light and accommodation. Extraocular tracking is good without limitation to gaze excursion or nystagmus noted. Normal smooth pursuit is noted. Hearing is grossly intact. Face is symmetric with normal facial animation and normal facial sensation. Speech is clear with no dysarthria noted. There is no hypophonia. There is no lip, neck/head, jaw or voice tremor. Neck is supple with  full range of passive and active motion. There are no carotid bruits on auscultation. Oropharynx exam reveals: mild mouth dryness, good dental hygiene and mild airway crowding, due to smaller airway opening. Mallampati is class I. Tongue protrudes centrally and palate elevates symmetrically. Tonsils are absent. Neck size is 16.5 inches. She has a minimal to mild overbite.    Chest: Clear to auscultation without wheezing, rhonchi or crackles noted.  Heart: S1+S2+0, regular and normal without murmurs, rubs or gallops noted.   Abdomen: Soft, non-tender and non-distended with normal bowel sounds appreciated on auscultation.  Extremities: There is no pitting edema in the distal lower extremities bilaterally. Pedal pulses are intact.  Skin: Warm and dry without trophic changes noted.  Musculoskeletal: exam reveals no obvious joint deformities, tenderness or joint swelling or erythema.   Neurologically:  Mental status: The patient is awake, alert and oriented in all 4 spheres. Her immediate and remote memory, attention, language skills and fund of knowledge are appropriate. There is no evidence of aphasia, agnosia, apraxia or anomia. Speech is clear with normal prosody and enunciation. Thought process is linear. Mood is normal and affect is normal.   Cranial nerves II - XII are as described above under HEENT exam. In addition: shoulder shrug is normal with equal shoulder height noted. Motor exam: Normal bulk, strength and tone is noted. There is no drift, tremor or rebound. Romberg is negative. Reflexes are 2+ throughout. Fine motor skills and coordination: intact with normal finger taps, normal hand movements, normal rapid alternating patting, normal foot taps and normal foot agility.  Cerebellar testing: No dysmetria or intention tremor on finger to nose testing. Heel to shin is unremarkable bilaterally. There is no truncal or gait ataxia.  Sensory exam: intact to light touch.  Gait, station and  balance: She stands easily. No veering to one side is noted. No leaning to one side is noted. Posture is age-appropriate and stance is narrow based. Gait shows normal stride length and normal pace. No problems turning are noted. Tandem walk is unremarkable.  Assessment and Plan:  In summary, Shannon [name] is a very pleasant 38 y.o.-year old female ith an underlying medical history of asthma, anxiety, depression, vitamin D deficiency and morbid obesity with a BMI of over 40, whose history and physical exam are concerning for obstructive sleep apnea (OSA). I had a long chat with the patient about my findings and the diagnosis of OSA, its prognosis and treatment options. We talked about medical treatments, surgical interventions and non-pharmacological approaches. I explained in particular the risks and ramifications of untreated moderate to severe OSA, especially with respect to developing cardiovascular disease down the Road, including congestive heart failure, difficult to treat hypertension, cardiac arrhythmias, or stroke. Even type 2 diabetes has, in part, been linked to untreated OSA. Symptoms of untreated OSA include daytime sleepiness, memory problems, mood irritability and mood disorder such as depression and anxiety, lack of energy, as well as recurrent headaches, especially morning headaches. We talked about trying to maintain a healthy lifestyle in general, as well as the importance of weight control. I encouraged the patient to eat healthy, exercise daily and keep well hydrated, to keep a scheduled bedtime and wake time routine, to not skip any meals and eat healthy snacks in between meals. I advised the patient not to drive when feeling sleepy. I recommended the following at this time: sleep study with potential positive airway pressure titration. (We will score hypopneas at 3%).   I explained the sleep test procedure to the patient and also outlined possible surgical and non-surgical  treatment options of OSA, including the use of a custom-made dental device (which would require a referral to a specialist dentist or oral surgeon), upper airway surgical options, such as pillar implants, radiofrequency surgery, tongue base surgery, and UPPP (which would involve a referral to an ENT surgeon). Rarely, jaw surgery such as mandibular advancement may be considered.  I also explained the CPAP treatment option to the patient, who indicated that she would be willing to try CPAP if the need arises. I explained the importance of being compliant with PAP treatment, not only for insurance purposes but primarily to improve Her symptoms, and for the patient's long term health benefit, including to reduce Her cardiovascular risks. I answered all her questions today and the patient was in agreement. I plan to see her back after the sleep study is completed and encouraged her to call with any interim questions, concerns, problems or updates.   Thank you very much for allowing me to participate in the care of this nice patient. If I can be of any further assistance to you please do not hesitate to call me at [phone].  Sincerely,   Star Age, MD, PhD

## 2018-11-13 NOTE — Patient Instructions (Signed)

## 2018-11-25 ENCOUNTER — Ambulatory Visit (INDEPENDENT_AMBULATORY_CARE_PROVIDER_SITE_OTHER): Payer: BC Managed Care – PPO | Admitting: Internal Medicine

## 2018-11-25 ENCOUNTER — Encounter: Payer: Self-pay | Admitting: Internal Medicine

## 2018-11-25 ENCOUNTER — Other Ambulatory Visit: Payer: Self-pay

## 2018-11-25 VITALS — BP 118/76 | HR 95 | Temp 98.0°F | Ht 64.0 in | Wt 247.6 lb

## 2018-11-25 DIAGNOSIS — F99 Mental disorder, not otherwise specified: Secondary | ICD-10-CM | POA: Diagnosis not present

## 2018-11-25 DIAGNOSIS — F5105 Insomnia due to other mental disorder: Secondary | ICD-10-CM

## 2018-11-25 DIAGNOSIS — Z6841 Body Mass Index (BMI) 40.0 and over, adult: Secondary | ICD-10-CM | POA: Diagnosis not present

## 2018-11-25 MED ORDER — PHENTERMINE HCL 37.5 MG PO CAPS: 37.5000 mg | ORAL_CAPSULE | ORAL | 1 refills | Status: DC

## 2018-11-25 MED ORDER — ALBUTEROL SULFATE HFA 108 (90 BASE) MCG/ACT IN AERS: 2.0000 | INHALATION_SPRAY | Freq: Four times a day (QID) | RESPIRATORY_TRACT | 3 refills | Status: DC | PRN

## 2018-11-25 NOTE — Patient Instructions (Signed)

## 2018-12-07 NOTE — Progress Notes (Signed)
Subjective:     Patient ID: Shannon [name] , female    DOB: 01/30/1981 , 38 y.o.   MRN: [id number]   Chief Complaint  Patient presents with  . Obesity    HPI  She is here today for f/u obesity.  She has been taking Saxenda without any issues. She has been going to the gym.     Past Medical History:  Diagnosis Date  . Asthma   . Hypertension    Preeclampsia post delivery  . Irregular heart beat      Family History  Problem Relation Age of Onset  . Hypertension Mother   . Diabetes Mother   . Mental illness Mother   . Hypertension Father   . Cancer Father   . Asthma Brother   . Heart disease Maternal Grandmother   . Hypertension Maternal Grandmother   . Cancer Paternal Grandmother        breast cancer     Current Outpatient Medications:  .  albuterol (PROVENTIL HFA;VENTOLIN HFA) 108 (90 Base) MCG/ACT inhaler, Inhale 2 puffs into the lungs every 6 (six) hours as needed for wheezing or shortness of breath., Disp: 1 Inhaler, Rfl: 3 .  BIOTIN PO, Take 10,000 Units by mouth daily. , Disp: , Rfl:  .  buPROPion (WELLBUTRIN XL) 300 MG 24 hr tablet, Take 1 tablet by mouth daily. , Disp: , Rfl: 1 .  Cholecalciferol (VITAMIN D) 2000 UNITS CAPS, Take 5,000 Units by mouth daily., Disp: , Rfl:  .  DULoxetine (CYMBALTA) 60 MG capsule, Take 1 capsule by mouth daily. , Disp: , Rfl: 1 .  Flaxseed, Linseed, (FLAXSEED OIL) 1000 MG CAPS, Take 1 capsule by mouth daily. , Disp: , Rfl:  .  Hyoscyamine Sulfate SL (LEVSIN/SL) 0.125 MG SUBL, Place 0.125 mg under the tongue 4 (four) times daily as needed., Disp: 15 each, Rfl: 0 .  Liraglutide -Weight Management (SAXENDA) 18 MG/3ML SOPN, Inject 3 mg into the skin daily., Disp: 5 pen, Rfl: 1 .  magnesium oxide (MAG-OX) 400 MG tablet, TAKE 1 TABLET BY MOUTH ONCE DAILY, Disp: 30 tablet, Rfl: 4 .  Multiple Vitamins-Minerals (CENTRUM ADULTS PO), Take 1 tablet by mouth daily. , Disp: , Rfl:  .  Omega 3-6-9 Fatty Acids (OMEGA-3-6-9 PO), Take 1 capsule by  mouth daily. , Disp: , Rfl:  .  ondansetron (ZOFRAN ODT) 4 MG disintegrating tablet, Take 1 tablet (4 mg total) by mouth every 8 (eight) hours as needed for nausea or vomiting., Disp: 8 tablet, Rfl: 0 .  Vitamin D, Ergocalciferol, (DRISDOL) 1.25 MG (50000 UT) CAPS capsule, Take 1 capsule by mouth on Tuesdays and Fridays, Disp: 8 capsule, Rfl: 1 .  phentermine 37.5 MG capsule, Take 1 capsule (37.5 mg total) by mouth every morning., Disp: 30 capsule, Rfl: 1   Allergies  Allergen Reactions  . Diclofenac Rash  . Pineapple Shortness Of Breath  . Ceftriaxone Sodium Hives  . Diflucan [Fluconazole] Hives    Raw rash on abdomen and she had sores and swelling in mouth   . Eggs Or Egg-Derived Products Nausea And Vomiting    Can eat cake made with eggs  . Bactrim [Sulfamethoxazole-Trimethoprim] Hives  . Latex Rash     Review of Systems  Constitutional: Negative.   Respiratory: Negative.   Cardiovascular: Negative.   Gastrointestinal: Negative.   Neurological: Negative.   Psychiatric/Behavioral: Negative.      Today's Vitals   11/25/18 1114  BP: 118/76  Pulse: 95  Temp: 98 F (36.7  C)  TempSrc: Oral  Weight: 247 lb 9.6 oz (112.3 kg)  Height: 5\' 4"  (1.626 m)   Body mass index is 42.5 kg/m.   Objective:  Physical Exam Vitals signs and nursing note reviewed.  Constitutional:      Appearance: Normal appearance. She is obese.  HENT:     Head: Normocephalic and atraumatic.  Cardiovascular:     Rate and Rhythm: Normal rate and regular rhythm.     Heart sounds: Normal heart sounds.  Pulmonary:     Effort: Pulmonary effort is normal.     Breath sounds: Normal breath sounds.  Skin:    General: Skin is warm.  Neurological:     General: No focal deficit present.     Mental Status: She is alert.  Psychiatric:        Mood and Affect: Mood normal.         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 She will continue with meds as per Psych. She may benefit from both magnesium and melatonin nightly.   2. Class 3 severe obesity due to excess calories without serious comorbidity with body mass index (BMI) of 40.0 to 44.9 in adult [facility])  She will continue with Saxenda. Importance of achieving optimal weight to decrease risk of cardiovascular disease and cancers was discussed with the patient in full detail. She is encouraged to start slowly - start with 10 minutes twice daily at least three to four days per week and to gradually build to 30 minutes five days weekly. She was given tips to incorporate more activity into her daily routine - take stairs when possible, park farther away from her job, grocery stores, etc. Greater than 50% of time was spent in counseling and coordination of care.     Shannon Greenland, MD

## 2019-01-07 ENCOUNTER — Telehealth: Payer: Self-pay

## 2019-01-07 NOTE — Telephone Encounter (Signed)
Called pt to make aware of PA approval but pt isnt taking the medication

## 2019-01-08 ENCOUNTER — Ambulatory Visit (INDEPENDENT_AMBULATORY_CARE_PROVIDER_SITE_OTHER): Payer: BC Managed Care – PPO | Admitting: Internal Medicine

## 2019-01-08 ENCOUNTER — Other Ambulatory Visit: Payer: Self-pay

## 2019-01-08 ENCOUNTER — Encounter: Payer: Self-pay | Admitting: Internal Medicine

## 2019-01-08 VITALS — BP 122/78 | HR 104 | Temp 98.9°F | Ht 64.0 in | Wt 237.4 lb

## 2019-01-08 DIAGNOSIS — G4701 Insomnia due to medical condition: Secondary | ICD-10-CM | POA: Diagnosis not present

## 2019-01-08 DIAGNOSIS — R000 Tachycardia, unspecified: Secondary | ICD-10-CM | POA: Diagnosis not present

## 2019-01-08 DIAGNOSIS — Z6841 Body Mass Index (BMI) 40.0 and over, adult: Secondary | ICD-10-CM

## 2019-01-08 MED ORDER — PHENTERMINE HCL 37.5 MG PO CAPS: 37.5000 mg | ORAL_CAPSULE | ORAL | 1 refills | Status: DC

## 2019-01-08 MED ORDER — LIRAGLUTIDE -WEIGHT MANAGEMENT 18 MG/3ML ~~LOC~~ SOPN: 3.0000 mg | PEN_INJECTOR | Freq: Every day | SUBCUTANEOUS | 1 refills | Status: DC

## 2019-01-08 NOTE — Patient Instructions (Signed)
Sinus Tachycardia  Sinus tachycardia is a kind of fast heartbeat. In sinus tachycardia, the heart beats more than 100 times a minute. Sinus tachycardia starts in a part of the heart called the sinus node. Sinus tachycardia may be harmless, or it may be a sign of a serious condition. What are the causes? This condition may be caused by:  Exercise or exertion.  A fever.  Pain.  Loss of body fluids (dehydration).  Severe bleeding (hemorrhage).  Anxiety and stress.  Certain substances, including: ? Alcohol. ? Caffeine. ? Tobacco and nicotine products. ? Cold medicines. ? Illegal drugs.  Medical conditions including: ? Heart disease. ? An infection. ? An overactive thyroid (hyperthyroidism). ? A lack of red blood cells (anemia). What are the signs or symptoms? Symptoms of this condition include:  A feeling that the heart is beating quickly (palpitations).  Suddenly noticing your heartbeat (cardiac awareness).  Dizziness.  Tiredness (fatigue).  Shortness of breath.  Chest pain.  Nausea.  Fainting. How is this diagnosed? This condition is diagnosed with:  A physical exam.  Other tests, such as: ? Blood tests. ? An electrocardiogram (ECG). This test measures the electrical activity of the heart. ? Ambulatory cardiac monitor. This records your heartbeats for 24 hours or more. You may be referred to a heart specialist (cardiologist). How is this treated? Treatment for this condition depends on the cause or the underlying condition. Treatment may involve:  Treating the underlying condition.  Taking new medicines or changing your current medicines as told by your health care provider.  Making changes to your diet or lifestyle. Follow these instructions at home: Lifestyle   Do not use any products that contain nicotine or tobacco, such as cigarettes and e-cigarettes. If you need help quitting, ask your health care provider.  Do not use illegal drugs, such as  cocaine.  Learn relaxation methods to help you when you get stressed or anxious. These include deep breathing.  Avoid caffeine or other stimulants. Alcohol use   Do not drink alcohol if: ? Your health care provider tells you not to drink. ? You are pregnant, may be pregnant, or are planning to become pregnant.  If you drink alcohol, limit how much you have: ? 0-1 drink a day for women. ? 0-2 drinks a day for men.  Be aware of how much alcohol is in your drink. In the [place] one drink equals one typical bottle of beer (12 oz), one-half glass of wine (5 oz), or one shot of hard liquor (1 oz). General instructions  Drink enough fluids to keep your urine pale yellow.  Take over-the-counter and prescription medicines only as told by your health care provider.  Keep all follow-up visits as told by your health care provider. This is important. Contact a health care provider if you have:  A fever.  Vomiting or diarrhea that does not go away. Get help right away if you:  Have pain in your chest, upper arms, jaw, or neck.  Become weak or dizzy.  Feel faint.  Have palpitations that do not go away. Summary  In sinus tachycardia, the heart beats more than 100 times a minute.  Sinus tachycardia may be harmless, or it may be a sign of a serious condition.  Treatment for this condition depends on the cause or the underlying condition.  Get help right away if you have pain in your chest, upper arms, jaw, or neck. This information is not intended to replace advice given to you by   your health care provider. Make sure you discuss any questions you have with your health care provider. Document Released: 11/15/2004 Document Revised: 11/27/2017 Document Reviewed: 11/27/2017 Elsevier Interactive Patient Education  2019 [employer].

## 2019-01-18 NOTE — Progress Notes (Addendum)
Subjective:     Patient ID: Shannon [name] , female    DOB: [birth year] , 38 y.o.   MRN: [id number]   Chief Complaint  Patient presents with  . Obesity    HPI  She is here today for f/u obesity. She has been taking phentermine. She reports she has been exercising more frequently.     Past Medical History:  Diagnosis Date  . Asthma   . Hypertension    Preeclampsia post delivery  . Irregular heart beat      Family History  Problem Relation Age of Onset  . Hypertension Mother   . Diabetes Mother   . Mental illness Mother   . Hypertension Father   . Cancer Father   . Asthma Brother   . Heart disease Maternal Grandmother   . Hypertension Maternal Grandmother   . Cancer Paternal Grandmother        breast cancer     Current Outpatient Medications:  .  albuterol (PROVENTIL HFA;VENTOLIN HFA) 108 (90 Base) MCG/ACT inhaler, Inhale 2 puffs into the lungs every 6 (six) hours as needed for wheezing or shortness of breath., Disp: 1 Inhaler, Rfl: 3 .  BIOTIN PO, Take 10,000 Units by mouth daily. , Disp: , Rfl:  .  buPROPion (WELLBUTRIN XL) 300 MG 24 hr tablet, Take 1 tablet by mouth daily. , Disp: , Rfl: 1 .  DULoxetine (CYMBALTA) 60 MG capsule, Take 1 capsule by mouth daily. , Disp: , Rfl: 1 .  magnesium oxide (MAG-OX) 400 MG tablet, TAKE 1 TABLET BY MOUTH ONCE DAILY, Disp: 30 tablet, Rfl: 4 .  Multiple Vitamins-Minerals (CENTRUM ADULTS PO), Take 1 tablet by mouth daily. , Disp: , Rfl:  .  phentermine 37.5 MG capsule, Take 1 capsule (37.5 mg total) by mouth every morning., Disp: 30 capsule, Rfl: 1 .  Cholecalciferol (VITAMIN D) 2000 UNITS CAPS, Take 5,000 Units by mouth daily., Disp: , Rfl:  .  Liraglutide -Weight Management (SAXENDA) 18 MG/3ML SOPN, Inject 3 mg into the skin daily., Disp: 5 pen, Rfl: 1   Allergies  Allergen Reactions  . Diclofenac Rash  . Pineapple Shortness Of Breath  . Ceftriaxone Sodium Hives  . Diflucan [Fluconazole] Hives    Raw rash on abdomen and she had  sores and swelling in mouth   . Eggs Or Egg-Derived Products Nausea And Vomiting    Can eat cake made with eggs  . Bactrim [Sulfamethoxazole-Trimethoprim] Hives  . Latex Rash     Review of Systems  Constitutional: Negative.   Respiratory: Negative.   Cardiovascular: Negative.   Gastrointestinal: Negative.   Neurological: Negative.   Psychiatric/Behavioral: Positive for sleep disturbance.     Today's Vitals   01/08/19 1515  BP: 122/78  Pulse: (!) 104  Temp: 98.9 F (37.2 C)  TempSrc: Oral  Weight: 237 lb 6.4 oz (107.7 kg)  Height: 5\' 4"  (1.626 m)   Body mass index is 40.75 kg/m.   Objective:  Physical Exam Vitals signs and nursing note reviewed.  Constitutional:      Appearance: Normal appearance.  HENT:     Head: Normocephalic and atraumatic.  Cardiovascular:     Rate and Rhythm: Normal rate and regular rhythm.     Heart sounds: Normal heart sounds.  Pulmonary:     Effort: Pulmonary effort is normal.     Breath sounds: Normal breath sounds.  Skin:    General: Skin is warm.  Neurological:     General: No focal deficit present.  Mental Status: She is alert.  Psychiatric:        Mood and Affect: Mood normal.        Behavior: Behavior normal.         Assessment And Plan:     1. Class 3 severe obesity due to excess calories without serious comorbidity with body mass index (BMI) of 40.0 to 44.9 in adult Shannon [name])  She was congratulated on her 10 pound wieght loss and encouraged to keep up the great work. She was given refill on phentermine. She will rto in 8 weeks for re-evaluation.   2. 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 She is encouraged to continue with regular bedtime regimen.    3. Tachycardia, unspecified  She is encouraged to stay well hydrated and to take magnesium nightly. I will re-evaluate at her next visit. Hr was less than 100 at her last visit.     Shannon Greenland, MD

## 2019-02-02 ENCOUNTER — Telehealth: Payer: Self-pay

## 2019-02-02 ENCOUNTER — Encounter: Payer: Self-pay | Admitting: Internal Medicine

## 2019-02-02 NOTE — Telephone Encounter (Signed)
I called the pt and scheduled her an appt for a virtual visit after she agreed to it.

## 2019-02-03 ENCOUNTER — Ambulatory Visit (INDEPENDENT_AMBULATORY_CARE_PROVIDER_SITE_OTHER): Payer: BC Managed Care – PPO | Admitting: Internal Medicine

## 2019-02-03 ENCOUNTER — Other Ambulatory Visit: Payer: Self-pay

## 2019-02-03 ENCOUNTER — Encounter: Payer: Self-pay | Admitting: Internal Medicine

## 2019-02-03 VITALS — Ht 64.0 in

## 2019-02-03 DIAGNOSIS — K219 Gastro-esophageal reflux disease without esophagitis: Secondary | ICD-10-CM

## 2019-02-03 DIAGNOSIS — Z7189 Other specified counseling: Secondary | ICD-10-CM

## 2019-02-03 DIAGNOSIS — J029 Acute pharyngitis, unspecified: Secondary | ICD-10-CM | POA: Diagnosis not present

## 2019-02-03 DIAGNOSIS — H9203 Otalgia, bilateral: Secondary | ICD-10-CM

## 2019-02-03 MED ORDER — ESOMEPRAZOLE MAGNESIUM 40 MG PO CPDR: 40.0000 mg | DELAYED_RELEASE_CAPSULE | Freq: Every day | ORAL | 1 refills | Status: DC

## 2019-02-03 MED ORDER — CHLORPHEN-PE-ACETAMINOPHEN 4-10-325 MG PO TABS: 4.0000 mg | ORAL_TABLET | Freq: Two times a day (BID) | ORAL | 0 refills | Status: DC | PRN

## 2019-02-03 NOTE — Progress Notes (Addendum)
Virtual Visit via Video Note   This visit type was conducted due to national recommendations for restrictions regarding the COVID-19 Pandemic (e.g. social distancing).  This format is felt to be most appropriate for this patient at this time.  All issues noted in this document were discussed and addressed.  No physical exam was performed (except for noted visual exam findings with Video Visits).  Please refer to the patient's chart (MyChart message for video visits and phone note for telephone visits) for the patient's consent to telehealth for [facility].  Date:  02/03/2019   ID:  Shannon [name], DOB Jan 18, 1981, MRN [id number]  Patient Location:  Home  Provider location:   Office   Chief Complaint:  Ear pain/sore throat  History of Present Illness:    Shannon [name] is a 38 y.o. female who presents via video conferencing for a telehealth visit today.   The patient does not have symptoms concerning for COVID-19 infection (fever, chills, cough, or new shortness of breath).   Otalgia   There is pain in both ears. This is a recurrent problem. The current episode started 1 to 4 weeks ago. The problem occurs constantly. The problem has been unchanged. There has been no fever. The pain is at a severity of 7/10. The pain is moderate. Associated symptoms include a sore throat. Pertinent negatives include no coughing or ear discharge. She has tried acetaminophen for the symptoms. The treatment provided no relief.  Sore Throat   This is a new problem. The current episode started 1 to 4 weeks ago. The problem has been unchanged. Neither side of throat is experiencing more pain than the other. There has been no fever. Associated symptoms include congestion and ear pain. Pertinent negatives include no coughing, ear discharge or shortness of breath. She has had no exposure to strep or mono. She has tried acetaminophen for the symptoms. The treatment provided no relief.     Past Medical History:  Diagnosis  Date  . Asthma   . Hypertension    Preeclampsia post delivery  . Irregular heart beat    Past Surgical History:  Procedure Laterality Date  . DILATATION & CURRETTAGE/HYSTEROSCOPY WITH RESECTOCOPE N/A 02/03/2014   Procedure: DILATATION & CURETTAGE/HYSTEROSCOPY ;  Surgeon: Delice Lesch, MD;  Location: [facility];  Service: Gynecology;  Laterality: N/A;  . MYOMECTOMY N/A 02/03/2014   Procedure: MYOMECTOMY ;  Surgeon: Delice Lesch, MD;  Location: [facility];  Service: Gynecology;  Laterality: N/A;  . tonsils and adnoids removed    . TUBAL LIGATION N/A 08/19/2015   Procedure: POST PARTUM TUBAL LIGATION;  Surgeon: Everett Graff, MD;  Location: [facility];  Service: Gynecology;  Laterality: N/A;     Current Meds  Medication Sig  . albuterol (PROVENTIL HFA;VENTOLIN HFA) 108 (90 Base) MCG/ACT inhaler Inhale 2 puffs into the lungs every 6 (six) hours as needed for wheezing or shortness of breath.  Marland Kitchen BIOTIN PO Take 10,000 Units by mouth daily.   . Cholecalciferol (VITAMIN D) 2000 UNITS CAPS Take 5,000 Units by mouth daily.  . DULoxetine (CYMBALTA) 60 MG capsule Take 1 capsule by mouth daily.   . Liraglutide -Weight Management (SAXENDA) 18 MG/3ML SOPN Inject 3 mg into the skin daily.  . magnesium oxide (MAG-OX) 400 MG tablet TAKE 1 TABLET BY MOUTH ONCE DAILY  . Multiple Vitamins-Minerals (CENTRUM ADULTS PO) Take 1 tablet by mouth daily.   . phentermine 37.5 MG capsule Take 1 capsule (37.5 mg total) by mouth every morning.  . [  DISCONTINUED] buPROPion (WELLBUTRIN XL) 300 MG 24 hr tablet Take 1 tablet by mouth daily.      Allergies:   Diclofenac; Pineapple; Ceftriaxone sodium; Diflucan [fluconazole]; Eggs or egg-derived products; Bactrim [sulfamethoxazole-trimethoprim]; and Latex   Social History   Tobacco Use  . Smoking status: Never Smoker  . Smokeless tobacco: Never Used  Substance Use Topics  . Alcohol use: No    Comment: occasionally  . Drug use: No     Family Hx: The patient's family  history includes Asthma in her brother; Cancer in her father and paternal grandmother; Diabetes in her mother; Heart disease in her maternal grandmother; Hypertension in her father, maternal grandmother, and mother; Mental illness in her mother.  ROS:   Please see the history of present illness.    Review of Systems  Constitutional: Positive for malaise/fatigue.  HENT: Positive for congestion, ear pain and sore throat. Negative for ear discharge.   Respiratory: Negative.  Negative for cough, shortness of breath and wheezing.   Cardiovascular: Negative.   Gastrointestinal: Negative.   Neurological: Negative.   Psychiatric/Behavioral: Negative.     All other systems reviewed and are negative.   Labs/Other Tests and Data Reviewed:    Recent Labs: 08/19/2018: TSH 1.350 10/07/2018: ALT 22; BUN 12; Creatinine, Ser 1.02; Hemoglobin 13.2; Platelets 246; Potassium 4.4; Sodium 140   Recent Lipid Panel Lab Results  Component Value Date/Time   CHOL 185 11/20/2012 03:35 PM   TRIG 76 11/20/2012 03:35 PM   HDL 63 11/20/2012 03:35 PM   CHOLHDL 2.9 11/20/2012 03:35 PM   LDLCALC 107 (H) 11/20/2012 03:35 PM    Wt Readings from Last 3 Encounters:  01/08/19 237 lb 6.4 oz (107.7 kg)  11/25/18 247 lb 9.6 oz (112.3 kg)  11/13/18 245 lb (111.1 kg)     Exam:    Vital Signs:  Ht 5\' 4"  (1.626 m)   LMP  (LMP Unknown)   BMI 40.75 kg/m     Physical Exam  Constitutional: She is oriented to person, place, and time. She appears distressed.  HENT:  Head: Normocephalic and atraumatic.  Neck: Normal range of motion.  Pulmonary/Chest: Effort normal.  Neurological: She is alert and oriented to person, place, and time.  Psychiatric: Affect normal.  Nursing note and vitals reviewed. She appears to have discomfort. Appears to have discomfort with swallowing.   ASSESSMENT & PLAN:     1. Otalgia of both ears  She was given rx Norel AD one tab twice daily. She is advised to stop the Mucinex she has  been taking. She will let me know if she develops a fever. Work/school note given to her.  She is to return to work/school tomorrow.   2. Sore throat  I think her sx are due to postnasal drip. Again, the antihistamine in Norel AD should be helpful. She will let me know if her sx persist.   3. Gastroesophageal reflux disease without esophagitis  She is advised to stop eating 3 hours prior to going to bed. She was also given list of foods to avoid w/ reflux. She was also given rx nexium 40mg  to take daily. She has physical scheduled May 2020 and I will f/u at that time. My hope is that she will not need this for more than 60 days.   4. Morbid obesity ([place])  Unfortunately, she is not able to weigh at home. She is encouraged to continue with her regular exercise regimen (150 min per week). I will reassess at  her next visit.     COVID-19 Education: The signs and symptoms of COVID-19 were discussed with the patient and how to seek care for testing (follow up with PCP or arrange E-visit).  The importance of social distancing was discussed today.  Patient Risk:   After full review of this patients clinical status, I feel that they are at least moderate risk at this time.  Time:   Today, I have spent 14 minutes 28 seconds with the patient with telehealth technology discussing above diagnoses.   Medication Adjustments/Labs and Tests Ordered: Current medicines are reviewed at length with the patient today.  Concerns regarding medicines are outlined above.   Tests Ordered: No orders of the defined types were placed in this encounter.  Medication Changes: Meds ordered this encounter  Medications  . esomeprazole (NEXIUM) 40 MG capsule    Sig: Take 1 capsule (40 mg total) by mouth daily.    Dispense:  30 capsule    Refill:  1  . Chlorphen-PE-Acetaminophen (NOREL AD) 4-10-325 MG TABS    Sig: Take 4-10 mg by mouth 2 (two) times daily as needed.    Dispense:  20 tablet    Refill:  0     Disposition:  Follow up prn  Signed, Maximino Greenland, MD

## 2019-02-03 NOTE — Patient Instructions (Signed)

## 2019-02-05 ENCOUNTER — Other Ambulatory Visit: Payer: Self-pay | Admitting: Internal Medicine

## 2019-02-05 ENCOUNTER — Encounter: Payer: Self-pay | Admitting: Internal Medicine

## 2019-02-05 MED ORDER — AZITHROMYCIN 250 MG PO TABS: ORAL_TABLET | ORAL | 0 refills | Status: AC

## 2019-02-25 ENCOUNTER — Encounter: Payer: Self-pay | Admitting: Internal Medicine

## 2019-03-04 ENCOUNTER — Encounter: Payer: BC Managed Care – PPO | Admitting: Internal Medicine

## 2019-03-17 ENCOUNTER — Encounter: Payer: BC Managed Care – PPO | Admitting: Internal Medicine

## 2019-03-17 ENCOUNTER — Encounter: Payer: Self-pay | Admitting: Internal Medicine

## 2019-03-17 ENCOUNTER — Telehealth: Payer: Self-pay | Admitting: Internal Medicine

## 2019-03-17 NOTE — Telephone Encounter (Signed)
PT WAS SCHEDULED FOR APPT TODAY 5/26@12PM  FOR PHYSICAL. PT ARRIVED TO APPT AT 12:18PM, THERE IS 10-MINUTE GRACE PERIOD FOR APPTS, I ASKED PROVIDER IF SHE COULD BE CHECK-IN-  PER SANDERS DUE TO APPT BEING PHYSICAL AND PT LATE WE WOULD HAVE TO RESCHEDULE, ADV PT WHAT PROVIDER STATED- PT SAID THAT WE ARE INCONVENIENCE TO HER BECAUSE WE CANCELLED LAST APPT ( DUE TO COVID 19 PROCEDURES)  AND SHE HAS NEVER BEEN LATE TO APPT . ADV PT THAT WE CAN RESCHEDULE HER SHE STATED NO THAT SHE WILL BE FINDING A NEW PROVIDER

## 2019-05-25 ENCOUNTER — Other Ambulatory Visit: Payer: Self-pay | Admitting: Internal Medicine

## 2019-05-26 ENCOUNTER — Other Ambulatory Visit: Payer: Self-pay

## 2019-05-26 ENCOUNTER — Ambulatory Visit (INDEPENDENT_AMBULATORY_CARE_PROVIDER_SITE_OTHER): Payer: BC Managed Care – PPO | Admitting: Nurse Practitioner

## 2019-05-26 ENCOUNTER — Encounter: Payer: Self-pay | Admitting: Internal Medicine

## 2019-05-26 VITALS — BP 118/92 | HR 98 | Temp 98.8°F | Ht 64.0 in | Wt 232.6 lb

## 2019-05-26 DIAGNOSIS — E6609 Other obesity due to excess calories: Secondary | ICD-10-CM | POA: Insufficient documentation

## 2019-05-26 DIAGNOSIS — R030 Elevated blood-pressure reading, without diagnosis of hypertension: Secondary | ICD-10-CM

## 2019-05-26 DIAGNOSIS — Z6839 Body mass index (BMI) 39.0-39.9, adult: Secondary | ICD-10-CM | POA: Diagnosis not present

## 2019-05-26 DIAGNOSIS — E66812 Obesity, class 2: Secondary | ICD-10-CM

## 2019-05-26 HISTORY — DX: Other obesity due to excess calories: E66.09

## 2019-05-26 HISTORY — DX: Elevated blood-pressure reading, without diagnosis of hypertension: R03.0

## 2019-05-26 HISTORY — DX: Obesity, class 2: E66.812

## 2019-05-26 MED ORDER — SAXENDA 18 MG/3ML ~~LOC~~ SOPN: 3.0000 mg | PEN_INJECTOR | Freq: Every day | SUBCUTANEOUS | 1 refills | Status: DC

## 2019-05-26 MED ORDER — PHENTERMINE HCL 37.5 MG PO CAPS: 37.5000 mg | ORAL_CAPSULE | ORAL | 1 refills | Status: DC

## 2019-05-26 NOTE — Progress Notes (Signed)
Subjective:     Patient ID: Shannon [name] , female    DOB: 08-03-1981 , 38 y.o.   MRN: [id number]   Chief Complaint  Patient presents with  . Obesity    HPI  She is taking phentermine 37.5 mg daily which is helping her motivation.  She is also taking Saxenda 2.4 mg due to causing her nausea however helps her with her cravings.    Wt Readings from Last 3 Encounters: 05/26/19 : 232 lb 9.6 oz (105.5 kg) 01/08/19 : 237 lb 6.4 oz (107.7 kg) 11/25/18 : 247 lb 9.6 oz (112.3 kg)  No exercise currently, just went through a bad depression.  Felt confined.    Diet is improving.  She is avoiding foods in the last 2 weeks.  Eating more fruits and vegetables.      Past Medical History:  Diagnosis Date  . Asthma   . Hypertension    Preeclampsia post delivery  . Irregular heart beat      Family History  Problem Relation Age of Onset  . Hypertension Mother   . Diabetes Mother   . Mental illness Mother   . Hypertension Father   . Cancer Father   . Asthma Brother   . Heart disease Maternal Grandmother   . Hypertension Maternal Grandmother   . Cancer Paternal Grandmother        breast cancer     Current Outpatient Medications:  .  albuterol (PROVENTIL HFA;VENTOLIN HFA) 108 (90 Base) MCG/ACT inhaler, Inhale 2 puffs into the lungs every 6 (six) hours as needed for wheezing or shortness of breath., Disp: 1 Inhaler, Rfl: 3 .  BIOTIN PO, Take 10,000 Units by mouth daily. , Disp: , Rfl:  .  Cholecalciferol (VITAMIN D) 2000 UNITS CAPS, Take 5,000 Units by mouth daily., Disp: , Rfl:  .  DULoxetine (CYMBALTA) 30 MG capsule, Take 30 mg by mouth daily. Take along with the 60 mg as needed, Disp: , Rfl:  .  DULoxetine (CYMBALTA) 60 MG capsule, Take 1 capsule by mouth daily. , Disp: , Rfl: 1 .  esomeprazole (NEXIUM) 40 MG capsule, Take 1 capsule (40 mg total) by mouth daily., Disp: 30 capsule, Rfl: 1 .  Liraglutide -Weight Management (SAXENDA) 18 MG/3ML SOPN, Inject 3 mg into the skin daily.,  Disp: 5 pen, Rfl: 1 .  magnesium oxide (MAG-OX) 400 MG tablet, TAKE 1 TABLET BY MOUTH ONCE DAILY, Disp: 30 tablet, Rfl: 4 .  Multiple Vitamins-Minerals (CENTRUM ADULTS PO), Take 1 tablet by mouth daily. , Disp: , Rfl:  .  phentermine 37.5 MG capsule, Take 1 capsule (37.5 mg total) by mouth every morning. (Patient not taking: Reported on 05/26/2019), Disp: 30 capsule, Rfl: 1   Allergies  Allergen Reactions  . Diclofenac Rash  . Pineapple Shortness Of Breath  . Ceftriaxone Sodium Hives  . Diflucan [Fluconazole] Hives    Raw rash on abdomen and she had sores and swelling in mouth   . Eggs Or Egg-Derived Products Nausea And Vomiting    Can eat cake made with eggs  . Bactrim [Sulfamethoxazole-Trimethoprim] Hives  . Latex Rash     Review of Systems  Constitutional: Negative.   Respiratory: Negative.   Cardiovascular: Negative.   Genitourinary: Negative.   Neurological: Negative.   Psychiatric/Behavioral: Negative.  Negative for agitation. The patient is not nervous/anxious.      Today's Vitals   05/26/19 0847  BP: (!) 118/92  Pulse: 98  Temp: 98.8 F (37.1 C)  TempSrc: Oral  Weight: 232 lb 9.6 oz (105.5 kg)  Height: 5\' 4"  (1.626 m)   Body mass index is 39.93 kg/m.   Objective:  Physical Exam Constitutional:      Appearance: Normal appearance.  Cardiovascular:     Rate and Rhythm: Normal rate and regular rhythm.     Pulses: Normal pulses.     Heart sounds: Normal heart sounds. No murmur.  Pulmonary:     Effort: Pulmonary effort is normal.     Breath sounds: Normal breath sounds.  Skin:    Capillary Refill: Capillary refill takes less than 2 seconds.  Neurological:     General: No focal deficit present.     Mental Status: She is alert and oriented to person, place, and time.  Psychiatric:        Mood and Affect: Mood normal.        Behavior: Behavior normal.        Thought Content: Thought content normal.        Judgment: Judgment normal.         Assessment And  Plan:     1. Class 2 obesity due to excess calories without serious comorbidity with body mass index (BMI) of 39.0 to 39.9 in adult  Chronic  She has lost 5 lbs since her last visit 5 months  Continue with current medications  Encouraged to increase her physical activity - phentermine 37.5 MG capsule; Take 1 capsule (37.5 mg total) by mouth every morning.  Dispense: 30 capsule; Refill: 1 - Liraglutide -Weight Management (SAXENDA) 18 MG/3ML SOPN; Inject 3 mg into the skin daily.  Dispense: 5 pen; Refill: 1  2. Elevated blood-pressure reading without diagnosis of hypertension  Slightly elevated systolic can be related to decreased water intake this morning.  Encouraged to avoid high salt foods.   At this time will continue phentermine since only one elevated reading.  Minette Brine, FNP    THE PATIENT IS ENCOURAGED TO PRACTICE SOCIAL DISTANCING DUE TO THE COVID-19 PANDEMIC.

## 2019-05-27 ENCOUNTER — Telehealth: Payer: Self-pay

## 2019-05-27 NOTE — Telephone Encounter (Signed)
PA FOR SAXENDA SENT TO PLAN

## 2019-05-29 ENCOUNTER — Telehealth: Payer: Self-pay

## 2019-05-29 NOTE — Telephone Encounter (Signed)
PT APPROVED FOR SAXENDA COST TO PT IS $24.99 PT PICKED UP.

## 2019-07-08 ENCOUNTER — Encounter: Payer: Self-pay | Admitting: Internal Medicine

## 2019-07-08 ENCOUNTER — Other Ambulatory Visit: Payer: Self-pay

## 2019-07-08 ENCOUNTER — Ambulatory Visit (INDEPENDENT_AMBULATORY_CARE_PROVIDER_SITE_OTHER): Payer: BC Managed Care – PPO | Admitting: Internal Medicine

## 2019-07-08 VITALS — BP 132/76 | HR 106 | Temp 98.7°F | Ht 65.8 in | Wt 235.2 lb

## 2019-07-08 DIAGNOSIS — Z23 Encounter for immunization: Secondary | ICD-10-CM

## 2019-07-08 DIAGNOSIS — Z0000 Encounter for general adult medical examination without abnormal findings: Secondary | ICD-10-CM

## 2019-07-08 LAB — POCT URINALYSIS DIPSTICK
Bilirubin, UA: NEGATIVE
Blood, UA: NEGATIVE
Glucose, UA: NEGATIVE
Nitrite, UA: NEGATIVE
Protein, UA: NEGATIVE
Spec Grav, UA: 1.025 (ref 1.010–1.025)
Urobilinogen, UA: 1 E.U./dL
pH, UA: 6 (ref 5.0–8.0)

## 2019-07-08 NOTE — Patient Instructions (Signed)
Health Maintenance, Female Adopting a healthy lifestyle and getting preventive care are important in promoting health and wellness. Ask your health care provider about:  The right schedule for you to have regular tests and exams.  Things you can do on your own to prevent diseases and keep yourself healthy. What should I know about diet, weight, and exercise? Eat a healthy diet   Eat a diet that includes plenty of vegetables, fruits, low-fat dairy products, and lean protein.  Do not eat a lot of foods that are high in solid fats, added sugars, or sodium. Maintain a healthy weight Body mass index (BMI) is used to identify weight problems. It estimates body fat based on height and weight. Your health care provider can help determine your BMI and help you achieve or maintain a healthy weight. Get regular exercise Get regular exercise. This is one of the most important things you can do for your health. Most adults should:  Exercise for at least 150 minutes each week. The exercise should increase your heart rate and make you sweat (moderate-intensity exercise).  Do strengthening exercises at least twice a week. This is in addition to the moderate-intensity exercise.  Spend less time sitting. Even light physical activity can be beneficial. Watch cholesterol and blood lipids Have your blood tested for lipids and cholesterol at 38 years of age, then have this test every 5 years. Have your cholesterol levels checked more often if:  Your lipid or cholesterol levels are high.  You are older than 38 years of age.  You are at high risk for heart disease. What should I know about cancer screening? Depending on your health history and family history, you may need to have cancer screening at various ages. This may include screening for:  Breast cancer.  Cervical cancer.  Colorectal cancer.  Skin cancer.  Lung cancer. What should I know about heart disease, diabetes, and high blood  pressure? Blood pressure and heart disease  High blood pressure causes heart disease and increases the risk of stroke. This is more likely to develop in people who have high blood pressure readings, are of African descent, or are overweight.  Have your blood pressure checked: ? Every 3-5 years if you are 18-39 years of age. ? Every year if you are 40 years old or older. Diabetes Have regular diabetes screenings. This checks your fasting blood sugar level. Have the screening done:  Once every three years after age 40 if you are at a normal weight and have a low risk for diabetes.  More often and at a younger age if you are overweight or have a high risk for diabetes. What should I know about preventing infection? Hepatitis B If you have a higher risk for hepatitis B, you should be screened for this virus. Talk with your health care provider to find out if you are at risk for hepatitis B infection. Hepatitis C Testing is recommended for:  Everyone born from 1945 through 1965.  Anyone with known risk factors for hepatitis C. Sexually transmitted infections (STIs)  Get screened for STIs, including gonorrhea and chlamydia, if: ? You are sexually active and are younger than 38 years of age. ? You are older than 38 years of age and your health care provider tells you that you are at risk for this type of infection. ? Your sexual activity has changed since you were last screened, and you are at increased risk for chlamydia or gonorrhea. Ask your health care provider if   you are at risk.  Ask your health care provider about whether you are at high risk for HIV. Your health care provider may recommend a prescription medicine to help prevent HIV infection. If you choose to take medicine to prevent HIV, you should first get tested for HIV. You should then be tested every 3 months for as long as you are taking the medicine. Pregnancy  If you are about to stop having your period (premenopausal) and  you may become pregnant, seek counseling before you get pregnant.  Take 400 to 800 micrograms (mcg) of folic acid every day if you become pregnant.  Ask for birth control (contraception) if you want to prevent pregnancy. Osteoporosis and menopause Osteoporosis is a disease in which the bones lose minerals and strength with aging. This can result in bone fractures. If you are 65 years old or older, or if you are at risk for osteoporosis and fractures, ask your health care provider if you should:  Be screened for bone loss.  Take a calcium or vitamin D supplement to lower your risk of fractures.  Be given hormone replacement therapy (HRT) to treat symptoms of menopause. Follow these instructions at home: Lifestyle  Do not use any products that contain nicotine or tobacco, such as cigarettes, e-cigarettes, and chewing tobacco. If you need help quitting, ask your health care provider.  Do not use street drugs.  Do not share needles.  Ask your health care provider for help if you need support or information about quitting drugs. Alcohol use  Do not drink alcohol if: ? Your health care provider tells you not to drink. ? You are pregnant, may be pregnant, or are planning to become pregnant.  If you drink alcohol: ? Limit how much you use to 0-1 drink a day. ? Limit intake if you are breastfeeding.  Be aware of how much alcohol is in your drink. In the [place] one drink equals one 12 oz bottle of beer (355 mL), one 5 oz glass of wine (148 mL), or one 1 oz glass of hard liquor (44 mL). General instructions  Schedule regular health, dental, and eye exams.  Stay current with your vaccines.  Tell your health care provider if: ? You often feel depressed. ? You have ever been abused or do not feel safe at home. Summary  Adopting a healthy lifestyle and getting preventive care are important in promoting health and wellness.  Follow your health care provider's instructions about healthy  diet, exercising, and getting tested or screened for diseases.  Follow your health care provider's instructions on monitoring your cholesterol and blood pressure. This information is not intended to replace advice given to you by your health care provider. Make sure you discuss any questions you have with your health care provider. Document Released: 04/23/2011 Document Revised: 10/01/2018 Document Reviewed: 10/01/2018 Elsevier Patient Education  2020 [employer]  

## 2019-07-08 NOTE — Progress Notes (Signed)
Subjective:     Patient ID: Shannon [name] , female    DOB: May 23, 1981 , 38 y.o.   MRN: [id number]   Chief Complaint  Patient presents with  . Annual Exam    HPI  She is here today for a full physical examination. She has her pap smears performed by Dr. Everett Graff.     Past Medical History:  Diagnosis Date  . Asthma   . Hypertension    Preeclampsia post delivery  . Irregular heart beat      Family History  Problem Relation Age of Onset  . Hypertension Mother   . Diabetes Mother   . Mental illness Mother   . Hypertension Father   . Cancer Father   . Asthma Brother   . Heart disease Maternal Grandmother   . Hypertension Maternal Grandmother   . Cancer Paternal Grandmother        breast cancer     Current Outpatient Medications:  .  albuterol (PROVENTIL HFA;VENTOLIN HFA) 108 (90 Base) MCG/ACT inhaler, Inhale 2 puffs into the lungs every 6 (six) hours as needed for wheezing or shortness of breath., Disp: 1 Inhaler, Rfl: 3 .  BIOTIN PO, Take 10,000 Units by mouth daily. , Disp: , Rfl:  .  Cholecalciferol (VITAMIN D) 2000 UNITS CAPS, Take 5,000 Units by mouth daily., Disp: , Rfl:  .  DULoxetine (CYMBALTA) 30 MG capsule, Take 30 mg by mouth daily. Take along with the 60 mg as needed, Disp: , Rfl:  .  DULoxetine (CYMBALTA) 60 MG capsule, Take 1 capsule by mouth daily. , Disp: , Rfl: 1 .  esomeprazole (NEXIUM) 40 MG capsule, Take 1 capsule (40 mg total) by mouth daily., Disp: 30 capsule, Rfl: 1 .  lisdexamfetamine (VYVANSE) 30 MG capsule, Take 30 mg by mouth daily., Disp: , Rfl:  .  magnesium oxide (MAG-OX) 400 MG tablet, TAKE 1 TABLET BY MOUTH ONCE DAILY, Disp: 30 tablet, Rfl: 4 .  Multiple Vitamins-Minerals (CENTRUM ADULTS PO), Take 1 tablet by mouth daily. , Disp: , Rfl:  .  Liraglutide -Weight Management (SAXENDA) 18 MG/3ML SOPN, Inject 3 mg into the skin daily. (Patient not taking: Reported on 07/08/2019), Disp: 5 pen, Rfl: 1 .  phentermine 37.5 MG capsule, Take 1  capsule (37.5 mg total) by mouth every morning. (Patient not taking: Reported on 07/08/2019), Disp: 30 capsule, Rfl: 1   Allergies  Allergen Reactions  . Diclofenac Rash  . Pineapple Shortness Of Breath  . Ceftriaxone Sodium Hives  . Diflucan [Fluconazole] Hives    Raw rash on abdomen and she had sores and swelling in mouth   . Eggs Or Egg-Derived Products Nausea And Vomiting    Can eat cake made with eggs  . Bactrim [Sulfamethoxazole-Trimethoprim] Hives  . Latex Rash     Review of Systems  Constitutional: Negative.   HENT: Negative.   Eyes: Negative.   Respiratory: Negative.   Cardiovascular: Negative.   Endocrine: Negative.   Genitourinary: Negative.   Musculoskeletal: Negative.   Skin: Negative.   Allergic/Immunologic: Negative.   Neurological: Negative.   Hematological: Negative.   Psychiatric/Behavioral: Negative.      Today's Vitals   07/08/19 1545  BP: 132/76  Pulse: (!) 106  Temp: 98.7 F (37.1 C)  TempSrc: Oral  SpO2: 97%  Weight: 235 lb 3.2 oz (106.7 kg)  Height: 5' 5.8" (1.671 m)   Body mass index is 38.19 kg/m.   Objective:  Physical Exam Vitals signs and nursing note reviewed.  Constitutional:  Appearance: Normal appearance. She is obese.  HENT:     Head: Normocephalic and atraumatic.     Right Ear: Tympanic membrane, ear canal and external ear normal.     Left Ear: Tympanic membrane, ear canal and external ear normal.     Nose: Nose normal.     Mouth/Throat:     Mouth: Mucous membranes are moist.     Pharynx: Oropharynx is clear.  Eyes:     Extraocular Movements: Extraocular movements intact.     Conjunctiva/sclera: Conjunctivae normal.     Pupils: Pupils are equal, round, and reactive to light.  Neck:     Musculoskeletal: Normal range of motion and neck supple.  Cardiovascular:     Rate and Rhythm: Normal rate and regular rhythm.     Pulses: Normal pulses.     Heart sounds: Normal heart sounds.  Pulmonary:     Effort: Pulmonary  effort is normal.     Breath sounds: Normal breath sounds.  Chest:     Comments: Not performed, she kept bra on.  Abdominal:     General: Abdomen is flat. Bowel sounds are normal.     Palpations: Abdomen is soft.  Genitourinary:    Comments: deferred Musculoskeletal: Normal range of motion.  Skin:    General: Skin is warm and dry.  Neurological:     General: No focal deficit present.     Mental Status: She is alert and oriented to person, place, and time.  Psychiatric:        Mood and Affect: Mood normal.        Behavior: Behavior normal.         Assessment And Plan:     1. Encounter for annual physical exam  A full exam was performed. Importance of monthly self breast exams was discussed with the patient.  PATIENT HAS BEEN ADVISED TO GET 30-45 MINUTES REGULAR EXERCISE NO LESS THAN FOUR TO FIVE DAYS PER WEEK - BOTH WEIGHTBEARING EXERCISES AND AEROBIC ARE RECOMMENDED.  SHE WAS ADVISED TO FOLLOW A HEALTHY DIET WITH AT LEAST SIX FRUITS/VEGGIES PER DAY, DECREASE INTAKE OF RED MEAT, AND TO INCREASE FISH INTAKE TO TWO DAYS PER WEEK.  MEATS/FISH SHOULD NOT BE FRIED, BAKED OR BROILED IS PREFERABLE.  I SUGGEST WEARING SPF 50 SUNSCREEN ON EXPOSED PARTS AND ESPECIALLY WHEN IN THE DIRECT SUNLIGHT FOR AN EXTENDED PERIOD OF TIME.  PLEASE AVOID FAST FOOD RESTAURANTS AND INCREASE YOUR WATER INTAKE.  - POCT Urinalysis Dipstick (81002)  2. Need for vaccination for DTaP  - Tdap vaccine greater than or equal to 7yo IM        Maximino Greenland, MD    THE PATIENT IS ENCOURAGED TO PRACTICE SOCIAL DISTANCING DUE TO THE COVID-19 PANDEMIC.

## 2019-07-09 LAB — CBC
Hematocrit: 36.1 % (ref 34.0–46.6)
Hemoglobin: 12 g/dL (ref 11.1–15.9)
MCH: 27.3 pg (ref 26.6–33.0)
MCHC: 33.2 g/dL (ref 31.5–35.7)
MCV: 82 fL (ref 79–97)
Platelets: 249 10*3/uL (ref 150–450)
RBC: 4.39 x10E6/uL (ref 3.77–5.28)
RDW: 12.9 % (ref 11.7–15.4)
WBC: 7 10*3/uL (ref 3.4–10.8)

## 2019-07-09 LAB — CMP14+EGFR
ALT: 20 IU/L (ref 0–32)
AST: 21 IU/L (ref 0–40)
Albumin/Globulin Ratio: 1.7 (ref 1.2–2.2)
Albumin: 4 g/dL (ref 3.8–4.8)
Alkaline Phosphatase: 120 IU/L — ABNORMAL HIGH (ref 39–117)
BUN/Creatinine Ratio: 12 (ref 9–23)
BUN: 11 mg/dL (ref 6–20)
Bilirubin Total: <0.2 mg/dL (ref 0.0–1.2)
CO2: 24 mmol/L (ref 20–29)
Calcium: 9.5 mg/dL (ref 8.7–10.2)
Chloride: 104 mmol/L (ref 96–106)
Creatinine, Ser: 0.93 mg/dL (ref 0.57–1.00)
GFR calc Af Amer: 90 mL/min/{1.73_m2} (ref >59)
GFR calc non Af Amer: 78 mL/min/{1.73_m2} (ref >59)
Globulin, Total: 2.4 g/dL (ref 1.5–4.5)
Glucose: 81 mg/dL (ref 65–99)
Potassium: 4.5 mmol/L (ref 3.5–5.2)
Sodium: 141 mmol/L (ref 134–144)
Total Protein: 6.4 g/dL (ref 6.0–8.5)

## 2019-07-09 LAB — LIPID PANEL
Chol/HDL Ratio: 3.2 ratio (ref 0.0–4.4)
Cholesterol, Total: 188 mg/dL (ref 100–199)
HDL: 59 mg/dL (ref >39)
LDL Chol Calc (NIH): 108 mg/dL — ABNORMAL HIGH (ref 0–99)
Triglycerides: 117 mg/dL (ref 0–149)
VLDL Cholesterol Cal: 21 mg/dL (ref 5–40)

## 2019-07-09 LAB — HEMOGLOBIN A1C
Est. average glucose Bld gHb Est-mCnc: 103 mg/dL
Hgb A1c MFr Bld: 5.2 % (ref 4.8–5.6)

## 2019-07-09 LAB — VITAMIN D 25 HYDROXY (VIT D DEFICIENCY, FRACTURES): Vit D, 25-Hydroxy: 34.5 ng/mL (ref 30.0–100.0)

## 2019-07-21 ENCOUNTER — Encounter: Payer: Self-pay | Admitting: Internal Medicine

## 2019-07-28 ENCOUNTER — Telehealth: Payer: Self-pay

## 2019-07-28 NOTE — Telephone Encounter (Signed)
PT WAS CONTACTED TO ADVISE THAT HER PHYSICIANS RESULT FORM WAS COMPLETED ON 9/16 AFTER HER PHYSICAL VISIT  BUT THE PAPER NEEDED SIGNATURE FROM PATIENT BEFORE WE COULD FAX THE FORM. PT WAS DUE TO COME SIGN BEFORE 9/30 LVM TO ADV WHAT SHE WOULD LIKE [place] TO DO

## 2019-08-25 ENCOUNTER — Other Ambulatory Visit: Payer: Self-pay | Admitting: Obstetrics and Gynecology

## 2019-09-03 ENCOUNTER — Encounter: Payer: Self-pay | Admitting: Internal Medicine

## 2019-09-11 ENCOUNTER — Encounter: Payer: Self-pay | Admitting: Internal Medicine

## 2019-09-13 ENCOUNTER — Other Ambulatory Visit: Payer: Self-pay | Admitting: Internal Medicine

## 2019-09-13 DIAGNOSIS — Z6839 Body mass index (BMI) 39.0-39.9, adult: Secondary | ICD-10-CM

## 2019-09-13 DIAGNOSIS — E6609 Other obesity due to excess calories: Secondary | ICD-10-CM

## 2019-10-05 ENCOUNTER — Telehealth: Payer: Self-pay

## 2019-10-05 NOTE — Telephone Encounter (Signed)
I called patient to see if she was still taking the saxenda so I can submit the PA she stated she is not taking it anymore and was referred to a weight loss clinic. YRL,RMA

## 2019-10-28 ENCOUNTER — Encounter: Payer: Self-pay | Admitting: Internal Medicine

## 2019-10-28 ENCOUNTER — Telehealth: Payer: Self-pay

## 2019-10-28 NOTE — Telephone Encounter (Signed)
Pt sent message requesting covid testing. Told pt the website to cone to schedule for testing. Pt has possibly been exposed at work. Employer wants her to be tested.

## 2019-12-23 ENCOUNTER — Other Ambulatory Visit: Payer: Self-pay

## 2019-12-23 ENCOUNTER — Ambulatory Visit (INDEPENDENT_AMBULATORY_CARE_PROVIDER_SITE_OTHER): Payer: BC Managed Care – PPO | Admitting: Family Medicine

## 2019-12-23 ENCOUNTER — Encounter (INDEPENDENT_AMBULATORY_CARE_PROVIDER_SITE_OTHER): Payer: Self-pay | Admitting: Family Medicine

## 2019-12-23 VITALS — BP 131/85 | HR 70 | Temp 99.5°F | Ht 66.0 in | Wt 245.0 lb

## 2019-12-23 DIAGNOSIS — R0602 Shortness of breath: Secondary | ICD-10-CM

## 2019-12-23 DIAGNOSIS — F3289 Other specified depressive episodes: Secondary | ICD-10-CM

## 2019-12-23 DIAGNOSIS — R7303 Prediabetes: Secondary | ICD-10-CM

## 2019-12-23 DIAGNOSIS — N393 Stress incontinence (female) (male): Secondary | ICD-10-CM

## 2019-12-23 DIAGNOSIS — E559 Vitamin D deficiency, unspecified: Secondary | ICD-10-CM

## 2019-12-23 DIAGNOSIS — I10 Essential (primary) hypertension: Secondary | ICD-10-CM

## 2019-12-23 DIAGNOSIS — Z0289 Encounter for other administrative examinations: Secondary | ICD-10-CM

## 2019-12-23 DIAGNOSIS — Z6839 Body mass index (BMI) 39.0-39.9, adult: Secondary | ICD-10-CM

## 2019-12-23 DIAGNOSIS — Z9189 Other specified personal risk factors, not elsewhere classified: Secondary | ICD-10-CM

## 2019-12-23 DIAGNOSIS — R5383 Other fatigue: Secondary | ICD-10-CM | POA: Diagnosis not present

## 2019-12-24 LAB — CBC WITH DIFFERENTIAL/PLATELET
Basophils Absolute: 0 10*3/uL (ref 0.0–0.2)
Basos: 1 %
EOS (ABSOLUTE): 0.1 10*3/uL (ref 0.0–0.4)
Eos: 2 %
Hematocrit: 39.2 % (ref 34.0–46.6)
Hemoglobin: 12.5 g/dL (ref 11.1–15.9)
Immature Grans (Abs): 0 10*3/uL (ref 0.0–0.1)
Immature Granulocytes: 0 %
Lymphocytes Absolute: 2.1 10*3/uL (ref 0.7–3.1)
Lymphs: 39 %
MCH: 25.8 pg — ABNORMAL LOW (ref 26.6–33.0)
MCHC: 31.9 g/dL (ref 31.5–35.7)
MCV: 81 fL (ref 79–97)
Monocytes Absolute: 0.4 10*3/uL (ref 0.1–0.9)
Monocytes: 7 %
Neutrophils Absolute: 2.8 10*3/uL (ref 1.4–7.0)
Neutrophils: 51 %
Platelets: 233 10*3/uL (ref 150–450)
RBC: 4.84 x10E6/uL (ref 3.77–5.28)
RDW: 13.8 % (ref 11.7–15.4)
WBC: 5.4 10*3/uL (ref 3.4–10.8)

## 2019-12-24 LAB — COMPREHENSIVE METABOLIC PANEL
ALT: 17 IU/L (ref 0–32)
AST: 17 IU/L (ref 0–40)
Albumin/Globulin Ratio: 1.4 (ref 1.2–2.2)
Albumin: 4 g/dL (ref 3.8–4.8)
Alkaline Phosphatase: 110 IU/L (ref 39–117)
BUN/Creatinine Ratio: 14 (ref 9–23)
BUN: 13 mg/dL (ref 6–20)
Bilirubin Total: 0.3 mg/dL (ref 0.0–1.2)
CO2: 22 mmol/L (ref 20–29)
Calcium: 9.4 mg/dL (ref 8.7–10.2)
Chloride: 103 mmol/L (ref 96–106)
Creatinine, Ser: 0.94 mg/dL (ref 0.57–1.00)
GFR calc Af Amer: 89 mL/min/{1.73_m2} (ref >59)
GFR calc non Af Amer: 77 mL/min/{1.73_m2} (ref >59)
Globulin, Total: 2.8 g/dL (ref 1.5–4.5)
Glucose: 72 mg/dL (ref 65–99)
Potassium: 4.2 mmol/L (ref 3.5–5.2)
Sodium: 137 mmol/L (ref 134–144)
Total Protein: 6.8 g/dL (ref 6.0–8.5)

## 2019-12-24 LAB — LIPID PANEL WITH LDL/HDL RATIO
Cholesterol, Total: 211 mg/dL — ABNORMAL HIGH (ref 100–199)
HDL: 69 mg/dL (ref >39)
LDL Chol Calc (NIH): 128 mg/dL — ABNORMAL HIGH (ref 0–99)
LDL/HDL Ratio: 1.9 ratio (ref 0.0–3.2)
Triglycerides: 78 mg/dL (ref 0–149)
VLDL Cholesterol Cal: 14 mg/dL (ref 5–40)

## 2019-12-24 LAB — T3: T3, Total: 116 ng/dL (ref 71–180)

## 2019-12-24 LAB — HEMOGLOBIN A1C
Est. average glucose Bld gHb Est-mCnc: 105 mg/dL
Hgb A1c MFr Bld: 5.3 % (ref 4.8–5.6)

## 2019-12-24 LAB — VITAMIN B12: Vitamin B-12: 498 pg/mL (ref 232–1245)

## 2019-12-24 LAB — VITAMIN D 25 HYDROXY (VIT D DEFICIENCY, FRACTURES): Vit D, 25-Hydroxy: 29.9 ng/mL — ABNORMAL LOW (ref 30.0–100.0)

## 2019-12-24 LAB — TSH: TSH: 2.22 u[IU]/mL (ref 0.450–4.500)

## 2019-12-24 LAB — FOLATE: Folate: 8.9 ng/mL (ref >3.0)

## 2019-12-24 LAB — T4, FREE: Free T4: 0.97 ng/dL (ref 0.82–1.77)

## 2019-12-24 LAB — INSULIN, RANDOM: INSULIN: 6.4 u[IU]/mL (ref 2.6–24.9)

## 2019-12-24 NOTE — Progress Notes (Signed)
Dear Shannon Chard, MD,   Thank you for referring Shannon Shannon [name] to our clinic. The following note includes my evaluation and treatment recommendations.  Chief Complaint:   OBESITY Shannon Shannon [name] (MR# [id number]) is a 39 y.o. female who presents for evaluation and treatment of obesity and related comorbidities. Current BMI is Body mass index is 39.54 kg/m. Shannon Shannon [name] has been struggling with her weight for many years and has been unsuccessful in either losing weight, maintaining weight loss, or reaching her healthy weight goal.  Shannon Shannon [name] can't eat eggs or yogurt. Shannon [name] is often skipping breakfast. Shannon [name] is doing water in the morning with [place] (iced strawberry) (feels full). No snacks. For lunch, Shannon [name] is doing soup, or healthy choice meals, or a sandwich with [place], mayo, tomatoes, lettuce, and chips (feels full). Around 4pm, Shannon [name] is doing chips or [place]art. For dinner, Shannon [name] is doing 5 oz of meat, 1 cup of vegetables, 1 cup of starch (feels full). For snack, Shannon [name] is doing chips or popcorn.   Shannon Shannon [name] is currently in the action stage of change and ready to dedicate time achieving and maintaining a healthier weight. Shannon Shannon [name] is interested in becoming our patient and working on intensive lifestyle modifications including (but not limited to) diet and exercise for weight loss.  Shannon Shannon [name]'s habits were reviewed today and are as follows: Her family eats meals together, Shannon [name] thinks her family will eat healthier with her, her desired weight loss is 65 lbs, Shannon [name] started gaining weight, as her weight has always fluctuated, her heaviest weight ever was 248 pounds, Shannon [name] has significant food cravings issues, Shannon [name] snacks frequently in the evenings, Shannon [name] wakes up frequently in the middle of the night to eat, Shannon [name] skips meals frequently, Shannon [name] is frequently drinking liquids with calories, Shannon [name] frequently makes poor food choices, Shannon [name] has problems with excessive hunger and Shannon [name] struggles with emotional eating.  Depression Screen Shannon Food  and Shannon [name] (modified PHQ-9) score was 12.  Depression screen PHQ 2/9 12/23/2019  Decreased Interest 2  Down, Depressed, Hopeless 1  PHQ - 2 Score 3  Altered sleeping 2  Tired, decreased energy 2  Change in appetite 1  Feeling bad or failure about yourself  0  Trouble concentrating 3  Moving slowly or fidgety/restless 1  Suicidal thoughts 0  PHQ-9 Score 12  Difficult doing work/chores Somewhat difficult   Subjective:   1. Other fatigue Shannon Shannon [name] admits to daytime somnolence and admits to waking up still tired. Patent has a history of symptoms of daytime fatigue and morning headache. Shannon Shannon [name] generally gets 4 or 6 hours of sleep per night, and states that Shannon [name] has nightime awakenings. Snoring is present. Apneic episodes are present. Epworth Sleepiness Score is 12.  2. Shortness of breath on exertion Shannon Shannon [name] notes increasing shortness of breath with exercising and seems to be worsening over time with weight gain. Shannon [name] notes getting out of breath sooner with activity than Shannon [name] used to. This has not gotten worse recently. Shannon Shannon [name] denies shortness of breath at rest or orthopnea. EKG-normal sinus rhythm at 93 BPM (unchanged from 2014).  3. Pre-diabetes Shannon [name] was diagnosed at 39 years old. Shannon [name] is not on medications. Her recent A1c was 5.2 in September 2020.  4. Essential hypertension Shannon [name] is not on medications (Shannon [name] voices Shannon [name]). Shannon [name] denies chest pain, chest pressure, or headaches. Her blood pressure is controlled today.  5. Vitamin D deficiency Shannon [name] denies nausea, vomiting, or muscle weakness, but Shannon [name] notes fatigue. Her last Vit  D level was 34.5.  6. Stress incontinence Shannon Shannon [name] is needing to wear a pantyliner daily. Shannon [name] notes incontinence with laughing or coughing.  7. Other depression, with emotional eating Shannon Shannon [name] is on Cymbalta 60 mg daily (on prescription for 2 years). Shannon [name] denies suicidal ideas or homicidal ideas.  8. At risk for diabetes mellitus Shannon Shannon [name] is at higher than average  risk for developing diabetes due to her obesity.   Assessment/Plan:   1. Other fatigue Shannon [name] does feel that her weight is causing her energy to be lower than it should be. Fatigue may be related to obesity, depression or many other causes. Labs will be ordered, and in the meanwhile, Shannon [name] will focus on self care including making healthy food choices, increasing physical activity and focusing on stress reduction.  - EKG 12-Lead - Vitamin B12 - CBC with Differential/Platelet - Folate - T3 - T4, free - TSH - Lipid Panel With LDL/HDL Ratio  2. Shortness of breath on exertion Shannon Shannon [name] does feel that Shannon [name] gets out of breath more easily that Shannon [name] used to when Shannon [name] exercises. Shannon Shannon [name]'s shortness of breath appears to be obesity related and exercise induced. Shannon [name] has agreed to work on weight loss and gradually increase exercise to treat her exercise induced shortness of breath. Will continue to monitor closely.  3. Pre-diabetes Shannon [name] will continue to work on weight loss, exercise, and decreasing simple carbohydrates to help decrease the risk of diabetes. We will check labs today.  - Hemoglobin A1c - Insulin, random  4. Essential hypertension Shannon [name] is working on healthy weight loss and exercise to improve blood pressure control. We will watch for signs of hypotension as Shannon [name] continues her lifestyle modifications. We will check labs today.  - Comprehensive metabolic panel  5. Vitamin D deficiency Low Vitamin D level contributes to fatigue and are associated with obesity, breast, and colon cancer. We will check labs today. Shannon Shannon [name] will follow-up for routine testing of Vitamin D, at least 2-3 times per year to avoid over-replacement.  - VITAMIN D 25 Hydroxy (Vit-D Deficiency, Fractures)  6. Stress incontinence We will refer to pelvic floor physical therapy, and will follow up.  - Ambulatory referral to Physical Therapy  7. Other depression, with emotional eating Behavior modification techniques were  discussed today to help Shannon Shannon [name] deal with her emotional/non-hunger eating behaviors. Orders and follow up as documented in patient record.   8. At risk for diabetes mellitus Shannon Shannon [name] was given approximately 15 minutes of diabetes education and counseling today. We discussed intensive lifestyle modifications today with an emphasis on weight loss as well as increasing exercise and decreasing simple carbohydrates in her diet. We also reviewed medication options with an emphasis on risk versus benefit of those discussed.   Repetitive spaced learning was employed today to elicit superior memory formation and behavioral change.  9. Class 2 severe obesity with serious comorbidity and body mass index (BMI) of 39.0 to 39.9 in adult, unspecified obesity type ([place]) Shannon Shannon [name] is currently in the action stage of change and her goal is to continue with weight loss efforts. I recommend Shannon Shannon [name] begin the structured treatment plan as follows:  Shannon [name] has agreed to the Category 3 Plan.  Exercise goals: No exercise has been prescribed at this time.   Behavioral modification strategies: increasing lean protein intake, increasing vegetables, meal planning and cooking strategies, keeping healthy foods in the home and planning for success.  Shannon [name] was informed of the importance of frequent follow-up visits to maximize her success with intensive lifestyle modifications for  her multiple health conditions. Shannon [name] was informed we would discuss her lab results at her next visit unless there is a critical issue that needs to be addressed sooner. Shannon Shannon [name] agreed to keep her next visit at the agreed upon time to discuss these results.  Objective:   Blood pressure 131/85, pulse 70, temperature 99.5 F (37.5 C), temperature source Oral, height 5\' 6"  (1.676 m), weight 245 lb (111.1 kg), last menstrual period 12/07/2019, SpO2 99 %, unknown if currently breastfeeding. Body mass index is 39.54 kg/m.  EKG: Normal sinus rhythm, rate 93 BPM.  Indirect  Calorimeter completed today shows a VO2 of 278 and a REE of 1934.  Her calculated basal metabolic rate is [id number] thus her basal metabolic rate is better than expected.  General: Cooperative, alert, well developed, in no acute distress. HEENT: Conjunctivae and lids unremarkable. Cardiovascular: Regular rhythm.  Lungs: Normal work of breathing. Neurologic: No focal deficits.   Lab Results  Component Value Date   CREATININE 0.94 12/23/2019   BUN 13 12/23/2019   NA 137 12/23/2019   K 4.2 12/23/2019   CL 103 12/23/2019   CO2 22 12/23/2019   Lab Results  Component Value Date   ALT 17 12/23/2019   AST 17 12/23/2019   ALKPHOS 110 12/23/2019   BILITOT 0.3 12/23/2019   Lab Results  Component Value Date   HGBA1C 5.3 12/23/2019   HGBA1C 5.2 07/08/2019   HGBA1C 5.1 08/19/2018   HGBA1C 5.7 (H) 11/20/2012   Lab Results  Component Value Date   INSULIN WILL FOLLOW 12/23/2019   Lab Results  Component Value Date   TSH 2.220 12/23/2019   Lab Results  Component Value Date   CHOL 211 (H) 12/23/2019   HDL 69 12/23/2019   LDLCALC 128 (H) 12/23/2019   TRIG 78 12/23/2019   CHOLHDL 3.2 07/08/2019   Lab Results  Component Value Date   WBC 5.4 12/23/2019   HGB 12.5 12/23/2019   HCT 39.2 12/23/2019   MCV 81 12/23/2019   PLT 233 12/23/2019   No results found for: IRON, TIBC, FERRITIN  Attestation Statements:   This is the patient's first visit at [facility]. The patient's NEW PATIENT PACKET was reviewed at length. Included in the packet: current and past health history, medications, allergies, ROS, gynecologic history (women only), surgical history, family history, social history, weight history, weight loss surgery history (for those that have had weight loss surgery), nutritional evaluation, Shannon [name] and food questionnaire, PHQ9, Epworth questionnaire, sleep habits questionnaire, patient life and health improvement goals questionnaire. These will all be scanned into the  patient's chart under media.   During the visit, I independently reviewed the patient's EKG, bioimpedance scale results, and indirect calorimeter results. I used this information to tailor a meal plan for the patient that will help her to lose weight and will improve her obesity-related conditions going forward. I performed a medically necessary appropriate examination and/or evaluation. I discussed the assessment and treatment plan with the patient. The patient was provided an opportunity to ask questions and all were answered. The patient agreed with the plan and demonstrated an understanding of the instructions. Labs were ordered at this visit and will be reviewed at the next visit unless more critical results need to be addressed immediately. Clinical information was updated and documented in the EMR.   Time spent on visit including pre-visit chart review and post-visit care was 50 minutes.   A separate 15 minutes was spent on risk counseling (see above).  I, Trixie Dredge, am acting as transcriptionist for Ilene Qua, MD.  I have reviewed the above documentation for accuracy and completeness, and I agree with the above. - Ilene Qua, MD

## 2019-12-29 ENCOUNTER — Encounter (HOSPITAL_COMMUNITY): Payer: Self-pay | Admitting: Physical Therapy

## 2019-12-29 ENCOUNTER — Other Ambulatory Visit: Payer: Self-pay

## 2019-12-29 ENCOUNTER — Ambulatory Visit (HOSPITAL_COMMUNITY): Payer: BC Managed Care – PPO | Attending: Family Medicine | Admitting: Physical Therapy

## 2019-12-29 DIAGNOSIS — N3946 Mixed incontinence: Secondary | ICD-10-CM | POA: Diagnosis present

## 2019-12-29 NOTE — Therapy (Signed)
[place], [place], [address] Phone: [phone]   Fax:  [phone]  Physical Therapy Evaluation  Patient Details  Name: Shannon [name] MRN: [id number] Date of Birth: 09/10/1981 Referring Provider (PT): Dellia Cloud   Encounter Date: 12/29/2019  PT End of Session - 12/29/19 1627    Visit Number  1    Number of Visits  4    Date for PT Re-Evaluation  01/26/20    Authorization Type  BCBS    Authorization - Visit Number  1    Authorization - Number of Visits  4    Progress Note Due on Visit  4    PT Start Time  [id number]    PT Stop Time  1620    PT Time Calculation (min)  40 min    Activity Tolerance  Patient tolerated treatment well    Behavior During Therapy  [facility] for tasks assessed/performed       Past Medical History:  Diagnosis Date  . ADD (attention deficit disorder)   . Anemia   . Anxiety   . Asthma   . Back pain   . Bilateral swelling of feet   . Chest pain   . Constipation   . Depression   . Hyperlipidemia   . Hypertension    Preeclampsia post delivery  . Irregular heart beat   . Joint pain   . Multiple food allergies   . Obesity   . Pre-diabetes   . Shortness of breath   . Vitamin D deficiency     Past Surgical History:  Procedure Laterality Date  . DILATATION & CURRETTAGE/HYSTEROSCOPY WITH RESECTOCOPE N/A 02/03/2014   Procedure: DILATATION & CURETTAGE/HYSTEROSCOPY ;  Surgeon: Delice Lesch, MD;  Location: [facility];  Service: Gynecology;  Laterality: N/A;  . MYOMECTOMY N/A 02/03/2014   Procedure: MYOMECTOMY ;  Surgeon: Delice Lesch, MD;  Location: [facility];  Service: Gynecology;  Laterality: N/A;  . SALPINGECTOMY    . tonsils and adnoids removed    . TUBAL LIGATION N/A 08/19/2015   Procedure: POST PARTUM TUBAL LIGATION;  Surgeon: Everett Graff, MD;  Location: [facility];  Service: Gynecology;  Laterality: N/A;  . WISDOM TOOTH EXTRACTION      There were no vitals filed for this  visit.   Subjective Assessment - 12/29/19 1548    Subjective  Shannon [name] states that she began having incontinence after her second child was born 08/18/2015.  Her incontinence has increased throughout the years.  At this time she is doing the kegals.    Currently in Pain?  No/denies         [employer] PT Assessment - 12/29/19 0001      Assessment   Medical Diagnosis  stress/urge incontinence     Referring Provider (PT)  Dellia Cloud    Prior Therapy  none       Precautions   Precautions  None      Balance Screen   Has the patient fallen in the past 6 months  No    Has the patient had a decrease in activity level because of a fear of falling?   No    Is the patient reluctant to leave their home because of a fear of falling?   No      Cognition   Overall Cognitive Status  Within Functional Limits for tasks assessed                Objective  measurements completed on examination: See above findings.    Pelvic Floor Special Questions - 12/29/19 0001    Are you Pregnant or attempting pregnancy?  No    Prior Pregnancies  Yes    Number of Pregnancies  3    Number of Vaginal Deliveries  2    Episiotomy Performed  --   tore with first child;    Urinary Leakage  Yes    How often  everyday     Pad use  3    Activities that cause leaking  Coughing;Sneezing;Laughing;Lifting;Bending;Running;Exercising;Intercourse    Urinary urgency  Yes    Urinary frequency  4-6    Fecal incontinence  No    Caffeine beverages  2     Falling out feeling (prolapse)  Yes       [facility] Adult PT Treatment/Exercise - 12/29/19 0001      Exercises   Exercises  Lumbar      Lumbar Exercises: Seated   Other Seated Lumbar Exercises  quick kegal: 2 hold/4" rest x 10     Other Seated Lumbar Exercises  long kegal: 20 sec hold 40 second relax x 10       Lumbar Exercises: Supine   Heel Slides  10 reps    Heel Slides Limitations  B Leg off floor      Lumbar Exercises: Quadruped   Other  Quadruped Lumbar Exercises  ab set x 10" x 10              PT Education - 12/29/19 1627    Education Details  HEP    Person(s) Educated  Patient    Methods  Explanation;Handout    Comprehension  Returned demonstration;Verbalized understanding       PT Short Term Goals - 12/29/19 1633      PT SHORT TERM GOAL #1   Title  PT to be completing HEP to improve mm strength in pelvic and abdominal area.    Time  10    Period  Days    Status  New    Target Date  01/08/20      PT SHORT TERM GOAL #2   Title  PT to only be using one pad a day    Time  10    Period  Days    Status  New        PT Long Term Goals - 12/29/19 1634      PT LONG TERM GOAL #1   Title  PT to be I in advance HEP to only be having one episode of incontinence a day. And to feel confident in continued program to become dry in 3-6 months.    Time  4    Period  Weeks    Status  New    Target Date  01/26/20      PT LONG TERM GOAL #2   Title  PT to be using one minipade a day    Time  4    Period  Weeks    Status  New             Plan - 12/29/19 1628    Clinical Impression Statement  PT is a 39 yo female who has had incontinence issues since the birth of her second child.  The incontinence is getting worse and she needs to wear approximately 3 pads a day.  Ms. Vink demonstrates both urge and stress incontinence.  She will benefit from skilled PT to improve her  pelvic and abdominal strength to decrease her incontinence issues.    Stability/Clinical Decision Making  Evolving/Moderate complexity    Clinical Decision Making  Low    Rehab Potential  Good    PT Frequency  1x / week    PT Duration  4 weeks    PT Treatment/Interventions  Patient/family education;Therapeutic exercise;Therapeutic activities    PT Next Visit Plan  begin sidelying abduction, increase long holds if able, begin quadriped single leg  attempt oppositearm/leg    PT Home Exercise Plan  kegal, heelslides quadriped ab set.        Patient will benefit from skilled therapeutic intervention in order to improve the following deficits and impairments:  Postural dysfunction, Decreased strength  Visit Diagnosis: Mixed incontinence     Problem List Patient Active Problem List   Diagnosis Date Noted  . Elevated blood-pressure reading without diagnosis of hypertension 05/26/2019  . Class 2 obesity due to excess calories without serious comorbidity with body mass index (BMI) of 39.0 to 39.9 in adult 05/26/2019  . Headache disorder 08/28/2015  . Pre-eclampsia in postpartum period 08/26/2015  . Susceptible to varicella (non-immune), currently pregnant 08/18/2015  . History of myomectomy 08/18/2015  . Latex allergy 08/18/2015  . Hypertensive disorder-resolved with weight loss 08/18/2015  . SVD (spontaneous vaginal delivery) 08/18/2015  . Depression 01/19/2013  . Vitamin D deficiency 11/21/2012  . OVERWEIGHT 03/25/2009  . BACK PAIN WITH RADICULOPATHY 03/23/2009  . ASTHMA, UNSPECIFIED, UNSPECIFIED STATUS 10/03/2008    Rayetta Humphrey, PT CLT [phone] 12/29/2019, 4:37 PM  [facility] [address] [place], [place], [address] Phone: [phone]   Fax:  [phone]  Name: Marti Mccallie MRN: [id number] Date of Birth: 02-13-1981

## 2020-01-05 ENCOUNTER — Ambulatory Visit (HOSPITAL_COMMUNITY): Payer: BC Managed Care – PPO | Admitting: Physical Therapy

## 2020-01-05 ENCOUNTER — Telehealth (HOSPITAL_COMMUNITY): Payer: Self-pay | Admitting: Physical Therapy

## 2020-01-05 NOTE — Telephone Encounter (Signed)
pt cancelled appt today because she is dealing with a crisis at work

## 2020-01-06 ENCOUNTER — Ambulatory Visit (INDEPENDENT_AMBULATORY_CARE_PROVIDER_SITE_OTHER): Payer: BC Managed Care – PPO | Admitting: Family Medicine

## 2020-01-06 ENCOUNTER — Encounter (INDEPENDENT_AMBULATORY_CARE_PROVIDER_SITE_OTHER): Payer: Self-pay

## 2020-01-13 ENCOUNTER — Telehealth (HOSPITAL_COMMUNITY): Payer: Self-pay | Admitting: Physical Therapy

## 2020-01-13 NOTE — Telephone Encounter (Signed)
pt cancelled her remaining appts because she said her MD wants to go a different route

## 2020-01-14 ENCOUNTER — Ambulatory Visit (HOSPITAL_COMMUNITY): Payer: BC Managed Care – PPO | Admitting: Physical Therapy

## 2020-01-19 ENCOUNTER — Ambulatory Visit (HOSPITAL_COMMUNITY): Payer: BC Managed Care – PPO | Admitting: Physical Therapy

## 2020-01-28 ENCOUNTER — Encounter (HOSPITAL_COMMUNITY): Payer: BC Managed Care – PPO | Admitting: Physical Therapy

## 2020-02-05 ENCOUNTER — Ambulatory Visit: Payer: BC Managed Care – PPO | Attending: Internal Medicine

## 2020-02-05 DIAGNOSIS — Z23 Encounter for immunization: Secondary | ICD-10-CM

## 2020-02-05 NOTE — Progress Notes (Signed)
   Covid-19 Vaccination Clinic  Name:  Shannon [name]    MRN: [id number] DOB: 1980-12-19  02/05/2020  Shannon [name] was observed post Covid-19 immunization for 15 minutes .  During the observation period, she experienced an adverse reaction with the following symptoms:  nausea and flushed, lightheaded.  Assessment : Time of assessment 1250. Alert and oriented and Nausea. Patient reports as soon as she sat down after receiving the vaccine, she started feeling flushed, nausea and lightheaded. She walked out to the triage area. She asked for water and says when she drank the water, the nausea went away. After 5 minutes sitting, she reports the feeling of flushed and lightheaded went away. After additional observation, she ambulated without difficulty and was released to go home.  Actions taken:  Vitals sign taken  VAERS form obtained and completed by Janan Ridge, RN. 1250-BP: 148/101, P 102, 100% Oxygen Sat; 1302-BP: 146/99, P 77 (no symptoms at this time); 1310: BP 144/100 P 93 (no symptoms at this time).  There were no vitals filed for this visit.  Medications administered: No medication administered.  Disposition: Reports no further symptoms of adverse reaction after observation for 20 minutes minutes. Discharged home. Instructed to follow up with PCP  for evaluation for second dose. Instructed to call 911 for trouble breathing, rapid heart rate, dizziness, swelling of tongue or throat.   Immunizations Administered    Name Date Dose VIS Date Route   Moderna COVID-19 Vaccine 02/05/2020 12:47 PM 0.5 mL 09/22/2019 Intramuscular   Manufacturer: Moderna   Lot: [id number]   [id number]

## 2020-02-08 ENCOUNTER — Encounter: Payer: Self-pay | Admitting: Internal Medicine

## 2020-02-11 ENCOUNTER — Telehealth: Payer: Self-pay | Admitting: *Deleted

## 2020-02-11 NOTE — Telephone Encounter (Signed)
Called and spoke with the patient in regards to the first [place] vaccine that she received on 02/05/2020. She stated that shortly after she started to experience nausea and flushing and her BP became elevated. She was not administered any medication and was released when she felt better, she did not return to work until Tuesday 4/20 due to still having symptoms of nausea, feeling drained and sluggish and the whole left side of her body felt heavy like cement. Her stated that her BP was elevated until this past Wednesday. She also reports a rash on her stomach that started Saturday and is still present, she states that it has not worsened but is itchy. She stated that after the vaccine she did experience a tingling feeling in her mouth that she describes as eating a sour piece of candy. Please if there is any need for concern before the patient receives her second vaccine. Thank You.

## 2020-02-11 NOTE — Telephone Encounter (Signed)
Can we go ahead and schedule her for COVID vaccine component testing?  Well the reactions lasted longer than one would expect an IgE mediated reaction to last, they did start very quickly.   Salvatore Marvel, MD [facility]

## 2020-02-11 NOTE — Telephone Encounter (Signed)
Attempted to call patient to discuss COVID component testing and to get her scheduled. There was no answer and the mailbox was full. Will attempt to call again.

## 2020-02-12 NOTE — Telephone Encounter (Signed)
Called and spoke with patient and she stated that she had to hang up because she had an emergency situation to deal with at work. She stated that she will call back shortly to schedule that new patient appointment.

## 2020-02-12 NOTE — Telephone Encounter (Signed)
Called and spoke with the patient and went over component testing. She verbalized understanding and would like to come in first and be seen as a new patient first to discuss the component testing more in detail. Patient has been scheduled to come in for a new patient appointment. Advised to patient that we will hold off on her receiving her next vaccine for now until we have a plan for her. Patient verbalized understanding.

## 2020-02-12 NOTE — Telephone Encounter (Signed)
Called patient to schedule initial appointment, unable to leave message-mailbox full.

## 2020-02-12 NOTE — Telephone Encounter (Signed)
Awesome. Thanks for working together to take care of patients.  Salvatore Marvel, MD [facility]

## 2020-02-17 NOTE — Telephone Encounter (Signed)
Called patient to follow up on scheduling initial appointment. Unable to leave message-mailbox.

## 2020-03-08 ENCOUNTER — Ambulatory Visit: Payer: BC Managed Care – PPO | Attending: Internal Medicine

## 2020-04-14 LAB — HM PAP SMEAR

## 2020-04-20 ENCOUNTER — Encounter: Payer: Self-pay | Admitting: Internal Medicine

## 2020-07-18 ENCOUNTER — Encounter: Payer: Self-pay | Admitting: Internal Medicine

## 2020-08-08 ENCOUNTER — Other Ambulatory Visit: Payer: Self-pay

## 2020-08-08 ENCOUNTER — Ambulatory Visit: Payer: BC Managed Care – PPO | Admitting: Internal Medicine

## 2020-08-08 ENCOUNTER — Encounter: Payer: Self-pay | Admitting: Internal Medicine

## 2020-08-08 VITALS — BP 128/76 | HR 108 | Temp 98.2°F | Ht 65.0 in | Wt 242.8 lb

## 2020-08-08 DIAGNOSIS — N393 Stress incontinence (female) (male): Secondary | ICD-10-CM

## 2020-08-08 DIAGNOSIS — Z6839 Body mass index (BMI) 39.0-39.9, adult: Secondary | ICD-10-CM | POA: Diagnosis not present

## 2020-08-08 DIAGNOSIS — E66812 Obesity, class 2: Secondary | ICD-10-CM

## 2020-08-08 DIAGNOSIS — R000 Tachycardia, unspecified: Secondary | ICD-10-CM | POA: Diagnosis not present

## 2020-08-08 DIAGNOSIS — E6609 Other obesity due to excess calories: Secondary | ICD-10-CM | POA: Diagnosis not present

## 2020-08-08 DIAGNOSIS — Z0000 Encounter for general adult medical examination without abnormal findings: Secondary | ICD-10-CM | POA: Diagnosis not present

## 2020-08-08 DIAGNOSIS — R32 Unspecified urinary incontinence: Secondary | ICD-10-CM

## 2020-08-08 LAB — POCT URINALYSIS DIPSTICK
Bilirubin, UA: NEGATIVE
Glucose, UA: NEGATIVE
Ketones, UA: NEGATIVE
Leukocytes, UA: NEGATIVE
Nitrite, UA: NEGATIVE
Protein, UA: NEGATIVE
Spec Grav, UA: 1.02 (ref 1.010–1.025)
Urobilinogen, UA: 0.2 E.U./dL
pH, UA: 5.5 (ref 5.0–8.0)

## 2020-08-08 NOTE — Progress Notes (Signed)
I,Katawbba Wiggins,acting as a Education administrator for Maximino Greenland, MD.,have documented all relevant documentation on the behalf of Maximino Greenland, MD,as directed by  Maximino Greenland, MD while in the presence of Maximino Greenland, MD.  This visit occurred during the SARS-CoV-2 public health emergency.  Safety protocols were in place, including screening questions prior to the visit, additional usage of staff PPE, and extensive cleaning of exam room while observing appropriate contact time as indicated for disinfecting solutions.  Subjective:     Patient ID: Shannon [name] , female    DOB: 10/06/1981 , 39 y.o.   MRN: [id number]   Chief Complaint  Patient presents with  . Annual Exam    HPI  She is here today for a full physical examination. She has her pap smears performed by Dr. Everett Graff. She has been seen in the past year. She recently finished graduate school and now she is a Management consultant. She reports enjoying her job and working with her students.     Past Medical History:  Diagnosis Date  . ADD (attention deficit disorder)   . Anemia   . Anxiety   . Asthma   . Back pain   . Bilateral swelling of feet   . Chest pain   . Constipation   . Depression   . Hyperlipidemia   . Hypertension    Preeclampsia post delivery  . Irregular heart beat   . Joint pain   . Multiple food allergies   . Obesity   . Pre-diabetes   . Shortness of breath   . Vitamin D deficiency      Family History  Problem Relation Age of Onset  . Hypertension Mother   . Diabetes Mother   . Mental illness Mother   . Hyperlipidemia Mother   . Stroke Mother   . Depression Mother   . Anxiety disorder Mother   . Bipolar disorder Mother   . Liver disease Mother   . Sleep apnea Mother   . Obesity Mother   . Hypertension Father   . Prostate cancer Father   . Asthma Brother   . Heart disease Maternal Grandmother   . Hypertension Maternal Grandmother   . Cancer Paternal Grandmother        breast  cancer  . Seizures Brother      Current Outpatient Medications:  .  albuterol (PROVENTIL HFA;VENTOLIN HFA) 108 (90 Base) MCG/ACT inhaler, Inhale 2 puffs into the lungs every 6 (six) hours as needed for wheezing or shortness of breath., Disp: 1 Inhaler, Rfl: 3 .  DULoxetine (CYMBALTA) 60 MG capsule, Take 1 capsule by mouth daily. , Disp: , Rfl: 1 .  lisdexamfetamine (VYVANSE) 60 MG capsule, Take 30 mg by mouth daily. , Disp: , Rfl:    Allergies  Allergen Reactions  . Diclofenac Rash  . Pineapple Shortness Of Breath  . Ceftriaxone Sodium Hives  . Diflucan [Fluconazole] Hives    Raw rash on abdomen and she had sores and swelling in mouth   . Eggs Or Egg-Derived Products Nausea And Vomiting    Can eat cake made with eggs  . Bactrim [Sulfamethoxazole-Trimethoprim] Hives  . Latex Rash      The patient states she uses none for birth control. Last LMP was Patient's last menstrual period was 08/05/2020.. Negative for Dysmenorrhea. Negative for: breast discharge, breast lump(s), breast pain and breast self exam. Associated symptoms include abnormal vaginal bleeding. Pertinent negatives include abnormal bleeding (hematology), anxiety, decreased libido, depression, difficulty  falling sleep, dyspareunia, history of infertility, nocturia, sexual dysfunction, sleep disturbances, urinary incontinence, urinary urgency, vaginal discharge and vaginal itching. Diet regular.The patient states her exercise level is  intermittent.   . The patient's tobacco use is:  Social History   Tobacco Use  Smoking Status Never Smoker  Smokeless Tobacco Never Used  . She has been exposed to passive smoke. The patient's alcohol use is:  Social History   Substance and Sexual Activity  Alcohol Use No   Comment: occasionally     Review of Systems  Constitutional: Negative.   HENT: Negative.   Eyes: Negative.   Respiratory: Negative.   Cardiovascular: Negative.   Gastrointestinal: Negative.   Endocrine:  Negative.   Genitourinary: Negative.        She c/o bladder leakage with coughing, sneezing. Has been evaluated by her GYN. She has workup scheduled in the near future.   Musculoskeletal: Negative.   Skin: Negative.   Allergic/Immunologic: Negative.   Neurological: Negative.   Hematological: Negative.   Psychiatric/Behavioral: Negative.   All other systems reviewed and are negative.    Today's Vitals   08/08/20 1559  BP: 128/76  Pulse: (!) 108  Temp: 98.2 F (36.8 C)  TempSrc: Oral  Weight: 242 lb 12.8 oz (110.1 kg)  Height: _0  (1.651 m)   Body mass index is 40.4 kg/m.  Wt Readings from Last 3 Encounters:  08/08/20 242 lb 12.8 oz (110.1 kg)  12/23/19 245 lb (111.1 kg)  07/08/19 235 lb 3.2 oz (106.7 kg)   Objective:  Physical Exam Vitals and nursing note reviewed.  Constitutional:      General: She is not in acute distress.    Appearance: Normal appearance. She is well-developed. She is obese.  HENT:     Head: Normocephalic and atraumatic.     Right Ear: Hearing, tympanic membrane, ear canal and external ear normal. There is no impacted cerumen.     Left Ear: Hearing, tympanic membrane, ear canal and external ear normal. There is no impacted cerumen.     Nose:     Comments: Deferred, masked    Mouth/Throat:     Comments: Deferred, masked Eyes:     General: Lids are normal.     Extraocular Movements: Extraocular movements intact.     Conjunctiva/sclera: Conjunctivae normal.     Pupils: Pupils are equal, round, and reactive to light.     Funduscopic exam:    Right eye: No papilledema.        Left eye: No papilledema.  Neck:     Thyroid: No thyroid mass.     Vascular: No carotid bruit.  Cardiovascular:     Rate and Rhythm: Normal rate and regular rhythm.     Pulses: Normal pulses.     Heart sounds: Normal heart sounds. No murmur heard.   Pulmonary:     Effort: Pulmonary effort is normal.     Breath sounds: Normal breath sounds.  Chest:     Breasts: Tanner  Score is 5.        Right: Normal.        Left: Normal.  Abdominal:     General: Bowel sounds are normal. There is no distension.     Palpations: Abdomen is soft.     Tenderness: There is no abdominal tenderness.  Genitourinary:    Comments: Deferred Musculoskeletal:        General: No swelling. Normal range of motion.     Cervical back: Full passive range  of motion without pain, normal range of motion and neck supple.     Right lower leg: No edema.     Left lower leg: No edema.  Skin:    General: Skin is warm and dry.     Capillary Refill: Capillary refill takes less than 2 seconds.  Neurological:     General: No focal deficit present.     Mental Status: She is alert and oriented to person, place, and time.     Cranial Nerves: No cranial nerve deficit.     Sensory: No sensory deficit.  Psychiatric:        Mood and Affect: Mood normal.        Behavior: Behavior normal.        Thought Content: Thought content normal.        Judgment: Judgment normal.         Assessment And Plan:     1. Encounter for annual physical exam Comments: A full exam was performed. Importance of monthly self breast exams was discussed with the patient. PATIENT IS ADVISED TO GET 30-45 MINUTES REGULAR EXERCISE NO LESS THAN FOUR TO FIVE DAYS PER WEEK - BOTH WEIGHTBEARING EXERCISES AND AEROBIC ARE RECOMMENDED.  PATIENT IS ADVISED TO FOLLOW A HEALTHY DIET WITH AT LEAST SIX FRUITS/VEGGIES PER DAY, DECREASE INTAKE OF RED MEAT, AND TO INCREASE FISH INTAKE TO TWO DAYS PER WEEK.  MEATS/FISH SHOULD NOT BE FRIED, BAKED OR BROILED IS PREFERABLE.  I SUGGEST WEARING SPF 50 SUNSCREEN ON EXPOSED PARTS AND ESPECIALLY WHEN IN THE DIRECT SUNLIGHT FOR AN EXTENDED PERIOD OF TIME.  PLEASE AVOID FAST FOOD RESTAURANTS AND INCREASE YOUR WATER INTAKE.  - Hemoglobin A1c - Hepatitis C antibody - CMP14+EGFR - Lipid panel - CBC no Diff  2. Stress incontinence  She will have further workup by Dr. Mancel Bale. I will check urinalysis  today to r/o UTI as a cause for her symptoms.  - POCT Urinalysis Dipstick (81002)  3. Tachycardia Comments: Encouraged to increase water intake and limit caffeinated beverages. She may benefit from magnesium supplementation.   4. Class 2 obesity due to excess calories without serious comorbidity with body mass index (BMI) of 39.0 to 39.9 in adult Comments: She is encouraged to strive for BMI less than 30 to decrease cardiac risk. Advised to aim for at least 150 minutes of exercise per week.     Patient was given opportunity to ask questions. Patient verbalized understanding of the plan and was able to repeat key elements of the plan. All questions were answered to their satisfaction.   Maximino Greenland, MD   I, Maximino Greenland, MD, have reviewed all documentation for this visit. The documentation on 08/13/20 for the exam, diagnosis, procedures, and orders are all accurate and complete.  THE PATIENT IS ENCOURAGED TO PRACTICE SOCIAL DISTANCING DUE TO THE COVID-19 PANDEMIC.

## 2020-08-08 NOTE — Patient Instructions (Signed)
Health Maintenance, Female Adopting a healthy lifestyle and getting preventive care are important in promoting health and wellness. Ask your health care provider about:  The right schedule for you to have regular tests and exams.  Things you can do on your own to prevent diseases and keep yourself healthy. What should I know about diet, weight, and exercise? Eat a healthy diet   Eat a diet that includes plenty of vegetables, fruits, low-fat dairy products, and lean protein.  Do not eat a lot of foods that are high in solid fats, added sugars, or sodium. Maintain a healthy weight Body mass index (BMI) is used to identify weight problems. It estimates body fat based on height and weight. Your health care provider can help determine your BMI and help you achieve or maintain a healthy weight. Get regular exercise Get regular exercise. This is one of the most important things you can do for your health. Most adults should:  Exercise for at least 150 minutes each week. The exercise should increase your heart rate and make you sweat (moderate-intensity exercise).  Do strengthening exercises at least twice a week. This is in addition to the moderate-intensity exercise.  Spend less time sitting. Even light physical activity can be beneficial. Watch cholesterol and blood lipids Have your blood tested for lipids and cholesterol at 39 years of age, then have this test every 5 years. Have your cholesterol levels checked more often if:  Your lipid or cholesterol levels are high.  You are older than 40 years of age.  You are at high risk for heart disease. What should I know about cancer screening? Depending on your health history and family history, you may need to have cancer screening at various ages. This may include screening for:  Breast cancer.  Cervical cancer.  Colorectal cancer.  Skin cancer.  Lung cancer. What should I know about heart disease, diabetes, and high blood  pressure? Blood pressure and heart disease  High blood pressure causes heart disease and increases the risk of stroke. This is more likely to develop in people who have high blood pressure readings, are of African descent, or are overweight.  Have your blood pressure checked: ? Every 3-5 years if you are 18-39 years of age. ? Every year if you are 40 years old or older. Diabetes Have regular diabetes screenings. This checks your fasting blood sugar level. Have the screening done:  Once every three years after age 40 if you are at a normal weight and have a low risk for diabetes.  More often and at a younger age if you are overweight or have a high risk for diabetes. What should I know about preventing infection? Hepatitis B If you have a higher risk for hepatitis B, you should be screened for this virus. Talk with your health care provider to find out if you are at risk for hepatitis B infection. Hepatitis C Testing is recommended for:  Everyone born from 1945 through 1965.  Anyone with known risk factors for hepatitis C. Sexually transmitted infections (STIs)  Get screened for STIs, including gonorrhea and chlamydia, if: ? You are sexually active and are younger than 39 years of age. ? You are older than 39 years of age and your health care provider tells you that you are at risk for this type of infection. ? Your sexual activity has changed since you were last screened, and you are at increased risk for chlamydia or gonorrhea. Ask your health care provider if   you are at risk.  Ask your health care provider about whether you are at high risk for HIV. Your health care provider may recommend a prescription medicine to help prevent HIV infection. If you choose to take medicine to prevent HIV, you should first get tested for HIV. You should then be tested every 3 months for as long as you are taking the medicine. Pregnancy  If you are about to stop having your period (premenopausal) and  you may become pregnant, seek counseling before you get pregnant.  Take 400 to 800 micrograms (mcg) of folic acid every day if you become pregnant.  Ask for birth control (contraception) if you want to prevent pregnancy. Osteoporosis and menopause Osteoporosis is a disease in which the bones lose minerals and strength with aging. This can result in bone fractures. If you are 65 years old or older, or if you are at risk for osteoporosis and fractures, ask your health care provider if you should:  Be screened for bone loss.  Take a calcium or vitamin D supplement to lower your risk of fractures.  Be given hormone replacement therapy (HRT) to treat symptoms of menopause. Follow these instructions at home: Lifestyle  Do not use any products that contain nicotine or tobacco, such as cigarettes, e-cigarettes, and chewing tobacco. If you need help quitting, ask your health care provider.  Do not use street drugs.  Do not share needles.  Ask your health care provider for help if you need support or information about quitting drugs. Alcohol use  Do not drink alcohol if: ? Your health care provider tells you not to drink. ? You are pregnant, may be pregnant, or are planning to become pregnant.  If you drink alcohol: ? Limit how much you use to 0-1 drink a day. ? Limit intake if you are breastfeeding.  Be aware of how much alcohol is in your drink. In the [place] one drink equals one 12 oz bottle of beer (355 mL), one 5 oz glass of wine (148 mL), or one 1 oz glass of hard liquor (44 mL). General instructions  Schedule regular health, dental, and eye exams.  Stay current with your vaccines.  Tell your health care provider if: ? You often feel depressed. ? You have ever been abused or do not feel safe at home. Summary  Adopting a healthy lifestyle and getting preventive care are important in promoting health and wellness.  Follow your health care provider's instructions about healthy  diet, exercising, and getting tested or screened for diseases.  Follow your health care provider's instructions on monitoring your cholesterol and blood pressure. This information is not intended to replace advice given to you by your health care provider. Make sure you discuss any questions you have with your health care provider. Document Revised: 10/01/2018 Document Reviewed: 10/01/2018 Elsevier Patient Education  2020 Elsevier Inc.  

## 2020-08-09 LAB — CMP14+EGFR
ALT: 13 IU/L (ref 0–32)
AST: 18 IU/L (ref 0–40)
Albumin/Globulin Ratio: 1.6 (ref 1.2–2.2)
Albumin: 4.2 g/dL (ref 3.8–4.8)
Alkaline Phosphatase: 135 IU/L — ABNORMAL HIGH (ref 44–121)
BUN/Creatinine Ratio: 10 (ref 9–23)
BUN: 9 mg/dL (ref 6–20)
Bilirubin Total: 0.2 mg/dL (ref 0.0–1.2)
CO2: 26 mmol/L (ref 20–29)
Calcium: 9.7 mg/dL (ref 8.7–10.2)
Chloride: 102 mmol/L (ref 96–106)
Creatinine, Ser: 0.91 mg/dL (ref 0.57–1.00)
GFR calc Af Amer: 92 mL/min/{1.73_m2} (ref >59)
GFR calc non Af Amer: 80 mL/min/{1.73_m2} (ref >59)
Globulin, Total: 2.7 g/dL (ref 1.5–4.5)
Glucose: 89 mg/dL (ref 65–99)
Potassium: 4.2 mmol/L (ref 3.5–5.2)
Sodium: 140 mmol/L (ref 134–144)
Total Protein: 6.9 g/dL (ref 6.0–8.5)

## 2020-08-09 LAB — CBC
Hematocrit: 37.2 % (ref 34.0–46.6)
Hemoglobin: 12.3 g/dL (ref 11.1–15.9)
MCH: 26.1 pg — ABNORMAL LOW (ref 26.6–33.0)
MCHC: 33.1 g/dL (ref 31.5–35.7)
MCV: 79 fL (ref 79–97)
Platelets: 263 10*3/uL (ref 150–450)
RBC: 4.72 x10E6/uL (ref 3.77–5.28)
RDW: 13 % (ref 11.7–15.4)
WBC: 6.6 10*3/uL (ref 3.4–10.8)

## 2020-08-09 LAB — HEPATITIS C ANTIBODY: Hep C Virus Ab: <0.1 s/co ratio (ref 0.0–0.9)

## 2020-08-09 LAB — HEMOGLOBIN A1C
Est. average glucose Bld gHb Est-mCnc: 108 mg/dL
Hgb A1c MFr Bld: 5.4 % (ref 4.8–5.6)

## 2020-08-09 LAB — LIPID PANEL
Chol/HDL Ratio: 3.3 ratio (ref 0.0–4.4)
Cholesterol, Total: 221 mg/dL — ABNORMAL HIGH (ref 100–199)
HDL: 68 mg/dL (ref >39)
LDL Chol Calc (NIH): 134 mg/dL — ABNORMAL HIGH (ref 0–99)
Triglycerides: 108 mg/dL (ref 0–149)
VLDL Cholesterol Cal: 19 mg/dL (ref 5–40)

## 2020-08-13 ENCOUNTER — Encounter: Payer: Self-pay | Admitting: Internal Medicine

## 2020-10-22 DIAGNOSIS — K219 Gastro-esophageal reflux disease without esophagitis: Secondary | ICD-10-CM

## 2020-10-22 HISTORY — DX: Gastro-esophageal reflux disease without esophagitis: K21.9

## 2020-11-17 ENCOUNTER — Other Ambulatory Visit: Payer: Self-pay

## 2020-11-17 ENCOUNTER — Encounter: Payer: Self-pay | Admitting: Emergency Medicine

## 2020-11-17 ENCOUNTER — Ambulatory Visit
Admission: EM | Admit: 2020-11-17 | Discharge: 2020-11-17 | Disposition: A | Discharge status: Home / Self Care | Payer: BC Managed Care – PPO | Attending: Emergency Medicine | Admitting: Emergency Medicine

## 2020-11-17 DIAGNOSIS — R42 Dizziness and giddiness: Secondary | ICD-10-CM

## 2020-11-17 DIAGNOSIS — R519 Headache, unspecified: Secondary | ICD-10-CM

## 2020-11-17 DIAGNOSIS — Z20822 Contact with and (suspected) exposure to covid-19: Secondary | ICD-10-CM | POA: Diagnosis not present

## 2020-11-17 MED ORDER — DIPHENHYDRAMINE HCL 50 MG/ML IJ SOLN
25.0000 mg | Freq: Once | INTRAMUSCULAR | Status: AC
  Administered 2020-11-17: 25 mg via INTRAVENOUS

## 2020-11-17 MED ORDER — KETOROLAC TROMETHAMINE 30 MG/ML IJ SOLN
30.0000 mg | Freq: Once | INTRAMUSCULAR | Status: AC
  Administered 2020-11-17: 30 mg via INTRAMUSCULAR

## 2020-11-17 MED ORDER — ONDANSETRON 8 MG PO TBDP: ORAL_TABLET | ORAL | 0 refills | Status: DC

## 2020-11-17 MED ORDER — SODIUM CHLORIDE 0.9 % IV SOLN: Freq: Once | INTRAVENOUS | Status: AC

## 2020-11-17 MED ORDER — DEXAMETHASONE SODIUM PHOSPHATE 10 MG/ML IJ SOLN
10.0000 mg | Freq: Once | INTRAMUSCULAR | Status: AC
  Administered 2020-11-17: 10 mg via INTRAMUSCULAR

## 2020-11-17 MED ORDER — CYCLOBENZAPRINE HCL 10 MG PO TABS: 10.0000 mg | ORAL_TABLET | Freq: Every day | ORAL | 0 refills | Status: DC

## 2020-11-17 MED ORDER — IBUPROFEN 600 MG PO TABS: 600.0000 mg | ORAL_TABLET | Freq: Four times a day (QID) | ORAL | 0 refills | Status: DC | PRN

## 2020-11-17 MED ORDER — METOCLOPRAMIDE HCL 5 MG/ML IJ SOLN
10.0000 mg | Freq: Once | INTRAMUSCULAR | Status: AC
  Administered 2020-11-17: 10 mg via INTRAVENOUS

## 2020-11-17 NOTE — Discharge Instructions (Addendum)
600 mg of ibuprofen combined with 1000 mg Tylenol 3-4 times a day.  Soft diet, push electrolyte containing fluids such as Pedialyte.  Flexeril at night to help prevent you from grinding your teeth.  Zofran as needed for nausea and vomiting.

## 2020-11-17 NOTE — ED Provider Notes (Signed)
HPI  SUBJECTIVE:  Shannon [name] is a 40 y.o. female who reports 3 to 4 days of constant, daily, throbbing diffuse headache.  She reports nausea, fatigue, dizziness/lightheadedness when she stands up, photophobia, phonophobia.  Reports intermittently blurry vision that lasts a second or 2, sinus pain and pressure, bilateral ear pain.  States that she is eating and drinking normally.  States that the onset of the headache was acute, however it was not at its worst when it started. No fevers, body aches, nasal congestion, double vision, visual loss, dental pain, neck stiffness, rash, arm or leg weakness, slurred speech, facial droop, vertigo.  No syncope, seizures.  Significant other states that she does not grind her teeth at night.  No body aches,  rhinorrhea, sore throat, loss of sense of smell or taste, cough, shortness of breath, vomiting, diarrhea, abdominal pain. she states that she has had similar headaches in the past, but this is different in that it is not resolving with usual measures.  No antipyretic in the past 6 hours.  She states she has been alternating 800 mg of ibuprofen with 1000 mg of Tylenol every 8 hours, has also tried Excedrin, BC powders, Flexeril.  No alleviating factors.  Symptoms are worse with noise and light.  She got the second dose of the Moderna vaccine on 09/16/2020.  She has a past medical history of headaches, asthma.  No history of hypertension, diabetes, stroke, atrial fibrillation, seizures, aneurysm, TMJ arthralgia.  LMP: Last month.  Denies possibility of being pregnant.  BMW:UXLKGMW, Bailey Mech, MD   Past Medical History:  Diagnosis Date  . ADD (attention deficit disorder)   . Anemia   . Anxiety   . Asthma   . Back pain   . Bilateral swelling of feet   . Chest pain   . Constipation   . Depression   . Hyperlipidemia   . Hypertension    Preeclampsia post delivery  . Irregular heart beat   . Joint pain   . Multiple food allergies   . Obesity   . Pre-diabetes    . Shortness of breath   . Vitamin D deficiency     Past Surgical History:  Procedure Laterality Date  . DILATATION & CURRETTAGE/HYSTEROSCOPY WITH RESECTOCOPE N/A 02/03/2014   Procedure: DILATATION & CURETTAGE/HYSTEROSCOPY ;  Surgeon: Delice Lesch, MD;  Location: [facility];  Service: Gynecology;  Laterality: N/A;  . MYOMECTOMY N/A 02/03/2014   Procedure: MYOMECTOMY ;  Surgeon: Delice Lesch, MD;  Location: [facility];  Service: Gynecology;  Laterality: N/A;  . SALPINGECTOMY    . tonsils and adnoids removed    . TUBAL LIGATION N/A 08/19/2015   Procedure: POST PARTUM TUBAL LIGATION;  Surgeon: Everett Graff, MD;  Location: [facility];  Service: Gynecology;  Laterality: N/A;  . WISDOM TOOTH EXTRACTION      Family History  Problem Relation Age of Onset  . Hypertension Mother   . Diabetes Mother   . Mental illness Mother   . Hyperlipidemia Mother   . Stroke Mother   . Depression Mother   . Anxiety disorder Mother   . Bipolar disorder Mother   . Liver disease Mother   . Sleep apnea Mother   . Obesity Mother   . Hypertension Father   . Prostate cancer Father   . Asthma Brother   . Heart disease Maternal Grandmother   . Hypertension Maternal Grandmother   . Cancer Paternal Grandmother        breast cancer  .  Seizures Brother     Social History   Tobacco Use  . Smoking status: Never Smoker  . Smokeless tobacco: Never Used  Vaping Use  . Vaping Use: Never used  Substance Use Topics  . Alcohol use: No    Comment: occasionally  . Drug use: No    No current facility-administered medications for this encounter.  Current Outpatient Medications:  .  cyclobenzaprine (FLEXERIL) 10 MG tablet, Take 1 tablet (10 mg total) by mouth at bedtime., Disp: 20 tablet, Rfl: 0 .  ibuprofen (ADVIL) 600 MG tablet, Take 1 tablet (600 mg total) by mouth every 6 (six) hours as needed., Disp: 30 tablet, Rfl: 0 .  ondansetron (ZOFRAN ODT) 8 MG disintegrating tablet, 1/2- 1 tablet q 8 hr prn nausea,  vomiting, Disp: 20 tablet, Rfl: 0 .  albuterol (PROVENTIL HFA;VENTOLIN HFA) 108 (90 Base) MCG/ACT inhaler, Inhale 2 puffs into the lungs every 6 (six) hours as needed for wheezing or shortness of breath., Disp: 1 Inhaler, Rfl: 3 .  DULoxetine (CYMBALTA) 60 MG capsule, Take 1 capsule by mouth daily. , Disp: , Rfl: 1  Allergies  Allergen Reactions  . Diclofenac Rash  . Pineapple Shortness Of Breath  . Ceftriaxone Sodium Hives  . Diflucan [Fluconazole] Hives    Raw rash on abdomen and she had sores and swelling in mouth   . Eggs Or Egg-Derived Products Nausea And Vomiting    Can eat cake made with eggs  . Bactrim [Sulfamethoxazole-Trimethoprim] Hives  . Latex Rash     ROS  As noted in HPI.   Physical Exam  BP 115/81 (BP Location: Left Arm)   Pulse (!) 101   Temp 98.5 F (36.9 C) (Oral)   Resp 16   Ht 5\' 4"  (1.626 m)   Wt 95.3 kg   LMP 10/17/2020   SpO2 96%   BMI 36.05 kg/m   Constitutional: Well developed, well nourished, moderate painful distress.  Photophobic. Eyes: PERRL, EOMI, conjunctiva normal bilaterally.  HENT: Normocephalic, atraumatic,mucus membranes moist, poor, but nontender dentition.  TM normal b/l.  Positive exquisite bilateral TMJ tenderness. No nasal congestion, positive sinus tenderness. No temporal artery tenderness.  Neck: no cervical LN - trapezial muscle tenderness. No meningismus Respiratory: normal inspiratory effort Cardiovascular: Regular tachycardia, no murmurs rubs or gallops GI:  nondistended skin: No rash, skin intact Musculoskeletal: No edema, no tenderness, no deformities Neurologic: Alert & oriented x 3, CN III-XII intact,  finger-> nose, heel-> shin equal b/l,  tandem gait steady.  Gets lightheaded when stands up. Psychiatric: Speech and behavior appropriate   ED Course   Medications  0.9 %  sodium chloride infusion ( Intravenous Stopped 11/17/20 1655)  dexamethasone (DECADRON) injection 10 mg (10 mg Intramuscular Given 11/17/20 1617)   ketorolac (TORADOL) 30 MG/ML injection 30 mg (30 mg Intramuscular Given 11/17/20 1605)  metoCLOPramide (REGLAN) injection 10 mg (10 mg Intravenous Given 11/17/20 1608)  diphenhydrAMINE (BENADRYL) injection 25 mg (25 mg Intravenous Given 11/17/20 1610)    Orders Placed This Encounter  Procedures  . Covid-19, Flu A+B ([facility])    Standing Status:   Standing    Number of Occurrences:   1  . Insert peripheral IV    Standing Status:   Standing    Number of Occurrences:   1   No results found for this or any previous visit (from the past 24 hour(s)). No results found.   ED Clinical Impression  1. Acute nonintractable headache, unspecified headache type   2. Exposure to  COVID-19 virus   3. Dizziness     ED Assessment/Plan  COVID, flu sent.  Suspect COVID triggering her headache.  However, she does have diffuse sinus tenderness although no nasal congestion, she has exquisite bilateral TMJ tenderness which could be contributing to her headache as well.  Patient appears to be in moderate painful distress.  She gets very lightheaded when standing up.  offered transfer to the emergency department for IV fluids, pain control and labs, but patient would like to try headache cocktail and IV fluids here first.  Feel that she is stable enough to try this.  She will go to the ED if she is not better after fluids and medications.  While patient states that this was acute in onset, was not worst at its onset, and it has not changed since it started.  She states this is identical to previous headaches, but this is different in the fact that it is not resolving despite which she denies.  Doubt SAH, ICH or space occupying lesion. Pt without fevers/chills, Pt has no meningeal sx, no nuchal rigidity. Doubt meningitis. Pt with normal neuro exam, no evidence of CVA/TIA.  Pt BP not elevated significantly, doubt hypertensive emergency. No evidence of temporal artery tenderness, no evidence of glaucoma or other  ocular pathology. Will give headache cocktail (dexamethasone 10 IV   Reglan 10 IV, toradol 30 IV, benadryl 25 IV), IVF and reassess.  Pt much improved after medications.  Still dizzy when she stands up.  Heart rate now in the 90s. Pt with continued non-focal neuro exam.  Still think this is COVID, wonder if she does not have a component of TMJ arthralgia with this given the TMJ tenderness.  Will send home with Tylenol/ibuprofen, soft diet, Flexeril for her to take at night, push fluids, Zofran.  She is to go to the ER if not better by tomorrow or if gets worse she will go immediately there.  Otherwise follow-up with her PMD as needed.   Discussed  MDM, plan for follow up, signs and sx that should prompt return to ER. Pt agrees with plan  Meds ordered this encounter  Medications  . 0.9 %  sodium chloride infusion  . dexamethasone (DECADRON) injection 10 mg  . ketorolac (TORADOL) 30 MG/ML injection 30 mg  . metoCLOPramide (REGLAN) injection 10 mg  . diphenhydrAMINE (BENADRYL) injection 25 mg  . ibuprofen (ADVIL) 600 MG tablet    Sig: Take 1 tablet (600 mg total) by mouth every 6 (six) hours as needed.    Dispense:  30 tablet    Refill:  0  . cyclobenzaprine (FLEXERIL) 10 MG tablet    Sig: Take 1 tablet (10 mg total) by mouth at bedtime.    Dispense:  20 tablet    Refill:  0  . ondansetron (ZOFRAN ODT) 8 MG disintegrating tablet    Sig: 1/2- 1 tablet q 8 hr prn nausea, vomiting    Dispense:  20 tablet    Refill:  0    *This clinic note was created using [employer]. Therefore, there may be occasional mistakes despite careful proofreading.  ?    Melynda Ripple, MD 11/18/20 [phone]

## 2020-11-17 NOTE — ED Triage Notes (Signed)
Headache x 3 days, feels tired and sleepy blurred vision, feels dizzy

## 2020-11-18 LAB — COVID-19, FLU A+B NAA
Influenza A, NAA: NOT DETECTED
Influenza B, NAA: NOT DETECTED
SARS-CoV-2, NAA: NOT DETECTED

## 2020-12-26 ENCOUNTER — Encounter: Payer: Self-pay | Admitting: Internal Medicine

## 2020-12-27 ENCOUNTER — Ambulatory Visit (INDEPENDENT_AMBULATORY_CARE_PROVIDER_SITE_OTHER): Payer: BC Managed Care – PPO | Admitting: Nurse Practitioner

## 2020-12-27 ENCOUNTER — Encounter: Payer: Self-pay | Admitting: Nurse Practitioner

## 2020-12-27 ENCOUNTER — Other Ambulatory Visit: Payer: Self-pay

## 2020-12-27 VITALS — BP 128/74 | HR 108 | Temp 98.1°F | Ht 65.2 in | Wt 250.4 lb

## 2020-12-27 DIAGNOSIS — M7989 Other specified soft tissue disorders: Secondary | ICD-10-CM | POA: Diagnosis not present

## 2020-12-27 DIAGNOSIS — M79672 Pain in left foot: Secondary | ICD-10-CM

## 2020-12-27 DIAGNOSIS — J0110 Acute frontal sinusitis, unspecified: Secondary | ICD-10-CM | POA: Diagnosis not present

## 2020-12-27 DIAGNOSIS — Z6841 Body Mass Index (BMI) 40.0 and over, adult: Secondary | ICD-10-CM

## 2020-12-27 DIAGNOSIS — E66813 Obesity, class 3: Secondary | ICD-10-CM

## 2020-12-27 DIAGNOSIS — F3289 Other specified depressive episodes: Secondary | ICD-10-CM | POA: Diagnosis not present

## 2020-12-27 MED ORDER — AZITHROMYCIN 250 MG PO TABS: ORAL_TABLET | ORAL | 0 refills | Status: AC

## 2020-12-27 NOTE — Progress Notes (Signed)
I,Tianna Badgett,acting as a Education administrator for [employer], FNP.,have documented all relevant documentation on the behalf of Minette Brine, FNP,as directed by  Minette Brine, FNP while in the presence of Minette Brine, [place].  This visit occurred during the SARS-CoV-2 public health emergency.  Safety protocols were in place, including screening questions prior to the visit, additional usage of staff PPE, and extensive cleaning of exam room while observing appropriate contact time as indicated for disinfecting solutions.  Subjective:     Patient ID: Shannon [name] , female    DOB: 04-07-1981 , 40 y.o.   MRN: [id number]   Chief Complaint  Patient presents with  . Foot Swelling    HPI  Patient is here for foot pain. She also have complaints of congestion and headaches. She was seen at Urgent Care given a "cocktail".  Continues to have nasal congestion and has not had any antibiotics.  She is taking Zyrtec daily without relief. She has also taken benadryl which keeps her sleepy for a while.  She has had a fever up to 3 days ago. She did a home covid test which was negative.   Left ankle swelling over the last few weeks. On Sunday had swelled to the point unable to put pressure on her foot. She drank apple cider vinegar and cherry juice and elevation.  Went down a little but is now swelling again.   She also has low back pain pain when she coughs.  She has been coughing more as well for the last 3 weeks.   She is also having an increase in depression.  She is being seen at Dr. Marquis Buggy office. They are planning to let her go the next cycle wit the change in the weather.   She has had an increase in weight over the last 6 months she thinks about 20 lbs.      Past Medical History:  Diagnosis Date  . ADD (attention deficit disorder)   . Anemia   . Anxiety   . Asthma   . Back pain   . Bilateral swelling of feet   . Chest pain   . Constipation   . Depression   . Hyperlipidemia   . Hypertension     Preeclampsia post delivery  . Irregular heart beat   . Joint pain   . Multiple food allergies   . Obesity   . Pre-diabetes   . Shortness of breath   . Vitamin D deficiency      Family History  Problem Relation Age of Onset  . Hypertension Mother   . Diabetes Mother   . Mental illness Mother   . Hyperlipidemia Mother   . Stroke Mother   . Depression Mother   . Anxiety disorder Mother   . Bipolar disorder Mother   . Liver disease Mother   . Sleep apnea Mother   . Obesity Mother   . Hypertension Father   . Prostate cancer Father   . Asthma Brother   . Heart disease Maternal Grandmother   . Hypertension Maternal Grandmother   . Cancer Paternal Grandmother        breast cancer  . Seizures Brother      Current Outpatient Medications:  .  azithromycin (ZITHROMAX) 250 MG tablet, Take 2 tablets (500 mg) on  Day 1,  followed by 1 tablet (250 mg) once daily on Days 2 through 5., Disp: 6 each, Rfl: 0 .  albuterol (PROVENTIL HFA;VENTOLIN HFA) 108 (90 Base) MCG/ACT inhaler, Inhale 2 puffs  into the lungs every 6 (six) hours as needed for wheezing or shortness of breath., Disp: 1 Inhaler, Rfl: 3 .  cyclobenzaprine (FLEXERIL) 10 MG tablet, Take 1 tablet (10 mg total) by mouth at bedtime., Disp: 20 tablet, Rfl: 0 .  DULoxetine (CYMBALTA) 60 MG capsule, Take 1 capsule by mouth daily. , Disp: , Rfl: 1 .  ibuprofen (ADVIL) 600 MG tablet, Take 1 tablet (600 mg total) by mouth every 6 (six) hours as needed., Disp: 30 tablet, Rfl: 0 .  ondansetron (ZOFRAN ODT) 8 MG disintegrating tablet, 1/2- 1 tablet q 8 hr prn nausea, vomiting, Disp: 20 tablet, Rfl: 0 .  VYVANSE 70 MG capsule, Take 70 mg by mouth every morning., Disp: , Rfl:    Allergies  Allergen Reactions  . Diclofenac Rash  . Pineapple Shortness Of Breath  . Ceftriaxone Sodium Hives  . Diflucan [Fluconazole] Hives    Raw rash on abdomen and she had sores and swelling in mouth   . Eggs Or Egg-Derived Products Nausea And Vomiting    Can  eat cake made with eggs  . Bactrim [Sulfamethoxazole-Trimethoprim] Hives  . Latex Rash     Review of Systems  HENT: Positive for congestion.   Neurological: Positive for headaches.     Today's Vitals   12/27/20 1038  BP: 128/74  Pulse: (!) 108  Temp: 98.1 F (36.7 C)  TempSrc: Oral  Weight: 250 lb 6.4 oz (113.6 kg)  Height: 5' 5.2" (1.656 m)   Body mass index is 41.41 kg/m.  Wt Readings from Last 3 Encounters:  12/27/20 250 lb 6.4 oz (113.6 kg)  11/17/20 210 lb (95.3 kg)  08/08/20 242 lb 12.8 oz (110.1 kg)    Objective:  Physical Exam Constitutional:      General: She is not in acute distress.    Appearance: Normal appearance. She is obese.  HENT:     Nose:     Right Sinus: Frontal sinus tenderness present. No maxillary sinus tenderness.     Left Sinus: Frontal sinus tenderness present. No maxillary sinus tenderness.  Cardiovascular:     Rate and Rhythm: Normal rate and regular rhythm.     Pulses: Normal pulses.     Heart sounds: Normal heart sounds. No murmur heard.   Pulmonary:     Effort: Pulmonary effort is normal. No respiratory distress.     Breath sounds: Normal breath sounds. No wheezing.  Musculoskeletal:        General: No swelling or tenderness.     Right lower leg: No edema.     Left lower leg: No edema.  Neurological:     General: No focal deficit present.     Mental Status: She is alert and oriented to person, place, and time.     Cranial Nerves: No cranial nerve deficit.  Psychiatric:        Mood and Affect: Mood normal.        Behavior: Behavior normal.        Thought Content: Thought content normal.        Judgment: Judgment normal.         Assessment And Plan:     1. Swelling of left foot  I currently do not see any swelling but I have encouraged her to make sure she is drinking adequate amounts of water. If worsens return to office.  2. Acute non-recurrent frontal sinusitis  Will treat with azithromycin  She is hoping after  she takes this her fatigue may  improve - azithromycin (ZITHROMAX) 250 MG tablet; Take 2 tablets (500 mg) on  Day 1,  followed by 1 tablet (250 mg) once daily on Days 2 through 5.  Dispense: 6 each; Refill: 0  3. Other depression  She is being followed by a therapist and will determine if her mood and fatigue is related to a sinus infection before considering any changes to her medications by her therapist/psychiatrist  4. Class 3 severe obesity due to excess calories without serious comorbidity with body mass index (BMI) of 40.0 to 44.9 in adult [facility])  Chronic  Lipo b injection this will also help her fatigue  Patient was given opportunity to ask questions. Patient verbalized understanding of the plan and was able to repeat key elements of the plan. All questions were answered to their satisfaction.  Minette Brine, FNP   I, Minette Brine, have reviewed all documentation for this visit. The documentation on 12/27/20 for the exam, diagnosis, procedures, and orders are all accurate and complete.   THE PATIENT IS ENCOURAGED TO PRACTICE SOCIAL DISTANCING DUE TO THE COVID-19 PANDEMIC.

## 2021-02-06 ENCOUNTER — Encounter: Payer: Self-pay | Admitting: Internal Medicine

## 2021-02-06 ENCOUNTER — Encounter: Payer: Self-pay | Admitting: Nurse Practitioner

## 2021-02-07 ENCOUNTER — Ambulatory Visit: Payer: BC Managed Care – PPO | Admitting: Nurse Practitioner

## 2021-02-18 ENCOUNTER — Emergency Department (HOSPITAL_COMMUNITY)
Admission: EM | Admit: 2021-02-18 | Discharge: 2021-02-18 | Disposition: A | Discharge status: Home / Self Care | Payer: BC Managed Care – PPO | Attending: Emergency Medicine | Admitting: Emergency Medicine

## 2021-02-18 ENCOUNTER — Other Ambulatory Visit: Payer: Self-pay

## 2021-02-18 ENCOUNTER — Encounter (HOSPITAL_COMMUNITY): Payer: Self-pay

## 2021-02-18 DIAGNOSIS — I10 Essential (primary) hypertension: Secondary | ICD-10-CM | POA: Diagnosis not present

## 2021-02-18 DIAGNOSIS — M5416 Radiculopathy, lumbar region: Secondary | ICD-10-CM

## 2021-02-18 DIAGNOSIS — J45909 Unspecified asthma, uncomplicated: Secondary | ICD-10-CM | POA: Insufficient documentation

## 2021-02-18 DIAGNOSIS — M545 Low back pain, unspecified: Secondary | ICD-10-CM | POA: Diagnosis present

## 2021-02-18 DIAGNOSIS — Z91040 Latex allergy status: Secondary | ICD-10-CM | POA: Insufficient documentation

## 2021-02-18 MED ORDER — HYDROMORPHONE HCL 1 MG/ML IJ SOLN
1.0000 mg | Freq: Once | INTRAMUSCULAR | Status: AC
Start: 2021-02-18 — End: 2021-02-18
  Administered 2021-02-18: 1 mg via INTRAVENOUS
  Filled 2021-02-18: qty 1

## 2021-02-18 MED ORDER — HYDROMORPHONE HCL 1 MG/ML IJ SOLN
1.0000 mg | Freq: Once | INTRAMUSCULAR | Status: AC
  Administered 2021-02-18: 1 mg via INTRAVENOUS
  Filled 2021-02-18: qty 1

## 2021-02-18 MED ORDER — PREDNISONE 20 MG PO TABS: ORAL_TABLET | ORAL | 0 refills | Status: DC

## 2021-02-18 MED ORDER — OXYCODONE-ACETAMINOPHEN 5-325 MG PO TABS: 1.0000 | ORAL_TABLET | ORAL | 0 refills | Status: DC | PRN

## 2021-02-18 MED ORDER — ONDANSETRON HCL 4 MG/2ML IJ SOLN
4.0000 mg | Freq: Once | INTRAMUSCULAR | Status: AC
  Administered 2021-02-18: 4 mg via INTRAVENOUS
  Filled 2021-02-18: qty 2

## 2021-02-18 MED ORDER — ONDANSETRON 4 MG PO TBDP
4.0000 mg | ORAL_TABLET | Freq: Once | ORAL | Status: AC
  Administered 2021-02-18: 4 mg via ORAL
  Filled 2021-02-18: qty 1

## 2021-02-18 NOTE — ED Notes (Signed)
Patient only reports slight improvement in pain. EDP made aware, verbal orders given and verified.

## 2021-02-18 NOTE — ED Provider Notes (Signed)
[facility] EMERGENCY DEPARTMENT Provider Note   CSN: [id number] Arrival date & time: 02/18/21  8469     History Chief Complaint  Patient presents with  . Back Pain    Shannon [name] is a 40 y.o. female.  Patient presents to the emergency department for evaluation of low back pain radiating to the legs.  Patient reports that this has been ongoing for some time.  It started as pain across lower back radiating down to single leg but now she has bilateral radicular pain that has become severe.  She has tried NSAIDs and steroids without improvement.  Patient reports that she has had multiple imaging modalities, was told by urgent care that she required an MRI.  They tried to order one as an outpatient but insurance declined it.        Past Medical History:  Diagnosis Date  . ADD (attention deficit disorder)   . Anemia   . Anxiety   . Asthma   . Back pain   . Bilateral swelling of feet   . Chest pain   . Constipation   . Depression   . Hyperlipidemia   . Hypertension    Preeclampsia post delivery  . Irregular heart beat   . Joint pain   . Multiple food allergies   . Obesity   . Pre-diabetes   . Shortness of breath   . Vitamin D deficiency     Patient Active Problem List   Diagnosis Date Noted  . Elevated blood-pressure reading without diagnosis of hypertension 05/26/2019  . Class 2 obesity due to excess calories without serious comorbidity with body mass index (BMI) of 39.0 to 39.9 in adult 05/26/2019  . Headache disorder 08/28/2015  . Pre-eclampsia in postpartum period 08/26/2015  . Susceptible to varicella (non-immune), currently pregnant 08/18/2015  . History of myomectomy 08/18/2015  . Latex allergy 08/18/2015  . Hypertensive disorder-resolved with weight loss 08/18/2015  . SVD (spontaneous vaginal delivery) 08/18/2015  . Depression 01/19/2013  . Vitamin D deficiency 11/21/2012  . OVERWEIGHT 03/25/2009  . BACK PAIN WITH RADICULOPATHY 03/23/2009  . ASTHMA,  UNSPECIFIED, UNSPECIFIED STATUS 10/03/2008    Past Surgical History:  Procedure Laterality Date  . DILATATION & CURRETTAGE/HYSTEROSCOPY WITH RESECTOCOPE N/A 02/03/2014   Procedure: DILATATION & CURETTAGE/HYSTEROSCOPY ;  Surgeon: Delice Lesch, MD;  Location: [facility];  Service: Gynecology;  Laterality: N/A;  . MYOMECTOMY N/A 02/03/2014   Procedure: MYOMECTOMY ;  Surgeon: Delice Lesch, MD;  Location: [facility];  Service: Gynecology;  Laterality: N/A;  . SALPINGECTOMY    . tonsils and adnoids removed    . TUBAL LIGATION N/A 08/19/2015   Procedure: POST PARTUM TUBAL LIGATION;  Surgeon: Everett Graff, MD;  Location: [facility];  Service: Gynecology;  Laterality: N/A;  . WISDOM TOOTH EXTRACTION       OB History    Gravida  3   Para  2   Term  2   Preterm      AB  1   Living  2     SAB  1   IAB      Ectopic      Multiple  0   Live Births  1           Family History  Problem Relation Age of Onset  . Hypertension Mother   . Diabetes Mother   . Mental illness Mother   . Hyperlipidemia Mother   . Stroke Mother   . Depression Mother   .  Anxiety disorder Mother   . Bipolar disorder Mother   . Liver disease Mother   . Sleep apnea Mother   . Obesity Mother   . Hypertension Father   . Prostate cancer Father   . Asthma Brother   . Heart disease Maternal Grandmother   . Hypertension Maternal Grandmother   . Cancer Paternal Grandmother        breast cancer  . Seizures Brother     Social History   Tobacco Use  . Smoking status: Never Smoker  . Smokeless tobacco: Never Used  Vaping Use  . Vaping Use: Never used  Substance Use Topics  . Alcohol use: No    Comment: occasionally  . Drug use: No    Home Medications Prior to Admission medications   Medication Sig Start Date End Date Taking? Authorizing Provider  oxyCODONE-acetaminophen (PERCOCET) 5-325 MG tablet Take 1-2 tablets by mouth every 4 (four) hours as needed. 02/18/21  Yes Beanca Kiester, Gwenyth Allegra, MD   predniSONE (DELTASONE) 20 MG tablet 3 tabs po daily x 3 days, then 2 tabs x 3 days, then 1.5 tabs x 3 days, then 1 tab x 3 days, then 0.5 tabs x 3 days 02/18/21  Yes Domingue Coltrain, Gwenyth Allegra, MD  albuterol (PROVENTIL HFA;VENTOLIN HFA) 108 (90 Base) MCG/ACT inhaler Inhale 2 puffs into the lungs every 6 (six) hours as needed for wheezing or shortness of breath. 11/25/18   Glendale Chard, MD  cyclobenzaprine (FLEXERIL) 10 MG tablet Take 1 tablet (10 mg total) by mouth at bedtime. 11/17/20   Melynda Ripple, MD  DULoxetine (CYMBALTA) 60 MG capsule Take 1 capsule by mouth daily.  06/23/18   [provider]  ibuprofen (ADVIL) 600 MG tablet Take 1 tablet (600 mg total) by mouth every 6 (six) hours as needed. 11/17/20   Melynda Ripple, MD  ondansetron (ZOFRAN ODT) 8 MG disintegrating tablet 1/2- 1 tablet q 8 hr prn nausea, vomiting 11/17/20   Melynda Ripple, MD  VYVANSE 70 MG capsule Take 70 mg by mouth every morning. 12/19/20   [provider]    Allergies    Diclofenac, Pineapple, Ceftriaxone sodium, Diflucan [fluconazole], Eggs or egg-derived products, Bactrim [sulfamethoxazole-trimethoprim], and Latex  Review of Systems   Review of Systems  Musculoskeletal: Positive for back pain.  All other systems reviewed and are negative.   Physical Exam Updated Vital Signs BP (!) 145/100 (BP Location: Right Arm)   Pulse 99   Temp 98.1 F (36.7 C) (Oral)   Resp 20   Ht 5\' 4"  (1.626 m)   Wt 109.3 kg   SpO2 99%   BMI 41.37 kg/m   Physical Exam Vitals and nursing note reviewed.  Constitutional:      General: She is not in acute distress.    Appearance: Normal appearance. She is well-developed.  HENT:     Head: Normocephalic and atraumatic.     Right Ear: Hearing normal.     Left Ear: Hearing normal.     Nose: Nose normal.  Eyes:     Conjunctiva/sclera: Conjunctivae normal.     Pupils: Pupils are equal, round, and reactive to light.  Cardiovascular:     Rate and Rhythm:  Regular rhythm.     Heart sounds: S1 normal and S2 normal. No murmur heard. No friction rub. No gallop.   Pulmonary:     Effort: Pulmonary effort is normal. No respiratory distress.     Breath sounds: Normal breath sounds.  Chest:     Chest wall:  No tenderness.  Abdominal:     General: Bowel sounds are normal.     Palpations: Abdomen is soft.     Tenderness: There is no abdominal tenderness. There is no guarding or rebound. Negative signs include Murphy's sign and McBurney's sign.     Hernia: No hernia is present.  Musculoskeletal:     Cervical back: Normal range of motion and neck supple.     Lumbar back: Spasms and tenderness present. Positive right straight leg raise test and positive left straight leg raise test.  Skin:    General: Skin is warm and dry.     Findings: No rash.  Neurological:     Mental Status: She is alert and oriented to person, place, and time.     GCS: GCS eye subscore is 4. GCS verbal subscore is 5. GCS motor subscore is 6.     Cranial Nerves: No cranial nerve deficit.     Sensory: No sensory deficit.     Coordination: Coordination normal.  Psychiatric:        Speech: Speech normal.        Behavior: Behavior normal.        Thought Content: Thought content normal.     ED Results / Procedures / Treatments   Labs (all labs ordered are listed, but only abnormal results are displayed) Labs Reviewed - No data to display  EKG None  Radiology No results found.  Procedures Procedures   Medications Ordered in ED Medications  HYDROmorphone (DILAUDID) injection 1 mg (has no administration in time range)  ondansetron (ZOFRAN) injection 4 mg (has no administration in time range)    ED Course  I have reviewed the triage vital signs and the nursing notes.  Pertinent labs & imaging results that were available during my care of the patient were reviewed by me and considered in my medical decision making (see chart for details).    MDM  Rules/Calculators/A&P                          Patient presents to the ER with musculoskeletal back pain. Examination reveals back tenderness without any associated neurologic findings. Patient's strength, sensation and reflexes were normal. There is no evidence of saddle anesthesia. Patient does not have a foot drop. Patient has not experienced any change in bowel or bladder function. As such, patient did not require any imaging or further studies. Patient was treated with analgesia.  Patient with moderate to severe low back pain with radiculopathy that has been ongoing for some time.  Patient presents today with expectations to receive an MRI.  It is Saturday morning and MRI not available here until Monday morning.  I do not see any red flags that would require transfer for emergent MRI.  This was shared with the patient.  She agrees that it is reasonable for me to try to achieve better analgesia for her for the weekend and will follow-up during the week.     Final Clinical Impression(s) / ED Diagnoses Final diagnoses:  Lumbar radiculopathy    Rx / DC Orders ED Discharge Orders         Ordered    predniSONE (DELTASONE) 20 MG tablet        02/18/21 0705    oxyCODONE-acetaminophen (PERCOCET) 5-325 MG tablet  Every 4 hours PRN        02/18/21 0705           Naftoli Penny, Gwenyth Allegra, MD  02/18/21 0705  

## 2021-02-18 NOTE — ED Provider Notes (Signed)
Patient CARE signed out to reassess after pain medications.  Patient presents with recurrent back pain lower with intermittent radicular symptoms.  No focal weakness.  Patient was told by another physician she likely will need MRI.  Unable to perform MRI in the emergency room and no emergent indication in terms of concern for cauda equina, focal weakness, fevers etc.  Discussed close follow-up with primary care doctor and orthopedics locally to arrange MRI early this week.  Repeat pain meds given.  Patient has a ride home with husband.  Previous clinician sent pain medications to pharmacy.  Golda Acre, MD 02/18/21 [phone]

## 2021-02-18 NOTE — ED Notes (Signed)
Patient had some nausea after standing and moving due to pain medication. EDP made aware, verbal orders given prior to patient signing discharge papers.

## 2021-02-18 NOTE — Discharge Instructions (Addendum)
Steroids and pain meds were sent to your pharmacy. Return for difficulties with bowel or bladder function, weakness, fevers or new concerns.  Discussed MRI this week with your primary doctor and/or orthopedics.

## 2021-02-18 NOTE — ED Triage Notes (Signed)
Pt c/o back and buttock pain since March. States the pain is "shooting, stabbing, and hurts to breathe" Pt states she had multiple xrays, scans with no answers and wants MRI done. Pt ambulatory with steady gait in triage.

## 2021-02-19 DIAGNOSIS — D447 Neoplasm of uncertain behavior of aortic body and other paraganglia: Secondary | ICD-10-CM

## 2021-02-19 HISTORY — DX: Neoplasm of uncertain behavior of aortic body and other paraganglia: D44.7

## 2021-02-20 ENCOUNTER — Telehealth: Payer: Self-pay

## 2021-02-20 NOTE — Telephone Encounter (Signed)
I called the pt because Dr. Baird Cancer wanted to check and see how the pt is doing because the pt recently had an er visit for back pain.

## 2021-02-26 ENCOUNTER — Encounter: Payer: Self-pay | Admitting: Internal Medicine

## 2021-02-27 ENCOUNTER — Emergency Department (HOSPITAL_COMMUNITY): Payer: BC Managed Care – PPO

## 2021-02-27 ENCOUNTER — Other Ambulatory Visit: Payer: Self-pay

## 2021-02-27 ENCOUNTER — Emergency Department (HOSPITAL_COMMUNITY)
Admission: EM | Admit: 2021-02-27 | Discharge: 2021-02-27 | Disposition: A | Discharge status: Home / Self Care | Payer: BC Managed Care – PPO | Attending: Emergency Medicine | Admitting: Emergency Medicine

## 2021-02-27 DIAGNOSIS — I10 Essential (primary) hypertension: Secondary | ICD-10-CM | POA: Diagnosis not present

## 2021-02-27 DIAGNOSIS — R7303 Prediabetes: Secondary | ICD-10-CM | POA: Diagnosis not present

## 2021-02-27 DIAGNOSIS — M5416 Radiculopathy, lumbar region: Secondary | ICD-10-CM

## 2021-02-27 DIAGNOSIS — Z91040 Latex allergy status: Secondary | ICD-10-CM | POA: Diagnosis not present

## 2021-02-27 DIAGNOSIS — M545 Low back pain, unspecified: Secondary | ICD-10-CM | POA: Insufficient documentation

## 2021-02-27 DIAGNOSIS — J45909 Unspecified asthma, uncomplicated: Secondary | ICD-10-CM | POA: Insufficient documentation

## 2021-02-27 MED ORDER — PREDNISONE 50 MG PO TABS
60.0000 mg | ORAL_TABLET | Freq: Once | ORAL | Status: AC
  Administered 2021-02-27: 60 mg via ORAL
  Filled 2021-02-27: qty 1

## 2021-02-27 MED ORDER — PREDNISONE 20 MG PO TABS: ORAL_TABLET | ORAL | 0 refills | Status: DC

## 2021-02-27 MED ORDER — HYDROMORPHONE HCL 2 MG/ML IJ SOLN
2.0000 mg | Freq: Once | INTRAMUSCULAR | Status: AC
  Administered 2021-02-27: 2 mg via INTRAMUSCULAR
  Filled 2021-02-27: qty 1

## 2021-02-27 MED ORDER — GADOBUTROL 1 MMOL/ML IV SOLN
10.0000 mL | Freq: Once | INTRAVENOUS | Status: AC | PRN
  Administered 2021-02-27: 10 mL via INTRAVENOUS

## 2021-02-27 MED ORDER — HYDROMORPHONE HCL 1 MG/ML IJ SOLN
1.0000 mg | Freq: Once | INTRAMUSCULAR | Status: AC
  Administered 2021-02-27: 1 mg via INTRAVENOUS
  Filled 2021-02-27: qty 1

## 2021-02-27 MED ORDER — OXYCODONE-ACETAMINOPHEN 5-325 MG PO TABS: 1.0000 | ORAL_TABLET | Freq: Four times a day (QID) | ORAL | 0 refills | Status: DC | PRN

## 2021-02-27 NOTE — ED Triage Notes (Signed)
pov from home with cc of lower back pain that is running down both legs. Was here recently for the same. Said that its not getting any better. Said she was recommended for an MRI but it will take too long to get it scheduled and requests to get one here.

## 2021-02-27 NOTE — ED Provider Notes (Signed)
[facility] EMERGENCY DEPARTMENT Provider Note   CSN: [id number] Arrival date & time: 02/27/21  4098     History Chief Complaint  Patient presents with  . Back Pain    Shannon [name] is a 40 y.o. female.  Patient is a 40 year old female with history of ADD, anemia, asthma, hypertension, hyperlipidemia, obesity.  Patient presents with complaints of low back pain.  She has been experiencing low back pain for several weeks now and has been seen by urgent care, once in the ER, by her primary doctor, however no medications or interventions have helped.  She had x-rays at urgent care which were unremarkable.  She was to have an MRI done, but this was declined by her insurance.  She presents today with ongoing pain radiating into both legs that is worse when she attempts to ambulate, lie flat, or move in any direction.  She denies any bowel or bladder complaints.  She denies any weakness in her legs.  The history is provided by the patient.       Past Medical History:  Diagnosis Date  . ADD (attention deficit disorder)   . Anemia   . Anxiety   . Asthma   . Back pain   . Bilateral swelling of feet   . Chest pain   . Constipation   . Depression   . Hyperlipidemia   . Hypertension    Preeclampsia post delivery  . Irregular heart beat   . Joint pain   . Multiple food allergies   . Obesity   . Pre-diabetes   . Shortness of breath   . Vitamin D deficiency     Patient Active Problem List   Diagnosis Date Noted  . Elevated blood-pressure reading without diagnosis of hypertension 05/26/2019  . Class 2 obesity due to excess calories without serious comorbidity with body mass index (BMI) of 39.0 to 39.9 in adult 05/26/2019  . Headache disorder 08/28/2015  . Pre-eclampsia in postpartum period 08/26/2015  . Susceptible to varicella (non-immune), currently pregnant 08/18/2015  . History of myomectomy 08/18/2015  . Latex allergy 08/18/2015  . Hypertensive disorder-resolved with weight  loss 08/18/2015  . SVD (spontaneous vaginal delivery) 08/18/2015  . Depression 01/19/2013  . Vitamin D deficiency 11/21/2012  . OVERWEIGHT 03/25/2009  . BACK PAIN WITH RADICULOPATHY 03/23/2009  . ASTHMA, UNSPECIFIED, UNSPECIFIED STATUS 10/03/2008    Past Surgical History:  Procedure Laterality Date  . DILATATION & CURRETTAGE/HYSTEROSCOPY WITH RESECTOCOPE N/A 02/03/2014   Procedure: DILATATION & CURETTAGE/HYSTEROSCOPY ;  Surgeon: Delice Lesch, MD;  Location: [facility];  Service: Gynecology;  Laterality: N/A;  . MYOMECTOMY N/A 02/03/2014   Procedure: MYOMECTOMY ;  Surgeon: Delice Lesch, MD;  Location: [facility];  Service: Gynecology;  Laterality: N/A;  . SALPINGECTOMY    . tonsils and adnoids removed    . TUBAL LIGATION N/A 08/19/2015   Procedure: POST PARTUM TUBAL LIGATION;  Surgeon: Everett Graff, MD;  Location: [facility];  Service: Gynecology;  Laterality: N/A;  . WISDOM TOOTH EXTRACTION       OB History    Gravida  3   Para  2   Term  2   Preterm      AB  1   Living  2     SAB  1   IAB      Ectopic      Multiple  0   Live Births  1           Family History  Problem Relation Age of Onset  . Hypertension Mother   . Diabetes Mother   . Mental illness Mother   . Hyperlipidemia Mother   . Stroke Mother   . Depression Mother   . Anxiety disorder Mother   . Bipolar disorder Mother   . Liver disease Mother   . Sleep apnea Mother   . Obesity Mother   . Hypertension Father   . Prostate cancer Father   . Asthma Brother   . Heart disease Maternal Grandmother   . Hypertension Maternal Grandmother   . Cancer Paternal Grandmother        breast cancer  . Seizures Brother     Social History   Tobacco Use  . Smoking status: Never Smoker  . Smokeless tobacco: Never Used  Vaping Use  . Vaping Use: Never used  Substance Use Topics  . Alcohol use: No    Comment: occasionally  . Drug use: No    Home Medications Prior to Admission medications    Medication Sig Start Date End Date Taking? Authorizing Provider  albuterol (PROVENTIL HFA;VENTOLIN HFA) 108 (90 Base) MCG/ACT inhaler Inhale 2 puffs into the lungs every 6 (six) hours as needed for wheezing or shortness of breath. 11/25/18   Glendale Chard, MD  cyclobenzaprine (FLEXERIL) 10 MG tablet Take 1 tablet (10 mg total) by mouth at bedtime. 11/17/20   Melynda Ripple, MD  DULoxetine (CYMBALTA) 60 MG capsule Take 1 capsule by mouth daily.  06/23/18   [provider]  ibuprofen (ADVIL) 600 MG tablet Take 1 tablet (600 mg total) by mouth every 6 (six) hours as needed. 11/17/20   Melynda Ripple, MD  ondansetron (ZOFRAN ODT) 8 MG disintegrating tablet 1/2- 1 tablet q 8 hr prn nausea, vomiting 11/17/20   Melynda Ripple, MD  oxyCODONE-acetaminophen (PERCOCET) 5-325 MG tablet Take 1-2 tablets by mouth every 4 (four) hours as needed. 02/18/21   Orpah Greek, MD  predniSONE (DELTASONE) 20 MG tablet 3 tabs po daily x 3 days, then 2 tabs x 3 days, then 1.5 tabs x 3 days, then 1 tab x 3 days, then 0.5 tabs x 3 days 02/18/21   Orpah Greek, MD  VYVANSE 70 MG capsule Take 70 mg by mouth every morning. 12/19/20   [provider]    Allergies    Diclofenac, Pineapple, Ceftriaxone sodium, Diflucan [fluconazole], Eggs or egg-derived products, Bactrim [sulfamethoxazole-trimethoprim], and Latex  Review of Systems   Review of Systems  All other systems reviewed and are negative.   Physical Exam Updated Vital Signs BP 124/88 (BP Location: Right Arm)   Pulse (!) 101   Temp 98.4 F (36.9 C)   Resp 18   Ht 5\' 4"  (1.626 m)   Wt 109.3 kg   SpO2 100%   BMI 41.37 kg/m   Physical Exam Vitals and nursing note reviewed.  Constitutional:      General: She is not in acute distress.    Appearance: She is well-developed. She is not diaphoretic.  HENT:     Head: Normocephalic and atraumatic.  Cardiovascular:     Rate and Rhythm: Normal rate and regular rhythm.      Heart sounds: No murmur heard. No friction rub. No gallop.   Pulmonary:     Effort: Pulmonary effort is normal. No respiratory distress.     Breath sounds: Normal breath sounds. No wheezing.  Abdominal:     General: Bowel sounds are normal. There is no distension.     Palpations:  Abdomen is soft.     Tenderness: There is no abdominal tenderness.  Musculoskeletal:        General: Normal range of motion.     Cervical back: Normal range of motion and neck supple.     Comments: There is tenderness to palpation in the soft tissues of the lumbar region.  Skin:    General: Skin is warm and dry.  Neurological:     Mental Status: She is alert and oriented to person, place, and time.     Comments: Strength is 5 out of 5 in both lower extremities.  DTRs are trace and symmetrical in both lower extremities.     ED Results / Procedures / Treatments   Labs (all labs ordered are listed, but only abnormal results are displayed) Labs Reviewed - No data to display  EKG None  Radiology No results found.  Procedures Procedures   Medications Ordered in ED Medications  HYDROmorphone (DILAUDID) injection 2 mg (has no administration in time range)    ED Course  I have reviewed the triage vital signs and the nursing notes.  Pertinent labs & imaging results that were available during my care of the patient were reviewed by me and considered in my medical decision making (see chart for details).    MDM Rules/Calculators/A&P  Patient with a several week history of worsening low back pain that began in the absence of any specific injury or trauma.  She describes pain that radiates into both legs that is debilitating and making it difficult for her to ambulate.  She has been seen by her primary doctor, by urgent care, and has had 1 prior ER visit for the same, however no medications have helped.  She will undergo MRI due to the progressive radicular nature of her pain as well as the level of pain  she is describing.  This study will be obtained after MRI arrives in the morning.  Care to be signed out to Dr. Ashok Cordia at shift change.  He will obtain the results of the MRI and determine the final disposition.  Final Clinical Impression(s) / ED Diagnoses Final diagnoses:  None    Rx / DC Orders ED Discharge Orders    None       Veryl Speak, MD 02/27/21 [phone]

## 2021-02-27 NOTE — Discharge Instructions (Addendum)
It was our pleasure to provide your ER care today - we hope that you feel better.  Take prednisone as prescribed.  You may take percocet as need for pain. No driving for the next 6 hours or when taking percocet. Also, do not take tylenol or acetaminophen containing medication when taking percocet.   Avoid bending at waist or heavy lifting > 10 lbs for the next week. Try heat therapy and gentle massage to sore area.   As we discussed, your MRI made note of 1. 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  We discussed your case/MRI with our neurosurgeons  - they indicate for you to follow up with them in the office in the next couple weeks - call office today to arrange appointment.   Also follow up with primary care doctor.  Return to ER if worse, new symptoms, fevers, intractable pain, numbness/weakness, problems with bowel/bladder function, or other concern.

## 2021-02-27 NOTE — ED Provider Notes (Signed)
Signed out by Dr Stark Jock to check MRI, d/c patient.   MRI reviewed, intradural mass noted. Neurosurgery consulted - they reviewed MRI, and indicate patient can f/u in office with them in the next few weeks, indicating no other emergent tests/treatment is needed in ED.   Patients pain is improved. No weakness. Afebrile.   Rec ns and pcp f/u.  Return precautions provided.      Lajean Saver, MD 02/27/21 1108

## 2021-02-27 NOTE — ED Notes (Signed)
Pt return from MRI. C/o 10/10 back pain.

## 2021-02-28 ENCOUNTER — Encounter: Payer: Self-pay | Admitting: Internal Medicine

## 2021-02-28 ENCOUNTER — Other Ambulatory Visit: Payer: Self-pay | Admitting: Internal Medicine

## 2021-02-28 ENCOUNTER — Telehealth: Payer: Self-pay

## 2021-02-28 MED ORDER — CYCLOBENZAPRINE HCL 10 MG PO TABS: 10.0000 mg | ORAL_TABLET | Freq: Every day | ORAL | 0 refills | Status: DC

## 2021-02-28 NOTE — Telephone Encounter (Signed)
I called and left the pt a message that Dr sanders wanted to check and see if the pt called and got an appt with the ortho in [place] yesterday.

## 2021-03-02 DIAGNOSIS — G9589 Other specified diseases of spinal cord: Secondary | ICD-10-CM | POA: Insufficient documentation

## 2021-03-08 ENCOUNTER — Other Ambulatory Visit: Payer: Self-pay | Admitting: Neurosurgery

## 2021-03-08 ENCOUNTER — Encounter: Payer: Self-pay | Admitting: Internal Medicine

## 2021-03-13 NOTE — Progress Notes (Signed)
Surgical Instructions    Your procedure is scheduled on Friday, May 27th, 2022.  Report to [facility] Main Entrance "A" at 07:30 A.M., then check in with the Admitting office.  Call this number if you have problems the morning of surgery:  [phone]   If you have any questions prior to your surgery date call [phone]: Open Monday-Friday 8am-4pm    Remember:  Do not eat after midnight the night before your surgery  You may drink clear liquids until the morning of your surgery.   Clear liquids allowed are: Water, Non-Citrus Juices (without pulp), Carbonated Beverages, Clear Tea, Black Coffee Only, and Gatorade    Take these medicines the morning of surgery with A SIP OF WATER   DULoxetine (CYMBALTA)  predniSONE (DELTASONE)  VYVANSE   If needed:  albuterol (PROVENTIL HFA;VENTOLIN HFA) - please, bring the inhaler with you cyclobenzaprine (FLEXERIL)  oxyCODONE-acetaminophen (PERCOCET/ROXICET)   As of today, STOP taking any Aspirin (unless otherwise instructed by your surgeon) Aleve, Naproxen, Ibuprofen, Motrin, Advil, Goody's, BC's, all herbal medications, fish oil, and all vitamins.                     Do not wear jewelry, make up, or nail polish            Do not wear lotions, powders, perfumes, or deodorant.            Do not shave 48 hours prior to surgery.              Do not bring valuables to the hospital.            [facility] is not responsible for any belongings or valuables.  Do NOT Smoke (Tobacco/Vaping) or drink Alcohol 24 hours prior to your procedure If you use a CPAP at night, you may bring all equipment for your overnight stay.   Contacts, glasses, dentures or bridgework may not be worn into surgery, please bring cases for these belongings   For patients admitted to the hospital, discharge time will be determined by your treatment team.   Patients discharged the day of surgery will not be allowed to drive home, and someone needs to stay with them for 24  hours.    Special instructions:    Oral Hygiene is also important to reduce your risk of infection.  Remember - BRUSH YOUR TEETH THE MORNING OF SURGERY WITH YOUR REGULAR TOOTHPASTE   [facility]- Preparing For Surgery  Before surgery, you can play an important role. Because skin is not sterile, your skin needs to be as free of germs as possible. You can reduce the number of germs on your skin by washing with CHG (chlorahexidine gluconate) Soap before surgery.  CHG is an antiseptic cleaner which kills germs and bonds with the skin to continue killing germs even after washing.     Please do not use if you have an allergy to CHG or antibacterial soaps. If your skin becomes reddened/irritated stop using the CHG.  Do not shave (including legs and underarms) for at least 48 hours prior to first CHG shower. It is OK to shave your face.  Please follow these instructions carefully.    1.  Shower the NIGHT BEFORE SURGERY and the MORNING OF SURGERY with CHG Soap.   If you chose to wash your hair, wash your hair first as usual with your normal shampoo. After you shampoo, rinse your hair and body thoroughly to remove the shampoo.  Then  Wash Face and genitals (private parts) with your normal soap and rinse thoroughly to remove soap.  2. After that Use CHG Soap as you would any other liquid soap. You can apply CHG directly to the skin and wash gently with a scrungie or a clean washcloth.   3. Apply the CHG Soap to your body ONLY FROM THE NECK DOWN.  Do not use on open wounds or open sores. Avoid contact with your eyes, ears, mouth and genitals (private parts). Wash Face and genitals (private parts)  with your normal soap.   4. Wash thoroughly, paying special attention to the area where your surgery will be performed.  5. Thoroughly rinse your body with warm water from the neck down.  6. DO NOT shower/wash with your normal soap after using and rinsing off the CHG Soap.  7. Pat yourself dry with a  CLEAN TOWEL.  8. Wear CLEAN PAJAMAS to bed the night before surgery  9. Place CLEAN SHEETS on your bed the night before your surgery  10. DO NOT SLEEP WITH PETS.   Day of Surgery:  Take a shower with CHG soap. Wear Clean/Comfortable clothing the morning of surgery Do not apply any deodorants/lotions.   Remember to brush your teeth WITH YOUR REGULAR TOOTHPASTE.   Please read over the following fact sheets that you were given.

## 2021-03-14 ENCOUNTER — Encounter (HOSPITAL_COMMUNITY): Payer: Self-pay

## 2021-03-14 ENCOUNTER — Other Ambulatory Visit: Payer: Self-pay

## 2021-03-14 ENCOUNTER — Encounter (HOSPITAL_COMMUNITY)
Admission: RE | Admit: 2021-03-14 | Discharge: 2021-03-14 | Disposition: A | Discharge status: Home / Self Care | Payer: BC Managed Care – PPO | Source: Ambulatory Visit | Attending: Neurosurgery | Admitting: Neurosurgery

## 2021-03-14 DIAGNOSIS — I10 Essential (primary) hypertension: Secondary | ICD-10-CM | POA: Insufficient documentation

## 2021-03-14 DIAGNOSIS — Z20822 Contact with and (suspected) exposure to covid-19: Secondary | ICD-10-CM | POA: Insufficient documentation

## 2021-03-14 DIAGNOSIS — Z01818 Encounter for other preprocedural examination: Secondary | ICD-10-CM | POA: Insufficient documentation

## 2021-03-14 HISTORY — DX: Other specified postprocedural states: Z98.890

## 2021-03-14 HISTORY — DX: Palpitations: R00.2

## 2021-03-14 HISTORY — DX: Nausea with vomiting, unspecified: R11.2

## 2021-03-14 LAB — BASIC METABOLIC PANEL
Anion gap: 4 — ABNORMAL LOW (ref 5–15)
BUN: 7 mg/dL (ref 6–20)
CO2: 28 mmol/L (ref 22–32)
Calcium: 9.4 mg/dL (ref 8.9–10.3)
Chloride: 105 mmol/L (ref 98–111)
Creatinine, Ser: 0.88 mg/dL (ref 0.44–1.00)
GFR, Estimated: >60 mL/min (ref >60)
Glucose, Bld: 92 mg/dL (ref 70–99)
Potassium: 4.4 mmol/L (ref 3.5–5.1)
Sodium: 137 mmol/L (ref 135–145)

## 2021-03-14 LAB — CBC
HCT: 40.8 % (ref 36.0–46.0)
Hemoglobin: 12.7 g/dL (ref 12.0–15.0)
MCH: 25.7 pg — ABNORMAL LOW (ref 26.0–34.0)
MCHC: 31.1 g/dL (ref 30.0–36.0)
MCV: 82.6 fL (ref 80.0–100.0)
Platelets: 259 10*3/uL (ref 150–400)
RBC: 4.94 MIL/uL (ref 3.87–5.11)
RDW: 13.3 % (ref 11.5–15.5)
WBC: 8.2 10*3/uL (ref 4.0–10.5)
nRBC: 0 % (ref 0.0–0.2)

## 2021-03-14 LAB — SARS CORONAVIRUS 2 (TAT 6-24 HRS): SARS Coronavirus 2: NEGATIVE

## 2021-03-14 LAB — SURGICAL PCR SCREEN
MRSA, PCR: NEGATIVE
Staphylococcus aureus: NEGATIVE

## 2021-03-14 NOTE — Progress Notes (Signed)
PCP - Glendale Chard, MD Cardiologist - Denies  PPM/ICD - Denies  Chest x-ray - N/A EKG - 03/14/21 Stress Test - Denies ECHO - Denies Cardiac Cath - Denies  Sleep Study - Denies   Pt states she is not pre-diabetic. In Epic chart, she was prediabetic 8 years ago, Dr. Baird Cancer has been checking her A1Cs regularly. A1C obtained today.  Blood Thinner Instructions: N/A Aspirin Instructions: N/A  ERAS Protcol - N/A PRE-SURGERY Ensure or G2- N/A  COVID TEST- 03/14/21 (Pt case posted as "Surgery Admit")   Anesthesia review: No  Patient denies shortness of breath, fever, cough and chest pain at PAT appointment   All instructions explained to the patient, with a verbal understanding of the material. Patient agrees to go over the instructions while at home for a better understanding. Patient also instructed to self quarantine after being tested for COVID-19. The opportunity to ask questions was provided.

## 2021-03-14 NOTE — Pre-Procedure Instructions (Signed)
Surgical Instructions:    Your procedure is scheduled on Friday, May 27th (09:30 AM- 2:45 PM).  Report to [facility] Main Entrance "A" at 07:30 A.M., then check in with the Admitting office.  Call this number if you have any questions prior to, or have any problems the morning of surgery:  [phone]    Remember:  Do not eat or drink after midnight the night before your surgery.     Take these medicines the morning of surgery with A SIP OF WATER: DULoxetine (CYMBALTA)  IF NEEDED: oxyCODONE-acetaminophen (PERCOCET/ROXICET)  albuterol (PROVENTIL HFA;VENTOLIN HFA) inhaler- Please bring all inhalers with you the day of surgery.     As of today, STOP taking any Aspirin (unless otherwise instructed by your surgeon) Aleve, Naproxen, Ibuprofen, Motrin, Advil, Goody's, BC's, all herbal medications, fish oil, and all vitamins.              Special instructions:   [place]- Preparing For Surgery  Before surgery, you can play an important role. Because skin is not sterile, your skin needs to be as free of germs as possible. You can reduce the number of germs on your skin by washing with CHG (chlorahexidine gluconate) Soap before surgery.  CHG is an antiseptic cleaner which kills germs and bonds with the skin to continue killing germs even after washing.    Oral Hygiene is also important to reduce your risk of infection.  Remember - BRUSH YOUR TEETH THE MORNING OF SURGERY WITH YOUR REGULAR TOOTHPASTE  Please do not use if you have an allergy to CHG or antibacterial soaps. If your skin becomes reddened/irritated stop using the CHG.  Do not shave (including legs and underarms) for at least 48 hours prior to first CHG shower. It is OK to shave your face.  Please follow these instructions carefully.   1. Shower the NIGHT BEFORE SURGERY and the MORNING OF SURGERY  2. If you chose to wash your hair, wash your hair first as usual with your normal shampoo.  3. After you shampoo, rinse  your hair and body thoroughly to remove the shampoo.  4. Wash Face and genitals (private parts) with your normal soap.   5. Use CHG Soap as you would any other liquid soap. You can apply CHG directly to the skin and wash gently with a scrungie or a clean washcloth.   6. Apply the CHG Soap to your body ONLY FROM THE NECK DOWN.  Do not use on open wounds or open sores. Avoid contact with your eyes, ears, mouth and genitals (private parts). Wash Face and genitals (private parts)  with your normal soap.   7. Wash thoroughly, paying special attention to the area where your surgery will be performed.  8. Thoroughly rinse your body with warm water from the neck down.  9. DO NOT shower/wash with your normal soap after using and rinsing off the CHG Soap.  10. Pat yourself dry with a CLEAN TOWEL.  11. Wear CLEAN PAJAMAS to bed the night before surgery.  12. Place CLEAN SHEETS on your bed the night before your surgery.  13. DO NOT SLEEP WITH PETS.   Day of Surgery: SHOWER with CHG soap. Brush your teeth WITH YOUR REGULAR TOOTHPASTE. Wear Clean/Comfortable clothing the morning of surgery. Do not apply any deodorants/lotions.   Do not wear jewelry, make up, or nail polish. Do not shave 48 hours prior to surgery.   Do not bring valuables to the hospital. [facility] is not responsible  for any belongings or valuables.   Do NOT Smoke (Tobacco/Vaping) or drink Alcohol 24 hours prior to your procedure.  If you use a CPAP at night, you may bring all equipment for your overnight stay.   Contacts, glasses, or dentures may not be worn into surgery, please bring cases for these belongings.   For patients admitted to the hospital, discharge time will be determined by your treatment team.   Patients discharged the day of surgery will not be allowed to drive home, and someone needs to stay with them for 24 hours.    Please read over the following fact sheets that you were given.

## 2021-03-15 LAB — HEMOGLOBIN A1C
Hgb A1c MFr Bld: 5.7 % — ABNORMAL HIGH (ref 4.8–5.6)
Mean Plasma Glucose: 117 mg/dL

## 2021-03-17 ENCOUNTER — Inpatient Hospital Stay (HOSPITAL_COMMUNITY): Payer: BC Managed Care – PPO

## 2021-03-17 ENCOUNTER — Encounter (HOSPITAL_COMMUNITY): Payer: Self-pay | Admitting: Neurosurgery

## 2021-03-17 ENCOUNTER — Inpatient Hospital Stay (HOSPITAL_COMMUNITY): Payer: BC Managed Care – PPO | Admitting: Certified Registered Nurse Anesthetist

## 2021-03-17 ENCOUNTER — Inpatient Hospital Stay (HOSPITAL_COMMUNITY)
Admission: RE | Admit: 2021-03-17 | Discharge: 2021-03-24 | DRG: 030 | Disposition: A | Discharge status: Rehabilitation Facility | Payer: BC Managed Care – PPO | Attending: Neurosurgery | Admitting: Neurosurgery

## 2021-03-17 ENCOUNTER — Encounter (HOSPITAL_COMMUNITY)
Admission: RE | Disposition: A | Discharge status: Rehabilitation Facility | Payer: Self-pay | Source: Home / Self Care | Attending: Neurosurgery

## 2021-03-17 ENCOUNTER — Other Ambulatory Visit: Payer: Self-pay

## 2021-03-17 DIAGNOSIS — Z883 Allergy status to other anti-infective agents status: Secondary | ICD-10-CM

## 2021-03-17 DIAGNOSIS — Z79899 Other long term (current) drug therapy: Secondary | ICD-10-CM | POA: Diagnosis not present

## 2021-03-17 DIAGNOSIS — Z91018 Allergy to other foods: Secondary | ICD-10-CM | POA: Diagnosis not present

## 2021-03-17 DIAGNOSIS — C729 Malignant neoplasm of central nervous system, unspecified: Secondary | ICD-10-CM

## 2021-03-17 DIAGNOSIS — F32A Depression, unspecified: Secondary | ICD-10-CM | POA: Diagnosis present

## 2021-03-17 DIAGNOSIS — N3289 Other specified disorders of bladder: Secondary | ICD-10-CM | POA: Diagnosis not present

## 2021-03-17 DIAGNOSIS — Z8249 Family history of ischemic heart disease and other diseases of the circulatory system: Secondary | ICD-10-CM | POA: Diagnosis not present

## 2021-03-17 DIAGNOSIS — C720 Malignant neoplasm of spinal cord: Secondary | ICD-10-CM | POA: Diagnosis not present

## 2021-03-17 DIAGNOSIS — Z20822 Contact with and (suspected) exposure to covid-19: Secondary | ICD-10-CM | POA: Diagnosis present

## 2021-03-17 DIAGNOSIS — Z91012 Allergy to eggs: Secondary | ICD-10-CM

## 2021-03-17 DIAGNOSIS — G8918 Other acute postprocedural pain: Secondary | ICD-10-CM | POA: Diagnosis not present

## 2021-03-17 DIAGNOSIS — R030 Elevated blood-pressure reading, without diagnosis of hypertension: Secondary | ICD-10-CM

## 2021-03-17 DIAGNOSIS — E669 Obesity, unspecified: Secondary | ICD-10-CM | POA: Diagnosis present

## 2021-03-17 DIAGNOSIS — Z91040 Latex allergy status: Secondary | ICD-10-CM | POA: Diagnosis not present

## 2021-03-17 DIAGNOSIS — I10 Essential (primary) hypertension: Secondary | ICD-10-CM | POA: Diagnosis present

## 2021-03-17 DIAGNOSIS — F988 Other specified behavioral and emotional disorders with onset usually occurring in childhood and adolescence: Secondary | ICD-10-CM | POA: Diagnosis present

## 2021-03-17 DIAGNOSIS — M5416 Radiculopathy, lumbar region: Secondary | ICD-10-CM | POA: Diagnosis not present

## 2021-03-17 DIAGNOSIS — E785 Hyperlipidemia, unspecified: Secondary | ICD-10-CM | POA: Diagnosis present

## 2021-03-17 DIAGNOSIS — Z6839 Body mass index (BMI) 39.0-39.9, adult: Secondary | ICD-10-CM | POA: Diagnosis not present

## 2021-03-17 DIAGNOSIS — M62838 Other muscle spasm: Secondary | ICD-10-CM | POA: Diagnosis present

## 2021-03-17 DIAGNOSIS — F419 Anxiety disorder, unspecified: Secondary | ICD-10-CM | POA: Diagnosis present

## 2021-03-17 DIAGNOSIS — Z888 Allergy status to other drugs, medicaments and biological substances status: Secondary | ICD-10-CM | POA: Diagnosis not present

## 2021-03-17 DIAGNOSIS — Z818 Family history of other mental and behavioral disorders: Secondary | ICD-10-CM

## 2021-03-17 DIAGNOSIS — N319 Neuromuscular dysfunction of bladder, unspecified: Secondary | ICD-10-CM | POA: Diagnosis not present

## 2021-03-17 DIAGNOSIS — C701 Malignant neoplasm of spinal meninges: Secondary | ICD-10-CM | POA: Diagnosis present

## 2021-03-17 DIAGNOSIS — D72825 Bandemia: Secondary | ICD-10-CM | POA: Diagnosis not present

## 2021-03-17 DIAGNOSIS — Z9079 Acquired absence of other genital organ(s): Secondary | ICD-10-CM | POA: Diagnosis not present

## 2021-03-17 DIAGNOSIS — Z419 Encounter for procedure for purposes other than remedying health state, unspecified: Secondary | ICD-10-CM

## 2021-03-17 HISTORY — DX: Malignant neoplasm of central nervous system, unspecified: C72.9

## 2021-03-17 HISTORY — PX: LAMINECTOMY: SHX219

## 2021-03-17 LAB — GLUCOSE, CAPILLARY: Glucose-Capillary: 105 mg/dL — ABNORMAL HIGH (ref 70–99)

## 2021-03-17 LAB — POCT PREGNANCY, URINE: Preg Test, Ur: NEGATIVE

## 2021-03-17 SURGERY — LUMBAR LAMINECTOMY FOR TUMOR
Anesthesia: General

## 2021-03-17 MED ORDER — IBUPROFEN 200 MG PO TABS
800.0000 mg | ORAL_TABLET | Freq: Three times a day (TID) | ORAL | Status: DC | PRN
  Administered 2021-03-19 – 2021-03-23 (×6): 800 mg via ORAL
  Filled 2021-03-17 (×6): qty 4

## 2021-03-17 MED ORDER — KETOROLAC TROMETHAMINE 15 MG/ML IJ SOLN
INTRAMUSCULAR | Status: AC
  Administered 2021-03-17: 15 mg via INTRAVENOUS
  Filled 2021-03-17: qty 1

## 2021-03-17 MED ORDER — DEXAMETHASONE SODIUM PHOSPHATE 10 MG/ML IJ SOLN
INTRAMUSCULAR | Status: AC
  Filled 2021-03-17: qty 1

## 2021-03-17 MED ORDER — ONDANSETRON 4 MG PO TBDP: 4.0000 mg | ORAL_TABLET | Freq: Three times a day (TID) | ORAL | Status: DC | PRN

## 2021-03-17 MED ORDER — PANTOPRAZOLE SODIUM 40 MG IV SOLR
40.0000 mg | Freq: Every day | INTRAVENOUS | Status: DC
  Administered 2021-03-17: 40 mg via INTRAVENOUS
  Filled 2021-03-17: qty 40

## 2021-03-17 MED ORDER — PROPOFOL 10 MG/ML IV BOLUS
INTRAVENOUS | Status: AC
  Filled 2021-03-17: qty 40

## 2021-03-17 MED ORDER — PHENOL 1.4 % MT LIQD: 1.0000 | OROMUCOSAL | Status: DC | PRN

## 2021-03-17 MED ORDER — HYDROMORPHONE HCL 1 MG/ML IJ SOLN
INTRAMUSCULAR | Status: AC
  Administered 2021-03-17: 0.5 mg via INTRAVENOUS
  Filled 2021-03-17: qty 1

## 2021-03-17 MED ORDER — CHLORHEXIDINE GLUCONATE 0.12 % MT SOLN
15.0000 mL | Freq: Once | OROMUCOSAL | Status: AC
  Administered 2021-03-17: 15 mL via OROMUCOSAL
  Filled 2021-03-17: qty 15

## 2021-03-17 MED ORDER — ACETAMINOPHEN 650 MG RE SUPP: 650.0000 mg | RECTAL | Status: DC | PRN

## 2021-03-17 MED ORDER — CEFAZOLIN SODIUM-DEXTROSE 2-4 GM/100ML-% IV SOLN: 2.0000 g | Freq: Three times a day (TID) | INTRAVENOUS | Status: DC

## 2021-03-17 MED ORDER — VANCOMYCIN HCL IN DEXTROSE 1-5 GM/200ML-% IV SOLN
1000.0000 mg | INTRAVENOUS | Status: AC
  Administered 2021-03-17: 1000 mg via INTRAVENOUS

## 2021-03-17 MED ORDER — THROMBIN 5000 UNITS EX SOLR
CUTANEOUS | Status: AC
  Filled 2021-03-17: qty 15000

## 2021-03-17 MED ORDER — ONDANSETRON HCL 4 MG/2ML IJ SOLN
INTRAMUSCULAR | Status: AC
  Filled 2021-03-17: qty 2

## 2021-03-17 MED ORDER — ONDANSETRON HCL 4 MG PO TABS
4.0000 mg | ORAL_TABLET | Freq: Four times a day (QID) | ORAL | Status: DC | PRN
  Administered 2021-03-20 – 2021-03-21 (×2): 4 mg via ORAL
  Filled 2021-03-17 (×2): qty 1

## 2021-03-17 MED ORDER — CHLORHEXIDINE GLUCONATE CLOTH 2 % EX PADS: 6.0000 | MEDICATED_PAD | Freq: Once | CUTANEOUS | Status: DC

## 2021-03-17 MED ORDER — LIDOCAINE-EPINEPHRINE 1 %-1:100000 IJ SOLN
INTRAMUSCULAR | Status: AC
  Filled 2021-03-17: qty 1

## 2021-03-17 MED ORDER — ACETAMINOPHEN 325 MG PO TABS
650.0000 mg | ORAL_TABLET | ORAL | Status: DC | PRN
  Administered 2021-03-18: 650 mg via ORAL
  Filled 2021-03-17: qty 2

## 2021-03-17 MED ORDER — OXYCODONE-ACETAMINOPHEN 5-325 MG PO TABS
1.0000 | ORAL_TABLET | Freq: Four times a day (QID) | ORAL | Status: DC | PRN
  Administered 2021-03-18 – 2021-03-23 (×13): 2 via ORAL
  Administered 2021-03-23: 1 via ORAL
  Filled 2021-03-17 (×6): qty 2
  Filled 2021-03-17: qty 1
  Filled 2021-03-17 (×7): qty 2

## 2021-03-17 MED ORDER — ONDANSETRON HCL 4 MG/2ML IJ SOLN
INTRAMUSCULAR | Status: DC | PRN
  Administered 2021-03-17: 4 mg via INTRAVENOUS

## 2021-03-17 MED ORDER — CYCLOBENZAPRINE HCL 10 MG PO TABS
ORAL_TABLET | ORAL | Status: AC
  Filled 2021-03-17: qty 1

## 2021-03-17 MED ORDER — THROMBIN 5000 UNITS EX SOLR
CUTANEOUS | Status: AC
  Filled 2021-03-17: qty 10000

## 2021-03-17 MED ORDER — ACETAMINOPHEN 10 MG/ML IV SOLN: 1000.0000 mg | Freq: Once | INTRAVENOUS | Status: DC | PRN

## 2021-03-17 MED ORDER — OXYCODONE HCL 5 MG/5ML PO SOLN: 5.0000 mg | Freq: Once | ORAL | Status: AC | PRN

## 2021-03-17 MED ORDER — ORAL CARE MOUTH RINSE: 15.0000 mL | Freq: Once | OROMUCOSAL | Status: DC

## 2021-03-17 MED ORDER — ACETAMINOPHEN 10 MG/ML IV SOLN
INTRAVENOUS | Status: AC
  Administered 2021-03-17: 1000 mg via INTRAVENOUS
  Filled 2021-03-17: qty 100

## 2021-03-17 MED ORDER — SODIUM CHLORIDE 0.9% FLUSH
3.0000 mL | Freq: Two times a day (BID) | INTRAVENOUS | Status: DC
  Administered 2021-03-17 – 2021-03-24 (×14): 3 mL via INTRAVENOUS

## 2021-03-17 MED ORDER — PHENYLEPHRINE 40 MCG/ML (10ML) SYRINGE FOR IV PUSH (FOR BLOOD PRESSURE SUPPORT)
PREFILLED_SYRINGE | INTRAVENOUS | Status: AC
  Filled 2021-03-17: qty 10

## 2021-03-17 MED ORDER — ACETAMINOPHEN 325 MG PO TABS: 325.0000 mg | ORAL_TABLET | Freq: Once | ORAL | Status: DC | PRN

## 2021-03-17 MED ORDER — MIDAZOLAM HCL 2 MG/2ML IJ SOLN
INTRAMUSCULAR | Status: AC
  Filled 2021-03-17: qty 2

## 2021-03-17 MED ORDER — VANCOMYCIN HCL 1250 MG/250ML IV SOLN
1250.0000 mg | Freq: Two times a day (BID) | INTRAVENOUS | Status: AC
  Administered 2021-03-17 – 2021-03-18 (×2): 1250 mg via INTRAVENOUS
  Filled 2021-03-17 (×2): qty 250

## 2021-03-17 MED ORDER — MIDAZOLAM HCL 2 MG/2ML IJ SOLN: 1.0000 mg | Freq: Once | INTRAMUSCULAR | Status: AC

## 2021-03-17 MED ORDER — MIDAZOLAM HCL 5 MG/5ML IJ SOLN
INTRAMUSCULAR | Status: DC | PRN
  Administered 2021-03-17: 2 mg via INTRAVENOUS

## 2021-03-17 MED ORDER — DEXMEDETOMIDINE (PRECEDEX) IN NS 20 MCG/5ML (4 MCG/ML) IV SYRINGE
PREFILLED_SYRINGE | INTRAVENOUS | Status: AC
  Filled 2021-03-17: qty 5

## 2021-03-17 MED ORDER — HYDROMORPHONE HCL 1 MG/ML IJ SOLN
0.2500 mg | INTRAMUSCULAR | Status: DC | PRN
  Administered 2021-03-17: 0.25 mg via INTRAVENOUS
  Administered 2021-03-17 (×2): 0.5 mg via INTRAVENOUS

## 2021-03-17 MED ORDER — FENTANYL CITRATE (PF) 250 MCG/5ML IJ SOLN
INTRAMUSCULAR | Status: AC
  Filled 2021-03-17: qty 5

## 2021-03-17 MED ORDER — HYDROMORPHONE HCL 1 MG/ML IJ SOLN
INTRAMUSCULAR | Status: AC
  Administered 2021-03-17: 0.25 mg via INTRAVENOUS
  Filled 2021-03-17: qty 1

## 2021-03-17 MED ORDER — ROCURONIUM BROMIDE 10 MG/ML (PF) SYRINGE
PREFILLED_SYRINGE | INTRAVENOUS | Status: AC
  Filled 2021-03-17: qty 10

## 2021-03-17 MED ORDER — VANCOMYCIN HCL IN DEXTROSE 1-5 GM/200ML-% IV SOLN
INTRAVENOUS | Status: AC
  Filled 2021-03-17: qty 200

## 2021-03-17 MED ORDER — OXYCODONE HCL 5 MG PO TABS
ORAL_TABLET | ORAL | Status: AC
  Administered 2021-03-17: 5 mg via ORAL
  Filled 2021-03-17: qty 1

## 2021-03-17 MED ORDER — MEPERIDINE HCL 25 MG/ML IJ SOLN: 6.2500 mg | INTRAMUSCULAR | Status: DC | PRN

## 2021-03-17 MED ORDER — OXYCODONE-ACETAMINOPHEN 5-325 MG PO TABS
ORAL_TABLET | ORAL | Status: AC
  Administered 2021-03-17: 2 via ORAL
  Filled 2021-03-17: qty 2

## 2021-03-17 MED ORDER — PROMETHAZINE HCL 25 MG/ML IJ SOLN: 6.2500 mg | INTRAMUSCULAR | Status: DC | PRN

## 2021-03-17 MED ORDER — OXYCODONE HCL 5 MG PO TABS
10.0000 mg | ORAL_TABLET | ORAL | Status: DC | PRN
Start: 2021-03-17 — End: 2021-03-24
  Administered 2021-03-18 – 2021-03-24 (×23): 10 mg via ORAL
  Filled 2021-03-17 (×24): qty 2

## 2021-03-17 MED ORDER — PHENYLEPHRINE 40 MCG/ML (10ML) SYRINGE FOR IV PUSH (FOR BLOOD PRESSURE SUPPORT)
PREFILLED_SYRINGE | INTRAVENOUS | Status: DC | PRN
  Administered 2021-03-17 (×2): 80 ug via INTRAVENOUS

## 2021-03-17 MED ORDER — BUPIVACAINE HCL (PF) 0.25 % IJ SOLN
INTRAMUSCULAR | Status: AC
  Filled 2021-03-17: qty 30

## 2021-03-17 MED ORDER — ACETAMINOPHEN 160 MG/5ML PO SOLN: 325.0000 mg | Freq: Once | ORAL | Status: DC | PRN

## 2021-03-17 MED ORDER — OXYCODONE HCL 5 MG PO TABS: 5.0000 mg | ORAL_TABLET | Freq: Once | ORAL | Status: AC | PRN

## 2021-03-17 MED ORDER — ORAL CARE MOUTH RINSE: 15.0000 mL | Freq: Once | OROMUCOSAL | Status: AC

## 2021-03-17 MED ORDER — SUGAMMADEX SODIUM 200 MG/2ML IV SOLN
INTRAVENOUS | Status: DC | PRN
  Administered 2021-03-17: 250 mg via INTRAVENOUS

## 2021-03-17 MED ORDER — LACTATED RINGERS IV SOLN: INTRAVENOUS | Status: DC

## 2021-03-17 MED ORDER — ALBUTEROL SULFATE HFA 108 (90 BASE) MCG/ACT IN AERS: 2.0000 | INHALATION_SPRAY | Freq: Four times a day (QID) | RESPIRATORY_TRACT | Status: DC | PRN

## 2021-03-17 MED ORDER — ROCURONIUM BROMIDE 10 MG/ML (PF) SYRINGE
PREFILLED_SYRINGE | INTRAVENOUS | Status: DC | PRN
  Administered 2021-03-17: 20 mg via INTRAVENOUS
  Administered 2021-03-17: 30 mg via INTRAVENOUS
  Administered 2021-03-17: 50 mg via INTRAVENOUS

## 2021-03-17 MED ORDER — LIDOCAINE 2% (20 MG/ML) 5 ML SYRINGE
INTRAMUSCULAR | Status: AC
  Filled 2021-03-17: qty 5

## 2021-03-17 MED ORDER — KETOROLAC TROMETHAMINE 15 MG/ML IJ SOLN: 15.0000 mg | Freq: Once | INTRAMUSCULAR | Status: AC

## 2021-03-17 MED ORDER — SODIUM CHLORIDE 0.9% FLUSH
3.0000 mL | INTRAVENOUS | Status: DC | PRN
  Administered 2021-03-18: 3 mL via INTRAVENOUS

## 2021-03-17 MED ORDER — MIDAZOLAM HCL 2 MG/2ML IJ SOLN: 2.0000 mg | Freq: Once | INTRAMUSCULAR | Status: DC

## 2021-03-17 MED ORDER — CHLORHEXIDINE GLUCONATE 0.12 % MT SOLN: 15.0000 mL | Freq: Once | OROMUCOSAL | Status: DC

## 2021-03-17 MED ORDER — CYCLOBENZAPRINE HCL 10 MG PO TABS
10.0000 mg | ORAL_TABLET | Freq: Three times a day (TID) | ORAL | Status: DC | PRN
  Administered 2021-03-18 – 2021-03-19 (×3): 10 mg via ORAL
  Filled 2021-03-17 (×3): qty 1

## 2021-03-17 MED ORDER — CYCLOBENZAPRINE HCL 10 MG PO TABS
10.0000 mg | ORAL_TABLET | Freq: Every day | ORAL | Status: DC
  Administered 2021-03-17 – 2021-03-23 (×6): 10 mg via ORAL
  Filled 2021-03-17 (×6): qty 1

## 2021-03-17 MED ORDER — HYDROMORPHONE HCL 1 MG/ML IJ SOLN: 0.5000 mg | Freq: Once | INTRAMUSCULAR | Status: AC

## 2021-03-17 MED ORDER — LACTATED RINGERS IV SOLN: INTRAVENOUS | Status: DC | PRN

## 2021-03-17 MED ORDER — HYDROMORPHONE HCL 1 MG/ML IJ SOLN
0.5000 mg | INTRAMUSCULAR | Status: DC | PRN
  Administered 2021-03-17 – 2021-03-22 (×19): 0.5 mg via INTRAVENOUS
  Filled 2021-03-17 (×19): qty 1

## 2021-03-17 MED ORDER — AMISULPRIDE (ANTIEMETIC) 5 MG/2ML IV SOLN
10.0000 mg | Freq: Once | INTRAVENOUS | Status: DC | PRN
Start: 2021-03-17 — End: 2021-03-17

## 2021-03-17 MED ORDER — DULOXETINE HCL 60 MG PO CPEP
60.0000 mg | ORAL_CAPSULE | Freq: Every day | ORAL | Status: DC
  Administered 2021-03-18 – 2021-03-24 (×7): 60 mg via ORAL
  Filled 2021-03-17 (×7): qty 1

## 2021-03-17 MED ORDER — LIDOCAINE 2% (20 MG/ML) 5 ML SYRINGE
INTRAMUSCULAR | Status: DC | PRN
  Administered 2021-03-17: 40 mg via INTRAVENOUS

## 2021-03-17 MED ORDER — MIDAZOLAM HCL 2 MG/2ML IJ SOLN
INTRAMUSCULAR | Status: AC
  Administered 2021-03-17: 1 mg via INTRAVENOUS
  Filled 2021-03-17: qty 2

## 2021-03-17 MED ORDER — THROMBIN 5000 UNITS EX SOLR
CUTANEOUS | Status: DC | PRN
  Administered 2021-03-17 (×4): 5000 [IU] via TOPICAL

## 2021-03-17 MED ORDER — SCOPOLAMINE 1 MG/3DAYS TD PT72
MEDICATED_PATCH | TRANSDERMAL | Status: DC | PRN
  Administered 2021-03-17: 1 via TRANSDERMAL

## 2021-03-17 MED ORDER — SODIUM CHLORIDE 0.9 % IV SOLN
250.0000 mL | INTRAVENOUS | Status: DC
  Administered 2021-03-17: 250 mL via INTRAVENOUS

## 2021-03-17 MED ORDER — LISDEXAMFETAMINE DIMESYLATE 70 MG PO CAPS
70.0000 mg | ORAL_CAPSULE | Freq: Every morning | ORAL | Status: DC
  Administered 2021-03-18 – 2021-03-24 (×7): 70 mg via ORAL
  Filled 2021-03-17 (×7): qty 1

## 2021-03-17 MED ORDER — DEXAMETHASONE SODIUM PHOSPHATE 10 MG/ML IJ SOLN
10.0000 mg | Freq: Once | INTRAMUSCULAR | Status: AC
  Administered 2021-03-17: 10 mg via INTRAVENOUS

## 2021-03-17 MED ORDER — HEMOSTATIC AGENTS (NO CHARGE) OPTIME
TOPICAL | Status: DC | PRN
  Administered 2021-03-17 (×2): 1 via TOPICAL

## 2021-03-17 MED ORDER — PROPOFOL 10 MG/ML IV BOLUS
INTRAVENOUS | Status: DC | PRN
  Administered 2021-03-17: 170 mg via INTRAVENOUS

## 2021-03-17 MED ORDER — FENTANYL CITRATE (PF) 250 MCG/5ML IJ SOLN
INTRAMUSCULAR | Status: DC | PRN
  Administered 2021-03-17 (×4): 50 ug via INTRAVENOUS
  Administered 2021-03-17: 100 ug via INTRAVENOUS
  Administered 2021-03-17: 50 ug via INTRAVENOUS

## 2021-03-17 MED ORDER — MENTHOL 3 MG MT LOZG: 1.0000 | LOZENGE | OROMUCOSAL | Status: DC | PRN

## 2021-03-17 MED ORDER — ONDANSETRON HCL 4 MG/2ML IJ SOLN
4.0000 mg | Freq: Four times a day (QID) | INTRAMUSCULAR | Status: DC | PRN
  Administered 2021-03-18: 4 mg via INTRAVENOUS
  Filled 2021-03-17: qty 2

## 2021-03-17 MED ORDER — 0.9 % SODIUM CHLORIDE (POUR BTL) OPTIME
TOPICAL | Status: DC | PRN
  Administered 2021-03-17: 1000 mL

## 2021-03-17 MED ORDER — ALUM & MAG HYDROXIDE-SIMETH 200-200-20 MG/5ML PO SUSP: 30.0000 mL | Freq: Four times a day (QID) | ORAL | Status: DC | PRN

## 2021-03-17 MED ORDER — LIDOCAINE-EPINEPHRINE 1 %-1:100000 IJ SOLN
INTRAMUSCULAR | Status: DC | PRN
  Administered 2021-03-17: 10 mL

## 2021-03-17 MED ORDER — THROMBIN 5000 UNITS EX SOLR
OROMUCOSAL | Status: DC | PRN
  Administered 2021-03-17: 5 mL via TOPICAL

## 2021-03-17 MED ORDER — SCOPOLAMINE 1 MG/3DAYS TD PT72
MEDICATED_PATCH | TRANSDERMAL | Status: AC
  Filled 2021-03-17: qty 1

## 2021-03-17 MED ORDER — DEXMEDETOMIDINE (PRECEDEX) IN NS 20 MCG/5ML (4 MCG/ML) IV SYRINGE
PREFILLED_SYRINGE | INTRAVENOUS | Status: DC | PRN
  Administered 2021-03-17 (×3): 8 ug via INTRAVENOUS
  Administered 2021-03-17: 4 ug via INTRAVENOUS

## 2021-03-17 SURGICAL SUPPLY — 80 items
ADH SKN CLS APL DERMABOND .7 (GAUZE/BANDAGES/DRESSINGS) ×1
APL SKNCLS STERI-STRIP NONHPOA (GAUZE/BANDAGES/DRESSINGS) ×1
BAND INSRT 18 STRL LF DISP RB (MISCELLANEOUS) ×2
BAND RUBBER #18 3X1/16 STRL (MISCELLANEOUS) ×6 IMPLANT
BENZOIN TINCTURE PRP APPL 2/3 (GAUZE/BANDAGES/DRESSINGS) ×3 IMPLANT
BLADE CLIPPER SURG (BLADE) IMPLANT
BLADE SURG 11 STRL SS (BLADE) ×3 IMPLANT
BUR CUTTER 7.0 ROUND (BURR) ×2 IMPLANT
BUR MATCHSTICK NEURO 3.0 LAGG (BURR) ×2 IMPLANT
BUR PRECISION FLUTE 6.0 (BURR) IMPLANT
CANISTER SUCT 3000ML PPV (MISCELLANEOUS) ×3 IMPLANT
CARTRIDGE OIL MAESTRO DRILL (MISCELLANEOUS) ×2 IMPLANT
COVER WAND RF STERILE (DRAPES) ×2 IMPLANT
DECANTER SPIKE VIAL GLASS SM (MISCELLANEOUS) ×3 IMPLANT
DERMABOND ADVANCED (GAUZE/BANDAGES/DRESSINGS) ×1
DERMABOND ADVANCED .7 DNX12 (GAUZE/BANDAGES/DRESSINGS) ×2 IMPLANT
DIFFUSER DRILL AIR PNEUMATIC (MISCELLANEOUS) ×3 IMPLANT
DRAPE HALF SHEET 40X57 (DRAPES) IMPLANT
DRAPE LAPAROTOMY 100X72 PEDS (DRAPES) IMPLANT
DRAPE LAPAROTOMY 100X72X124 (DRAPES) ×3 IMPLANT
DRAPE MICROSCOPE LEICA (MISCELLANEOUS) ×4 IMPLANT
DRAPE SURG 17X23 STRL (DRAPES) ×4 IMPLANT
DRSG OPSITE POSTOP 4X6 (GAUZE/BANDAGES/DRESSINGS) ×1 IMPLANT
DURAPREP 26ML APPLICATOR (WOUND CARE) ×2 IMPLANT
ELECT BLADE 4.0 EZ CLEAN MEGAD (MISCELLANEOUS) ×2
ELECT REM PT RETURN 9FT ADLT (ELECTROSURGICAL) ×2
ELECTRODE BLDE 4.0 EZ CLN MEGD (MISCELLANEOUS) IMPLANT
ELECTRODE REM PT RTRN 9FT ADLT (ELECTROSURGICAL) ×2 IMPLANT
GAUZE 4X4 16PLY RFD (DISPOSABLE) ×1 IMPLANT
GAUZE SPONGE 4X4 12PLY STRL (GAUZE/BANDAGES/DRESSINGS) ×4 IMPLANT
GLOVE BIO SURGEON STRL SZ7 (GLOVE) IMPLANT
GLOVE BIO SURGEON STRL SZ8 (GLOVE) ×3 IMPLANT
GLOVE EXAM NITRILE XL STR (GLOVE) IMPLANT
GLOVE INDICATOR 8.5 STRL (GLOVE) ×3 IMPLANT
GLOVE SURG POLYISO LF SZ6 (GLOVE) ×1 IMPLANT
GLOVE SURG POLYISO LF SZ7 (GLOVE) ×1 IMPLANT
GLOVE SURG UNDER POLY LF SZ7 (GLOVE) IMPLANT
GLOVE SURG UNDER POLY LF SZ7.5 (GLOVE) ×1 IMPLANT
GOWN STRL REUS W/ TWL LRG LVL3 (GOWN DISPOSABLE) ×2 IMPLANT
GOWN STRL REUS W/ TWL XL LVL3 (GOWN DISPOSABLE) ×3 IMPLANT
GOWN STRL REUS W/TWL 2XL LVL3 (GOWN DISPOSABLE) ×2 IMPLANT
GOWN STRL REUS W/TWL LRG LVL3 (GOWN DISPOSABLE) ×4
GOWN STRL REUS W/TWL XL LVL3 (GOWN DISPOSABLE) ×6
HEMOSTAT POWDER KIT SURGIFOAM (HEMOSTASIS) ×1 IMPLANT
HEMOSTAT SURGICEL 2X14 (HEMOSTASIS) IMPLANT
KIT BASIN OR (CUSTOM PROCEDURE TRAY) ×3 IMPLANT
KIT TURNOVER KIT B (KITS) ×3 IMPLANT
NDL HYPO 25X1 1.5 SAFETY (NEEDLE) ×1 IMPLANT
NDL SPNL 20GX3.5 QUINCKE YW (NEEDLE) IMPLANT
NDL SPNL 22GX3.5 QUINCKE BK (NEEDLE) ×1 IMPLANT
NEEDLE HYPO 22GX1.5 SAFETY (NEEDLE) ×3 IMPLANT
NEEDLE HYPO 25X1 1.5 SAFETY (NEEDLE) IMPLANT
NEEDLE SPNL 20GX3.5 QUINCKE YW (NEEDLE) IMPLANT
NEEDLE SPNL 22GX3.5 QUINCKE BK (NEEDLE) ×2 IMPLANT
NS IRRIG 1000ML POUR BTL (IV SOLUTION) ×3 IMPLANT
OIL CARTRIDGE MAESTRO DRILL (MISCELLANEOUS) ×2
PACK LAMINECTOMY NEURO (CUSTOM PROCEDURE TRAY) ×3 IMPLANT
PATTIES SURGICAL .5 X.5 (GAUZE/BANDAGES/DRESSINGS) ×1 IMPLANT
PATTIES SURGICAL .5 X3 (DISPOSABLE) ×2 IMPLANT
SEALANT ADHERUS EXTEND TIP (MISCELLANEOUS) ×1 IMPLANT
SPONGE LAP 4X18 RFD (DISPOSABLE) IMPLANT
SPONGE SURGIFOAM ABS GEL 100 (HEMOSTASIS) ×1 IMPLANT
SPONGE SURGIFOAM ABS GEL SZ50 (HEMOSTASIS) ×3 IMPLANT
STAPLER VISISTAT 35W (STAPLE) ×2 IMPLANT
STRIP CLOSURE SKIN 1/2X4 (GAUZE/BANDAGES/DRESSINGS) ×3 IMPLANT
SUT BONE WAX W31G (SUTURE) IMPLANT
SUT ETHILON 4 0 PS 2 18 (SUTURE) ×2 IMPLANT
SUT NURALON 4 0 TR CR/8 (SUTURE) ×3 IMPLANT
SUT PROLENE 6 0 BV (SUTURE) IMPLANT
SUT VIC AB 0 CT1 18XCR BRD8 (SUTURE) ×1 IMPLANT
SUT VIC AB 0 CT1 8-18 (SUTURE) ×4
SUT VIC AB 2-0 CT1 18 (SUTURE) ×4 IMPLANT
SUT VIC AB 3-0 SH 8-18 (SUTURE) IMPLANT
SUT VICRYL 4-0 PS2 18IN ABS (SUTURE) ×2 IMPLANT
TOWEL GREEN STERILE (TOWEL DISPOSABLE) ×3 IMPLANT
TOWEL GREEN STERILE FF (TOWEL DISPOSABLE) ×3 IMPLANT
TRAY FOL W/BAG SLVR 16FR STRL (SET/KITS/TRAYS/PACK) IMPLANT
TRAY FOLEY MTR SLVR 16FR STAT (SET/KITS/TRAYS/PACK) ×1 IMPLANT
TRAY FOLEY W/BAG SLVR 16FR LF (SET/KITS/TRAYS/PACK) ×2
WATER STERILE IRR 1000ML POUR (IV SOLUTION) ×3 IMPLANT

## 2021-03-17 NOTE — Op Note (Signed)
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  Due to patient's progression of clinical syndrome imaging findings failed conservative treatment I recommended decompressive laminectomy and resection of intradural tumor.  I extensively went over the risks and benefits of that operation with her as well as perioperative course expectations of outcome and alternatives of surgery and she understood and agreed to proceed forward.  Operative procedure: Patient was brought into the OR was Duson general anesthesia positioned prone on the Wilson frame her back was prepped and draped in routine sterile fashion preoperative x-ray localized the appropriate level so after infiltration of 10 cc lidocaine with epi midline incision was made and Bovie electrocautery was used take down the subcutaneous tissue and subperiosteal dissection was carried lamina of L3-L4 and L5 bilaterally.  Intraoperative x-ray confirmed identification appropriate level so then I remove the spinous process at L4 and L5 performed complete central decompressive laminectomy at L4 and remove the superior two thirds of the lamina of L5 under bit the medial gutters to gain access to the lateral margin of the canal.  Then opened up the dura in the midline tacked up the dura and immediately identified the tumor.  The operating microscope was draped and brought into the field and under microscope illumination I dissected nerve roots off of the tumor that was originating  from the filum.  There was not densely adherent to the neighboring nerve roots.  Both proximally and distally the tumor was intrinsic in the that section of the filum so the filum was then divided proximally and distally the tumor was removed en bloc and sent to pathology for frozen and permanent.  Frozen came back consistent with neoplasm probable ependymoma.  Then the intradural space was copiously irrigated the dura was reapproximated with a running Nurolon then the DuraSeal green glue was placed over the suture line as well as Gelfoam and the wound was closed in layers with interrupted Vicryl and a running 4 subcuticular.  Dermabond benzoin Steri-Strips and a sterile dressing was applied patient recovery in stable condition.  At the end the case all needle count sponge counts were correct.

## 2021-03-17 NOTE — Anesthesia Procedure Notes (Signed)
Procedure Name: Intubation Date/Time: 03/17/2021 10:01 AM Performed by: Colin Benton, CRNA Pre-anesthesia Checklist: Patient identified, Emergency Drugs available, Suction available and Patient being monitored Patient Re-evaluated:Patient Re-evaluated prior to induction Oxygen Delivery Method: Circle system utilized Preoxygenation: Pre-oxygenation with 100% oxygen Induction Type: IV induction Ventilation: Mask ventilation without difficulty Laryngoscope Size: Miller and 2 Grade View: Grade I Tube type: Oral Tube size: 7.0 mm Number of attempts: 1 Airway Equipment and Method: Stylet Placement Confirmation: ETT inserted through vocal cords under direct vision,  positive ETCO2 and breath sounds checked- equal and bilateral Secured at: 22 (At teeth) cm Tube secured with: Tape Dental Injury: Teeth and Oropharynx as per pre-operative assessment

## 2021-03-17 NOTE — Transfer of Care (Signed)
Immediate Anesthesia Transfer of Care Note  Patient: Shannon [name]  Procedure(s) Performed: Laminectomy - Lumbar Four-Lumbar Five for Intradural mass (N/A )  Patient Location: PACU  Anesthesia Type:General  Level of Consciousness: awake, alert , oriented and patient cooperative  Airway & Oxygen Therapy: Patient Spontanous Breathing and Patient connected to nasal cannula oxygen  Post-op Assessment: Report given to RN, Post -op Vital signs reviewed and stable and Patient moving all extremities X 4  Post vital signs: Reviewed and stable  Last Vitals:  Vitals Value Taken Time  BP 143/89 03/17/21 1245  Temp    Pulse 103 03/17/21 1247  Resp 31 03/17/21 1247  SpO2 100 % 03/17/21 1247  Vitals shown include unvalidated device data.  Last Pain:  Vitals:   03/17/21 0756  TempSrc:   PainSc: 6          Complications: No complications documented.

## 2021-03-17 NOTE — Anesthesia Preprocedure Evaluation (Addendum)
Anesthesia Evaluation  Patient identified by MRN, date of birth, ID band Patient awake    Reviewed: Allergy & Precautions, NPO status , Patient's Chart, lab work & pertinent test results  History of Anesthesia Complications (+) PONV and history of anesthetic complications  Airway Mallampati: II  TM Distance: >3 FB Neck ROM: Full    Dental  (+) Teeth Intact, Dental Advisory Given   Pulmonary asthma ,    breath sounds clear to auscultation       Cardiovascular hypertension,  Rhythm:Regular Rate:Normal     Neuro/Psych  Headaches, PSYCHIATRIC DISORDERS Anxiety Depression    GI/Hepatic negative GI ROS, Neg liver ROS,   Endo/Other  negative endocrine ROS  Renal/GU negative Renal ROS     Musculoskeletal negative musculoskeletal ROS (+)   Abdominal (+) + obese,   Peds  Hematology negative hematology ROS (+)   Anesthesia Other Findings - HLD  Reproductive/Obstetrics                            Anesthesia Physical Anesthesia Plan  ASA: III  Anesthesia Plan: General   Post-op Pain Management:    Induction: Intravenous  PONV Risk Score and Plan: 4 or greater and Ondansetron, Dexamethasone, Midazolam and Scopolamine patch - Pre-op  Airway Management Planned: Oral ETT  Additional Equipment:   Intra-op Plan:   Post-operative Plan: Extubation in OR  Informed Consent: I have reviewed the patients History and Physical, chart, labs and discussed the procedure including the risks, benefits and alternatives for the proposed anesthesia with the patient or authorized representative who has indicated his/her understanding and acceptance.     Dental advisory given  Plan Discussed with: CRNA  Anesthesia Plan Comments: (Possible post induction arterial line. )       Anesthesia Quick Evaluation

## 2021-03-17 NOTE — H&P (Signed)
Shannon [name] is an 40 y.o. female.   Chief Complaint: Back pain HPI: 40 year old female progressive worsening back pain with some numbness and tingling in her legs and feet.  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  Due to the patient's progressive clinical syndrome imaging findings and failed conservative treatment I recommended decompressive laminectomy for resection of intradural mass.  I extensively went over the risks and benefits of the operation with her as well as perioperative course expectations of outcome and alternatives of surgery she understood and agreed to proceed forward.  Past Medical History:  Diagnosis Date  . ADD (attention deficit disorder)   . Anemia   . Anxiety   . Asthma   . Back pain   . Bilateral swelling of feet   . Chest pain   . Constipation   . Depression   . Hyperlipidemia   . Hypertension    Preeclampsia post delivery  . Irregular heart beat   . Joint pain   . Multiple food allergies   . Obesity   . Palpitations in pediatric patient   . PONV (postoperative nausea and vomiting)    PONV; Also emotional upon waking up  . Shortness of breath   . Vitamin D deficiency     Past Surgical History:  Procedure Laterality Date  . DILATATION & CURRETTAGE/HYSTEROSCOPY WITH RESECTOCOPE N/A 02/03/2014   Procedure: DILATATION & CURETTAGE/HYSTEROSCOPY ;  Surgeon: Delice Lesch, MD;  Location: [facility];  Service: Gynecology;  Laterality: N/A;  . DILATION AND CURETTAGE OF UTERUS    . MYOMECTOMY N/A 02/03/2014   Procedure: MYOMECTOMY ;  Surgeon: Delice Lesch, MD;  Location: [facility];  Service: Gynecology;  Laterality: N/A;  . SALPINGECTOMY    . TONSILLECTOMY    . tonsils and adnoids removed    . TUBAL LIGATION N/A 08/19/2015   Procedure: POST PARTUM TUBAL LIGATION;  Surgeon: Everett Graff, MD;  Location: [facility];  Service: Gynecology;  Laterality: N/A;  . WISDOM TOOTH EXTRACTION       Family History  Problem Relation Age of Onset  . Hypertension Mother   . Diabetes Mother   . Mental illness Mother   . Hyperlipidemia Mother   . Stroke Mother   . Depression Mother   . Anxiety disorder Mother   . Bipolar disorder Mother   . Liver disease Mother   . Sleep apnea Mother   . Obesity Mother   . Hypertension Father   . Prostate cancer Father   . Asthma Brother   . Heart disease Maternal Grandmother   . Hypertension Maternal Grandmother   . Cancer Paternal Grandmother        breast cancer  . Seizures Brother    Social History:  reports that she has never smoked. She has never used smokeless tobacco. She reports that she does not drink alcohol and does not use drugs.  Allergies:  Allergies  Allergen Reactions  . Diclofenac Rash  . Pineapple Shortness Of Breath  . Ceftriaxone Sodium Hives  . Diflucan [Fluconazole] Hives    Raw rash on abdomen and she had sores and swelling in mouth   . Eggs Or Egg-Derived Products Nausea And Vomiting    Can eat cake made with eggs  . Bactrim [Sulfamethoxazole-Trimethoprim] Hives  . Latex Rash    Medications Prior to Admission  Medication Sig Dispense Refill  . DULoxetine (CYMBALTA) 60 MG capsule Take 1 capsule by mouth daily.   1  .  oxyCODONE-acetaminophen (PERCOCET/ROXICET) 5-325 MG tablet Take 1-2 tablets by mouth every 6 (six) hours as needed for severe pain. 20 tablet 0  . predniSONE (DELTASONE) 20 MG tablet 3 po once a day for 2 days, then 2 po once a day for 3 days, then 1 po once a day for 3 days 15 tablet 0  . VYVANSE 70 MG capsule Take 70 mg by mouth every morning.    Marland Kitchen albuterol (PROVENTIL HFA;VENTOLIN HFA) 108 (90 Base) MCG/ACT inhaler Inhale 2 puffs into the lungs every 6 (six) hours as needed for wheezing or shortness of breath. 1 Inhaler 3  . cyclobenzaprine (FLEXERIL) 10 MG tablet Take 1 tablet (10 mg total) by mouth at bedtime. As needed for back pain 20 tablet 0  . ibuprofen (ADVIL) 200 MG tablet Take 800  mg by mouth every 6 (six) hours as needed.    Marland Kitchen ibuprofen (ADVIL) 600 MG tablet Take 1 tablet (600 mg total) by mouth every 6 (six) hours as needed. (Patient not taking: No sig reported) 30 tablet 0  . ondansetron (ZOFRAN ODT) 8 MG disintegrating tablet 1/2- 1 tablet q 8 hr prn nausea, vomiting (Patient not taking: No sig reported) 20 tablet 0  . OVER THE COUNTER MEDICATION Take 1 tablet by mouth daily at 12 noon. Nervive nerve pain relief    . OVER THE COUNTER MEDICATION Take 2 tablets by mouth daily at 12 noon. Magnilife leg and back pain.      Results for orders placed or performed during the hospital encounter of 03/17/21 (from the past 48 hour(s))  Pregnancy, urine POC     Status: None   Collection Time: 03/17/21  7:44 AM  Result Value Ref Range   Preg Test, Ur NEGATIVE NEGATIVE    Comment:        THE SENSITIVITY OF THIS METHODOLOGY IS >24 mIU/mL   Glucose, capillary     Status: Abnormal   Collection Time: 03/17/21  7:53 AM  Result Value Ref Range   Glucose-Capillary 105 (H) 70 - 99 mg/dL    Comment: Glucose reference range applies only to samples taken after fasting for at least 8 hours.   No results found.  Review of Systems  Blood pressure (!) 162/115, pulse 99, temperature 98.4 F (36.9 C), temperature source Oral, resp. rate 18, last menstrual period 03/03/2021, SpO2 100 %, unknown if currently breastfeeding. Physical Exam   Assessment/Plan 40 year old presents for lumbar decompressive laminectomy L4-5 for resection of intradural mass  Elaina Hoops, MD 03/17/2021, 9:37 AM

## 2021-03-17 NOTE — Anesthesia Postprocedure Evaluation (Signed)
Anesthesia Post Note  Patient: Aylah Marineau  Procedure(s) Performed: Laminectomy - Lumbar Four-Lumbar Five for Intradural mass (N/A )     Patient location during evaluation: PACU Anesthesia Type: General Level of consciousness: awake and alert Pain management: pain level controlled Vital Signs Assessment: post-procedure vital signs reviewed and stable Respiratory status: spontaneous breathing, nonlabored ventilation, respiratory function stable and patient connected to nasal cannula oxygen Cardiovascular status: blood pressure returned to baseline and stable Postop Assessment: no apparent nausea or vomiting Anesthetic complications: no   No complications documented.  Last Vitals:  Vitals:   03/17/21 1330 03/17/21 1345  BP: 129/83 124/77  Pulse: (!) 103 (!) 102  Resp: 20 18  Temp:    SpO2: 95% 96%              Effie [place]kshire

## 2021-03-18 ENCOUNTER — Encounter (HOSPITAL_COMMUNITY): Payer: Self-pay | Admitting: Neurosurgery

## 2021-03-18 MED ORDER — PANTOPRAZOLE SODIUM 40 MG PO TBEC
40.0000 mg | DELAYED_RELEASE_TABLET | Freq: Every day | ORAL | Status: DC
  Administered 2021-03-18 – 2021-03-23 (×6): 40 mg via ORAL
  Filled 2021-03-18 (×6): qty 1

## 2021-03-18 NOTE — Progress Notes (Signed)
PHARMACIST - PHYSICIAN COMMUNICATION  DR:   Saintclair Halsted  CONCERNING: IV to Oral Route Change Policy  RECOMMENDATION: This patient is receiving Protonix by the intravenous route.  Based on criteria approved by the Pharmacy and Therapeutics Committee, the intravenous medication(s) is/are being converted to the equivalent oral dose form(s).   DESCRIPTION: These criteria include:  The patient is eating (either orally or via tube) and/or has been taking other orally administered medications for a least 24 hours  The patient has no evidence of active gastrointestinal bleeding or impaired GI absorption (gastrectomy, short bowel, patient on TNA or NPO).  If you have questions about this conversion, please contact the Pharmacy Department  []   [phone] )  [facility] []   [phone] )  [facility] [x]   [phone] )  [facility] []   [phone] )  [facility] []   [phone] )  [place], [facility] 03/18/2021 11:15 AM

## 2021-03-18 NOTE — Progress Notes (Signed)
Subjective: Patient reports doing well  Objective: Vital signs in last 24 hours: Temp:  [97.9 F (36.6 C)-98.2 F (36.8 C)] 98.1 F (36.7 C) (05/28 0722) Pulse Rate:  [78-108] 97 (05/28 0722) Resp:  [13-24] 17 (05/28 0722) BP: (123-156)/(10-107) 132/95 (05/28 0722) SpO2:  [92 %-99 %] 98 % (05/28 0722)  Intake/Output from previous day: 05/27 0701 - 05/28 0700 In: 2673.7 [P.O.:620; I.V.:1603.7; IV Piggyback:450] Out: 2770 [Urine:2520; Blood:250] Intake/Output this shift: Total I/O In: -  Out: 350 [Urine:350]  Physical Exam: Dressing CDI.  Full strength both legs.  No numbness.  No headache.  Lab Results: No results for input(s): WBC, HGB, HCT, PLT in the last 72 hours. BMET No results for input(s): NA, K, CL, CO2, GLUCOSE, BUN, CREATININE, CALCIUM in the last 72 hours.  Studies/Results: DG Lumbar Spine 2-3 Views  Result Date: 03/17/2021 CLINICAL DATA:  Intraop localization for laminectomy at L4-L5. EXAM: LUMBAR SPINE - 2-3 VIEW COMPARISON:  None. FINDINGS: Two portable cross-table lateral views of the lumbar spine obtained in the operating room. Image labeled 1 demonstrates surgical instrument in the soft tissues posterior to L4. Image labeled 2 demonstrates surgical instruments localizing posterior to L4. IMPRESSION: Surgical instruments localizing posterior to L4. Electronically Signed   By: Keith Rake M.D.   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  Plan is for her to lay flat until tomorrow, then mobilize with likely discharge home Monday.      LOS: 1 day    Peggyann Shoals, MD 03/18/2021, 9:31 AM

## 2021-03-18 NOTE — Plan of Care (Signed)

## 2021-03-19 MED ORDER — METHOCARBAMOL 500 MG PO TABS
1000.0000 mg | ORAL_TABLET | Freq: Three times a day (TID) | ORAL | Status: DC | PRN
  Administered 2021-03-19 – 2021-03-24 (×13): 1000 mg via ORAL
  Filled 2021-03-19 (×13): qty 2

## 2021-03-19 MED ORDER — HEPARIN SODIUM (PORCINE) 5000 UNIT/ML IJ SOLN
5000.0000 [IU] | Freq: Three times a day (TID) | INTRAMUSCULAR | Status: DC
  Administered 2021-03-19 – 2021-03-24 (×16): 5000 [IU] via SUBCUTANEOUS
  Filled 2021-03-19 (×16): qty 1

## 2021-03-19 MED ORDER — GABAPENTIN 300 MG PO CAPS
300.0000 mg | ORAL_CAPSULE | Freq: Three times a day (TID) | ORAL | Status: DC
  Administered 2021-03-19 – 2021-03-20 (×5): 300 mg via ORAL
  Filled 2021-03-19 (×5): qty 1

## 2021-03-19 NOTE — Progress Notes (Signed)
Assisted to sit up on side of bed b/c she wants to attempt to sit on bedside commode. Pain becomes too intense when sitting up and unable to get up to bedside commode b/c of the pain. Pain 10/10 sharp/vibrating pain is how she describes the pain. Assisted back to lying position and pain med given per orders. See EMAR..  Wants to use pure wick at this time due to pain.

## 2021-03-19 NOTE — Progress Notes (Signed)
Neurosurgery Service Progress Note  Subjective: No acute events overnight, having muscle spasms and some dysesthetic pain, no weakness or new numbness   Objective: Vitals:   03/18/21 1900 03/19/21 0133 03/19/21 0844 03/19/21 1213  BP: 107/61 117/85 (!) 144/77 102/70  Pulse:  (!) 102 (!) 113 (!) 117  Resp:  (!) 24  20  Temp: 98.5 F (36.9 C) 98.3 F (36.8 C) 99 F (37.2 C) 98.8 F (37.1 C)  TempSrc:  Oral Oral Oral  SpO2:  96% 99% 96%    Physical Exam: Strength 5/5 x4, SILTx4 Incision c/d/i  Assessment & Plan: 40 y.o. 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  -will change cyclobenzaprine to robaxin -add gabapentin 300tid for dysesthesias -add SQH -liberalize activity to as tolerated, d/c foley, start PT/OT  Judith Part  03/19/21 1:38 PM

## 2021-03-20 NOTE — Evaluation (Signed)
Occupational Therapy Evaluation Patient Details Name: Shannon [name] MRN: [id number] DOB: [birth year] Today's Date: 03/20/2021    History of Present Illness This 40 y.o. female admitted with progressive worsening back pain with numbness and tingling in legs and feet.  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  She underwent Decompressive lumbar laminectomy L4 and L5 and facetectomies for resection of tumor.  PMH Includes: HTN, depression, anxiety, asthma   Clinical Impression   Pt admitted with above. She demonstrates the below listed deficits and will benefit from continued OT to maximize safety and independence with BADLs.   Pt presents to OT with decreased activity tolerance, increased pain, decreased balance.  She currently requires set up assist - min A for UB ADLs and max - total A for LB ADL and min A +2 for functional mobility.  She lives with her spouse and young children and was fully independent PTA, including working full time.  Recommend CIR.       Follow Up Recommendations  CIR    Equipment Recommendations  3 in 1 bedside commode    Recommendations for Other Services       Precautions / Restrictions Precautions Precautions: Back;Fall Precaution Booklet Issued: Yes (comment) Precaution Comments: Verbally reviewed, provided written handout      Mobility Bed Mobility Overal bed mobility: Needs Assistance Bed Mobility: Rolling;Sidelying to Sit Rolling: Supervision Sidelying to sit: Min assist       General bed mobility comments: Cues for log roll technique and use of bed rail, light minA to execute sitting upright    Transfers Overall transfer level: Needs assistance Equipment used: Rolling walker (2 wheeled) Transfers: Sit to/from [employer] Sit to Stand: Min assist;+2 physical assistance Stand pivot transfers: Min assist;+2 physical assistance;+2 safety/equipment       General transfer comment:  MinA + 2 to rise to standing from edge of bed and toilet, cues for hand positioning, pt utilizing wide BOS    Balance Overall balance assessment: Needs assistance Sitting-balance support: Feet supported;Single extremity supported Sitting balance-Leahy Scale: Poor Sitting balance - Comments: Reliant on at least single UE support due to pain   Standing balance support: Bilateral upper extremity supported Standing balance-Leahy Scale: Poor Standing balance comment: reliant on RW                           ADL either performed or assessed with clinical judgement   ADL Overall ADL's : Needs assistance/impaired Eating/Feeding: Independent   Grooming: Wash/dry hands;Wash/dry face;Oral care;Set up;Sitting Grooming Details (indicate cue type and reason): unable to tolerate in standing due to increased pain Upper Body Bathing: Minimal assistance;Sitting   Lower Body Bathing: Maximal assistance;Sit to/from stand   Upper Body Dressing : Minimal assistance;Sitting   Lower Body Dressing: Total assistance;Sit to/from stand   Toilet Transfer: Minimal assistance;+2 for physical assistance;+2 for safety/equipment;Ambulation;Comfort height toilet;Grab bars;RW   Toileting- Clothing Manipulation and Hygiene: Moderate assistance;Sit to/from stand       Functional mobility during ADLs: Minimal assistance;+2 for physical assistance;+2 for safety/equipment;Rolling walker General ADL Comments: Pt limited by pain     Vision Baseline Vision/History: Wears glasses Wears Glasses: At all times Patient Visual Report: No change from baseline       Perception     Praxis      Pertinent Vitals/Pain Pain Assessment: 0-10 Pain Score: 8  Pain Location: back, R buttocks Pain Descriptors / Indicators: Grimacing;Guarding;Burning;Spasm Pain Intervention(s): Limited activity  within patient's tolerance;Premedicated before session;Monitored during session;Repositioned     Hand Dominance Right    Extremity/Trunk Assessment Upper Extremity Assessment Upper Extremity Assessment: Overall WFL for tasks assessed   Lower Extremity Assessment Lower Extremity Assessment: Defer to PT evaluation   Cervical / Trunk Assessment Cervical / Trunk Assessment: Other exceptions Cervical / Trunk Exceptions: s/p lumbar lami   Communication Communication Communication: No difficulties   Cognition Arousal/Alertness: Awake/alert Behavior During Therapy: WFL for tasks assessed/performed Overall Cognitive Status: Within Functional Limits for tasks assessed                                     General Comments  HR to 149 with activity    Exercises     Shoulder Instructions      Home Living Family/patient expects to be discharged to:: Private residence Living Arrangements: Spouse/significant other;Children Available Help at Discharge: Family;Available 24 hours/day Type of Home: House Home Access: Stairs to enter     Home Layout: Two level;Able to live on main level with bedroom/bathroom;1/2 bath on main level Alternate Level Stairs-Number of Steps: full flight       Bathroom Toilet: Standard     Home Equipment: None   Additional Comments: Pt's spouse is moving bedroom furniture downstairs so pt can stay on main level.  Has 1/2 bath on first floor.  She has 5 and 59 y.o. children, and has a 81 y.o. 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 Problem List: Decreased activity tolerance;Impaired balance (sitting and/or standing);Decreased safety awareness;Decreased knowledge of use of DME or AE;Decreased knowledge of precautions;Obesity;Pain      OT Treatment/Interventions: Self-care/ADL training;Therapeutic activities;Patient/family education;Balance training;DME and/or AE instruction    OT Goals(Current goals can be  found in the care plan section) Acute Rehab OT Goals Patient Stated Goal: to have less pain, and to get back to normal OT Goal Formulation: With patient Time For Goal Achievement: 04/03/21 Potential to Achieve Goals: Good ADL Goals Pt Will Perform Grooming: with min guard assist;standing Pt Will Perform Upper Body Bathing: with set-up;sitting Pt Will Perform Lower Body Bathing: with min guard assist;sit to/from stand;with adaptive equipment Pt Will Perform Upper Body Dressing: with set-up;sitting Pt Will Perform Lower Body Dressing: with min guard assist;sit to/from stand;with adaptive equipment Pt Will Transfer to Toilet: with min guard assist;ambulating;regular height toilet;bedside commode;grab bars Pt Will Perform Toileting - Clothing Manipulation and hygiene: with min guard assist;sit to/from stand  OT Frequency: Min 2X/week   Barriers to D/C:            Co-evaluation PT/OT/SLP Co-Evaluation/Treatment: Yes Reason for Co-Treatment: For patient/therapist safety;To address functional/ADL transfers          AM-PAC OT "6 Clicks" Daily Activity     Outcome Measure Help from another person eating meals?: None Help from another person taking care of personal grooming?: A Little Help from another person toileting, which includes using toliet, bedpan, or urinal?: A Lot Help from another person bathing (including washing, rinsing, drying)?: A Lot Help from another person to put on and taking off regular upper body clothing?: A Little Help from another person to put on and taking off regular lower body clothing?: Total 6 Click Score: 15  End of Session Equipment Utilized During Treatment: Rolling walker;Gait belt Nurse Communication: Mobility status  Activity Tolerance: Patient limited by pain Patient left: in chair;with call bell/phone within reach;with chair alarm set  OT Visit Diagnosis: Unsteadiness on feet (R26.81);Pain Pain - part of body:  (back)                Time:  8350-7573 OT Time Calculation (min): 34 min Charges:  OT General Charges $OT Visit: 1 Visit OT Evaluation $OT Eval Moderate Complexity: 1 Mod  Nilsa Nutting., OTR/L Acute Rehabilitation Services Pager [phone] Office [phone]   Lucille Passy M 03/20/2021, 1:21 PM

## 2021-03-20 NOTE — Consult Note (Signed)
Physical Medicine and Rehabilitation Consult  Reason for Consult: Functional deficits Referring Physician: Dr. Saintclair Halsted   HPI: Donnalee Rosen is a 40 y.o. 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 She was admitted on 03/17/21 for decompressive L4 and L5 laminectomy with tumor excision by Dr. Saintclair Halsted. Post op with tachycardia, dysesthesias and LE muscle spasms. Therapy evaluations completed revealing BLE weakness with knee. fatiugeinstability and balance deficits affecting ADLs and mobility. CIR was recommended due to functional decline.    Review of Systems  Constitutional: Negative for chills and fever.  HENT: Negative for ear pain and tinnitus.   Eyes: Negative for pain.  Respiratory: Negative for cough and hemoptysis.   Cardiovascular: Negative for chest pain and palpitations.  Gastrointestinal: Negative for nausea and vomiting.  Musculoskeletal: Positive for back pain and myalgias.  Skin: Negative for rash.  Neurological: Positive for sensory change, focal weakness and weakness. Negative for dizziness.  Psychiatric/Behavioral: Negative for suicidal ideas.      Past Medical History:  Diagnosis Date  . ADD (attention deficit disorder)   . Anemia   . Anxiety   . Asthma   . Back pain   . Bilateral swelling of feet   . Chest pain   . Constipation   . Depression   . Hyperlipidemia   . Hypertension    Preeclampsia post delivery  . Irregular heart beat   . Joint pain   . Multiple food allergies   . Obesity   . Palpitations in pediatric patient   . PONV (postoperative nausea and vomiting)    PONV; Also emotional upon waking up  . Shortness of breath   . Vitamin D deficiency     Past Surgical History:  Procedure Laterality Date  . DILATATION & CURRETTAGE/HYSTEROSCOPY WITH RESECTOCOPE N/A 02/03/2014   Procedure: DILATATION &  CURETTAGE/HYSTEROSCOPY ;  Surgeon: Delice Lesch, MD;  Location: [facility];  Service: Gynecology;  Laterality: N/A;  . DILATION AND CURETTAGE OF UTERUS    . LAMINECTOMY N/A 03/17/2021   Procedure: Laminectomy - Lumbar Four-Lumbar Five for Intradural mass;  Surgeon: Kary Kos, MD;  Location: [facility];  Service: Neurosurgery;  Laterality: N/A;  3C  . MYOMECTOMY N/A 02/03/2014   Procedure: MYOMECTOMY ;  Surgeon: Delice Lesch, MD;  Location: [facility];  Service: Gynecology;  Laterality: N/A;  . SALPINGECTOMY    . TONSILLECTOMY    . tonsils and adnoids removed    . TUBAL LIGATION N/A 08/19/2015   Procedure: POST PARTUM TUBAL LIGATION;  Surgeon: Everett Graff, MD;  Location: [facility];  Service: Gynecology;  Laterality: N/A;  . WISDOM TOOTH EXTRACTION      Family History  Problem Relation Age of Onset  . Hypertension Mother   . Diabetes Mother   . Mental illness Mother   . Hyperlipidemia Mother   . Stroke Mother   . Depression Mother   . Anxiety disorder Mother   . Bipolar disorder Mother   . Liver disease Mother   . Sleep apnea Mother   . Obesity Mother   . Hypertension Father   . Prostate cancer Father   . Asthma Brother   . Heart disease Maternal Grandmother   . Hypertension Maternal Grandmother   . Cancer Paternal Grandmother        breast cancer  . Seizures Brother     Social History:  reports  that she has never smoked. She has never used smokeless tobacco. She reports that she does not drink alcohol and does not use drugs.    Allergies  Allergen Reactions  . Diclofenac Rash  . Pineapple Shortness Of Breath  . Ceftriaxone Sodium Hives  . Diflucan [Fluconazole] Hives    Raw rash on abdomen and she had sores and swelling in mouth   . Eggs Or Egg-Derived Products Nausea And Vomiting    Can eat cake made with eggs  . Bactrim [Sulfamethoxazole-Trimethoprim] Hives  . Latex Rash   Medications Prior to Admission  Medication Sig Dispense Refill  . DULoxetine (CYMBALTA) 60 MG  capsule Take 1 capsule by mouth daily.   1  . oxyCODONE-acetaminophen (PERCOCET/ROXICET) 5-325 MG tablet Take 1-2 tablets by mouth every 6 (six) hours as needed for severe pain. 20 tablet 0  . predniSONE (DELTASONE) 20 MG tablet 3 po once a day for 2 days, then 2 po once a day for 3 days, then 1 po once a day for 3 days 15 tablet 0  . VYVANSE 70 MG capsule Take 70 mg by mouth every morning.    Marland Kitchen albuterol (PROVENTIL HFA;VENTOLIN HFA) 108 (90 Base) MCG/ACT inhaler Inhale 2 puffs into the lungs every 6 (six) hours as needed for wheezing or shortness of breath. 1 Inhaler 3  . cyclobenzaprine (FLEXERIL) 10 MG tablet Take 1 tablet (10 mg total) by mouth at bedtime. As needed for back pain 20 tablet 0  . ibuprofen (ADVIL) 200 MG tablet Take 800 mg by mouth every 6 (six) hours as needed.    Marland Kitchen ibuprofen (ADVIL) 600 MG tablet Take 1 tablet (600 mg total) by mouth every 6 (six) hours as needed. (Patient not taking: No sig reported) 30 tablet 0  . ondansetron (ZOFRAN ODT) 8 MG disintegrating tablet 1/2- 1 tablet q 8 hr prn nausea, vomiting (Patient not taking: No sig reported) 20 tablet 0  . OVER THE COUNTER MEDICATION Take 1 tablet by mouth daily at 12 noon. Nervive nerve pain relief    . OVER THE COUNTER MEDICATION Take 2 tablets by mouth daily at 12 noon. Magnilife leg and back pain.      Home: Home Living Family/patient expects to be discharged to:: Private residence Living Arrangements: Spouse/significant other,Children Available Help at Discharge: Family,Available 24 hours/day Type of Home: House Home Access: Stairs to enter [employer] of Steps: 3 Home Layout: Two level,Able to live on main level with bedroom/bathroom,1/2 bath on main level Alternate Level Stairs-Number of Steps: full flight Bathroom Toilet: Standard Home Equipment: None Additional Comments: Pt's spouse is moving bedroom furniture downstairs so pt can stay on main level.  Has 1/2 bath on first floor.  She has 5 and 76  y.o. children, and has a 45 y.o. daughter who will stay after discharge to assist her  Functional History: Prior Function Level of Independence: Independent Comments: works as a Astronomer Status:  Mobility: Bed Mobility Overal bed mobility: Needs Assistance Bed Mobility: Rolling,Sidelying to Sit Rolling: Supervision Sidelying to sit: Min assist General bed mobility comments: Cues for log roll technique and use of bed rail, light minA to execute sitting upright Transfers Overall transfer level: Needs assistance Equipment used: Rolling walker (2 wheeled) Transfers: Sit to/from [employer] to Stand: [employer] physical assistance Stand pivot transfers: Min assist,+2 physical assistance,+2 safety/equipment General transfer comment: MinA + 2 to rise to standing from edge of bed and toilet, cues for hand positioning,  pt utilizing wide BOS Ambulation/Gait Ambulation/Gait assistance: Min assist,Mod assist,+2 physical assistance Gait Distance (Feet): 25 Feet Assistive device: Rolling walker (2 wheeled) Gait Pattern/deviations: Step-through pattern,Decreased stride length,Wide base of support General Gait Details: Pt requiring minA + 2 initially, but up to modA + 2 provided with fatigue and bilateral knee instability in order to turn and assist onto toilet. Cues provided for walker use, sequencing, breathing. Gait velocity: decreased Gait velocity interpretation: <1.31 ft/sec, indicative of household ambulator    ADL: ADL Overall ADL's : Needs assistance/impaired Eating/Feeding: Independent Grooming: Wash/dry hands,Wash/dry face,Oral care,Set up,Sitting Grooming Details (indicate cue type and reason): unable to tolerate in standing due to increased pain Upper Body Bathing: Minimal assistance,Sitting Lower Body Bathing: Maximal assistance,Sit to/from stand Upper Body Dressing : Minimal assistance,Sitting Lower Body Dressing: Total  assistance,Sit to/from stand Toilet Transfer: Minimal assistance,+2 for physical assistance,+2 for safety/equipment,Ambulation,Comfort height toilet,Grab bars,RW Toileting- Clothing Manipulation and Hygiene: Moderate assistance,Sit to/from stand Functional mobility during ADLs: Minimal assistance,+2 for physical assistance,+2 for safety/equipment,Rolling walker General ADL Comments: Pt limited by pain  Cognition: Cognition Overall Cognitive Status: Within Functional Limits for tasks assessed Orientation Level: Oriented X4 Cognition Arousal/Alertness: Awake/alert Behavior During Therapy: WFL for tasks assessed/performed Overall Cognitive Status: Within Functional Limits for tasks assessed  Blood pressure 122/69, pulse 100, temperature 98.4 F (36.9 C), temperature source Oral, resp. rate 20, last menstrual period 03/03/2021, SpO2 97 %, unknown if currently breastfeeding. Physical Exam Constitutional:      General: She is not in acute distress.    Appearance: Normal appearance.  HENT:     Head: Normocephalic and atraumatic.     Right Ear: External ear normal.     Left Ear: External ear normal.     Mouth/Throat:     Mouth: Mucous membranes are moist.  Eyes:     Extraocular Movements: Extraocular movements intact.     Pupils: Pupils are equal, round, and reactive to light.  Cardiovascular:     Rate and Rhythm: Tachycardia present.  Pulmonary:     Effort: Pulmonary effort is normal.  Abdominal:     Palpations: Abdomen is soft.  Musculoskeletal:        General: No swelling.     Cervical back: Normal range of motion.     Comments: Low back and right hip tender with ROM and simple palpation.   Skin:    General: Skin is warm.  Neurological:     Mental Status: She is alert and oriented to person, place, and time.     Cranial Nerves: No cranial nerve deficit.     Sensory: No sensory deficit.     Comments: Pt with 1+ DTR's. No focal motor weakness other than with right hip  flexion,extension which was limited due to pain. Other than that all lumbar innervated muscles with normal strength.   Psychiatric:        Mood and Affect: Mood normal.     No results found for this or any previous visit (from the past 24 hour(s)). No results found.   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 Does the need for close, 24 hr/day medical supervision in concert with the patient's rehab needs make it unreasonable for this patient to be served in a less intensive setting? Potentially 2. Co-Morbidities requiring supervision/potential complications: pain mgt, obesity, constipation, htn, tachycardia 3. Due to bladder management, bowel management, safety, skin/wound care, disease management, medication administration, pain management and patient  education, does the patient require 24 hr/day rehab nursing? Yes 4. Does the patient require coordinated care of a physician, rehab nurse, therapy disciplines of PT, OT to address physical and functional deficits in the context of the above medical diagnosis(es)? Yes and Potentially Addressing deficits in the following areas: balance, endurance, locomotion, strength, transferring, bowel/bladder control, bathing, dressing, feeding, grooming, toileting and psychosocial support 5. Can the patient actively participate in an intensive therapy program of at least 3 hrs of therapy per day at least 5 days per week? Yes 6. The potential for patient to make measurable gains while on inpatient rehab is excellent 7. Anticipated functional outcomes upon discharge from inpatient rehab are modified independent  with PT, modified independent with OT, n/a with SLP. 8. Estimated rehab length of stay to reach the above functional goals is: 6-9 days 9. Anticipated discharge destination: Home 10. Overall Rehab/Functional Prognosis:  excellent  RECOMMENDATIONS: This patient's condition is appropriate for continued rehabilitative care in the following setting: CIR Patient has agreed to participate in recommended program. Yes Note that insurance prior authorization may be required for reimbursement for recommended care.  Comment: Pain still a big barrier for her. Gabapentin started today which may help RLE sx. Rehab Admissions Coordinator to follow up.  Thanks,  Meredith Staggers, MD, Mellody Drown  I have personally performed a face to face diagnostic evaluation of this patient. Additionally, I have examined pertinent labs and radiographic images. I have reviewed and concur with the physician assistant's documentation above.    Bary Leriche, PA-C 03/21/2021

## 2021-03-20 NOTE — Progress Notes (Signed)
Inpatient Rehab Admissions Coordinator Note:   Per therapy recommendations, pt was screened for CIR candidacy by Rayla Pember, MS CCC-SLP. At this time, Pt. Appears to have functional decline and is a good candidate for CIR. Will place order for rehab consult per protocol.  Please contact me with questions.   Lorne Winkels, MS, CCC-SLP Rehab Admissions Coordinator  [phone] (celll) [phone] (office)  

## 2021-03-20 NOTE — Evaluation (Signed)
Physical Therapy Evaluation Patient Details Name: Shannon [name] MRN: [id number] DOB: Jun 28, 1981 Today's Date: 03/20/2021   History of Present Illness  This 40 y.o. female admitted with progressive worsening back pain with numbness and tingling in legs and feet.  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  She underwent Decompressive lumbar laminectomy L4 and L5 and facetectomies for resection of tumor.  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 Pt reporting up to 8/10 back and R buttocks pain/spasms despite premedication, however, she is still motivated to participate. Pt presents with decreased functional mobility secondary to pain, weakness, gait abnormalities, and balance deficits. Pt requiring two person min-mod assist for mobility; ambulating to bathroom and back using a walker. HR up to 149 bpm. Suspect good progress given age, motivation and PLOF. Recommend CIR to address deficits and maximize functional independence.     Follow Up Recommendations CIR    Equipment Recommendations  Rolling walker with 5" wheels;3in1 (PT)    Recommendations for Other Services       Precautions / Restrictions Precautions Precautions: Back;Fall Precaution Booklet Issued: Yes (comment) Precaution Comments: Verbally reviewed, provided written handout Restrictions Weight Bearing Restrictions: No      Mobility  Bed Mobility Overal bed mobility: Needs Assistance Bed Mobility: Rolling;Sidelying to Sit Rolling: Supervision Sidelying to sit: Min assist       General bed mobility comments: Cues for log roll technique and use of bed rail, light minA to execute sitting upright    Transfers Overall transfer level: Needs assistance Equipment used: Rolling walker (2 wheeled) Transfers: Sit to/from Stand Sit to Stand: Min assist;+2 physical assistance          General transfer comment: MinA + 2 to rise to standing from edge of bed and toilet, cues for hand positioning, pt utilizing wide BOS  Ambulation/Gait Ambulation/Gait assistance: Min assist;Mod assist;+2 physical assistance Gait Distance (Feet): 25 Feet Assistive device: Rolling walker (2 wheeled) Gait Pattern/deviations: Step-through pattern;Decreased stride length;Wide base of support Gait velocity: decreased Gait velocity interpretation: <1.31 ft/sec, indicative of household ambulator General Gait Details: Pt requiring minA + 2 initially, but up to modA + 2 provided with fatigue and bilateral knee instability in order to turn and assist onto toilet. Cues provided for walker use, sequencing, breathing.  Stairs            Wheelchair Mobility    Modified Rankin (Stroke Patients Only)       Balance Overall balance assessment: Needs assistance Sitting-balance support: Feet supported;Single extremity supported Sitting balance-Leahy Scale: Poor Sitting balance - Comments: Reliant on at least single UE support due to pain   Standing balance support: Bilateral upper extremity supported Standing balance-Leahy Scale: Poor Standing balance comment: reliant on RW                             Pertinent Vitals/Pain Pain Assessment: 0-10 Pain Score: 8  Pain Location: back, R buttocks Pain Descriptors / Indicators: Grimacing;Guarding;Burning;Spasm Pain Intervention(s): Limited activity within patient's tolerance;Monitored during session;Premedicated before session;Repositioned    Home Living Family/patient expects to be discharged to:: Private residence Living Arrangements: Spouse/significant other;Children Available Help at Discharge: Family;Available 24 hours/day Type of Home: House Home Access: Stairs to enter   [employer] of Steps: 3 Home Layout: Two level;Able to live on main level with bedroom/bathroom;1/2 bath on main level  Home Equipment:  None Additional Comments: Pt's spouse is moving bedroom furniture downstairs so pt can stay on main level.  Has 1/2 bath on first floor.  She has 5 and 47 y.o. children, and has a 22 y.o. 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        Extremity/Trunk Assessment   Upper Extremity Assessment Upper Extremity Assessment: Defer to OT evaluation    Lower Extremity Assessment Lower Extremity Assessment: RLE deficits/detail;LLE deficits/detail RLE Deficits / Details: Grossly 4/5 LLE Deficits / Details: Grossly 4/5    Cervical / Trunk Assessment Cervical / Trunk Assessment: Other exceptions Cervical / Trunk Exceptions: s/p lumbar lami  Communication   Communication: No difficulties  Cognition Arousal/Alertness: Awake/alert Behavior During Therapy: WFL for tasks assessed/performed Overall Cognitive Status: Within Functional Limits for tasks assessed                                        General Comments      Exercises     Assessment/Plan    PT Assessment Patient needs continued PT services  PT Problem List Decreased strength;Decreased activity tolerance;Decreased balance;Decreased mobility;Pain       PT Treatment Interventions DME instruction;Gait training;Stair training;Functional mobility training;Therapeutic activities;Therapeutic exercise;Balance training;Patient/family education    PT Goals (Current goals can be found in the Care Plan section)  Acute Rehab PT Goals Patient Stated Goal: less pain, be able to walk to toilet PT Goal Formulation: With patient Time For Goal Achievement: 04/03/21 Potential to Achieve Goals: Good    Frequency Min 5X/week   Barriers to discharge        Co-evaluation               AM-PAC PT "6 Clicks" Mobility  Outcome Measure Help needed turning from your back  to your side while in a flat bed without using bedrails?: A Little Help needed moving from lying on your back to sitting on the side of a flat bed without using bedrails?: A Little Help needed moving to and from a bed to a chair (including a wheelchair)?: A Lot Help needed standing up from a chair using your arms (e.g., wheelchair or bedside chair)?: A Little Help needed to walk in hospital room?: A Lot Help needed climbing 3-5 steps with a railing? : Total 6 Click Score: 14    End of Session Equipment Utilized During Treatment: Gait belt Activity Tolerance: Patient tolerated treatment well Patient left: in chair;with call bell/phone within reach;with chair alarm set Nurse Communication: Mobility status PT Visit Diagnosis: Unsteadiness on feet (R26.81);Difficulty in walking, not elsewhere classified (R26.2);Pain Pain - part of body:  (back)    Time: 0092-3300 PT Time Calculation (min) (ACUTE ONLY): 17 min   Charges:   PT Evaluation $PT Eval Moderate Complexity: 1 Mod          Wyona Almas, PT, DPT Acute Rehabilitation Services Pager [phone] Office [place] 03/20/2021, 10:20 AM

## 2021-03-21 DIAGNOSIS — M5416 Radiculopathy, lumbar region: Secondary | ICD-10-CM

## 2021-03-21 DIAGNOSIS — C729 Malignant neoplasm of central nervous system, unspecified: Secondary | ICD-10-CM

## 2021-03-21 MED ORDER — METHYLPREDNISOLONE 4 MG PO TBPK
8.0000 mg | ORAL_TABLET | Freq: Every evening | ORAL | Status: AC
  Administered 2021-03-21: 8 mg via ORAL

## 2021-03-21 MED ORDER — METHYLPREDNISOLONE 4 MG PO TBPK
4.0000 mg | ORAL_TABLET | ORAL | Status: AC
  Administered 2021-03-21: 4 mg via ORAL

## 2021-03-21 MED ORDER — METHYLPREDNISOLONE 4 MG PO TBPK
8.0000 mg | ORAL_TABLET | Freq: Every evening | ORAL | Status: AC
  Administered 2021-03-22: 8 mg via ORAL

## 2021-03-21 MED ORDER — GABAPENTIN 600 MG PO TABS
300.0000 mg | ORAL_TABLET | Freq: Three times a day (TID) | ORAL | Status: DC
  Administered 2021-03-21 – 2021-03-24 (×10): 300 mg via ORAL
  Filled 2021-03-21 (×10): qty 1

## 2021-03-21 MED ORDER — METHYLPREDNISOLONE 4 MG PO TBPK
4.0000 mg | ORAL_TABLET | Freq: Four times a day (QID) | ORAL | Status: DC
  Administered 2021-03-23 – 2021-03-24 (×6): 4 mg via ORAL

## 2021-03-21 MED ORDER — METHYLPREDNISOLONE 4 MG PO TBPK
8.0000 mg | ORAL_TABLET | Freq: Every morning | ORAL | Status: AC
  Administered 2021-03-21: 8 mg via ORAL
  Filled 2021-03-21: qty 21

## 2021-03-21 MED ORDER — METHYLPREDNISOLONE 4 MG PO TBPK
4.0000 mg | ORAL_TABLET | Freq: Three times a day (TID) | ORAL | Status: AC
  Administered 2021-03-22 (×3): 4 mg via ORAL

## 2021-03-21 NOTE — Progress Notes (Signed)
Physical Therapy Treatment Patient Details Name: Shannon [name] MRN: [id number] DOB: 09/13/1981 Today's Date: 03/21/2021    History of Present Illness This 40 y.o. female admitted with progressive worsening back pain with numbness and tingling in legs and feet.  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  She underwent Decompressive lumbar laminectomy L4 and L5 and facetectomies for resection of tumor.  PMH Includes: HTN, depression, anxiety, asthma    PT Comments    Pt pleasant and remains motivated to participate. Session focused on exercises for BLE strengthening, stretching and functional mobility. Pt requiring min assist to stand up to a walker x 2. Ultimately, unable to ambulate today due to intense right lower back/LE muscle spasms, causing her to request to sit down. Positioned into left side lying post session and applied ice. Suspect continued progress based on age, PLOF and motivation. Continue to recommend comprehensive inpatient rehab (CIR) for post-acute therapy needs.    Follow Up Recommendations  CIR     Equipment Recommendations  Rolling walker with 5" wheels;3in1 (PT)    Recommendations for Other Services       Precautions / Restrictions Precautions Precautions: Back;Fall Precaution Booklet Issued: Yes (comment) Precaution Comments: Verbally reviewed, provided written handout Restrictions Weight Bearing Restrictions: No    Mobility  Bed Mobility Overal bed mobility: Needs Assistance Bed Mobility: Rolling;Sidelying to Sit;Sit to Sidelying Rolling: Supervision Sidelying to sit: Min guard     Sit to sidelying: Min assist General bed mobility comments: Good log roll technique, use of bed rail, sitting upright without physical assist. MinA for LE negotiation back into bed    Transfers Overall transfer level: Needs assistance Equipment used: Rolling walker (2 wheeled) Transfers: Sit to/from Stand Sit to Stand: Min  assist         General transfer comment: MinA to rise to stand x 2 from edge of bed, pt utilizing wide BOS, cues for hand placement  Ambulation/Gait             General Gait Details: Unable due to pain/spasms   Stairs             Wheelchair Mobility    Modified Rankin (Stroke Patients Only)       Balance Overall balance assessment: Needs assistance Sitting-balance support: Feet supported;Single extremity supported Sitting balance-Leahy Scale: Poor Sitting balance - Comments: Reliant on at least single UE support due to pain   Standing balance support: Bilateral upper extremity supported Standing balance-Leahy Scale: Poor Standing balance comment: reliant on RW                            Cognition Arousal/Alertness: Awake/alert Behavior During Therapy: WFL for tasks assessed/performed Overall Cognitive Status: Within Functional Limits for tasks assessed                                        Exercises General Exercises - Lower Extremity Ankle Circles/Pumps: Both;10 reps;Seated Long Arc Quad: Both;10 reps;Seated Other Exercises Other Exercises: Supine: right manual calf stretch x 1 minute, R hamstring stretch wtih MET    General Comments        Pertinent Vitals/Pain Pain Assessment: Faces Faces Pain Scale: Hurts worst Pain Location: back, R buttocks Pain Descriptors / Indicators: Grimacing;Guarding;Burning;Spasm Pain Intervention(s): Limited activity within patient's tolerance;Monitored during session;Premedicated before session;Repositioned;Ice applied    Home  Living                      Prior Function            PT Goals (current goals can now be found in the care plan section) Acute Rehab PT Goals Patient Stated Goal: less pain, be able to walk to toilet PT Goal Formulation: With patient Time For Goal Achievement: 04/03/21 Potential to Achieve Goals: Good Progress towards PT goals: Progressing toward  goals    Frequency    Min 5X/week      PT Plan Current plan remains appropriate    Co-evaluation              AM-PAC PT "6 Clicks" Mobility   Outcome Measure  Help needed turning from your back to your side while in a flat bed without using bedrails?: A Little Help needed moving from lying on your back to sitting on the side of a flat bed without using bedrails?: A Little Help needed moving to and from a bed to a chair (including a wheelchair)?: A Lot Help needed standing up from a chair using your arms (e.g., wheelchair or bedside chair)?: A Little Help needed to walk in hospital room?: A Lot Help needed climbing 3-5 steps with a railing? : Total 6 Click Score: 14    End of Session Equipment Utilized During Treatment: Gait belt Activity Tolerance: Patient limited by pain Patient left: with call bell/phone within reach;in bed Nurse Communication: Mobility status PT Visit Diagnosis: Unsteadiness on feet (R26.81);Difficulty in walking, not elsewhere classified (R26.2);Pain Pain - part of body:  (back)     Time: 5038-8828 PT Time Calculation (min) (ACUTE ONLY): 29 min  Charges:  $Therapeutic Exercise: 8-22 mins                     Wyona Almas, PT, DPT Acute Rehabilitation Services Pager [phone] Office [phone]    Deno Etienne 03/21/2021, 12:32 PM

## 2021-03-21 NOTE — Progress Notes (Signed)
Inpatient Rehabilitation Admissions Coordinator  I met with patient at bedside for rehab assessment. We discussed briefly a possible inpt rehab admit . I will begin insurance approval with [facility] for a possible admit.  Danne Baxter, RN, MSN Rehab Admissions Coordinator [phone] 03/21/2021 11:56 AM

## 2021-03-21 NOTE — Progress Notes (Signed)
Neurosurgery Service Progress Note  Subjective: No acute events overnight  Objective: Vitals:   03/20/21 1200 03/20/21 1532 03/20/21 2022 03/20/21 2333  BP:  109/80 127/83 118/74  Pulse:  (!) 108 100 98  Resp: 14  20 20   Temp:  98.4 F (36.9 C) 97.9 F (36.6 C) 98.4 F (36.9 C)  TempSrc:  Oral Oral Oral  SpO2:  96% 98% 100%    Physical Exam: Strength 5/5 x4, SILTx4 Incision c/d/i  Assessment & Plan: 40 y.o. 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  -robaxin prn muscle spasms -gabapentin 300tid for dysesthesias -SQH -PT/OT rec'd CIR, PM&R consulted  Judith Part  03/21/21 12:50 AM

## 2021-03-21 NOTE — Progress Notes (Signed)
Subjective: Patient reports Back pain right L5-S1 radicular pain  Objective: Vital signs in last 24 hours: Temp:  [97.9 F (36.6 C)-99.1 F (37.3 C)] 99.1 F (37.3 C) (05/31 0755) Pulse Rate:  [98-108] 100 (05/31 0755) Resp:  [14-20] 19 (05/31 0755) BP: (109-127)/(57-83) 113/77 (05/31 0755) SpO2:  [95 %-100 %] 95 % (05/31 0755)  Intake/Output from previous day: No intake/output data recorded. Intake/Output this shift: No intake/output data recorded.  Strength 5 out of 5 lower extremities bilaterally incision clean dry and intact no swelling no drainage  Lab Results: No results for input(s): WBC, HGB, HCT, PLT in the last 72 hours. BMET No results for input(s): NA, K, CL, CO2, GLUCOSE, BUN, CREATININE, CALCIUM in the last 72 hours.  Studies/Results: No results found.  Assessment/Plan: Postop day 4 decompressive laminectomy for resection of intradural tumor probable ependymoma awaiting final pathology.  Continue to mobilize with physical occupational therapy agree with CIR secondary to increased back pain and neuropathic pain but no focal deficit.  LOS: 4 days     Elaina Hoops 03/21/2021, 9:05 AM

## 2021-03-22 NOTE — PMR Pre-admission (Addendum)
PMR Admission Coordinator Pre-Admission Assessment  Patient: Shannon [name] is an 40 y.o., female MRN: [id number] DOB: Oct 02, 1981 Height:   Weight:               Insurance Information HMO:     PPO: yes     PCP:      IPA:      80/20:      OTHER:  PRIMARY: [place] [employer] [place]      Policy#: [id number]      Subscriber: pt CM Name:     Phone#: faxed approval      Fax#: [phone] Pre-Cert#: [id number] approved until 6/10      Employer:  Benefits:  Phone #: [phone]     Name: 5/31 Eff. Date: 10/22/2020     Deduct: $1250      Out of Pocket Max: $4890      Life Max: none  CIR: 80%      SNF: 80% 100 days per year Outpatient: $10 to $52 per visit     Co-Pay: visits per medical neccesity Home Health: 80%      Co-Pay: visits per medical neccesity DME: 80%     Co-Pay: 20% Providers: in network  SECONDARY: none  Financial Counselor:       Phone#:   The Engineer, petroleum" for patients in Inpatient Rehabilitation Facilities with attached "Privacy Act [place] Records" was provided and verbally reviewed with: N/A  Emergency Contact Information Contact Information     Name Relation Home Work Mobile   [place] Spouse [phone]  [phone]   Nickell,Carolyn Relative   [phone]      Current Medical History  Patient Admitting Diagnosis: right lumbar radiculitis L4, 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 She was admitted on 03/17/21 for decompressive L4 and L5 laminectomy with tumor excision by Dr. Saintclair Halsted. Post op with tachycardia, dysesthesias and LE muscle spasms. Therapy evaluations completed revealing BLE weakness with knee. Fatigue, instability and balance deficits affecting ADLs and mobility .  Past Medical History  Past Medical  History:  Diagnosis Date   ADD (attention deficit disorder)    Anemia    Anxiety    Asthma    Back pain    Bilateral swelling of feet    Chest pain    Constipation    Depression    Hyperlipidemia    Hypertension    Preeclampsia post delivery   Irregular heart beat    Joint pain    Multiple food allergies    Obesity    Palpitations in pediatric patient    PONV (postoperative nausea and vomiting)    PONV; Also emotional upon waking up   Shortness of breath    Vitamin D deficiency    Family History  family history includes Anxiety disorder in her mother; Asthma in her brother; Bipolar disorder in her mother; Cancer in her paternal grandmother; Depression in her mother; Diabetes in her mother; Heart disease in her maternal grandmother; Hyperlipidemia in her mother; Hypertension in her father, maternal grandmother, and mother; Liver disease in her mother; Mental illness in her mother; Obesity in her mother; Prostate cancer in her father; Seizures in her brother; Sleep apnea in her mother; Stroke in her mother.  Prior Rehab/Hospitalizations:  Has the patient had prior rehab or hospitalizations prior to admission?  Yes  Has the patient had major surgery during 100 days prior to admission? Yes  Current Medications   Current Facility-Administered Medications:    0.9 %  sodium chloride infusion, 250 mL, Intravenous, Continuous, Kary Kos, MD, Stopped at 03/18/21 [phone]   acetaminophen (TYLENOL) tablet 650 mg, 650 mg, Oral, Q4H PRN, 650 mg at 03/18/21 1932 **OR** acetaminophen (TYLENOL) suppository 650 mg, 650 mg, Rectal, Q4H PRN, Kary Kos, MD   alum & mag hydroxide-simeth (MAALOX/MYLANTA) 200-200-20 MG/5ML suspension 30 mL, 30 mL, Oral, Q6H PRN, Kary Kos, MD   cyclobenzaprine (FLEXERIL) tablet 10 mg, 10 mg, Oral, QHS, Kary Kos, MD, 10 mg at 03/23/21 2124   DULoxetine (CYMBALTA) DR capsule 60 mg, 60 mg, Oral, Daily, Kary Kos, MD, 60 mg at 03/24/21 3762   gabapentin (NEURONTIN)  tablet 300 mg, 300 mg, Oral, TID, Kary Kos, MD, 300 mg at 03/24/21 8315   heparin injection 5,000 Units, 5,000 Units, Subcutaneous, Q8H, Ostergard, Joyice Faster, MD, 5,000 Units at 03/24/21 0524   hydrALAZINE (APRESOLINE) injection 10-20 mg, 10-20 mg, Intravenous, Q4H PRN, Meyran, Ocie Cornfield, NP, 10 mg at 03/23/21 1023   HYDROmorphone (DILAUDID) injection 0.5 mg, 0.5 mg, Intravenous, Q2H PRN, Kary Kos, MD, 0.5 mg at 03/22/21 2336   ibuprofen (ADVIL) tablet 800 mg, 800 mg, Oral, Q8H PRN, Kary Kos, MD, 800 mg at 03/23/21 1707   lisdexamfetamine (VYVANSE) capsule 70 mg, 70 mg, Oral, q morning, Kary Kos, MD, 70 mg at 03/24/21 1012   menthol-cetylpyridinium (CEPACOL) lozenge 3 mg, 1 lozenge, Oral, PRN **OR** phenol (CHLORASEPTIC) mouth spray 1 spray, 1 spray, Mouth/Throat, PRN, Kary Kos, MD   methocarbamol (ROBAXIN) tablet 1,000 mg, 1,000 mg, Oral, Q8H PRN, Judith Part, MD, 1,000 mg at 03/23/21 2308   methylPREDNISolone (MEDROL DOSEPAK) tablet 4 mg, 4 mg, Oral, 4X daily taper, Kary Kos, MD, 4 mg at 03/24/21 0924   ondansetron (ZOFRAN) tablet 4 mg, 4 mg, Oral, Q6H PRN, 4 mg at 03/21/21 0433 **OR** ondansetron (ZOFRAN) injection 4 mg, 4 mg, Intravenous, Q6H PRN, Kary Kos, MD, 4 mg at 03/18/21 1761   oxyCODONE (Oxy IR/ROXICODONE) immediate release tablet 10 mg, 10 mg, Oral, Q3H PRN, Kary Kos, MD, 10 mg at 03/24/21 6073   oxyCODONE-acetaminophen (PERCOCET/ROXICET) 5-325 MG per tablet 1-2 tablet, 1-2 tablet, Oral, Q6H PRN, Kary Kos, MD, 2 tablet at 03/23/21 2308   pantoprazole (PROTONIX) EC tablet 40 mg, 40 mg, Oral, QHS, Kary Kos, MD, 40 mg at 03/23/21 2124   sodium chloride flush (NS) 0.9 % injection 3 mL, 3 mL, Intravenous, Q12H, Kary Kos, MD, 3 mL at 03/24/21 7106   sodium chloride flush (NS) 0.9 % injection 3 mL, 3 mL, Intravenous, PRN, Kary Kos, MD, 3 mL at 03/18/21 0059  Patients Current Diet:  Diet Order             Diet Heart Room service appropriate? Yes; Fluid  consistency: Thin  Diet effective now                   Precautions / Restrictions Precautions Precautions: Back,Fall Precaution Booklet Issued: Yes (comment) Precaution Comments: Verbally reviewed; pt able to  stae 2/3 with cues for "lift" Restrictions Weight Bearing Restrictions: No   Has the patient had 2 or more falls or a fall with injury in the past year?No  Prior Activity Level Community (5-7x/wk): independent, driving and working  Prior Functional Level Prior Function Level of Independence: Independent Comments: works as a Lobbyist Care:  Did the patient need help bathing, dressing, using the toilet or eating?  Independent  Indoor Mobility: Did the patient need assistance with walking from room to room (with or without device)? Independent  Stairs: Did the patient need assistance with internal or external stairs (with or without device)? Independent  Functional Cognition: Did the patient need help planning regular tasks such as shopping or remembering to take medications? Independent  Home Assistive Devices / Equipment Home Equipment: None  Prior Device Use: Indicate devices/aids used by the patient prior to current illness, exacerbation or injury? None of the above  Current Functional Level Cognition  Overall Cognitive Status: Within Functional Limits for tasks assessed Orientation Level: Oriented X4 General Comments: pt able to follow all commands and cues, limited by pain, internally distracted    Extremity Assessment (includes Sensation/Coordination)  Upper Extremity Assessment: Overall WFL for tasks assessed  Lower Extremity Assessment: Defer to PT evaluation RLE Deficits / Details: Grossly 4/5 LLE Deficits / Details: Grossly 4/5    ADLs  Overall ADL's : Needs assistance/impaired Eating/Feeding: Independent Grooming: Wash/dry face,Set up Grooming Details (indicate cue type and reason): feeling hot s/p session and  washing face Upper Body Bathing: Minimal assistance,Sitting Lower Body Bathing: Maximal assistance,Sit to/from stand Upper Body Dressing : Minimal assistance,Sitting Lower Body Dressing: Supervision/safety,Sitting/lateral leans,Sit to/from stand Lower Body Dressing Details (indicate cue type and reason): sitting up in bed bringing BLEs in towards pt to don socks. Toilet Transfer: Minimal Publishing rights manager Details (indicate cue type and reason): simulated to recliner; chair follow for safety due to pain Toileting- Clothing Manipulation and Hygiene: Moderate assistance,Sit to/from stand Functional mobility during ADLs: Minimal assistance,+2 for safety/equipment,Rolling walker,Cueing for safety,Cueing for sequencing (+2 for chair follow and for stability) General ADL Comments: Pt limited by pain and decreased ability to care for self. Pt performing LB dressing in bed and reports having family at home to assist.    Mobility  Overal bed mobility: Needs Assistance Bed Mobility: Rolling,Sidelying to Sit Rolling: Supervision Sidelying to sit: Min assist Sit to supine: Mod assist Sit to sidelying: Min assist General bed mobility comments: discussed log roll to side, pt with good adherence with verbal cues and use of rail    Transfers  Overall transfer level: Needs assistance Equipment used: Rolling walker (2 wheeled) Transfers: Sit to/from Stand Sit to Stand: Min assist Stand pivot transfers: Min assist,+2 physical assistance,+2 safety/equipment General transfer comment: minA with increased time to power up, increased pain with activity but no overt LOB    Ambulation / Gait / Stairs / Wheelchair Mobility  Ambulation/Gait Ambulation/Gait assistance: Herbalist (Feet): 15 Feet (+ 15 ft) Assistive device: Rolling walker (2 wheeled) Gait Pattern/deviations: Step-to pattern,Decreased stride length,Wide base of support,Decreased dorsiflexion - right,Decreased  weight shift to right General Gait Details: ot ambulating 15 ft to bathroom, then seated rest break, then 15 ft with RW and chair follow until pt fatigued. Pt with decreased wt acceptance to LLE and decreased clearance with each step bilaterally. Gait velocity: decreased Gait velocity interpretation: <1.31 ft/sec, indicative of household ambulator    Posture / Balance Dynamic Sitting Balance Sitting balance - Comments: Reliant on at least single UE support due to pain Balance Overall balance assessment: Needs assistance Sitting-balance support: Feet supported,Single extremity supported Sitting balance-Leahy Scale: Poor Sitting balance - Comments: Reliant on at least single UE support due to pain Standing balance support: Bilateral upper extremity supported Standing balance-Leahy Scale: Poor Standing balance comment: reliant on RW  Special needs/care consideration Spasms affecting mobility progressions     Previous Home Environment  Living Arrangements: Spouse/significant other,Children  Lives With: Spouse,Family Available Help at Discharge: Family,Available 24 hours/day Type of Home: House Home Layout: Two level,Able to live on main level with bedroom/bathroom,1/2 bath on main level Alternate Level Stairs-Number of Steps: full flight Home Access: Stairs to enter Entrance Stairs-Number of Steps: 3 Bathroom Shower/Tub: Chiropodist: Standard Bathroom Accessibility: Yes How Accessible: Accessible via walker Industry: No Additional Comments: Pt's spouse is moving bedroom furniture downstairs so pt can stay on main level.  Has 1/2 bath on first floor.  She has 5 and 79 y.o. children, and has a 77 y.o. daughter who will stay after discharge to assist her  Discharge Living Setting Plans for Discharge Living Setting: Patient's home,Lives with (comment) (spouse and children) Type of Home at Discharge: House Discharge Home Layout: Two level,1/2 bath on main  level,Bed/bath upstairs (spouse setting up downstairs bedroom) Alternate Level Stairs-Number of Steps: flight Discharge Home Access: Stairs to enter Entrance Stairs-Rails: None Entrance Stairs-Number of Steps: 3 Discharge Bathroom Shower/Tub: Tub/shower unit Discharge Bathroom Toilet: Standard Discharge Bathroom Accessibility: Yes How Accessible: Accessible via walker Does the patient have any problems obtaining your medications?: No  Social/Family/Support Systems Patient Roles: Engineer, building services Information: spouse, [name] Anticipated Caregiver: spouse and adult 65 year old daughter Anticipated Caregiver's Contact Information: see above Ability/Limitations of Caregiver: spouse works, 46 year old will assist Caregiver Availability: 24/7 Discharge Plan Discussed with Primary Caregiver: Yes Is Caregiver In Agreement with Plan?: Yes Does Caregiver/Family have Issues with Lodging/Transportation while Pt is in Rehab?: No  Goals Patient/Family Goal for Rehab: supervision PT, supervision to min OT Expected length of stay: 8-12 days Pt/Family Agrees to Admission and willing to participate: Yes Program Orientation Provided & Reviewed with Pt/Caregiver Including Roles  & Responsibilities: Yes  Decrease burden of Care through IP rehab admission: n/a  Possible need for SNF placement upon discharge:not anticipated  Patient Condition: This patient's medical and functional status has changed since the consult dated: 03/21/2021 in which the Rehabilitation Physician determined and documented that the patient's condition is appropriate for intensive rehabilitative care in an inpatient rehabilitation facility. See "History of Present Illness" (above) for medical update. Functional changes are: mod assist. Patient's medical and functional status update has been discussed with the Rehabilitation physician and patient remains appropriate for inpatient rehabilitation. Will admit to inpatient rehab  today.  Preadmission Screen Completed By:  Cleatrice Burke, RN, 03/24/2021 11:10 AM ______________________________________________________________________   Discussed status with Dr. Posey Pronto on 03/24/2021 at  1111 and received approval for admission today.  Admission Coordinator:  Cleatrice Burke, time 7035 Date 03/24/2021

## 2021-03-22 NOTE — Progress Notes (Signed)
Inpatient Rehabilitation Admissions Coordinator  Patient is appropriate for CIR admit once her pain management is more stabilized.  Today has received Oxy IR, Percocet, Dilaudid and Robaxin since midnight. I will follow up today.  Danne Baxter, RN, MSN Rehab Admissions Coordinator [phone] 03/22/2021 8:07 AM

## 2021-03-22 NOTE — H&P (Signed)
Physical Medicine and Rehabilitation Admission H&P    CC: Bilateral lower extremity weakness  HPI: Shannon [name] is a 40 year old female with history of ADD, preeclampsia, depression, obesity, back pain with numbness and tingling BLE/feet, spasms with involuntary spasms, falls and bladder incontinence with progressive symptoms for past 2 months.  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  History taken from chart review and patient.  She was admitted on 03/17/2021 for decompressive L4 and L5 laminectomy for tumor excision with Dr. Saintclair Halsted.  [facility] course complicated by lower extremity spasms, tachycardia as well as dysesthesias.  She was limited by pain and Medrol Dosepak was started on 03/21/2021 2 and on 03/25/2021.  Therapy ongoing with some improvement in total body spasms--still worse at nights, ongoing RLE weakness, sensory deficits and weakness. CIR recommended due to functional decline.  Please see preadmission assessment from earlier today as well.   Review of Systems  Constitutional: Negative for chills and fever.  HENT: Negative for hearing loss and tinnitus.   Eyes: Negative for blurred vision and double vision.  Respiratory: Negative for cough and shortness of breath.   Cardiovascular: Negative for chest pain and palpitations.  Gastrointestinal: Negative for abdominal pain, constipation and heartburn.  Genitourinary: Positive for urgency.  Musculoskeletal: Positive for back pain, falls and myalgias.       Good and bad days  Neurological: Positive for speech change, focal weakness and weakness. Negative for dizziness and headaches.  Psychiatric/Behavioral: The patient is nervous/anxious and has insomnia (due to spasms. ).   All other systems reviewed and are negative.   Past Medical History:  Diagnosis Date  . ADD (attention deficit disorder)   . Anemia   . Anxiety   . Asthma   . Back pain   . Bilateral swelling of feet   . Chest pain   . Constipation    . Depression   . Hyperlipidemia   . Hypertension    Preeclampsia post delivery  . Irregular heart beat   . Joint pain   . Multiple food allergies   . Obesity   . Palpitations in pediatric patient   . PONV (postoperative nausea and vomiting)    PONV; Also emotional upon waking up  . Shortness of breath   . Vitamin D deficiency     Past Surgical History:  Procedure Laterality Date  . DILATATION & CURRETTAGE/HYSTEROSCOPY WITH RESECTOCOPE N/A 02/03/2014   Procedure: DILATATION & CURETTAGE/HYSTEROSCOPY ;  Surgeon: Delice Lesch, MD;  Location: [facility];  Service: Gynecology;  Laterality: N/A;  . DILATION AND CURETTAGE OF UTERUS    . LAMINECTOMY N/A 03/17/2021   Procedure: Laminectomy - Lumbar Four-Lumbar Five for Intradural mass;  Surgeon: Kary Kos, MD;  Location: [facility];  Service: Neurosurgery;  Laterality: N/A;  3C  . MYOMECTOMY N/A 02/03/2014   Procedure: MYOMECTOMY ;  Surgeon: Delice Lesch, MD;  Location: [facility];  Service: Gynecology;  Laterality: N/A;  . SALPINGECTOMY    . TONSILLECTOMY    . tonsils and adnoids removed    . TUBAL LIGATION N/A 08/19/2015   Procedure: POST PARTUM TUBAL LIGATION;  Surgeon: Everett Graff, MD;  Location: [facility];  Service: Gynecology;  Laterality: N/A;  . WISDOM TOOTH EXTRACTION      Family History  Problem Relation Age of Onset  . Hypertension Mother   . Diabetes Mother   . Mental illness Mother   . Hyperlipidemia Mother   . Stroke Mother   . Depression Mother   .  Anxiety disorder Mother   . Bipolar disorder Mother   . Liver disease Mother   . Sleep apnea Mother   . Obesity Mother   . Hypertension Father   . Prostate cancer Father   . Asthma Brother   . Heart disease Maternal Grandmother   . Hypertension Maternal Grandmother   . Cancer Paternal Grandmother        breast cancer  . Seizures Brother     Social History:  Married. Was working as Management consultant till a month ago. She  reports that she has never smoked. She has  never used smokeless tobacco. She reports that she does not drink alcohol and does not use drugs.   Allergies  Allergen Reactions  . Diclofenac Rash  . Pineapple Shortness Of Breath  . Ceftriaxone Sodium Hives  . Diflucan [Fluconazole] Hives    Raw rash on abdomen and she had sores and swelling in mouth   . Eggs Or Egg-Derived Products Nausea And Vomiting    Can eat cake made with eggs  . Bactrim [Sulfamethoxazole-Trimethoprim] Hives  . Latex Rash    Medications Prior to Admission  Medication Sig Dispense Refill  . DULoxetine (CYMBALTA) 60 MG capsule Take 1 capsule by mouth daily.   1  . oxyCODONE-acetaminophen (PERCOCET/ROXICET) 5-325 MG tablet Take 1-2 tablets by mouth every 6 (six) hours as needed for severe pain. 20 tablet 0  . predniSONE (DELTASONE) 20 MG tablet 3 po once a day for 2 days, then 2 po once a day for 3 days, then 1 po once a day for 3 days 15 tablet 0  . VYVANSE 70 MG capsule Take 70 mg by mouth every morning.    Marland Kitchen albuterol (PROVENTIL HFA;VENTOLIN HFA) 108 (90 Base) MCG/ACT inhaler Inhale 2 puffs into the lungs every 6 (six) hours as needed for wheezing or shortness of breath. 1 Inhaler 3  . cyclobenzaprine (FLEXERIL) 10 MG tablet Take 1 tablet (10 mg total) by mouth at bedtime. As needed for back pain 20 tablet 0  . ibuprofen (ADVIL) 200 MG tablet Take 800 mg by mouth every 6 (six) hours as needed.    Marland Kitchen ibuprofen (ADVIL) 600 MG tablet Take 1 tablet (600 mg total) by mouth every 6 (six) hours as needed. (Patient not taking: No sig reported) 30 tablet 0  . ondansetron (ZOFRAN ODT) 8 MG disintegrating tablet 1/2- 1 tablet q 8 hr prn nausea, vomiting (Patient not taking: No sig reported) 20 tablet 0  . OVER THE COUNTER MEDICATION Take 1 tablet by mouth daily at 12 noon. Nervive nerve pain relief    . OVER THE COUNTER MEDICATION Take 2 tablets by mouth daily at 12 noon. Magnilife leg and back pain.      Drug Regimen Review  Drug regimen was reviewed and remains  appropriate with no significant issues identified  Home: Home Living Family/patient expects to be discharged to:: Private residence Living Arrangements: Spouse/significant other,Children Available Help at Discharge: Family,Available 24 hours/day Type of Home: House Home Access: Stairs to enter [employer] of Steps: 3 Home Layout: Two level,Able to live on main level with bedroom/bathroom,1/2 bath on main level Alternate Level Stairs-Number of Steps: full flight Bathroom Shower/Tub: Chiropodist: Standard Bathroom Accessibility: Yes Home Equipment: None Additional Comments: Pt's spouse is moving bedroom furniture downstairs so pt can stay on main level.  Has 1/2 bath on first floor.  She has 5 and 52 y.o. children, and has a 100 y.o. daughter who will  stay after discharge to assist her  Lives With: Spouse,Family   Functional History: Prior Function Level of Independence: Independent Comments: works as a Futures trader Status:  Mobility: Bed Mobility Overal bed mobility: Needs Assistance Bed Mobility: Rolling,Sidelying to Sit Rolling: Supervision Sidelying to sit: Min assist Sit to supine: Mod assist Sit to sidelying: Min assist General bed mobility comments: discussed log roll to side, pt with good adherence with verbal cues and use of rail Transfers Overall transfer level: Needs assistance Equipment used: Rolling walker (2 wheeled) Transfers: Sit to/from Stand Sit to Stand: Min assist Stand pivot transfers: Min assist,+2 physical assistance,+2 safety/equipment General transfer comment: minA with increased time to power up, increased pain with activity but no overt LOB Ambulation/Gait Ambulation/Gait assistance: Min assist Gait Distance (Feet): 15 Feet (+ 15 ft) Assistive device: Rolling walker (2 wheeled) Gait Pattern/deviations: Step-to pattern,Decreased stride length,Wide base of support,Decreased dorsiflexion -  right,Decreased weight shift to right General Gait Details: ot ambulating 15 ft to bathroom, then seated rest break, then 15 ft with RW and chair follow until pt fatigued. Pt with decreased wt acceptance to LLE and decreased clearance with each step bilaterally. Gait velocity: decreased Gait velocity interpretation: <1.31 ft/sec, indicative of household ambulator    ADL: ADL Overall ADL's : Needs assistance/impaired Eating/Feeding: Independent Grooming: Wash/dry face,Set up Grooming Details (indicate cue type and reason): feeling hot s/p session and washing face Upper Body Bathing: Minimal assistance,Sitting Lower Body Bathing: Maximal assistance,Sit to/from stand Upper Body Dressing : Minimal assistance,Sitting Lower Body Dressing: Supervision/safety,Sitting/lateral leans,Sit to/from stand Lower Body Dressing Details (indicate cue type and reason): sitting up in bed bringing BLEs in towards pt to don socks. Toilet Transfer: Minimal Publishing rights manager Details (indicate cue type and reason): simulated to recliner; chair follow for safety due to pain Toileting- Clothing Manipulation and Hygiene: Moderate assistance,Sit to/from stand Functional mobility during ADLs: Minimal assistance,+2 for safety/equipment,Rolling walker,Cueing for safety,Cueing for sequencing (+2 for chair follow and for stability) General ADL Comments: Pt limited by pain and decreased ability to care for self. Pt performing LB dressing in bed and reports having family at home to assist.  Cognition: Cognition Overall Cognitive Status: Within Functional Limits for tasks assessed Orientation Level: Oriented X4 Cognition Arousal/Alertness: Awake/alert Behavior During Therapy: WFL for tasks assessed/performed Overall Cognitive Status: Within Functional Limits for tasks assessed General Comments: pt able to follow all commands and cues, limited by pain, internally distracted   Blood pressure (!)  140/106, pulse (!) 106, temperature 98.3 F (36.8 C), resp. rate 15, last menstrual period 03/03/2021, SpO2 100 %, unknown if currently breastfeeding. Physical Exam Vitals and nursing note reviewed.  Constitutional:      General: She is not in acute distress.    Appearance: She is obese.  HENT:     Head: Normocephalic and atraumatic.     Right Ear: External ear normal.     Left Ear: External ear normal.     Nose: Nose normal.  Eyes:     General:        Right eye: No discharge.        Left eye: No discharge.     Extraocular Movements: Extraocular movements intact.  Cardiovascular:     Rate and Rhythm: Normal rate and regular rhythm.  Pulmonary:     Effort: Pulmonary effort is normal. No respiratory distress.     Breath sounds: No stridor.  Abdominal:     General: Abdomen is flat. Bowel sounds are normal.  There is no distension.  Musculoskeletal:        General: No swelling or tenderness.     Cervical back: Normal range of motion and neck supple.     Comments: No edema or tenderness in extremities  Skin:    General: Skin is warm and dry.     Comments: Back incision intact with steri strips in place.   Neurological:     Mental Status: She is alert.     Comments: Alert and oriented Motor: Bilateral upper extremities: 5/5 proximal distal Bilateral lower extremities: Flexion, knee extension 4 -/5, ankle dorsiflexion 5/5 Sensation intact light touch  Psychiatric:        Mood and Affect: Mood normal.        Behavior: Behavior normal.        Thought Content: Thought content normal.    No results found for this or any previous visit (from the past 48 hour(s)). No results found.   Medical Problem List and Plan: 1.  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    -patient may not shower  -ELOS/Goals: 10-14 days/supervision/mod I  Admit to CIR 2.  Antithrombotics: -DVT/anticoagulation:  Pharmaceutical: Heparin  -antiplatelet therapy: N/A 3.  Pain Management: Oxycodone prn  --On Gabapentin tid.   --will add baclofen tid prn. Continue flexeril at bedtime.   Monitor with increased exertion 4. Mood: LCSW to follow for evaluation and support  -antipsychotic agents: N/AA 5. Neuropsych: This patient is capable of making decisions on her own behalf. 6. Skin/Wound Care: Monitor incision for healing.  -- Routine pressure-relief measures. 7. Fluids/Electrolytes/Nutrition: Monitor intake/output.  CMP ordered 8.  Elevated blood pressure: Trending upwards question due to methylprednisolone and/or pain.  -- Monitor 3 times daily.  Hydralazine prn for 24 hours.   Monitor with increased exertion 9. Neurogenic bladder: Incontinence has resolved but now with urgency/feeling of not emptying fully  PVRs ordered 10. Anxiety/ADD/ADHD/Depression: has been under lot of stress.   --Continue Vyvanse and Cymbalta  Team support  Bary Leriche, PA-C 03/24/2021  I have personally performed a face to face diagnostic evaluation, including, but not limited to relevant history and physical exam findings, of this patient and developed relevant assessment and plan.  Additionally, I have reviewed and concur with the physician assistant's documentation above.  Delice Lesch, MD, ABPMR

## 2021-03-22 NOTE — Progress Notes (Signed)
Subjective: Patient reports still having some muscle spasms but has ambulated today   Objective: Vital signs in last 24 hours: Temp:  [98.1 F (36.7 C)-99.1 F (37.3 C)] 99.1 F (37.3 C) (06/01 0804) Pulse Rate:  [103-109] 104 (06/01 0804) Resp:  [16-18] 16 (06/01 0804) BP: (134-154)/(86-108) 134/86 (06/01 0804) SpO2:  [95 %-98 %] 97 % (06/01 0804)  Intake/Output from previous day: 05/31 0701 - 06/01 0700 In: 760 [P.O.:760] Out: 1300 [Urine:1300] Intake/Output this shift: No intake/output data recorded.  Neurologic: Grossly normal  Lab Results: Lab Results  Component Value Date   WBC 8.2 03/14/2021   HGB 12.7 03/14/2021   HCT 40.8 03/14/2021   MCV 82.6 03/14/2021   PLT 259 03/14/2021   No results found for: INR, PROTIME BMET Lab Results  Component Value Date   NA 137 03/14/2021   K 4.4 03/14/2021   CL 105 03/14/2021   CO2 28 03/14/2021   GLUCOSE 92 03/14/2021   BUN 7 03/14/2021   CREATININE 0.88 03/14/2021   CALCIUM 9.4 03/14/2021    Studies/Results: No results found.  Assessment/Plan: Doing well, awaiting CIR placement. Ok to discharge when bed available.    LOS: 5 days    Ocie Cornfield Coen Miyasato 03/22/2021, 11:25 AM

## 2021-03-22 NOTE — Progress Notes (Addendum)
Occupational Therapy Treatment Patient Details Name: Shannon [name] MRN: [id number] DOB: Jul 04, 1981 Today's Date: 03/22/2021    History of present illness This 40 y.o. female admitted with progressive worsening back pain with numbness and tingling in legs and feet.  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  She underwent Decompressive lumbar laminectomy L4 and L5 and facetectomies for resection of tumor.  PMH Includes: HTN, depression, anxiety, asthma   OT comments  Pt progressing to OOB tasks. Pt limited by pain and decreased ability to care for self. Pt performing LB dressing in bed and reports having family at home to assist.  Pt minA overall for mobility with heavy reliance on RW and stepping on toes only with RLE d/t pain and spasms s/p nerve pain meds received. Pt minA for short mobility in room from one side of bed to the other with 1 seated rest break. Pt requiring modA for BLE management for bed mobility. Pt would greatly benefit from continued OT skilled services. Ice applied to RLE/back pain site. OT following acutely.   Follow Up Recommendations  CIR    Equipment Recommendations  3 in 1 bedside commode    Recommendations for Other Services      Precautions / Restrictions Precautions Precautions: Back;Fall Precaution Booklet Issued: Yes (comment) Precaution Comments: Verbally reviewed; pt able to  stae 2/3 with cues for "lift" Restrictions Weight Bearing Restrictions: No       Mobility Bed Mobility Overal bed mobility: Needs Assistance Bed Mobility: Rolling;Sidelying to Sit;Sit to Supine Rolling: Supervision Sidelying to sit: Min guard   Sit to supine: Mod assist   General bed mobility comments: Log roll to R side and use of rail with HOB elevated to 10*; pt requiring assist for BLEs for sit to supine    Transfers Overall transfer level: Needs assistance Equipment used: Rolling walker (2 wheeled) Transfers: Sit to/from  Stand Sit to Stand: Min assist         General transfer comment: MinA +1 for stability and power-up; pt with wide BOS.    Balance Overall balance assessment: Needs assistance Sitting-balance support: Feet supported;Single extremity supported Sitting balance-Leahy Scale: Poor Sitting balance - Comments: Reliant on at least single UE support due to pain   Standing balance support: Bilateral upper extremity supported Standing balance-Leahy Scale: Poor Standing balance comment: reliant on RW                           ADL either performed or assessed with clinical judgement   ADL Overall ADL's : Needs assistance/impaired     Grooming: Wash/dry face;Set up Grooming Details (indicate cue type and reason): feeling hot s/p session and washing face             Lower Body Dressing: Supervision/safety;Sitting/lateral leans;Sit to/from stand Lower Body Dressing Details (indicate cue type and reason): sitting up in bed bringing BLEs in towards pt to don socks. Toilet Transfer: Minimal assistance;Ambulation;RW Toilet Transfer Details (indicate cue type and reason): simulated to recliner; chair follow for safety due to pain         Functional mobility during ADLs: Minimal assistance;+2 for safety/equipment;Rolling walker;Cueing for safety;Cueing for sequencing (+2 for chair follow and for stability) General ADL Comments: Pt limited by pain and decreased ability to care for self. Pt performing LB dressing in bed and reports having family at home to assist.     Vision       Perception  Praxis      Cognition Arousal/Alertness: Awake/alert Behavior During Therapy: WFL for tasks assessed/performed Overall Cognitive Status: Within Functional Limits for tasks assessed                                 General Comments: A/Ox4        Exercises     Shoulder Instructions       General Comments Pt on RA.    Pertinent Vitals/ Pain       Pain  Assessment: Faces Faces Pain Scale: Hurts worst Pain Location: back, R buttocks Pain Descriptors / Indicators: Grimacing;Guarding;Burning;Spasm Pain Intervention(s): Limited activity within patient's tolerance;Monitored during session;Repositioned  Home Living                                          Prior Functioning/Environment              Frequency  Min 2X/week        Progress Toward Goals  OT Goals(current goals can now be found in the care plan section)  Progress towards OT goals: Progressing toward goals  Acute Rehab OT Goals Patient Stated Goal: less pain, be able to walk to toilet OT Goal Formulation: With patient Time For Goal Achievement: 04/03/21 Potential to Achieve Goals: Good  Plan Discharge plan remains appropriate    Co-evaluation    PT/OT/SLP Co-Evaluation/Treatment: Yes Reason for Co-Treatment: Complexity of the patient's impairments (multi-system involvement)   OT goals addressed during session: ADL's and self-care      AM-PAC OT "6 Clicks" Daily Activity     Outcome Measure   Help from another person eating meals?: None Help from another person taking care of personal grooming?: A Little Help from another person toileting, which includes using toliet, bedpan, or urinal?: A Lot Help from another person bathing (including washing, rinsing, drying)?: A Lot Help from another person to put on and taking off regular upper body clothing?: A Little Help from another person to put on and taking off regular lower body clothing?: A Lot 6 Click Score: 16    End of Session Equipment Utilized During Treatment: Rolling walker;Gait belt  OT Visit Diagnosis: Unsteadiness on feet (R26.81);Pain Pain - Right/Left: Right Pain - part of body: Leg (RLE)   Activity Tolerance Patient limited by pain   Patient Left in bed;with call bell/phone within reach   Nurse Communication Mobility status        Time: 1458-1530 OT Time  Calculation (min): 32 min  Charges: OT General Charges $OT Visit: 1 Visit OT Treatments $Self Care/Home Management : 8-22 mins  Jefferey Pica, OTR/L Acute Rehabilitation Services Pager: [phone] Office: [phone]    Kimla Furth C 03/22/2021, 3:48 PM

## 2021-03-22 NOTE — Progress Notes (Signed)
Inpatient Rehabilitation Admissions Coordinator  I have insurance approval for CIR, but no bed today. I met at bedside with patient and discussed goals and expectations of a possible CIR admit. I will follow up tomorrow.Marlana Salvage is having significant pain/ spasms, but does work through it to mobilize.  Danne Baxter, RN, MSN Rehab Admissions Coordinator [phone] 03/22/2021 11:29 AM

## 2021-03-22 NOTE — Progress Notes (Signed)
Physical Therapy Treatment Patient Details Name: Shannon [name] MRN: [id number] DOB: 03/24/1981 Today's Date: 03/22/2021    History of Present Illness This 40 y.o. female admitted with progressive worsening back pain with numbness and tingling in legs and feet.  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  She underwent Decompressive lumbar laminectomy L4 and L5 and facetectomies for resection of tumor.  PMH Includes: HTN, depression, anxiety, asthma    PT Comments    Pt still experiencing significant pain/muscle spasms despite premedication. Pt ambulating 5 ft, then an additional 5 ft with a walker and seated rest break in between. Limited RLE weightbearing due to radicular pain. Positioned in left side lying, applied ice post session and RN notified. Continue to recommend comprehensive inpatient rehab (CIR) for post-acute therapy needs.   Follow Up Recommendations  CIR     Equipment Recommendations  Rolling walker with 5" wheels;3in1 (PT);Wheelchair (measurements PT);Wheelchair cushion (measurements PT)    Recommendations for Other Services       Precautions / Restrictions Precautions Precautions: Back;Fall Precaution Booklet Issued: Yes (comment) Precaution Comments: Verbally reviewed; pt able to  state 2/3 with cues for "lift" Restrictions Weight Bearing Restrictions: No    Mobility  Bed Mobility Overal bed mobility: Needs Assistance Bed Mobility: Rolling;Sidelying to Sit;Sit to Supine Rolling: Supervision Sidelying to sit: Min guard   Sit to supine: Mod assist   General bed mobility comments: Log roll to R side and use of rail with HOB elevated to 10*; pt requiring assist for BLEs for sit to supine    Transfers Overall transfer level: Needs assistance Equipment used: Rolling walker (2 wheeled) Transfers: Sit to/from Stand Sit to Stand: Min assist         General transfer comment: MinA +1 for stability and power-up; pt with  wide BOS.  Ambulation/Gait Ambulation/Gait assistance: Min assist Gait Distance (Feet): 10 Feet (5", 5") Assistive device: Rolling walker (2 wheeled) Gait Pattern/deviations: Step-to pattern;Decreased stride length;Wide base of support;Decreased dorsiflexion - right;Decreased weight shift to right Gait velocity: decreased Gait velocity interpretation: <1.31 ft/sec, indicative of household ambulator General Gait Details: Pt ambulating 5 ft, then 5 ft with a seated rest break and minA for balance. Pt with decreased RLE weightbearing, with increased plantarflexion due to radicular pain/spasms. Cues provided for sequencing/technique. heavily guarded and extremely effortful/slow gait   Stairs             Wheelchair Mobility    Modified Rankin (Stroke Patients Only)       Balance Overall balance assessment: Needs assistance Sitting-balance support: Feet supported;Single extremity supported Sitting balance-Leahy Scale: Poor Sitting balance - Comments: Reliant on at least single UE support due to pain   Standing balance support: Bilateral upper extremity supported Standing balance-Leahy Scale: Poor Standing balance comment: reliant on RW                            Cognition Arousal/Alertness: Awake/alert Behavior During Therapy: WFL for tasks assessed/performed Overall Cognitive Status: Within Functional Limits for tasks assessed                                 General Comments: A/Ox4      Exercises      General Comments General comments (skin integrity, edema, etc.): Pt on RA.      Pertinent Vitals/Pain Pain Assessment: Faces Faces Pain Scale: Hurts  worst Pain Location: back, R buttocks Pain Descriptors / Indicators: Grimacing;Guarding;Burning;Spasm Pain Intervention(s): Limited activity within patient's tolerance;Monitored during session;Repositioned;Premedicated before session;Ice applied    Home Living                       Prior Function            PT Goals (current goals can now be found in the care plan section) Acute Rehab PT Goals Patient Stated Goal: less pain, be able to walk to toilet Potential to Achieve Goals: Good    Frequency    Min 5X/week      PT Plan Current plan remains appropriate    Co-evaluation PT/OT/SLP Co-Evaluation/Treatment: Yes Reason for Co-Treatment: Other (comment) (pain tolerance)   OT goals addressed during session: ADL's and self-care      AM-PAC PT "6 Clicks" Mobility   Outcome Measure  Help needed turning from your back to your side while in a flat bed without using bedrails?: A Little Help needed moving from lying on your back to sitting on the side of a flat bed without using bedrails?: A Little Help needed moving to and from a bed to a chair (including a wheelchair)?: A Little Help needed standing up from a chair using your arms (e.g., wheelchair or bedside chair)?: A Little Help needed to walk in hospital room?: A Little Help needed climbing 3-5 steps with a railing? : A Lot 6 Click Score: 17    End of Session   Activity Tolerance: Patient limited by pain Patient left: with call bell/phone within reach;in bed Nurse Communication: Mobility status PT Visit Diagnosis: Unsteadiness on feet (R26.81);Difficulty in walking, not elsewhere classified (R26.2);Pain     Time: 0086-7619 PT Time Calculation (min) (ACUTE ONLY): 26 min  Charges:  $Gait Training: 8-22 mins                     Wyona Almas, PT, DPT Acute Rehabilitation Services Pager [phone] Office [phone]    Deno Etienne 03/22/2021, 5:08 PM

## 2021-03-23 MED ORDER — METHOCARBAMOL 500 MG PO TABS: 500.0000 mg | ORAL_TABLET | Freq: Four times a day (QID) | ORAL | 0 refills | Status: DC

## 2021-03-23 MED ORDER — HYDRALAZINE HCL 20 MG/ML IJ SOLN
10.0000 mg | INTRAMUSCULAR | Status: DC | PRN
  Administered 2021-03-23: 10 mg via INTRAVENOUS
  Filled 2021-03-23: qty 1

## 2021-03-23 MED ORDER — OXYCODONE-ACETAMINOPHEN 5-325 MG PO TABS: 1.0000 | ORAL_TABLET | ORAL | 0 refills | Status: DC | PRN

## 2021-03-23 NOTE — TOC Initial Note (Signed)
Transition of Care [facility]) - Initial/Assessment Note    Patient Details  Name: Shannon [name] MRN: [id number] Date of Birth: 24-Dec-1980  Transition of Care [facility]) CM/SW Contact:    Verdell Carmine, RN Phone Number: 03/23/2021, 1:49 PM  Clinical Narrative:                 Patient post lami with tumor resection, having a great deal of back spasms and pain, radiculopathy. PT and OT evaluation reveals recommendation for CIR, CIR saw patient and accepted however no beds currently. Will continue to monitor CIR beds and will let us know if one becomes available.   Expected Discharge Plan: IP Rehab Facility Barriers to Discharge: Other (must enter comment) (no CIR bed)   Patient Goals and CMS Choice        Expected Discharge Plan and Services Expected Discharge Plan: [place]   Discharge Planning Services: CM Consult   Living arrangements for the past 2 months: Single Family Home                                      Prior Living Arrangements/Services Living arrangements for the past 2 months: Single Family Home   Patient language and need for interpreter reviewed:: Yes        Need for Family Participation in Patient Care: Yes (Comment) Care giver support system in place?: Yes (comment)   Criminal Activity/Legal Involvement Pertinent to Current Situation/Hospitalization: No - Comment as needed  Activities of Daily Living      Permission Sought/Granted                  Emotional Assessment       Orientation: : Oriented to Place,Oriented to  Time,Oriented to Situation,Oriented to Self   Psych Involvement: No (comment)  Admission diagnosis:  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 Elevated blood-pressure reading without diagnosis of hypertension 05/26/2019  . Class 2 obesity due to excess calories without serious comorbidity  with body mass index (BMI) of 39.0 to 39.9 in adult 05/26/2019  . Headache disorder 08/28/2015  . Pre-eclampsia in postpartum period 08/26/2015  . Susceptible to varicella (non-immune), currently pregnant 08/18/2015  . History of myomectomy 08/18/2015  . Latex allergy 08/18/2015  . Hypertensive disorder-resolved with weight loss 08/18/2015  . SVD (spontaneous vaginal delivery) 08/18/2015  . Depression 01/19/2013  . Vitamin D deficiency 11/21/2012  . OVERWEIGHT 03/25/2009  . BACK PAIN WITH RADICULOPATHY 03/23/2009  . ASTHMA, UNSPECIFIED, UNSPECIFIED STATUS 10/03/2008   PCP:  Glendale Chard, MD Pharmacy:   [place], [place] - [place] [address] [place] [address] [place] [address] Phone: [phone] Fax: [phone]     Social Determinants of Health (SDOH) Interventions    Readmission Risk Interventions No flowsheet data found.

## 2021-03-23 NOTE — Progress Notes (Signed)
Subjective: Patient reports yesterday was a little better as far as back spasms  Objective: Vital signs in last 24 hours: Temp:  [98.3 F (36.8 C)-99.1 F (37.3 C)] 98.5 F (36.9 C) (06/02 0800) Pulse Rate:  [93-112] 93 (06/02 0800) Resp:  [15-18] 18 (06/02 0332) BP: (139-163)/(95-104) 149/98 (06/02 0800) SpO2:  [96 %-98 %] 96 % (06/02 0800)  Intake/Output from previous day: 06/01 0701 - 06/02 0700 In: 1020 [P.O.:1020] Out: -  Intake/Output this shift: No intake/output data recorded.  Neurologic: Grossly normal  Lab Results: Lab Results  Component Value Date   WBC 8.2 03/14/2021   HGB 12.7 03/14/2021   HCT 40.8 03/14/2021   MCV 82.6 03/14/2021   PLT 259 03/14/2021   No results found for: INR, PROTIME BMET Lab Results  Component Value Date   NA 137 03/14/2021   K 4.4 03/14/2021   CL 105 03/14/2021   CO2 28 03/14/2021   GLUCOSE 92 03/14/2021   BUN 7 03/14/2021   CREATININE 0.88 03/14/2021   CALCIUM 9.4 03/14/2021    Studies/Results: No results found.  Assessment/Plan: Doing well, spasms improving. Awaiting CIR   LOS: 6 days    Shannon [name] [facility] 03/23/2021, 8:08 AM

## 2021-03-23 NOTE — Progress Notes (Signed)
Physical Therapy Treatment Patient Details Name: Shannon [name] MRN: [id number] DOB: [birth year] Today's Date: 03/23/2021    History of Present Illness This 40 y.o. female admitted with progressive worsening back pain with numbness and tingling in legs and feet.  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  She underwent Decompressive lumbar laminectomy L4 and L5 and facetectomies for resection of tumor.  PMH Includes: HTN, depression, anxiety, asthma    PT Comments    The pt was seen for continued progression of OOB mobility and strengthening and was agreeable despite reports of continued pain this afternoon. The pt reports she has been scared to move since attempting to bathe herself earlier this morning, was reminded of importance of continued mobility and changes in position to reduce stiffness and soreness following surgery. The pt was able to complete multiple short distance bouts ambulation in the room, but required minA to steady and chair follow for safety due to increased weakness and pain in BLE. The pt completed multiple sit-stand transfers with minA and significantly increased time to power up. Manual therapy used on bilateral paraspinals as pt describing muscular pain and spasms in back, the pain was reproducible with light touch, will continue to attempt pain reduction through movement and addressing muscular trigger points. The pt will continue to benefit from skilled PT acutely and at [facility] when able.    Follow Up Recommendations  CIR     Equipment Recommendations  Rolling walker with 5" wheels;3in1 (PT);Wheelchair (measurements PT);Wheelchair cushion (measurements PT)    Recommendations for Other Services       Precautions / Restrictions Precautions Precautions: Back;Fall Precaution Booklet Issued: Yes (comment) Precaution Comments: Verbally reviewed; pt able to  stae 2/3 with cues for "lift" Restrictions Weight Bearing Restrictions:  No    Mobility  Bed Mobility Overal bed mobility: Needs Assistance Bed Mobility: Rolling;Sidelying to Sit Rolling: Supervision Sidelying to sit: Min assist       General bed mobility comments: discussed log roll to side, pt with good adherence with verbal cues and use of rail    Transfers Overall transfer level: Needs assistance Equipment used: Rolling walker (2 wheeled) Transfers: Sit to/from Stand Sit to Stand: Min assist         General transfer comment: minA with increased time to power up, increased pain with activity but no overt LOB  Ambulation/Gait Ambulation/Gait assistance: Min assist Gait Distance (Feet): 15 Feet (+ 15 ft) Assistive device: Rolling walker (2 wheeled) Gait Pattern/deviations: Step-to pattern;Decreased stride length;Wide base of support;Decreased dorsiflexion - right;Decreased weight shift to right Gait velocity: decreased Gait velocity interpretation: <1.31 ft/sec, indicative of household ambulator General Gait Details: ot ambulating 15 ft to bathroom, then seated rest break, then 15 ft with RW and chair follow until pt fatigued. Pt with decreased wt acceptance to LLE and decreased clearance with each step bilaterally.       Balance Overall balance assessment: Needs assistance Sitting-balance support: Feet supported;Single extremity supported Sitting balance-Leahy Scale: Poor Sitting balance - Comments: Reliant on at least single UE support due to pain   Standing balance support: Bilateral upper extremity supported Standing balance-Leahy Scale: Poor Standing balance comment: reliant on RW                            Cognition Arousal/Alertness: Awake/alert Behavior During Therapy: WFL for tasks assessed/performed Overall Cognitive Status: Within Functional Limits for tasks assessed  General Comments: pt able to follow all commands and cues, limited by pain, internally distracted       Exercises Other Exercises Other Exercises: manual therapy to bilateral paraspinals. pt with radiating pain from multiple spots    General Comments General comments (skin integrity, edema, etc.): VSS on RA      Pertinent Vitals/Pain Pain Assessment: Faces Faces Pain Scale: Hurts whole lot Pain Location: back, R buttocks Pain Descriptors / Indicators: Grimacing;Guarding;Burning;Spasm Pain Intervention(s): Limited activity within patient's tolerance;Monitored during session;Repositioned;Utilized relaxation techniques (manual therapy to back)           PT Goals (current goals can now be found in the care plan section) Acute Rehab PT Goals Patient Stated Goal: less pain, be able to walk to toilet PT Goal Formulation: With patient Time For Goal Achievement: 04/03/21 Potential to Achieve Goals: Good Progress towards PT goals: Progressing toward goals    Frequency    Min 5X/week      PT Plan Current plan remains appropriate       AM-PAC PT "6 Clicks" Mobility   Outcome Measure  Help needed turning from your back to your side while in a flat bed without using bedrails?: A Little Help needed moving from lying on your back to sitting on the side of a flat bed without using bedrails?: A Little Help needed moving to and from a bed to a chair (including a wheelchair)?: A Little Help needed standing up from a chair using your arms (e.g., wheelchair or bedside chair)?: A Little Help needed to walk in hospital room?: A Little Help needed climbing 3-5 steps with a railing? : A Lot 6 Click Score: 17    End of Session Equipment Utilized During Treatment: Gait belt Activity Tolerance: Patient limited by pain Patient left: with call bell/phone within reach;in bed Nurse Communication: Mobility status PT Visit Diagnosis: Unsteadiness on feet (R26.81);Difficulty in walking, not elsewhere classified (R26.2);Pain Pain - Right/Left: Right (and left) Pain - part of body: Hip;Leg  (back)     Time: 3845-3646 PT Time Calculation (min) (ACUTE ONLY): 45 min  Charges:  $Gait Training: 8-22 mins $Therapeutic Exercise: 8-22 mins $Therapeutic Activity: 8-22 mins                     Karma Ganja, PT, DPT   Acute Rehabilitation Department Pager #: [phone]   Otho Bellows 03/23/2021, 5:34 PM

## 2021-03-23 NOTE — Progress Notes (Signed)
Inpatient Rehabilitation Admissions Coordinator  I await bed availability to admit to CIR.  Danne Baxter, RN, MSN Rehab Admissions Coordinator [phone] 03/23/2021 3:42 PM

## 2021-03-24 ENCOUNTER — Encounter (HOSPITAL_COMMUNITY): Payer: Self-pay | Admitting: Physical Medicine and Rehabilitation

## 2021-03-24 ENCOUNTER — Other Ambulatory Visit: Payer: Self-pay

## 2021-03-24 ENCOUNTER — Inpatient Hospital Stay (HOSPITAL_COMMUNITY)
Admission: RE | Admit: 2021-03-24 | Discharge: 2021-04-06 | DRG: 949 | Disposition: A | Discharge status: Home Health | Payer: BC Managed Care – PPO | Source: Intra-hospital | Attending: Physical Medicine and Rehabilitation | Admitting: Physical Medicine and Rehabilitation

## 2021-03-24 DIAGNOSIS — E785 Hyperlipidemia, unspecified: Secondary | ICD-10-CM | POA: Diagnosis present

## 2021-03-24 DIAGNOSIS — Z823 Family history of stroke: Secondary | ICD-10-CM

## 2021-03-24 DIAGNOSIS — Z48811 Encounter for surgical aftercare following surgery on the nervous system: Secondary | ICD-10-CM | POA: Diagnosis present

## 2021-03-24 DIAGNOSIS — Z91018 Allergy to other foods: Secondary | ICD-10-CM | POA: Diagnosis not present

## 2021-03-24 DIAGNOSIS — G8918 Other acute postprocedural pain: Secondary | ICD-10-CM

## 2021-03-24 DIAGNOSIS — Z8249 Family history of ischemic heart disease and other diseases of the circulatory system: Secondary | ICD-10-CM | POA: Diagnosis not present

## 2021-03-24 DIAGNOSIS — I10 Essential (primary) hypertension: Secondary | ICD-10-CM | POA: Diagnosis present

## 2021-03-24 DIAGNOSIS — N319 Neuromuscular dysfunction of bladder, unspecified: Secondary | ICD-10-CM

## 2021-03-24 DIAGNOSIS — F32A Depression, unspecified: Secondary | ICD-10-CM

## 2021-03-24 DIAGNOSIS — M5410 Radiculopathy, site unspecified: Secondary | ICD-10-CM | POA: Diagnosis present

## 2021-03-24 DIAGNOSIS — Z881 Allergy status to other antibiotic agents status: Secondary | ICD-10-CM

## 2021-03-24 DIAGNOSIS — Z83438 Family history of other disorder of lipoprotein metabolism and other lipidemia: Secondary | ICD-10-CM | POA: Diagnosis not present

## 2021-03-24 DIAGNOSIS — Z888 Allergy status to other drugs, medicaments and biological substances status: Secondary | ICD-10-CM

## 2021-03-24 DIAGNOSIS — F419 Anxiety disorder, unspecified: Secondary | ICD-10-CM | POA: Diagnosis present

## 2021-03-24 DIAGNOSIS — D421 Neoplasm of uncertain behavior of spinal meninges: Secondary | ICD-10-CM | POA: Diagnosis present

## 2021-03-24 DIAGNOSIS — N390 Urinary tract infection, site not specified: Secondary | ICD-10-CM | POA: Diagnosis not present

## 2021-03-24 DIAGNOSIS — Z8042 Family history of malignant neoplasm of prostate: Secondary | ICD-10-CM | POA: Diagnosis not present

## 2021-03-24 DIAGNOSIS — Z818 Family history of other mental and behavioral disorders: Secondary | ICD-10-CM | POA: Diagnosis not present

## 2021-03-24 DIAGNOSIS — T380X5A Adverse effect of glucocorticoids and synthetic analogues, initial encounter: Secondary | ICD-10-CM | POA: Diagnosis not present

## 2021-03-24 DIAGNOSIS — Z833 Family history of diabetes mellitus: Secondary | ICD-10-CM | POA: Diagnosis not present

## 2021-03-24 DIAGNOSIS — E669 Obesity, unspecified: Secondary | ICD-10-CM | POA: Diagnosis present

## 2021-03-24 DIAGNOSIS — R030 Elevated blood-pressure reading, without diagnosis of hypertension: Secondary | ICD-10-CM

## 2021-03-24 DIAGNOSIS — Z6841 Body Mass Index (BMI) 40.0 and over, adult: Secondary | ICD-10-CM

## 2021-03-24 DIAGNOSIS — C729 Malignant neoplasm of central nervous system, unspecified: Secondary | ICD-10-CM

## 2021-03-24 DIAGNOSIS — F988 Other specified behavioral and emotional disorders with onset usually occurring in childhood and adolescence: Secondary | ICD-10-CM | POA: Diagnosis present

## 2021-03-24 DIAGNOSIS — R202 Paresthesia of skin: Secondary | ICD-10-CM | POA: Diagnosis present

## 2021-03-24 DIAGNOSIS — Z825 Family history of asthma and other chronic lower respiratory diseases: Secondary | ICD-10-CM | POA: Diagnosis not present

## 2021-03-24 DIAGNOSIS — Z91040 Latex allergy status: Secondary | ICD-10-CM | POA: Diagnosis not present

## 2021-03-24 DIAGNOSIS — R531 Weakness: Secondary | ICD-10-CM | POA: Diagnosis present

## 2021-03-24 DIAGNOSIS — R000 Tachycardia, unspecified: Secondary | ICD-10-CM

## 2021-03-24 DIAGNOSIS — Z91012 Allergy to eggs: Secondary | ICD-10-CM

## 2021-03-24 DIAGNOSIS — M5416 Radiculopathy, lumbar region: Secondary | ICD-10-CM | POA: Diagnosis not present

## 2021-03-24 DIAGNOSIS — K5900 Constipation, unspecified: Secondary | ICD-10-CM | POA: Diagnosis not present

## 2021-03-24 DIAGNOSIS — N3289 Other specified disorders of bladder: Secondary | ICD-10-CM | POA: Diagnosis not present

## 2021-03-24 DIAGNOSIS — M62838 Other muscle spasm: Secondary | ICD-10-CM | POA: Diagnosis not present

## 2021-03-24 DIAGNOSIS — C720 Malignant neoplasm of spinal cord: Secondary | ICD-10-CM | POA: Diagnosis not present

## 2021-03-24 LAB — SURGICAL PATHOLOGY

## 2021-03-24 MED ORDER — BISACODYL 10 MG RE SUPP: 10.0000 mg | Freq: Every day | RECTAL | Status: DC | PRN

## 2021-03-24 MED ORDER — POLYETHYLENE GLYCOL 3350 17 G PO PACK: 17.0000 g | PACK | Freq: Every day | ORAL | Status: DC | PRN

## 2021-03-24 MED ORDER — METHYLPREDNISOLONE 4 MG PO TBPK: 4.0000 mg | ORAL_TABLET | Freq: Four times a day (QID) | ORAL | Status: DC

## 2021-03-24 MED ORDER — PROCHLORPERAZINE EDISYLATE 10 MG/2ML IJ SOLN: 5.0000 mg | Freq: Four times a day (QID) | INTRAMUSCULAR | Status: DC | PRN

## 2021-03-24 MED ORDER — ALUM & MAG HYDROXIDE-SIMETH 200-200-20 MG/5ML PO SUSP: 30.0000 mL | ORAL | Status: DC | PRN

## 2021-03-24 MED ORDER — PANTOPRAZOLE SODIUM 40 MG PO TBEC
40.0000 mg | DELAYED_RELEASE_TABLET | Freq: Every day | ORAL | Status: DC
  Administered 2021-03-24 – 2021-04-05 (×13): 40 mg via ORAL
  Filled 2021-03-24 (×13): qty 1

## 2021-03-24 MED ORDER — LISDEXAMFETAMINE DIMESYLATE 70 MG PO CAPS
70.0000 mg | ORAL_CAPSULE | Freq: Every morning | ORAL | Status: DC
  Administered 2021-03-25 – 2021-03-27 (×3): 70 mg via ORAL
  Filled 2021-03-24 (×3): qty 1

## 2021-03-24 MED ORDER — IBUPROFEN 200 MG PO TABS
800.0000 mg | ORAL_TABLET | Freq: Three times a day (TID) | ORAL | Status: DC | PRN
  Administered 2021-03-29 – 2021-04-03 (×7): 800 mg via ORAL
  Filled 2021-03-24 (×7): qty 4

## 2021-03-24 MED ORDER — DULOXETINE HCL 30 MG PO CPEP
60.0000 mg | ORAL_CAPSULE | Freq: Every day | ORAL | Status: DC
  Administered 2021-03-25 – 2021-03-27 (×3): 60 mg via ORAL
  Filled 2021-03-24 (×3): qty 2

## 2021-03-24 MED ORDER — PROCHLORPERAZINE 25 MG RE SUPP: 12.5000 mg | Freq: Four times a day (QID) | RECTAL | Status: DC | PRN

## 2021-03-24 MED ORDER — PROCHLORPERAZINE MALEATE 5 MG PO TABS: 5.0000 mg | ORAL_TABLET | Freq: Four times a day (QID) | ORAL | Status: DC | PRN

## 2021-03-24 MED ORDER — MELATONIN 5 MG PO TABS
10.0000 mg | ORAL_TABLET | Freq: Every day | ORAL | Status: DC
  Administered 2021-03-24 – 2021-04-05 (×13): 10 mg via ORAL
  Filled 2021-03-24 (×13): qty 2

## 2021-03-24 MED ORDER — GABAPENTIN 600 MG PO TABS
300.0000 mg | ORAL_TABLET | Freq: Three times a day (TID) | ORAL | Status: DC
  Filled 2021-03-24 (×2): qty 0.5

## 2021-03-24 MED ORDER — GUAIFENESIN-DM 100-10 MG/5ML PO SYRP: 5.0000 mL | ORAL_SOLUTION | Freq: Four times a day (QID) | ORAL | Status: DC | PRN

## 2021-03-24 MED ORDER — ACETAMINOPHEN 325 MG PO TABS
325.0000 mg | ORAL_TABLET | ORAL | Status: DC | PRN
  Administered 2021-03-24: 325 mg via ORAL
  Filled 2021-03-24: qty 2

## 2021-03-24 MED ORDER — HEPARIN SODIUM (PORCINE) 5000 UNIT/ML IJ SOLN
5000.0000 [IU] | Freq: Three times a day (TID) | INTRAMUSCULAR | Status: DC
  Administered 2021-03-24 – 2021-03-27 (×8): 5000 [IU] via SUBCUTANEOUS
  Filled 2021-03-24 (×8): qty 1

## 2021-03-24 MED ORDER — FLEET ENEMA 7-19 GM/118ML RE ENEM: 1.0000 | ENEMA | Freq: Once | RECTAL | Status: DC | PRN

## 2021-03-24 MED ORDER — TRAMADOL HCL 50 MG PO TABS
50.0000 mg | ORAL_TABLET | Freq: Four times a day (QID) | ORAL | Status: DC | PRN
  Administered 2021-03-25 – 2021-03-27 (×2): 50 mg via ORAL
  Filled 2021-03-24 (×2): qty 1

## 2021-03-24 MED ORDER — GABAPENTIN 300 MG PO CAPS
300.0000 mg | ORAL_CAPSULE | Freq: Three times a day (TID) | ORAL | Status: DC
  Administered 2021-03-24 – 2021-04-06 (×39): 300 mg via ORAL
  Filled 2021-03-24 (×39): qty 1

## 2021-03-24 MED ORDER — METHYLPREDNISOLONE 4 MG PO TABS
4.0000 mg | ORAL_TABLET | Freq: Four times a day (QID) | ORAL | Status: DC
  Administered 2021-03-24 – 2021-03-25 (×4): 4 mg via ORAL
  Filled 2021-03-24 (×6): qty 1

## 2021-03-24 MED ORDER — TRAZODONE HCL 50 MG PO TABS: 25.0000 mg | ORAL_TABLET | Freq: Every evening | ORAL | Status: DC | PRN

## 2021-03-24 MED ORDER — CYCLOBENZAPRINE HCL 10 MG PO TABS
10.0000 mg | ORAL_TABLET | Freq: Every evening | ORAL | Status: DC | PRN
  Administered 2021-03-24 – 2021-03-26 (×3): 10 mg via ORAL
  Filled 2021-03-24 (×4): qty 1

## 2021-03-24 MED ORDER — DIPHENHYDRAMINE HCL 12.5 MG/5ML PO ELIX: 12.5000 mg | ORAL_SOLUTION | Freq: Four times a day (QID) | ORAL | Status: DC | PRN

## 2021-03-24 MED ORDER — OXYCODONE HCL 5 MG PO TABS
10.0000 mg | ORAL_TABLET | ORAL | Status: DC | PRN
  Administered 2021-03-24 – 2021-03-28 (×13): 10 mg via ORAL
  Filled 2021-03-24 (×15): qty 2

## 2021-03-24 MED ORDER — LIDOCAINE HCL URETHRAL/MUCOSAL 2 % EX GEL: CUTANEOUS | Status: DC | PRN

## 2021-03-24 MED ORDER — HYDRALAZINE HCL 10 MG PO TABS: 10.0000 mg | ORAL_TABLET | Freq: Three times a day (TID) | ORAL | Status: DC | PRN

## 2021-03-24 MED ORDER — BACLOFEN 5 MG HALF TABLET
5.0000 mg | ORAL_TABLET | Freq: Three times a day (TID) | ORAL | Status: DC | PRN
  Administered 2021-03-25 – 2021-03-27 (×5): 5 mg via ORAL
  Filled 2021-03-24 (×7): qty 1

## 2021-03-24 NOTE — Progress Notes (Signed)
Pt discharged to CIR. Pt and all belongings moved to [id number]. Report previously called to receiving RN.  Justice Rocher, RN

## 2021-03-24 NOTE — Discharge Summary (Signed)
Physician Discharge Summary  Patient ID: Shannon [name] MRN: [id number] DOB/AGE: May 15, 1981 40 y.o.  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 The patient tolerated the procedure well and was taken to the recovery room and then to the floor in stable condition. The hospital course was routine. There were no complications. The wound remained clean dry and intact. Pt had appropriate back soreness. No complaints of leg pain or new N/T/W. The patient remained afebrile with stable vital signs, and tolerated a regular diet. The patient continued to increase activities, and pain was well controlled with oral pain medications.   Consults: None  Significant Diagnostic Studies:  Results for orders placed or performed during the hospital encounter of 03/17/21  Glucose, capillary  Result Value Ref Range   Glucose-Capillary 105 (H) 70 - 99 mg/dL  Pregnancy, urine POC  Result Value Ref Range   Preg Test, Ur NEGATIVE NEGATIVE  Surgical pathology  Result Value Ref Range   SURGICAL PATHOLOGY      SURGICAL PATHOLOGY CASE: [id number] PATIENT: Shannon [name] Surgical Pathology Report     Clinical History: intradural mass (cm)     FINAL MICROSCOPIC DIAGNOSIS:  A. FILUM INTRADURAL MASS, RESECTION: - Paraganglioma, see comment.  COMMENT:  There is a well circumscribed mass with nests of neuroendocrine appearing cells and abundant vessels. Immunohistochemistry is positive for CD56, synaptophysin, and chromogranin. S100 highlights scattered sustentacular cells. GFPA, EMA, pancytokeratin, and inhibin  are negative. CD31 and CD34 highlight vessels. Dr. Saralyn Pilar has reviewed the case.  INTRAOPERATIVE DIAGNOSIS:  A1. FILUM INTRADURAL MASS, FROZEN SECTION:        Neoplasm, favor ependymoma.        Rapid intraoperative consult diagnosis rendered by Dr. Saralyn Pilar @ 1215 03/17/2021.   GROSS DESCRIPTION:  Received fresh for rapid intraoperative consult evaluation by frozen section is a 1.1 x 1 x 1 cm well-defined somewhat firm nodule which has pink-white to re d-purple smooth surface and similar solid cut surfaces. A representative section is submitted in Block 1 for frozen section, with remaining specimen submitted in Block 2 for routine histology.  SW 03/17/2021   Final Diagnosis performed by Vicente Males, MD.   Electronically signed 03/24/2021 Technical component performed at [facility]. [facility], [place] [address], [place], [place] [address].  Professional component performed at [facility], [place] [address], [place], [place] [address].  Immunohistochemistry Technical component (if applicable) was performed at [facility]. [address] [place], [place], [place] [address].   IMMUNOHISTOCHEMISTRY DISCLAIMER (if applicable): Some of these immunohistochemical stains may have been developed and the performance characteristics determine by [facility]. Some may not have been cleared or approved by the U.S. Food and Drug Administration. The FDA has determined that such cl earance or approval is not necessary. This test is used for clinical purposes. It should not be regarded as investigational or for research. This laboratory is certified under the [place] (CLIA-88) as qualified to perform high complexity clinical laboratory testing.  The controls stained appropriately.     DG Lumbar Spine 2-3 Views  Result Date: 03/17/2021 CLINICAL DATA:  Intraop localization for  laminectomy at L4-L5.  EXAM: LUMBAR SPINE - 2-3 VIEW COMPARISON:  None. FINDINGS: Two portable cross-table lateral views of the lumbar spine obtained in the operating room. Image labeled 1 demonstrates surgical instrument in the soft tissues posterior to L4. Image labeled 2 demonstrates surgical instruments localizing posterior to L4. IMPRESSION: Surgical instruments localizing posterior to L4. Electronically Signed   By: Keith Rake M.D.   On: 03/17/2021 15:06   MR Lumbar Spine W Wo Contrast  Result Date: 02/27/2021 CLINICAL DATA:  40 year old female with low back pain radiating down both legs without improvement. EXAM: MRI LUMBAR SPINE WITHOUT AND WITH CONTRAST TECHNIQUE: Multiplanar and multiecho pulse sequences of the lumbar spine were obtained without and with intravenous contrast. CONTRAST:  15mL GADAVIST GADOBUTROL 1 MMOL/ML IV SOLN COMPARISON:  Lumbar MRI 03/20/2005. FINDINGS: Segmentation: Lumbar segmentation appears to be normal and will be designated as such for this report. Alignment: Straightening of lumbar lordosis is stable since 2006. No spondylolisthesis. Vertebrae: No marrow edema or evidence of acute osseous abnormality. Visualized bone marrow signal is within normal limits. Benign vertebral body hemangioma at L2 on the right. Intact visible sacrum and SI joints. Conus medullaris and cauda equina: Conus extends to the L2 level. No lower spinal cord or conus signal abnormality. From the conus to the mid L4 vertebral level the cauda equina nerve roots appear normal. Centered just below the L4-L5 disc a smooth round 11 mm homogeneously enhancing soft tissue mass occupies most of the spinal canal (series 10, image 9 and series 11, image 22) and is new since 2006. Axial postcontrast images raise the possibility of a dural based mass, but on axial T2 images (series 7, images 22 and 23) the lesion appears to be intradural, with nerve roots displaced laterally on both sides. No other abnormal intradural enhancement. No  dural thickening identified. Below the lesion the cauda equina nerve roots again appear normal. Paraspinal and other soft tissues: Heterogeneous STIR hyperintensity and enhancement at the right pelvic inlet (series 10, image 10) is probably the right adnexa. Nearby uterine fundus with evidence of a small fundal fibroid (series 6, image 10). Visualized abdominal viscera and paraspinal soft tissues are within normal limits. Disc levels: T11-T12: Negative. T12-L1:  Negative. L1-L2:  Negative. L2-L3:  Negative. L3-L4: Mild far lateral disc bulging. Mild facet hypertrophy. No stenosis. L4-L5: Minor circumferential disc bulge eccentric to the left. Mild facet and ligament flavum hypertrophy. Spinal stenosis primarily due to the enhancing soft tissue mass. No foraminal stenosis. L5-S1:  Mild facet hypertrophy, otherwise negative. IMPRESSION: 1. 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 Recommend follow-up with Neurosurgery. 2. Otherwise largely unremarkable for age MRI appearance of the lumbar spine. Electronically Signed   By: Genevie Ann M.D.   On: 02/27/2021 09:20    Antibiotics:  Anti-infectives (From admission, onward)   Start     Dose/Rate Route Frequency Ordered Stop   03/17/21 2200  ceFAZolin (ANCEF) IVPB 2g/100 mL premix  Status:  Discontinued        2 g 200 mL/hr over 30 Minutes Intravenous Every 8 hours 03/17/21 2110 03/17/21 2113   03/17/21 2200  vancomycin (VANCOREADY) IVPB 1250 mg/250 mL        1,250 mg 166.7 mL/hr over 90 Minutes Intravenous Every 12 hours 03/17/21 2113 03/18/21 0956  03/17/21 1000  vancomycin (VANCOCIN) IVPB 1000 mg/200 mL premix        1,000 mg 200 mL/hr over 60 Minutes Intravenous On call to O.R. 03/17/21 0945 03/17/21 1056   03/17/21 0946  vancomycin (VANCOCIN) 1-5 GM/200ML-%  IVPB       Note to Pharmacy: Alphonsus Sias   : cabinet override      03/17/21 0946 03/17/21 0956      Discharge Exam: Blood pressure 129/88, pulse 88, temperature 97.8 F (36.6 C), temperature source Oral, resp. rate 18, last menstrual period 03/03/2021, SpO2 97 %, unknown if currently breastfeeding. Neurologic: Grossly normal Ambulating and voiding ewll, incisin cdi  Discharge Medications:   Allergies as of 03/24/2021      Reactions   Diclofenac Rash   Pineapple Shortness Of Breath   Ceftriaxone Sodium Hives   Diflucan [fluconazole] Hives   Raw rash on abdomen and she had sores and swelling in mouth    Eggs Or Egg-derived Products Nausea And Vomiting   Can eat cake made with eggs   Bactrim [sulfamethoxazole-trimethoprim] Hives   Latex Rash      Medication List    TAKE these medications   albuterol 108 (90 Base) MCG/ACT inhaler Commonly known as: VENTOLIN HFA Inhale 2 puffs into the lungs every 6 (six) hours as needed for wheezing or shortness of breath.   cyclobenzaprine 10 MG tablet Commonly known as: FLEXERIL Take 1 tablet (10 mg total) by mouth at bedtime. As needed for back pain   DULoxetine 60 MG capsule Commonly known as: CYMBALTA Take 1 capsule by mouth daily.   ibuprofen 200 MG tablet Commonly known as: ADVIL Take 800 mg by mouth every 6 (six) hours as needed.   ibuprofen 600 MG tablet Commonly known as: ADVIL Take 1 tablet (600 mg total) by mouth every 6 (six) hours as needed.   methocarbamol 500 MG tablet Commonly known as: Robaxin Take 1 tablet (500 mg total) by mouth 4 (four) times daily.   ondansetron 8 MG disintegrating tablet Commonly known as: Zofran ODT 1/2- 1 tablet q 8 hr prn nausea, vomiting   OVER THE COUNTER MEDICATION Take 1 tablet by mouth daily at 12 noon. Nervive nerve pain relief   OVER THE COUNTER MEDICATION Take 2 tablets by mouth daily at 12 noon. Magnilife leg and back pain.   oxyCODONE-acetaminophen 5-325 MG  tablet Commonly known as: PERCOCET/ROXICET Take 1-2 tablets by mouth every 6 (six) hours as needed for severe pain. What changed: Another medication with the same name was added. Make sure you understand how and when to take each.   oxyCODONE-acetaminophen 5-325 MG tablet Commonly known as: Percocet Take 1 tablet by mouth every 4 (four) hours as needed for severe pain. What changed: You were already taking a medication with the same name, and this prescription was added. Make sure you understand how and when to take each.   predniSONE 20 MG tablet Commonly known as: DELTASONE 3 po once a day for 2 days, then 2 po once a day for 3 days, then 1 po once a day for 3 days   Vyvanse 70 MG capsule Generic drug: lisdexamfetamine Take 70 mg by mouth every morning.       Disposition: CIR   Final Dx: Decompressive lumbar laminectomy L4 and L5 with complete laminectomy of L4 partial laminectomy of L5 partial medial facetectomies for resection of intradural tumor at L4-5   Discharge Instructions    Diet - low sodium heart healthy  Complete by: As directed    Increase activity slowly   Complete by: As directed    No wound care   Complete by: As directed          Signed: Ocie Cornfield Clebert Wenger 03/24/2021, 11:34 AM

## 2021-03-24 NOTE — H&P (Addendum)
Physical Medicine and Rehabilitation Admission H&P    CC: Bilateral lower extremity weakness  HPI: Shannon [name] is a 40 year old female with history of ADD, preeclampsia, depression, obesity, back pain with numbness and tingling BLE/feet, spasms with involuntary spasms, falls and bladder incontinence with progressive symptoms for past 2 months.  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  History taken from chart review and patient.  She was admitted on 03/17/2021 for decompressive L4 and L5 laminectomy for tumor excision with Dr. Saintclair Halsted.  [facility] course complicated by lower extremity spasms, tachycardia as well as dysesthesias.  She was limited by pain and Medrol Dosepak was started on 03/21/2021 2 and on 03/25/2021.  Therapy ongoing with some improvement in total body spasms--still worse at nights, ongoing RLE weakness, sensory deficits and weakness. CIR recommended due to functional decline.  Please see preadmission assessment from earlier today as well.   Review of Systems  Constitutional: Negative for chills and fever.  HENT: Negative for hearing loss and tinnitus.   Eyes: Negative for blurred vision and double vision.  Respiratory: Negative for cough and shortness of breath.   Cardiovascular: Negative for chest pain and palpitations.  Gastrointestinal: Negative for abdominal pain, constipation and heartburn.  Genitourinary: Positive for urgency.  Musculoskeletal: Positive for back pain, falls and myalgias.       Good and bad days  Neurological: Positive for speech change, focal weakness and weakness. Negative for dizziness and headaches.  Psychiatric/Behavioral: The patient is nervous/anxious and has insomnia (due to spasms. ).   All other systems reviewed and are negative.   Past Medical History:  Diagnosis Date  . ADD (attention deficit disorder)   . Anemia   . Anxiety   . Asthma   . Back pain   . Bilateral swelling of feet   . Chest pain   . Constipation    . Depression   . Hyperlipidemia   . Hypertension    Preeclampsia post delivery  . Irregular heart beat   . Joint pain   . Multiple food allergies   . Obesity   . Palpitations in pediatric patient   . PONV (postoperative nausea and vomiting)    PONV; Also emotional upon waking up  . Shortness of breath   . Vitamin D deficiency     Past Surgical History:  Procedure Laterality Date  . DILATATION & CURRETTAGE/HYSTEROSCOPY WITH RESECTOCOPE N/A 02/03/2014   Procedure: DILATATION & CURETTAGE/HYSTEROSCOPY ;  Surgeon: Delice Lesch, MD;  Location: [facility];  Service: Gynecology;  Laterality: N/A;  . DILATION AND CURETTAGE OF UTERUS    . LAMINECTOMY N/A 03/17/2021   Procedure: Laminectomy - Lumbar Four-Lumbar Five for Intradural mass;  Surgeon: Kary Kos, MD;  Location: [facility];  Service: Neurosurgery;  Laterality: N/A;  3C  . MYOMECTOMY N/A 02/03/2014   Procedure: MYOMECTOMY ;  Surgeon: Delice Lesch, MD;  Location: [facility];  Service: Gynecology;  Laterality: N/A;  . SALPINGECTOMY    . TONSILLECTOMY    . tonsils and adnoids removed    . TUBAL LIGATION N/A 08/19/2015   Procedure: POST PARTUM TUBAL LIGATION;  Surgeon: Everett Graff, MD;  Location: [facility];  Service: Gynecology;  Laterality: N/A;  . WISDOM TOOTH EXTRACTION      Family History  Problem Relation Age of Onset  . Hypertension Mother   . Diabetes Mother   . Mental illness Mother   . Hyperlipidemia Mother   . Stroke Mother   . Depression Mother   .  Anxiety disorder Mother   . Bipolar disorder Mother   . Liver disease Mother   . Sleep apnea Mother   . Obesity Mother   . Hypertension Father   . Prostate cancer Father   . Asthma Brother   . Heart disease Maternal Grandmother   . Hypertension Maternal Grandmother   . Cancer Paternal Grandmother        breast cancer  . Seizures Brother     Social History:  Married. Was working as Management consultant till a month ago. She  reports that she has never smoked. She has  never used smokeless tobacco. She reports that she does not drink alcohol and does not use drugs.   Allergies  Allergen Reactions  . Diclofenac Rash  . Pineapple Shortness Of Breath  . Ceftriaxone Sodium Hives  . Diflucan [Fluconazole] Hives    Raw rash on abdomen and she had sores and swelling in mouth   . Eggs Or Egg-Derived Products Nausea And Vomiting    Can eat cake made with eggs  . Bactrim [Sulfamethoxazole-Trimethoprim] Hives  . Latex Rash    Medications Prior to Admission  Medication Sig Dispense Refill  . DULoxetine (CYMBALTA) 60 MG capsule Take 1 capsule by mouth daily.   1  . oxyCODONE-acetaminophen (PERCOCET/ROXICET) 5-325 MG tablet Take 1-2 tablets by mouth every 6 (six) hours as needed for severe pain. 20 tablet 0  . predniSONE (DELTASONE) 20 MG tablet 3 po once a day for 2 days, then 2 po once a day for 3 days, then 1 po once a day for 3 days 15 tablet 0  . VYVANSE 70 MG capsule Take 70 mg by mouth every morning.    Marland Kitchen albuterol (PROVENTIL HFA;VENTOLIN HFA) 108 (90 Base) MCG/ACT inhaler Inhale 2 puffs into the lungs every 6 (six) hours as needed for wheezing or shortness of breath. 1 Inhaler 3  . cyclobenzaprine (FLEXERIL) 10 MG tablet Take 1 tablet (10 mg total) by mouth at bedtime. As needed for back pain 20 tablet 0  . ibuprofen (ADVIL) 200 MG tablet Take 800 mg by mouth every 6 (six) hours as needed.    Marland Kitchen ibuprofen (ADVIL) 600 MG tablet Take 1 tablet (600 mg total) by mouth every 6 (six) hours as needed. (Patient not taking: No sig reported) 30 tablet 0  . ondansetron (ZOFRAN ODT) 8 MG disintegrating tablet 1/2- 1 tablet q 8 hr prn nausea, vomiting (Patient not taking: No sig reported) 20 tablet 0  . OVER THE COUNTER MEDICATION Take 1 tablet by mouth daily at 12 noon. Nervive nerve pain relief    . OVER THE COUNTER MEDICATION Take 2 tablets by mouth daily at 12 noon. Magnilife leg and back pain.      Drug Regimen Review  Drug regimen was reviewed and remains  appropriate with no significant issues identified  Home: Home Living Family/patient expects to be discharged to:: Private residence Living Arrangements: Spouse/significant other,Children Available Help at Discharge: Family,Available 24 hours/day Type of Home: House Home Access: Stairs to enter [employer] of Steps: 3 Home Layout: Two level,Able to live on main level with bedroom/bathroom,1/2 bath on main level Alternate Level Stairs-Number of Steps: full flight Bathroom Shower/Tub: Chiropodist: Standard Bathroom Accessibility: Yes Home Equipment: None Additional Comments: Pt's spouse is moving bedroom furniture downstairs so pt can stay on main level.  Has 1/2 bath on first floor.  She has 5 and 104 y.o. children, and has a 40 y.o. daughter who will  stay after discharge to assist her  Lives With: Spouse,Family   Functional History: Prior Function Level of Independence: Independent Comments: works as a Futures trader Status:  Mobility: Bed Mobility Overal bed mobility: Needs Assistance Bed Mobility: Rolling,Sidelying to Sit Rolling: Supervision Sidelying to sit: Min assist Sit to supine: Mod assist Sit to sidelying: Min assist General bed mobility comments: discussed log roll to side, pt with good adherence with verbal cues and use of rail Transfers Overall transfer level: Needs assistance Equipment used: Rolling walker (2 wheeled) Transfers: Sit to/from Stand Sit to Stand: Min assist Stand pivot transfers: Min assist,+2 physical assistance,+2 safety/equipment General transfer comment: minA with increased time to power up, increased pain with activity but no overt LOB Ambulation/Gait Ambulation/Gait assistance: Min assist Gait Distance (Feet): 15 Feet (+ 15 ft) Assistive device: Rolling walker (2 wheeled) Gait Pattern/deviations: Step-to pattern,Decreased stride length,Wide base of support,Decreased dorsiflexion -  right,Decreased weight shift to right General Gait Details: ot ambulating 15 ft to bathroom, then seated rest break, then 15 ft with RW and chair follow until pt fatigued. Pt with decreased wt acceptance to LLE and decreased clearance with each step bilaterally. Gait velocity: decreased Gait velocity interpretation: <1.31 ft/sec, indicative of household ambulator    ADL: ADL Overall ADL's : Needs assistance/impaired Eating/Feeding: Independent Grooming: Wash/dry face,Set up Grooming Details (indicate cue type and reason): feeling hot s/p session and washing face Upper Body Bathing: Minimal assistance,Sitting Lower Body Bathing: Maximal assistance,Sit to/from stand Upper Body Dressing : Minimal assistance,Sitting Lower Body Dressing: Supervision/safety,Sitting/lateral leans,Sit to/from stand Lower Body Dressing Details (indicate cue type and reason): sitting up in bed bringing BLEs in towards pt to don socks. Toilet Transfer: Minimal Publishing rights manager Details (indicate cue type and reason): simulated to recliner; chair follow for safety due to pain Toileting- Clothing Manipulation and Hygiene: Moderate assistance,Sit to/from stand Functional mobility during ADLs: Minimal assistance,+2 for safety/equipment,Rolling walker,Cueing for safety,Cueing for sequencing (+2 for chair follow and for stability) General ADL Comments: Pt limited by pain and decreased ability to care for self. Pt performing LB dressing in bed and reports having family at home to assist.  Cognition: Cognition Overall Cognitive Status: Within Functional Limits for tasks assessed Orientation Level: Oriented X4 Cognition Arousal/Alertness: Awake/alert Behavior During Therapy: WFL for tasks assessed/performed Overall Cognitive Status: Within Functional Limits for tasks assessed General Comments: pt able to follow all commands and cues, limited by pain, internally distracted   Blood pressure (!)  140/106, pulse (!) 106, temperature 98.3 F (36.8 C), resp. rate 15, last menstrual period 03/03/2021, SpO2 100 %, unknown if currently breastfeeding. Physical Exam Vitals and nursing note reviewed.  Constitutional:      General: She is not in acute distress.    Appearance: She is obese.  HENT:     Head: Normocephalic and atraumatic.     Right Ear: External ear normal.     Left Ear: External ear normal.     Nose: Nose normal.  Eyes:     General:        Right eye: No discharge.        Left eye: No discharge.     Extraocular Movements: Extraocular movements intact.  Cardiovascular:     Rate and Rhythm: Normal rate and regular rhythm.  Pulmonary:     Effort: Pulmonary effort is normal. No respiratory distress.     Breath sounds: No stridor.  Abdominal:     General: Abdomen is flat. Bowel sounds are normal.  There is no distension.  Musculoskeletal:        General: No swelling or tenderness.     Cervical back: Normal range of motion and neck supple.     Comments: No edema or tenderness in extremities  Skin:    General: Skin is warm and dry.     Comments: Back incision intact with steri strips in place.   Neurological:     Mental Status: She is alert.     Comments: Alert and oriented Motor: Bilateral upper extremities: 5/5 proximal distal Bilateral lower extremities: Flexion, knee extension 4 -/5, ankle dorsiflexion 5/5 Sensation intact light touch  Psychiatric:        Mood and Affect: Mood normal.        Behavior: Behavior normal.        Thought Content: Thought content normal.    No results found for this or any previous visit (from the past 48 hour(s)). No results found.   Medical Problem List and Plan: 1.  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    -patient may not shower  -ELOS/Goals: 10-14 days/supervision/mod I  Admit to CIR 2.  Antithrombotics: -DVT/anticoagulation:  Pharmaceutical: Heparin  -antiplatelet  therapy: N/A 3. Pain Management: Oxycodone prn  --On Gabapentin tid.   --will add baclofen tid prn. Continue flexeril at bedtime.   Monitor with increased exertion 4. Mood: LCSW to follow for evaluation and support  -antipsychotic agents: N/AA 5. Neuropsych: This patient is capable of making decisions on her own behalf. 6. Skin/Wound Care: Monitor incision for healing.  -- Routine pressure-relief measures. 7. Fluids/Electrolytes/Nutrition: Monitor intake/output.  CMP ordered 8.  Elevated blood pressure: Trending upwards question due to methylprednisolone and/or pain.  -- Monitor 3 times daily.  Hydralazine prn for 24 hours.   Monitor with increased exertion 9. Neurogenic bladder: Incontinence has resolved but now with urgency/feeling of not emptying fully  PVRs ordered 10. Anxiety/ADD/ADHD/Depression: has been under lot of stress.   --Continue Vyvanse and Cymbalta  Team support  Bary Leriche, PA-C 03/24/2021  I have personally performed a face to face diagnostic evaluation, including, but not limited to relevant history and physical exam findings, of this patient and developed relevant assessment and plan.  Additionally, I have reviewed and concur with the physician assistant's documentation above.  Delice Lesch, MD, ABPMR  The patient's status has not changed. Any changes from the pre-admission screening or documentation from the acute chart are noted above.   Delice Lesch, MD, ABPMR

## 2021-03-24 NOTE — Progress Notes (Signed)
OT Cancellation Note  Patient Details Name: Shannon [name] MRN: [id number] DOB: 11-27-1980   Cancelled Treatment:    Reason Eval/Treat Not Completed: Other (comment);Patient declined, no reason specified pt reports wanting to take her Vyvanse prior to OT session, RN waiting on meds to be delivered from pharmacy, will check back as time allows for OT session once medicine has been delivered.   Harley [facility] COTA/L Acute Rehabilitation Services [phone]   Precious Haws 03/24/2021, 9:42 AM

## 2021-03-24 NOTE — Progress Notes (Signed)
Inpatient Rehabilitation Admissions Coordinator  I have CIR bed available to admit her today. I met at bedside with patient and she is in agreement. I have contacted Margo Aye NP , acute team and TOC to make arrangements to admit.  Danne Baxter, RN, MSN Rehab Admissions Coordinator [phone] 03/24/2021 11:08 AM

## 2021-03-24 NOTE — Progress Notes (Signed)
INPATIENT REHABILITATION ADMISSION NOTE   Arrival Method: bed     Mental Orientation: A&O x4   Assessment: done   Skin: incision on mid back with steristrips   IV'S: right arm   Pain: none   Tubes and Drains: done   Safety Measures: done    Vital Signs: done   Height and Weight: done   Rehab Orientation: done   Family: notified by patient of rehab admission    Notes: done

## 2021-03-24 NOTE — Progress Notes (Signed)
Meredith Staggers, MD  Physician  Physical Medicine and Rehabilitation  Consult Note     Signed  Date of Service:  03/21/2021 11:34 AM      Related encounter: Admission (Current) from 03/17/2021 in [facility] All Collapse All     Show:Clear all [x] Manual[x] Template[] Copied  Added by: [x] Love, Ivan Anchors, PA-C[x] Meredith Staggers, MD   [] Hover for details           Physical Medicine and Rehabilitation Consult   Reason for Consult: Functional deficits Referring Physician: Dr. Saintclair Halsted     HPI: Shannon [name] is a 40 y.o. 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 She was admitted on 03/17/21 for decompressive L4 and L5 laminectomy with tumor excision by Dr. Saintclair Halsted. Post op with tachycardia, dysesthesias and LE muscle spasms. Therapy evaluations completed revealing BLE weakness with knee. fatiugeinstability and balance deficits affecting ADLs and mobility. CIR was recommended due to functional decline.      Review of Systems  Constitutional: Negative for chills and fever.  HENT: Negative for ear pain and tinnitus.   Eyes: Negative for pain.  Respiratory: Negative for cough and hemoptysis.   Cardiovascular: Negative for chest pain and palpitations.  Gastrointestinal: Negative for nausea and vomiting.  Musculoskeletal: Positive for back pain and myalgias.  Skin: Negative for rash.  Neurological: Positive for sensory change, focal weakness and weakness. Negative for dizziness.  Psychiatric/Behavioral: Negative for suicidal ideas.            Past Medical History:  Diagnosis Date  . ADD (attention deficit disorder)    . Anemia    . Anxiety    . Asthma    . Back pain    . Bilateral swelling of feet    . Chest pain    . Constipation    . Depression    . Hyperlipidemia    .  Hypertension      Preeclampsia post delivery  . Irregular heart beat    . Joint pain    . Multiple food allergies    . Obesity    . Palpitations in pediatric patient    . PONV (postoperative nausea and vomiting)      PONV; Also emotional upon waking up  . Shortness of breath    . Vitamin D deficiency             Past Surgical History:  Procedure Laterality Date  . DILATATION & CURRETTAGE/HYSTEROSCOPY WITH RESECTOCOPE N/A 02/03/2014    Procedure: DILATATION & CURETTAGE/HYSTEROSCOPY ;  Surgeon: Delice Lesch, MD;  Location: [facility];  Service: Gynecology;  Laterality: N/A;  . DILATION AND CURETTAGE OF UTERUS      . LAMINECTOMY N/A 03/17/2021    Procedure: Laminectomy - Lumbar Four-Lumbar Five for Intradural mass;  Surgeon: Kary Kos, MD;  Location: [facility];  Service: Neurosurgery;  Laterality: N/A;  3C  . MYOMECTOMY N/A 02/03/2014    Procedure: MYOMECTOMY ;  Surgeon: Delice Lesch, MD;  Location: [facility];  Service: Gynecology;  Laterality: N/A;  . SALPINGECTOMY      . TONSILLECTOMY      . tonsils and adnoids removed      . TUBAL LIGATION N/A 08/19/2015    Procedure: POST PARTUM TUBAL LIGATION;  Surgeon: Everett Graff, MD;  Location: [facility];  Service: Gynecology;  Laterality: N/A;  . WISDOM TOOTH EXTRACTION               Family History  Problem Relation Age of Onset  . Hypertension Mother    . Diabetes Mother    . Mental illness Mother    . Hyperlipidemia Mother    . Stroke Mother    . Depression Mother    . Anxiety disorder Mother    . Bipolar disorder Mother    . Liver disease Mother    . Sleep apnea Mother    . Obesity Mother    . Hypertension Father    . Prostate cancer Father    . Asthma Brother    . Heart disease Maternal Grandmother    . Hypertension Maternal Grandmother    . Cancer Paternal Grandmother          breast cancer  . Seizures Brother        Social History:  reports that she has never smoked. She has never used smokeless tobacco. She reports that  she does not drink alcohol and does not use drugs.           Allergies  Allergen Reactions  . Diclofenac Rash  . Pineapple Shortness Of Breath  . Ceftriaxone Sodium Hives  . Diflucan [Fluconazole] Hives      Raw rash on abdomen and she had sores and swelling in mouth   . Eggs Or Egg-Derived Products Nausea And Vomiting      Can eat cake made with eggs  . Bactrim [Sulfamethoxazole-Trimethoprim] Hives  . Latex Rash          Medications Prior to Admission  Medication Sig Dispense Refill  . DULoxetine (CYMBALTA) 60 MG capsule Take 1 capsule by mouth daily.    1  . oxyCODONE-acetaminophen (PERCOCET/ROXICET) 5-325 MG tablet Take 1-2 tablets by mouth every 6 (six) hours as needed for severe pain. 20 tablet 0  . predniSONE (DELTASONE) 20 MG tablet 3 po once a day for 2 days, then 2 po once a day for 3 days, then 1 po once a day for 3 days 15 tablet 0  . VYVANSE 70 MG capsule Take 70 mg by mouth every morning.      Marland Kitchen albuterol (PROVENTIL HFA;VENTOLIN HFA) 108 (90 Base) MCG/ACT inhaler Inhale 2 puffs into the lungs every 6 (six) hours as needed for wheezing or shortness of breath. 1 Inhaler 3  . cyclobenzaprine (FLEXERIL) 10 MG tablet Take 1 tablet (10 mg total) by mouth at bedtime. As needed for back pain 20 tablet 0  . ibuprofen (ADVIL) 200 MG tablet Take 800 mg by mouth every 6 (six) hours as needed.      Marland Kitchen ibuprofen (ADVIL) 600 MG tablet Take 1 tablet (600 mg total) by mouth every 6 (six) hours as needed. (Patient not taking: No sig reported) 30 tablet 0  . ondansetron (ZOFRAN ODT) 8 MG disintegrating tablet 1/2- 1 tablet q 8 hr prn nausea, vomiting (Patient not taking: No sig reported) 20 tablet 0  . OVER THE COUNTER MEDICATION Take 1 tablet by mouth daily at 12 noon. Nervive nerve pain relief      . OVER THE COUNTER MEDICATION Take 2 tablets by mouth daily at 12 noon. Magnilife leg and back pain.          Home: Home Living Family/patient expects to be discharged to:: Private  residence Living Arrangements: Spouse/significant other,Children Available Help at Discharge: Family,Available 24 hours/day Type  of Home: House Home Access: Stairs to enter [employer] of Steps: 3 Home Layout: Two level,Able to live on main level with bedroom/bathroom,1/2 bath on main level Alternate Level Stairs-Number of Steps: full flight Bathroom Toilet: Standard Home Equipment: None Additional Comments: Pt's spouse is moving bedroom furniture downstairs so pt can stay on main level.  Has 1/2 bath on first floor.  She has 5 and 99 y.o. children, and has a 78 y.o. daughter who will stay after discharge to assist her  Functional History: Prior Function Level of Independence: Independent Comments: works as a Astronomer Status:  Mobility: Bed Mobility Overal bed mobility: Needs Assistance Bed Mobility: Rolling,Sidelying to Sit Rolling: Supervision Sidelying to sit: Min assist General bed mobility comments: Cues for log roll technique and use of bed rail, light minA to execute sitting upright Transfers Overall transfer level: Needs assistance Equipment used: Rolling walker (2 wheeled) Transfers: Sit to/from [employer] to Stand: [employer] physical assistance Stand pivot transfers: Min assist,+2 physical assistance,+2 safety/equipment General transfer comment: MinA + 2 to rise to standing from edge of bed and toilet, cues for hand positioning, pt utilizing wide BOS Ambulation/Gait Ambulation/Gait assistance: Min assist,Mod assist,+2 physical assistance Gait Distance (Feet): 25 Feet Assistive device: Rolling walker (2 wheeled) Gait Pattern/deviations: Step-through pattern,Decreased stride length,Wide base of support General Gait Details: Pt requiring minA + 2 initially, but up to [place] + 2 provided with fatigue and bilateral knee instability in order to turn and assist onto toilet. Cues provided for walker use,  sequencing, breathing. Gait velocity: decreased Gait velocity interpretation: <1.31 ft/sec, indicative of household ambulator   ADL: ADL Overall ADL's : Needs assistance/impaired Eating/Feeding: Independent Grooming: Wash/dry hands,Wash/dry face,Oral care,Set up,Sitting Grooming Details (indicate cue type and reason): unable to tolerate in standing due to increased pain Upper Body Bathing: Minimal assistance,Sitting Lower Body Bathing: Maximal assistance,Sit to/from stand Upper Body Dressing : Minimal assistance,Sitting Lower Body Dressing: Total assistance,Sit to/from stand Toilet Transfer: Minimal assistance,+2 for physical assistance,+2 for safety/equipment,Ambulation,Comfort height toilet,Grab bars,RW Toileting- Clothing Manipulation and Hygiene: Moderate assistance,Sit to/from stand Functional mobility during ADLs: Minimal assistance,+2 for physical assistance,+2 for safety/equipment,Rolling walker General ADL Comments: Pt limited by pain   Cognition: Cognition Overall Cognitive Status: Within Functional Limits for tasks assessed Orientation Level: Oriented X4 Cognition Arousal/Alertness: Awake/alert Behavior During Therapy: WFL for tasks assessed/performed Overall Cognitive Status: Within Functional Limits for tasks assessed   Blood pressure 122/69, pulse 100, temperature 98.4 F (36.9 C), temperature source Oral, resp. rate 20, last menstrual period 03/03/2021, SpO2 97 %, unknown if currently breastfeeding. Physical Exam Constitutional:      General: She is not in acute distress.    Appearance: Normal appearance.  HENT:     Head: Normocephalic and atraumatic.     Right Ear: External ear normal.     Left Ear: External ear normal.     Mouth/Throat:     Mouth: Mucous membranes are moist.  Eyes:     Extraocular Movements: Extraocular movements intact.     Pupils: Pupils are equal, round, and reactive to light.  Cardiovascular:     Rate and Rhythm: Tachycardia present.   Pulmonary:     Effort: Pulmonary effort is normal.  Abdominal:     Palpations: Abdomen is soft.  Musculoskeletal:        General: No swelling.     Cervical back: Normal range of motion.     Comments: Low back and right hip tender with ROM and  simple palpation.   Skin:    General: Skin is warm.  Neurological:     Mental Status: She is alert and oriented to person, place, and time.     Cranial Nerves: No cranial nerve deficit.     Sensory: No sensory deficit.     Comments: Pt with 1+ DTR's. No focal motor weakness other than with right hip flexion,extension which was limited due to pain. Other than that all lumbar innervated muscles with normal strength.   Psychiatric:        Mood and Affect: Mood normal.        Lab Results Last 24 Hours  No results found for this or any previous visit (from the past 24 hour(s)).   Imaging Results (Last 48 hours)  No results found.       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 Does the need for close, 24 hr/day medical supervision in concert with the patient's rehab needs make it unreasonable for this patient to be served in a less intensive setting? Potentially 2. Co-Morbidities requiring supervision/potential complications: pain mgt, obesity, constipation, htn, tachycardia 3. Due to bladder management, bowel management, safety, skin/wound care, disease management, medication administration, pain management and patient education, does the patient require 24 hr/day rehab nursing? Yes 4. Does the patient require coordinated care of a physician, rehab nurse, therapy disciplines of PT, OT to address physical and functional deficits in the context of the above medical diagnosis(es)? Yes and Potentially Addressing deficits in the following areas: balance, endurance, locomotion, strength, transferring, bowel/bladder control, bathing, dressing,  feeding, grooming, toileting and psychosocial support 5. Can the patient actively participate in an intensive therapy program of at least 3 hrs of therapy per day at least 5 days per week? Yes 6. The potential for patient to make measurable gains while on inpatient rehab is excellent 7. Anticipated functional outcomes upon discharge from inpatient rehab are modified independent  with PT, modified independent with OT, n/a with SLP. 8. Estimated rehab length of stay to reach the above functional goals is: 6-9 days 9. Anticipated discharge destination: Home 10. Overall Rehab/Functional Prognosis: excellent   RECOMMENDATIONS: This patient's condition is appropriate for continued rehabilitative care in the following setting: CIR Patient has agreed to participate in recommended program. Yes Note that insurance prior authorization may be required for reimbursement for recommended care.   Comment: Pain still a big barrier for her. Gabapentin started today which may help RLE sx. Rehab Admissions Coordinator to follow up.   Thanks,   Meredith Staggers, MD, [facility] Drown   I have personally performed a face to face diagnostic evaluation of this patient. Additionally, I have examined pertinent labs and radiographic images. I have reviewed and concur with the physician assistant's documentation above.     Bary Leriche, PA-C 03/21/2021          Revision History                             Routing History              Note Details  Author Meredith Staggers, MD File Time 03/21/2021  4:56 PM  Author Type Physician Status Signed  Last Editor Meredith Staggers, MD Service Physical Medicine and Rehabilitation

## 2021-03-24 NOTE — Progress Notes (Signed)
Jamse Arn, MD  Physician  Physical Medicine and Rehabilitation  PMR Pre-admission     Addendum  Date of Service:  03/22/2021  3:37 PM      Related encounter: Admission (Current) from 03/17/2021 in [place]           Show:Clear all [x] Manual[x] Template[x] Copied  Added by: [x] Cristina Gong, RN[x] Jamse Arn, MD   [] Hover for details  PMR Admission Coordinator Pre-Admission Assessment   Patient: Shannon [name] is an 40 y.o., female MRN: [id number] DOB: January 08, 1981 Height:   Weight:                                                                                                                                                Insurance Information HMO:     PPO: yes     PCP:      IPA:      80/20:      OTHER:  PRIMARY: [place] [employer] [place]      Policy#: [id number]      Subscriber: pt CM Name:     Phone#: faxed approval      Fax#: [phone] Pre-Cert#: [id number] approved until 6/10      Employer:  Benefits:  Phone #: [phone]     Name: 5/31 Eff. Date: 10/22/2020     Deduct: $1250      Out of Pocket Max: $4890      Life Max: none  CIR: 80%      SNF: 80% 100 days per year Outpatient: $10 to $52 per visit     Co-Pay: visits per medical neccesity Home Health: 80%      Co-Pay: visits per medical neccesity DME: 80%     Co-Pay: 20% Providers: in network  SECONDARY: none   Financial Counselor:       Phone#:    The Engineer, petroleum" for patients in Inpatient Rehabilitation Facilities with attached "Privacy Act [place] Records" was provided and verbally reviewed with: N/A   Emergency Contact Information Contact Information       Name Relation Home Work Mobile    [place] Spouse [phone]   [phone]    Bin,Carolyn Relative     [phone]         Current Medical History  Patient Admitting Diagnosis: right lumbar radiculitis L4, 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 She was admitted on 03/17/21 for decompressive L4 and L5 laminectomy with tumor excision by Dr. Saintclair Halsted. Post op with tachycardia, dysesthesias and LE muscle spasms. Therapy evaluations completed revealing BLE weakness with knee. Fatigue, instability and balance deficits affecting ADLs and mobility .   Past Medical History  Past Medical History:  Diagnosis Date  . ADD (attention deficit disorder)    . Anemia    . Anxiety    . Asthma    . Back pain    . Bilateral swelling of feet    . Chest pain    . Constipation    . Depression    . Hyperlipidemia    . Hypertension      Preeclampsia post delivery  . Irregular heart beat    . Joint pain    . Multiple food allergies    . Obesity    . Palpitations in pediatric patient    . PONV (postoperative nausea and vomiting)      PONV; Also emotional upon waking up  . Shortness of breath    . Vitamin D deficiency      Family History  family history includes Anxiety disorder in her mother; Asthma in her brother; Bipolar disorder in her mother; Cancer in her paternal grandmother; Depression in her mother; Diabetes in her mother; Heart disease in her maternal grandmother; Hyperlipidemia in her mother; Hypertension in her father, maternal grandmother, and mother; Liver disease in her mother; Mental illness in her mother; Obesity in her mother; Prostate cancer in her father; Seizures in her brother; Sleep apnea in her mother; Stroke in her mother.   Prior Rehab/Hospitalizations:  Has the patient had prior rehab or hospitalizations prior to admission? Yes   Has the patient had major surgery during 100 days prior to admission? Yes   Current Medications    Current Facility-Administered Medications:  .  0.9 %  sodium chloride infusion, 250 mL,  Intravenous, Continuous, Kary Kos, MD, Stopped at 03/18/21 [phone] .  acetaminophen (TYLENOL) tablet 650 mg, 650 mg, Oral, Q4H PRN, 650 mg at 03/18/21 1932 **OR** acetaminophen (TYLENOL) suppository 650 mg, 650 mg, Rectal, Q4H PRN, Kary Kos, MD .  alum & mag hydroxide-simeth (MAALOX/MYLANTA) 200-200-20 MG/5ML suspension 30 mL, 30 mL, Oral, Q6H PRN, Kary Kos, MD .  cyclobenzaprine (FLEXERIL) tablet 10 mg, 10 mg, Oral, QHS, Kary Kos, MD, 10 mg at 03/23/21 2124 .  DULoxetine (CYMBALTA) DR capsule 60 mg, 60 mg, Oral, Daily, Kary Kos, MD, 60 mg at 03/24/21 4166 .  gabapentin (NEURONTIN) tablet 300 mg, 300 mg, Oral, TID, Kary Kos, MD, 300 mg at 03/24/21 0630 .  heparin injection 5,000 Units, 5,000 Units, Subcutaneous, Q8H, Ostergard, Joyice Faster, MD, 5,000 Units at 03/24/21 0524 .  hydrALAZINE (APRESOLINE) injection 10-20 mg, 10-20 mg, Intravenous, Q4H PRN, Meyran, Ocie Cornfield, NP, 10 mg at 03/23/21 1023 .  HYDROmorphone (DILAUDID) injection 0.5 mg, 0.5 mg, Intravenous, Q2H PRN, Kary Kos, MD, 0.5 mg at 03/22/21 2336 .  ibuprofen (ADVIL) tablet 800 mg, 800 mg, Oral, Q8H PRN, Kary Kos, MD, 800 mg at 03/23/21 1707 .  lisdexamfetamine (VYVANSE) capsule 70 mg, 70 mg, Oral, q morning, Kary Kos, MD, 70 mg at 03/24/21 1012 .  menthol-cetylpyridinium (CEPACOL) lozenge 3 mg, 1 lozenge, Oral, PRN **OR** phenol (CHLORASEPTIC) mouth spray 1 spray, 1 spray, Mouth/Throat, PRN, Kary Kos, MD .  methocarbamol (ROBAXIN) tablet 1,000 mg, 1,000 mg, Oral, Q8H PRN, Judith Part, MD, 1,000 mg at 03/23/21 2308 .  methylPREDNISolone (MEDROL DOSEPAK) tablet 4 mg, 4 mg, Oral, 4X daily taper, Kary Kos, MD, 4 mg at 03/24/21 0924 .  ondansetron (ZOFRAN) tablet 4 mg, 4 mg, Oral, Q6H PRN, 4 mg at 03/21/21 0433 **OR** ondansetron (ZOFRAN) injection 4 mg, 4 mg, Intravenous, Q6H PRN, Kary Kos, MD, 4 mg at  03/18/21 1838 .  oxyCODONE (Oxy IR/ROXICODONE) immediate release tablet 10 mg, 10 mg, Oral, Q3H PRN, Kary Kos, MD, 10 mg at 03/24/21 3299 .  oxyCODONE-acetaminophen (PERCOCET/ROXICET) 5-325 MG per tablet 1-2 tablet, 1-2 tablet, Oral, Q6H PRN, Kary Kos, MD, 2 tablet at 03/23/21 2308 .  pantoprazole (PROTONIX) EC tablet 40 mg, 40 mg, Oral, QHS, Kary Kos, MD, 40 mg at 03/23/21 2124 .  sodium chloride flush (NS) 0.9 % injection 3 mL, 3 mL, Intravenous, Q12H, Kary Kos, MD, 3 mL at 03/24/21 0923 .  sodium chloride flush (NS) 0.9 % injection 3 mL, 3 mL, Intravenous, PRN, Kary Kos, MD, 3 mL at 03/18/21 0059   Patients Current Diet:  Diet Order                  Diet Heart Room service appropriate? Yes; Fluid consistency: Thin  Diet effective now                         Precautions / Restrictions Precautions Precautions: Back,Fall Precaution Booklet Issued: Yes (comment) Precaution Comments: Verbally reviewed; pt able to  stae 2/3 with cues for "lift" Restrictions Weight Bearing Restrictions: No    Has the patient had 2 or more falls or a fall with injury in the past year?No   Prior Activity Level Community (5-7x/wk): independent, driving and working   Prior Functional Level Prior Function Level of Independence: Independent Comments: works as a Quarry manager Care: Did the patient need help bathing, dressing, using the toilet or eating?  Independent   Indoor Mobility: Did the patient need assistance with walking from room to room (with or without device)? Independent   Stairs: Did the patient need assistance with internal or external stairs (with or without device)? Independent   Functional Cognition: Did the patient need help planning regular tasks such as shopping or remembering to take medications? Independent   Home Assistive Devices / Equipment Home Equipment: None   Prior Device Use: Indicate devices/aids used by the patient prior to current illness, exacerbation or injury? None of the above   Current Functional Level Cognition   Overall  Cognitive Status: Within Functional Limits for tasks assessed Orientation Level: Oriented X4 General Comments: pt able to follow all commands and cues, limited by pain, internally distracted    Extremity Assessment (includes Sensation/Coordination)   Upper Extremity Assessment: Overall WFL for tasks assessed  Lower Extremity Assessment: Defer to PT evaluation RLE Deficits / Details: Grossly 4/5 LLE Deficits / Details: Grossly 4/5     ADLs   Overall ADL's : Needs assistance/impaired Eating/Feeding: Independent Grooming: Wash/dry face,Set up Grooming Details (indicate cue type and reason): feeling hot s/p session and washing face Upper Body Bathing: Minimal assistance,Sitting Lower Body Bathing: Maximal assistance,Sit to/from stand Upper Body Dressing : Minimal assistance,Sitting Lower Body Dressing: Supervision/safety,Sitting/lateral leans,Sit to/from stand Lower Body Dressing Details (indicate cue type and reason): sitting up in bed bringing BLEs in towards pt to don socks. Toilet Transfer: Minimal Publishing rights manager Details (indicate cue type and reason): simulated to recliner; chair follow for safety due to pain Toileting- Clothing Manipulation and Hygiene: Moderate assistance,Sit to/from stand Functional mobility during ADLs: Minimal assistance,+2 for safety/equipment,Rolling walker,Cueing for safety,Cueing for sequencing (+2 for chair follow and for stability) General ADL Comments: Pt limited by pain and decreased ability to care for self. Pt performing LB dressing in bed and reports having family at home to assist.  Mobility   Overal bed mobility: Needs Assistance Bed Mobility: Rolling,Sidelying to Sit Rolling: Supervision Sidelying to sit: Min assist Sit to supine: Mod assist Sit to sidelying: Min assist General bed mobility comments: discussed log roll to side, pt with good adherence with verbal cues and use of rail     Transfers   Overall transfer  level: Needs assistance Equipment used: Rolling walker (2 wheeled) Transfers: Sit to/from Stand Sit to Stand: Min assist Stand pivot transfers: Min assist,+2 physical assistance,+2 safety/equipment General transfer comment: minA with increased time to power up, increased pain with activity but no overt LOB     Ambulation / Gait / Stairs / Wheelchair Mobility   Ambulation/Gait Ambulation/Gait assistance: Herbalist (Feet): 15 Feet (+ 15 ft) Assistive device: Rolling walker (2 wheeled) Gait Pattern/deviations: Step-to pattern,Decreased stride length,Wide base of support,Decreased dorsiflexion - right,Decreased weight shift to right General Gait Details: ot ambulating 15 ft to bathroom, then seated rest break, then 15 ft with RW and chair follow until pt fatigued. Pt with decreased wt acceptance to LLE and decreased clearance with each step bilaterally. Gait velocity: decreased Gait velocity interpretation: <1.31 ft/sec, indicative of household ambulator     Posture / Balance Dynamic Sitting Balance Sitting balance - Comments: Reliant on at least single UE support due to pain Balance Overall balance assessment: Needs assistance Sitting-balance support: Feet supported,Single extremity supported Sitting balance-Leahy Scale: Poor Sitting balance - Comments: Reliant on at least single UE support due to pain Standing balance support: Bilateral upper extremity supported Standing balance-Leahy Scale: Poor Standing balance comment: reliant on RW     Special needs/care consideration Spasms affecting mobility progressions        Previous Home Environment  Living Arrangements: Spouse/significant other,Children  Lives With: Spouse,Family Available Help at Discharge: Family,Available 24 hours/day Type of Home: House Home Layout: Two level,Able to live on main level with bedroom/bathroom,1/2 bath on main level Alternate Level Stairs-Number of Steps: full flight Home Access: Stairs  to enter Entrance Stairs-Number of Steps: 3 Bathroom Shower/Tub: Chiropodist: Standard Bathroom Accessibility: Yes How Accessible: Accessible via walker Home Care Services: No Additional Comments: Pt's spouse is moving bedroom furniture downstairs so pt can stay on main level.  Has 1/2 bath on first floor.  She has 5 and 63 y.o. children, and has a 21 y.o. daughter who will stay after discharge to assist her   Discharge Living Setting Plans for Discharge Living Setting: Patient's home,Lives with (comment) (spouse and children) Type of Home at Discharge: House Discharge Home Layout: Two level,1/2 bath on main level,Bed/bath upstairs (spouse setting up downstairs bedroom) Alternate Level Stairs-Number of Steps: flight Discharge Home Access: Stairs to enter Entrance Stairs-Rails: None Entrance Stairs-Number of Steps: 3 Discharge Bathroom Shower/Tub: Tub/shower unit Discharge Bathroom Toilet: Standard Discharge Bathroom Accessibility: Yes How Accessible: Accessible via walker Does the patient have any problems obtaining your medications?: No   Social/Family/Support Systems Patient Roles: Engineer, building services Information: spouse, [name] Anticipated Caregiver: spouse and adult 66 year old daughter Anticipated Caregiver's Contact Information: see above Ability/Limitations of Caregiver: spouse works, 33 year old will assist Caregiver Availability: 24/7 Discharge Plan Discussed with Primary Caregiver: Yes Is Caregiver In Agreement with Plan?: Yes Does Caregiver/Family have Issues with Lodging/Transportation while Pt is in Rehab?: No   Goals Patient/Family Goal for Rehab: supervision PT, supervision to min OT Expected length of stay: 8-12 days Pt/Family Agrees to Admission and willing to participate: Yes Program Orientation Provided & Reviewed with Pt/Caregiver Including Roles  &  Responsibilities: Yes   Decrease burden of Care through IP rehab admission: n/a    Possible need for SNF placement upon discharge:not anticipated   Patient Condition: This patient's medical and functional status has changed since the consult dated: 03/21/2021 in which the Rehabilitation Physician determined and documented that the patient's condition is appropriate for intensive rehabilitative care in an inpatient rehabilitation facility. See "History of Present Illness" (above) for medical update. Functional changes are: mod assist. Patient's medical and functional status update has been discussed with the Rehabilitation physician and patient remains appropriate for inpatient rehabilitation. Will admit to inpatient rehab today.   Preadmission Screen Completed By:  Cleatrice Burke, RN, 03/24/2021 11:10 AM ______________________________________________________________________   Discussed status with Dr. Posey Pronto on 03/24/2021 at  1111 and received approval for admission today.   Admission Coordinator:  Cleatrice Burke, time 2811 Date 03/24/2021           Revision History                                       Note Details  Author Jamse Arn, MD File Time 03/24/2021 11:17 AM  Author Type Physician Status Addendum  Last Editor Jamse Arn, MD Service Physical Medicine and Rehabilitation

## 2021-03-25 DIAGNOSIS — M5416 Radiculopathy, lumbar region: Secondary | ICD-10-CM

## 2021-03-25 LAB — COMPREHENSIVE METABOLIC PANEL
ALT: 23 U/L (ref 0–44)
AST: 14 U/L — ABNORMAL LOW (ref 15–41)
Albumin: 2.7 g/dL — ABNORMAL LOW (ref 3.5–5.0)
Alkaline Phosphatase: 85 U/L (ref 38–126)
Anion gap: 6 (ref 5–15)
BUN: 14 mg/dL (ref 6–20)
CO2: 29 mmol/L (ref 22–32)
Calcium: 9.3 mg/dL (ref 8.9–10.3)
Chloride: 102 mmol/L (ref 98–111)
Creatinine, Ser: 0.83 mg/dL (ref 0.44–1.00)
GFR, Estimated: >60 mL/min (ref >60)
Glucose, Bld: 126 mg/dL — ABNORMAL HIGH (ref 70–99)
Potassium: 4 mmol/L (ref 3.5–5.1)
Sodium: 137 mmol/L (ref 135–145)
Total Bilirubin: <0.1 mg/dL — ABNORMAL LOW (ref 0.3–1.2)
Total Protein: 5.8 g/dL — ABNORMAL LOW (ref 6.5–8.1)

## 2021-03-25 LAB — CBC WITH DIFFERENTIAL/PLATELET
Abs Immature Granulocytes: 0.38 10*3/uL — ABNORMAL HIGH (ref 0.00–0.07)
Basophils Absolute: 0.1 10*3/uL (ref 0.0–0.1)
Basophils Relative: 0 %
Eosinophils Absolute: 0.7 10*3/uL — ABNORMAL HIGH (ref 0.0–0.5)
Eosinophils Relative: 3 %
HCT: 37.2 % (ref 36.0–46.0)
Hemoglobin: 11.8 g/dL — ABNORMAL LOW (ref 12.0–15.0)
Immature Granulocytes: 2 %
Lymphocytes Relative: 24 %
Lymphs Abs: 4.8 10*3/uL — ABNORMAL HIGH (ref 0.7–4.0)
MCH: 25.8 pg — ABNORMAL LOW (ref 26.0–34.0)
MCHC: 31.7 g/dL (ref 30.0–36.0)
MCV: 81.4 fL (ref 80.0–100.0)
Monocytes Absolute: 1.3 10*3/uL — ABNORMAL HIGH (ref 0.1–1.0)
Monocytes Relative: 6 %
Neutro Abs: 12.7 10*3/uL — ABNORMAL HIGH (ref 1.7–7.7)
Neutrophils Relative %: 65 %
Platelets: 388 10*3/uL (ref 150–400)
RBC: 4.57 MIL/uL (ref 3.87–5.11)
RDW: 13.6 % (ref 11.5–15.5)
WBC: 19.9 10*3/uL — ABNORMAL HIGH (ref 4.0–10.5)
nRBC: 0 % (ref 0.0–0.2)

## 2021-03-25 MED ORDER — PREDNISONE 20 MG PO TABS
60.0000 mg | ORAL_TABLET | Freq: Every day | ORAL | Status: AC
  Administered 2021-03-25 – 2021-03-26 (×2): 60 mg via ORAL
  Filled 2021-03-25 (×2): qty 3

## 2021-03-25 MED ORDER — PREDNISONE 20 MG PO TABS
20.0000 mg | ORAL_TABLET | Freq: Every day | ORAL | Status: AC
  Administered 2021-03-30 – 2021-04-01 (×3): 20 mg via ORAL
  Filled 2021-03-25 (×3): qty 1

## 2021-03-25 MED ORDER — PREDNISONE 20 MG PO TABS
40.0000 mg | ORAL_TABLET | Freq: Every day | ORAL | Status: AC
  Administered 2021-03-27 – 2021-03-29 (×3): 40 mg via ORAL
  Filled 2021-03-25 (×3): qty 2

## 2021-03-25 NOTE — Evaluation (Signed)
Occupational Therapy Assessment and Plan  Patient Details  Name: Matasha Smigelski MRN: [id number] Date of Birth: 1981/01/07  OT Diagnosis: muscle weakness (generalized) and pain in lumbar spine Rehab Potential: Rehab Potential (ACUTE ONLY): Excellent ELOS: 14 days   Today's Date: 03/25/2021 OT Individual Time: 0800-0900 and 1320-1345 OT Individual Time Calculation (min): 60 min  and 25 min  (missed 20 min of lunch)  Hospital Problem: Principal Problem:   Lumbar radiculitis   Past Medical History:  Past Medical History:  Diagnosis Date  . ADD (attention deficit disorder)   . Anemia   . Anxiety   . Asthma   . Back pain   . Bilateral swelling of feet   . Chest pain   . Constipation   . Depression   . Hyperlipidemia   . Hypertension    Preeclampsia post delivery  . Irregular heart beat   . Joint pain   . Multiple food allergies   . Obesity   . Palpitations in pediatric patient   . PONV (postoperative nausea and vomiting)    PONV; Also emotional upon waking up  . Shortness of breath   . Vitamin D deficiency    Past Surgical History:  Past Surgical History:  Procedure Laterality Date  . DILATATION & CURRETTAGE/HYSTEROSCOPY WITH RESECTOCOPE N/A 02/03/2014   Procedure: DILATATION & CURETTAGE/HYSTEROSCOPY ;  Surgeon: Delice Lesch, MD;  Location: [facility];  Service: Gynecology;  Laterality: N/A;  . DILATION AND CURETTAGE OF UTERUS    . LAMINECTOMY N/A 03/17/2021   Procedure: Laminectomy - Lumbar Four-Lumbar Five for Intradural mass;  Surgeon: Kary Kos, MD;  Location: [facility];  Service: Neurosurgery;  Laterality: N/A;  3C  . MYOMECTOMY N/A 02/03/2014   Procedure: MYOMECTOMY ;  Surgeon: Delice Lesch, MD;  Location: [facility];  Service: Gynecology;  Laterality: N/A;  . SALPINGECTOMY    . TONSILLECTOMY    . tonsils and adnoids removed    . TUBAL LIGATION N/A 08/19/2015   Procedure: POST PARTUM TUBAL LIGATION;  Surgeon: Everett Graff, MD;  Location: [facility];  Service: Gynecology;   Laterality: N/A;  . WISDOM TOOTH EXTRACTION      Assessment & Plan Clinical Impression: Lawson Bowlds is a 40 year old female with history of ADD, preeclampsia, depression, obesity, back pain with numbness and tingling BLE/feet, spasms with involuntary spasms, falls and bladder incontinence with progressive symptoms for past 2 months. 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 History taken from chart review and patient. She was admitted on 03/17/2021 for decompressive L4 and L5 laminectomy for tumor excision with Dr. Saintclair Halsted. [facility] course complicated by lower extremity spasms, tachycardia as well as dysesthesias. She was limited by pain and Medrol Dosepak was started on 03/21/2021 2 and on 03/25/2021. Therapy ongoing with some improvement in total body spasms--still worse at nights, ongoing RLE weakness, sensory deficits and weakness. CIR recommended due to functional decline. Please see preadmission assessment from earlier today as well.  Patient transferred to CIR on 03/24/2021 .    Patient currently requires mod with basic self-care skills secondary to muscle weakness and muscle joint tightness, decreased coordination and decreased standing balance and acute pain.  Prior to hospitalization, patient was fully independent and working.  Patient will benefit from skilled intervention to increase independence with basic self-care skills and increase level of independence with iADL prior to discharge home with care partner.  Anticipate patient will require intermittent supervision and follow up home health.  OT - End of Session Activity Tolerance: Tolerates  10 - 20 min activity with multiple rests Endurance Deficit: Yes Endurance Deficit Description: limited by pain OT Assessment Rehab Potential (ACUTE ONLY): Excellent OT Barriers to Discharge: Home environment access/layout OT Barriers to Discharge Comments: 5 steps into home, full flight up stairs to full bathroom OT Patient  demonstrates impairments in the following area(s): Balance OT Basic ADL's Functional Problem(s): Bathing;Dressing;Toileting OT Advanced ADL's Functional Problem(s): Simple Meal Preparation;Light Housekeeping OT Transfers Functional Problem(s): Toilet;Tub/Shower OT Additional Impairment(s): None OT Plan OT Intensity: Minimum of 1-2 x/day, 45 to 90 minutes OT Frequency: 5 out of 7 days OT Duration/Estimated Length of Stay: 14 days OT Treatment/Interventions: Balance/vestibular training;Discharge planning;Functional mobility training;DME/adaptive equipment instruction;Pain management;Patient/family education;Psychosocial support;Therapeutic Activities;Self Care/advanced ADL retraining;Therapeutic Exercise;UE/LE Strength taining/ROM;UE/LE Coordination activities OT Self Feeding Anticipated Outcome(s): no goal, pt is independent OT Basic Self-Care Anticipated Outcome(s): Mod I OT Toileting Anticipated Outcome(s): Mod I OT Bathroom Transfers Anticipated Outcome(s): Mod I OT Recommendation Patient destination: Home Follow Up Recommendations: Home health OT Equipment Recommended: To be determined;3 in 1 bedside comode Equipment Details: may need a shower chair or tub bench if pt can access upstairs bathroom   OT Evaluation Precautions/Restrictions  Precautions Precautions: Back;Fall Restrictions Weight Bearing Restrictions: No  Pain Pain Assessment Pain Scale: 0-10 Pain Score: 9  Pain Type: Acute pain Pain Location: Back Pain Orientation: Lower Pain Descriptors / Indicators: Squeezing Pain Onset: Gradual Pain Intervention(s): Medication (See eMAR) Multiple Pain Sites: Yes 2nd Pain Site Pain Score: 4 Pain Type: Acute pain Pain Location: Hip Pain Orientation: Left Pain Descriptors / Indicators: Throbbing Pain Onset: With Activity (reports it was from showering with OT earlier) Pain Intervention(s): Rest;Medication (See eMAR);RN made aware;Emotional support;Distraction Home  Living/Prior [place] expects to be discharged to:: Private residence Living Arrangements: Spouse/significant other,Children Available Help at Discharge: Family,Available PRN/intermittently (husband works full time on 3rd shift; pt's mother & father can be in/out to support; or husband's mother can provide support but no physical assistance) Type of Home: House Home Access: Stairs to enter Technical brewer of Steps: 4-5 Entrance Stairs-Rails: [place]: Two level,Able to live on main level with bedroom/bathroom,1/2 bath on main level Bathroom Shower/Tub: Tub/shower unit,Walk-in shower (upstairs) Additional Comments: Pt's spouse is moving bedroom furniture downstairs so pt can stay on main level.  Has 1/2 bath on first floor.  She has 5 and 11 y.o. children  Lives With: Spouse,Family (husband, [name], and 8y.o. daughter and 5y.o. 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 Baseline Vision/History: Wears glasses Wears Glasses: At all times Patient Visual Report: No change from baseline Vision Assessment?: No apparent visual deficits Perception  Perception: Within Functional Limits Praxis Praxis: Intact Cognition Overall Cognitive Status: Within Functional Limits for tasks assessed Arousal/Alertness: Awake/alert Orientation Level: Person;Place;Situation Person: Oriented Place: Oriented Situation: Oriented Year: 2022 Month: June Day of Week: Correct Memory: Appears intact Immediate Memory Recall: Sock;Blue;Bed Memory Recall Sock: Without Cue Memory Recall Blue: Without Cue Memory Recall Bed: Without Cue Attention: Focused;Sustained Focused Attention: Appears intact Sustained  Attention: Appears intact Awareness: Appears intact Problem Solving: Appears intact Safety/Judgment: Appears intact Sensation Sensation Light Touch: Appears Intact Hot/Cold: Appears Intact Proprioception: Appears Intact Stereognosis: Appears Intact Coordination Gross Motor Movements are Fluid and Coordinated: No Fine Motor Movements are Fluid and Coordinated: Yes Coordination and Movement Description: limited by pain in back  and LE weakness Finger Nose Finger Test: [facility] Motor  Motor Motor - Skilled Clinical Observations: weakness in LEs  Trunk/Postural Assessment  Cervical Assessment Cervical Assessment: Within Functional Limits Thoracic Assessment Thoracic Assessment: Within Functional Limits Lumbar Assessment Lumbar Assessment: Exceptions to [facility] (back precautions due to spinal surgery) Postural Control Postural Control: Deficits on evaluation (limited back extension in neutral as pt guards due to pain)  Balance Static Sitting Balance Static Sitting - Level of Assistance: 5: Stand by assistance Dynamic Sitting Balance Dynamic Sitting - Level of Assistance: Not tested (comment) Sitting balance - Comments: limited due to spinal precautions Static Standing Balance Static Standing - Level of Assistance: 3: Mod assist (with no UE support, CGA with RW support) Extremity/Trunk Assessment RUE Assessment RUE Assessment: Within Functional Limits LUE Assessment LUE Assessment: Within Functional Limits  Care Tool Care Tool Self Care Eating   Eating Assist Level: Independent    Oral Care    Oral Care Assist Level: Set up assist    Bathing   Body parts bathed by patient: Right arm;Left arm;Chest;Abdomen;Front perineal area;Right upper leg;Left upper leg;Face;Buttocks     Assist Level: Minimal Assistance - Patient > 75%    Upper Body Dressing(including orthotics)   What is the patient wearing?: Pull over shirt   Assist Level: Set up assist    Lower Body Dressing  (excluding footwear)   What is the patient wearing?: Pants;Underwear/pull up Assist for lower body dressing: Moderate Assistance - Patient 50 - 74%    Putting on/Taking off footwear   What is the patient wearing?: Shoes;Socks Assist for footwear: Moderate Assistance - Patient 50 - 74%       Care Tool Toileting Toileting activity   Assist for toileting: Minimal Assistance - Patient > 75%     Care Tool Bed Mobility Roll left and right activity   Roll left and right assist level: Supervision/Verbal cueing    Sit to lying activity   Sit to lying assist level: Minimal Assistance - Patient > 75%    Lying to sitting edge of bed activity   Lying to sitting edge of bed assist level: Contact Guard/Touching assist     Care Tool Transfers Sit to stand transfer   Sit to stand assist level: Minimal Assistance - Patient > 75%    Chair/bed transfer   Chair/bed transfer assist level: Minimal Assistance - Patient > 75%     Toilet transfer   Assist Level: Minimal Assistance - Patient > 75%     Care Tool Cognition Expression of Ideas and Wants Expression of Ideas and Wants: Without difficulty (complex and basic) - expresses complex messages without difficulty and with speech that is clear and easy to understand   Understanding Verbal and Non-Verbal Content Understanding Verbal and Non-Verbal Content: Understands (complex and basic) - clear comprehension without cues or repetitions   Memory/Recall Ability *first 3 days only Memory/Recall Ability *first 3 days only: Current season;That he or she is in a hospital/hospital unit;Location of own room;Staff names and faces    Refer to Care Plan for Long Term Goals  SHORT TERM GOAL WEEK 1 OT Short Term Goal 1 (Week 1): Pt will don LB clothing with AE with S. OT Short Term Goal 2 (Week 1): Pt will be able to toilet with S. OT Short Term Goal 3 (Week 1): Pt will be able to tolerate standing at the sink to brush her teeth.  Recommendations for  other services: None    Skilled Therapeutic Intervention ADL ADL  Eating: Independent Grooming: Setup Upper Body Bathing: Supervision/safety Where Assessed-Upper Body Bathing: Shower Lower Body Bathing: Moderate assistance Where Assessed-Lower Body Bathing: Shower Upper Body Dressing: Supervision/safety Lower Body Dressing: Moderate assistance Toileting: Minimal assistance Where Assessed-Toileting: Glass blower/designer: Psychiatric nurse Method: Counselling psychologist: Raised Counselling psychologist: Moderate assistance Social research officer, government Method: Heritage manager: Transfer tub bench;Grab bars Mobility  Bed Mobility Bed Mobility: Supine to Sit;Sit to Supine Supine to Sit: Moderate Assistance - Patient 50-74% Sit to Supine: Moderate Assistance - Patient 50-74% Transfers Sit to Stand: Minimal Assistance - Patient > 75% Stand to Sit: Minimal Assistance - Patient > 75%   Visit 1:  Pain: 7/10 in low back, premedicated, RN aware of her pain Pt seen for initial evaluation and ADL training with a focus on use of AE to maintain back precautions and activity tolerance.  Pt very eager to take a shower and said her surgeon recommended covering her incision site.  Placed tegaderm over site.  Pt sat to EOB slowly and then with min A used Rw to ambulate to shower bench. From bench, used long sponge to reach feet and lateral leans to reach bottom.  After shower, pt stood from bench with mod A and as she started walking back to recliner had increased back pain and LE spasms. Pt wanted to keep taking steps vs sitting down. Once in recliner, pt opted to sit and rest as she was wrapped up in towels and wait to don clothing for PT session.  She was still in a lot of pain and needed rest time. Pt resting in recliner with all needs met.   Visit 2: Pain: 5/10 low back pain Pt had just received her lunch tray at start of the session.  She  needed time to eat and so we agreed to start therapy after she ate. Pt worked on light AROM arm exercises with 1 # weights.  Pt worked on arm circles forward and to the side. Encouraged her to try some of the exercises tomorrow when she did not have therapy scheduled.  Pt stood from wc with CGA to RW and then transferred to EOB. Mod A to move to supine. Pt adjusted in bed with all needs met.   Discharge Criteria: Patient will be discharged from OT if patient refuses treatment 3 consecutive times without medical reason, if treatment goals not met, if there is a change in medical status, if patient makes no progress towards goals or if patient is discharged from hospital.  The above assessment, treatment plan, treatment alternatives and goals were discussed and mutually agreed upon: by patient  [facility] 03/25/2021, 12:40 PM

## 2021-03-25 NOTE — Progress Notes (Signed)
Inpatient Rehabilitation Medication Review by a Pharmacist   A complete drug regimen review was completed for this patient to identify any potential clinically significant medication issues.   Clinically significant medication issues were identified:  yes     Type of Medication Issue Identified Description of Issue Urgent (address now) Non-Urgent (address on AM team rounds) Plan Plan Accepted by Provider? (Yes / No / Pending AM Rounds)  Drug Interaction(s) (clinically significant)            Duplicate Therapy            Allergy            No Medication Administration End Date            Incorrect Dose            Additional Drug Therapy Needed            Other   Clarify plan for methylprednisolone. Discharge summary outlined a prednisone taper: Prednisone 20 mg (3 po for 2 days, 2 po for 3 days, 1 po for 3 days).  Non-urgent  Taper as appropriate  Taper entered per MD/discharge summary      Name of provider notified for urgent issues identified: Dr. Ranell Patrick   Provider Method of Notification: Secure chat   Time spent performing this drug regimen review (minutes):  20 minutes  Rebbeca Paul, PharmD PGY1 Pharmacy Resident 03/25/2021 2:41 PM  Please check [email or web address] for unit-specific pharmacy phone numbers.

## 2021-03-25 NOTE — Progress Notes (Signed)
PROGRESS NOTE   Subjective/Complaints: Sleepy Patient's chart reviewed- No issues reported overnight BP elevated WBC markedly elevated on steroids, afebrile  ROS: +somnolence  Objective:   No results found. Recent Labs    03/25/21 0505  WBC 19.9*  HGB 11.8*  HCT 37.2  PLT 388   Recent Labs    03/25/21 0505  NA 137  K 4.0  CL 102  CO2 29  GLUCOSE 126*  BUN 14  CREATININE 0.83  CALCIUM 9.3    Intake/Output Summary (Last 24 hours) at 03/25/2021 1123 Last data filed at 03/25/2021 0806 Gross per 24 hour  Intake 480 ml  Output --  Net 480 ml        Physical Exam: Vital Signs Blood pressure (!) 142/97, pulse 83, temperature 98.7 F (37.1 C), resp. rate 20, height 5\' 2"  (1.575 m), weight 120.7 kg, last menstrual period 03/03/2021, SpO2 99 %, unknown if currently breastfeeding. Gen: no distress, normal appearing HEENT: oral mucosa pink and moist, NCAT Cardio: Reg rate Chest: normal effort, normal rate of breathing Abd: soft, non-distended Musculoskeletal:        General: No swelling or tenderness.     Cervical back: Normal range of motion and neck supple.     Comments: No edema or tenderness in extremities  Skin:    General: Skin is warm and dry.     Comments: Back incision intact with steri strips in place.   Neurological:     Mental Status: She is alert.     Comments: Alert and oriented Motor: Bilateral upper extremities: 5/5 proximal distal Bilateral lower extremities: Flexion, knee extension 4 -/5, ankle dorsiflexion 5/5 Sensation intact light touch  Psychiatric:        Mood and Affect: Mood normal.        Behavior: Behavior normal.        Thought Content: Thought content normal.    No results found for this or any previous visit (from the past 48 hour(s)). No results found.   Assessment/Plan: 1. Functional deficits which require 3+ hours per day of interdisciplinary therapy in a  comprehensive inpatient rehab setting.  Physiatrist is providing close team supervision and 24 hour management of active medical problems listed below.  Physiatrist and rehab team continue to assess barriers to discharge/monitor patient progress toward functional and medical goals  Care Tool:  Bathing              Bathing assist       Upper Body Dressing/Undressing Upper body dressing   What is the patient wearing?: Hospital gown only    Upper body assist Assist Level: Minimal Assistance - Patient > 75%    Lower Body Dressing/Undressing Lower body dressing            Lower body assist       Toileting Toileting    Toileting assist Assist for toileting: Minimal Assistance - Patient > 75%     Transfers Chair/bed transfer  Transfers assist     Chair/bed transfer assist level: Minimal Assistance - Patient > 75%     Locomotion Ambulation   Ambulation assist  Walk 10 feet activity   Assist           Walk 50 feet activity   Assist           Walk 150 feet activity   Assist           Walk 10 feet on uneven surface  activity   Assist           Wheelchair     Assist               Wheelchair 50 feet with 2 turns activity    Assist            Wheelchair 150 feet activity     Assist          Blood pressure (!) 142/97, pulse 83, temperature 98.7 F (37.1 C), resp. rate 20, height 5\' 2"  (1.575 m), weight 120.7 kg, last menstrual period 03/03/2021, SpO2 99 %, unknown if currently breastfeeding.    Medical Problem List and Plan: 1.  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               -patient may not shower             -ELOS/Goals: 10-14 days/supervision/mod I             Initial CIR evals today 2.  Antithrombotics: -DVT/anticoagulation:  Pharmaceutical: Heparin             -antiplatelet therapy: N/A 3. Pain Management:  Continue Oxycodone prn             --On Gabapentin tid.              --will add baclofen tid prn. Continue flexeril at bedtime.              Monitor with increased exertion 4. Mood: LCSW to follow for evaluation and support             -antipsychotic agents: N/AA 5. Neuropsych: This patient is capable of making decisions on her own behalf. 6. Skin/Wound Care: Monitor incision for healing.             -- Routine pressure-relief measures. 7. Fluids/Electrolytes/Nutrition: Monitor intake/output.             CMP stable, monitor weekly.  8.  Elevated blood pressure: Trending upwards question due to methylprednisolone and/or pain.             -- Monitor 3 times daily.  Hydralazine prn for 24 hours.              Monitor with increased exertion 9. Neurogenic bladder: Incontinence has resolved but now with urgency/feeling of not emptying fully             PVRs ordered 10. Anxiety/ADD/ADHD/Depression: has been under lot of stress.              --Continue Vyvanse and Cymbalta             Team support 11. Leukocytosis: likely 2/2 steroids, afebrile, repeat Monday.   LOS: 1 days A FACE TO FACE EVALUATION WAS PERFORMED  Martha Clan P Jashad Depaula 03/25/2021, 11:23 AM

## 2021-03-25 NOTE — Progress Notes (Signed)
Physical Therapy Session Note  Patient Details  Name: Shannon [name] MRN: [id number] Date of Birth: 25-Nov-1980  Today's Date: 03/25/2021 PT Missed Time: 53 Minutes Missed Time Reason: Patient fatigue;Pain  Pt received supine, asleep in bed. Upon awakening pt states that the amount of therapy today was "a lot" for her. Pt states that she is having 7/10 pain in her back [name], RN notified. Pt politely requests to rest at this time and is apologetic for not being able to participate. Pt left supine in bed with needs in reach and bed alarm on. Missed 45 minutes of skilled physical therapy.  Tawana Scale , PT, DPT, CSRS  03/25/2021, 4:42 PM

## 2021-03-25 NOTE — Plan of Care (Signed)
  Problem: Consults Goal: RH SPINAL CORD INJURY PATIENT EDUCATION Description:  See Patient Education module for education specifics.  Outcome: Progressing Goal: Skin Care Protocol Initiated - if Braden Score 18 or less Description: If consults are not indicated, leave blank or document N/A Outcome: Progressing   Problem: RH SKIN INTEGRITY Goal: RH STG ABLE TO PERFORM INCISION/WOUND CARE W/ASSISTANCE Description: STG Able To Perform Incision/Wound Care With Supervision Assistance. Outcome: Progressing   Problem: RH SAFETY Goal: RH STG ADHERE TO SAFETY PRECAUTIONS W/ASSISTANCE/DEVICE Description: STG Adhere to Safety Precautions With Cues and Reminders. Outcome: Progressing   Problem: RH PAIN MANAGEMENT Goal: RH STG PAIN MANAGED AT OR BELOW PT'S PAIN GOAL Description: < 4 on a 0-10 pain scale. Outcome: Progressing   Problem: RH KNOWLEDGE DEFICIT SCI Goal: RH STG INCREASE KNOWLEDGE OF SELF CARE AFTER SCI Description: Patient will be able to demonstrate knowledge of medication management, pain management, skin/wound care with educational materials and handouts provided by staff independently at discharge. Outcome: Progressing

## 2021-03-25 NOTE — Evaluation (Signed)
Physical Therapy Assessment and Plan  Patient Details  Name: Shannon [name] MRN: [id number] Date of Birth: [birth year]  PT Diagnosis: Abnormal posture, Abnormality of gait, Difficulty walking, Muscle weakness and Pain in back Rehab Potential: Good ELOS: ~ 2weeks   Today's Date: 03/25/2021 PT Individual Time: 1105-1205 PT Individual Time Calculation (min): 60 min    Hospital Problem: Principal Problem:   Lumbar radiculitis   Past Medical History:  Past Medical History:  Diagnosis Date  . ADD (attention deficit disorder)   . Anemia   . Anxiety   . Asthma   . Back pain   . Bilateral swelling of feet   . Chest pain   . Constipation   . Depression   . Hyperlipidemia   . Hypertension    Preeclampsia post delivery  . Irregular heart beat   . Joint pain   . Multiple food allergies   . Obesity   . Palpitations in pediatric patient   . PONV (postoperative nausea and vomiting)    PONV; Also emotional upon waking up  . Shortness of breath   . Vitamin D deficiency    Past Surgical History:  Past Surgical History:  Procedure Laterality Date  . DILATATION & CURRETTAGE/HYSTEROSCOPY WITH RESECTOCOPE N/A 02/03/2014   Procedure: DILATATION & CURETTAGE/HYSTEROSCOPY ;  Surgeon: Delice Lesch, MD;  Location: [facility];  Service: Gynecology;  Laterality: N/A;  . DILATION AND CURETTAGE OF UTERUS    . LAMINECTOMY N/A 03/17/2021   Procedure: Laminectomy - Lumbar Four-Lumbar Five for Intradural mass;  Surgeon: Kary Kos, MD;  Location: [facility];  Service: Neurosurgery;  Laterality: N/A;  3C  . MYOMECTOMY N/A 02/03/2014   Procedure: MYOMECTOMY ;  Surgeon: Delice Lesch, MD;  Location: [facility];  Service: Gynecology;  Laterality: N/A;  . SALPINGECTOMY    . TONSILLECTOMY    . tonsils and adnoids removed    . TUBAL LIGATION N/A 08/19/2015   Procedure: POST PARTUM TUBAL LIGATION;  Surgeon: Everett Graff, MD;  Location: [facility];  Service: Gynecology;  Laterality: N/A;  . WISDOM TOOTH EXTRACTION       Assessment & Plan Clinical Impression: Patient is a 40 y.o. year old female with history of ADD, preeclampsia, depression, obesity, back pain with numbness and tingling BLE/feet, spasms with involuntary spasms, falls and bladder incontinence with progressive symptoms for past 2 months.  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  History taken from chart review and patient.  She was admitted on 03/17/2021 for decompressive L4 and L5 laminectomy for tumor excision with Dr. Saintclair Halsted.  [facility] course complicated by lower extremity spasms, tachycardia as well as dysesthesias.  She was limited by pain and Medrol Dosepak was started on 03/21/2021 2 and on 03/25/2021.  Therapy ongoing with some improvement in total body spasms--still worse at nights, ongoing RLE weakness, sensory deficits and weakness. CIR recommended due to functional decline. Patient transferred to CIR on 03/24/2021 .   Patient currently requires min/mod assist with mobility secondary to muscle weakness and muscle joint tightness, decreased cardiorespiratoy endurance, unbalanced muscle activation and decreased standing balance, decreased postural control, decreased balance strategies and difficulty maintaining precautions.  Prior to hospitalization, patient was independent  with mobility and lived with Spouse,Family (husband, [name], and 8y.o. daughter and 5y.o. son) in a House home.  Home access is 4-5Stairs to enter.  Patient will benefit from skilled PT intervention to maximize safe functional mobility, minimize fall risk and decrease caregiver burden for planned discharge home with 24 hour supervision.  Anticipate patient will benefit from follow up OP at discharge.  PT - End of Session Activity Tolerance: Tolerates 30+ min activity with multiple rests Endurance Deficit: Yes Endurance Deficit Description: limited by pain PT Assessment Rehab Potential (ACUTE/IP ONLY): Good PT Barriers to Discharge: Inaccessible home  environment;Decreased caregiver support;Home environment access/layout PT Patient demonstrates impairments in the following area(s): Balance;Safety;Edema;Endurance;Motor;Nutrition;Pain;Skin Integrity PT Transfers Functional Problem(s): Bed Mobility;Bed to Chair;Car;Furniture PT Locomotion Functional Problem(s): Ambulation;Stairs PT Plan PT Intensity: Minimum of 1-2 x/day ,45 to 90 minutes PT Frequency: 5 out of 7 days PT Duration Estimated Length of Stay: ~ 2weeks PT Treatment/Interventions: Ambulation/gait training;Community reintegration;DME/adaptive equipment instruction;Neuromuscular re-education;Psychosocial support;Stair training;UE/LE Strength taining/ROM;Balance/vestibular training;Discharge planning;Functional electrical stimulation;Pain management;Skin care/wound management;Therapeutic Activities;UE/LE Coordination activities;Cognitive remediation/compensation;Disease management/prevention;Functional mobility training;Patient/family education;Splinting/orthotics;Therapeutic Exercise;Visual/perceptual remediation/compensation PT Transfers Anticipated Outcome(s): supervision using LRAD PT Locomotion Anticipated Outcome(s): supervision using LRAD PT Recommendation Recommendations for Other Services: Therapeutic Recreation consult Therapeutic Recreation Interventions: Stress management Follow Up Recommendations: 24 hour supervision/assistance;Outpatient PT Patient destination: Home Equipment Recommended: To be determined   PT Evaluation Precautions/Restrictions Precautions Precautions: Back;Fall Restrictions Weight Bearing Restrictions: No Pain Pain Assessment Pain Scale: 0-10 Pain Score: 9  Pain Type: Acute pain Pain Location: Back (muscles) Pain Orientation: Lower Pain Descriptors / Indicators: Squeezing ("someone squeezing it to death") Pain Onset: On-going Pain Intervention(s): Medication (See eMAR);RN made aware;Relaxation;Rest;Repositioned;Emotional  support;Distraction Multiple Pain Sites: Yes 2nd Pain Site Pain Score: 4 Pain Type: Acute pain Pain Location: Hip Pain Orientation: Left Pain Descriptors / Indicators: Throbbing Pain Onset: With Activity (reports it was from showering with OT earlier) Pain Intervention(s): Rest;Medication (See eMAR);RN made aware;Emotional support;Distraction Home Living/Prior Functioning Home Living Available Help at Discharge: Family;Available PRN/intermittently (husband works full time on 3rd shift; pt's mother & father can be in/out to support; or husband's mother can provide support but no physical assistance) Type of Home: House Home Access: Stairs to enter [employer] of Steps: 4-5 Entrance Stairs-Rails: Left;Right Home Layout: Two level;Able to live on main level with bedroom/bathroom;1/2 bath on main level Additional Comments: Pt's spouse is moving bedroom furniture downstairs so pt can stay on main level.  Has 1/2 bath on first floor.  She has 5 and 54 y.o. children  Lives With: Spouse;Family (husband, [name], and 8y.o. daughter and 5y.o. son) Prior Function Level of Independence: Independent with gait;Independent with transfers;Independent with homemaking with ambulation  Able to Take Stairs?: Yes Driving: Yes Vocation: Full time employment Comments: works as a Conservation officer, nature: Within [place]: Intact  Cognition Overall Cognitive Status: Within Functional Limits for tasks assessed Arousal/Alertness: Awake/alert Orientation Level: Oriented X4 Attention: Focused;Sustained Focused Attention: Appears intact Sustained Attention: Appears intact Safety/Judgment: Appears intact Sensation  Sensation Light Touch: Appears Intact Hot/Cold: Not tested Proprioception: Appears Intact Stereognosis: Not tested Coordination Gross Motor Movements are Fluid and Coordinated: No Fine Motor Movements are Fluid and  Coordinated: Yes Coordination and Movement Description: limited by pain in back and B LE weakness Finger Nose Finger Test: [facility] Motor  Motor Motor: Other (comment);Abnormal postural alignment and control Motor - Skilled Clinical Observations: weakness in LEs   Trunk/Postural Assessment  Cervical Assessment Cervical Assessment: Within Functional Limits Thoracic Assessment Thoracic Assessment: Within Functional Limits Lumbar Assessment Lumbar Assessment: Exceptions to [facility] (post-op spine precautions) Postural Control Postural Control: Deficits on evaluation Postural Limitations: decreased due to reliance on B UE support to compensate for B LE weakness  Balance Balance Balance Assessed: Yes Static Sitting Balance Static Sitting - Balance Support: Feet supported;Bilateral  upper extremity supported Static Sitting - Level of Assistance: 5: Stand by assistance Dynamic Sitting Balance Dynamic Sitting - Balance Support: Feet supported Dynamic Sitting - Level of Assistance: Other (comment) (CGA) Sitting balance - Comments: limited due to spinal precautions Static Standing Balance Static Standing - Balance Support: During functional activity;Bilateral upper extremity supported (using RW) Static Standing - Level of Assistance: 4: Min assist Dynamic Standing Balance Dynamic Standing - Balance Support: During functional activity;Bilateral upper extremity supported (using RW) Dynamic Standing - Level of Assistance: 3: Mod assist Extremity Assessment      RLE Assessment RLE Assessment: Exceptions to [facility] Passive Range of Motion (PROM) Comments: WFL General Strength Comments: demonstrates strength impairments more proximally than distally that are further exacerbated by pain RLE Strength Right Hip Flexion: 3-/5 Right Knee Flexion: 3-/5 Right Knee Extension: 3/5 Right Ankle Dorsiflexion: 4-/5 Right Ankle Plantar Flexion: 3+/5 LLE Assessment LLE Assessment: Exceptions to [facility] Passive Range  of Motion (PROM) Comments: WFL with pain into hip flexion General Strength Comments: demonstrates strength impairments more proximally than distally that are further exacerbated by pain LLE Strength Left Hip Flexion: 2+/5 Left Knee Flexion: 3/5 Left Knee Extension: 3-/5 Left Ankle Dorsiflexion: 4/5 Left Ankle Plantar Flexion: 4/5  Care Tool Care Tool Bed Mobility Roll left and right activity   Roll left and right assist level: Supervision/Verbal cueing    Sit to lying activity   Sit to lying assist level: Moderate Assistance - Patient 50 - 74% (not using bed features)    Lying to sitting edge of bed activity   Lying to sitting edge of bed assist level: Moderate Assistance - Patient 50 - 74% (not using bed features)     Care Tool Transfers Sit to stand transfer Sit to stand activity did not occur: Safety/medical concerns (required use of RW)     Chair/bed transfer Chair/bed transfer activity did not occur: Safety/medical concerns (required use of RW)      Mining engineer transfer activity did not occur: Safety/medical concerns (pain)        Care Tool Locomotion Ambulation Ambulation activity did not occur: Safety/medical concerns (requires use of RW)        Walk 10 feet activity Walk 10 feet activity did not occur: Safety/medical concerns       Walk 50 feet with 2 turns activity Walk 50 feet with 2 turns activity did not occur: Safety/medical concerns      Walk 150 feet activity Walk 150 feet activity did not occur: Safety/medical concerns      Walk 10 feet on uneven surfaces activity Walk 10 feet on uneven surfaces activity did not occur: Safety/medical concerns      Stairs Stair activity did not occur: Safety/medical concerns        Walk up/down 1 step activity Walk up/down 1 step or curb (drop down) activity did not occur: Safety/medical concerns     Walk up/down 4 steps activity did not occuR: Safety/medical concerns  Walk up/down  4 steps activity      Walk up/down 12 steps activity Walk up/down 12 steps activity did not occur: Safety/medical concerns      Pick up small objects from floor Pick up small object from the floor (from standing position) activity did not occur: Safety/medical concerns      Wheelchair Will patient use wheelchair at discharge?: No          Wheel 50 feet with 2 turns activity  Wheel 150 feet activity        Refer to Care Plan for Long Term Goals  SHORT TERM GOAL WEEK 1 PT Short Term Goal 1 (Week 1): Pt wil perform supine<>sit CGA PT Short Term Goal 2 (Week 1): Sit<>stand and stand pivot transfers using LRAD with CGA PT Short Term Goal 3 (Week 1): Pt will ambulate at least 48f using LRAD with CGA PT Short Term Goal 4 (Week 1): Pt will ascend/descend 4 steps using B HRs with mod assist  Recommendations for other services: Therapeutic Recreation  Stress management  Skilled Therapeutic Intervention Pt received supine in bed and agreeable to therapy session. Evaluation completed (see details above) with patient education regarding purpose of PT evaluation, PT POC and goals, therapy schedule, weekly team meetings, and other CIR information including safety plan and fall risk safety. Report 9/10 back pain - details above - this significantly limitates her success with functional mobility tasks during this session. Pt able to recall spine precautions without cuing and therapist educating on application to functional mobility tasks. Therapist provided pt with w/c and cushion to promote increased upright, OOB activity tolerance. Supine>sit via logroll technique, HOB partially elevated as pt unable to tolerate lying flat, with mod assist for trunk upright. During gait trails pt demos significantly wide BOS with flexed hip/knee posturing in crouched position with pt stating she is unable to fully extend LEs because of increased pain upon attempts - shuffles her feet forward with very small steps  unable to fully weight shift onto 1 LE to allow swing phase on opposite. Demos very slow, guarded movements throughout entire session due to elevated pain levels. At end of session pt agreeable to remain sitting up in w/c for meal. Pt left with needs in reach and seat belt alarm on.  Mobility Bed Mobility Bed Mobility: Supine to Sit;Sit to Supine Supine to Sit: Moderate Assistance - Patient 50-74% Sit to Supine: Moderate Assistance - Patient 50-74% Transfers Transfers: Sit to Stand;Stand to Sit;Stand Pivot Transfers Sit to Stand: Minimal Assistance - Patient > 75% Stand to Sit: Minimal Assistance - Patient > 75% Stand Pivot Transfers: Minimal Assistance - Patient > 75% Stand Pivot Transfer Details: Tactile cues for posture;Tactile cues for sequencing;Verbal cues for technique;Verbal cues for gait pattern;Verbal cues for precautions/safety;Verbal cues for safe use of DME/AE;Verbal cues for sequencing;Tactile cues for weight shifting Transfer (Assistive device): Rolling walker Locomotion  Gait Ambulation: Yes Gait Assistance: Minimal Assistance - Patient > 75% Gait Distance (Feet): 5 Feet Assistive device: Rolling walker Gait Assistance Details: Tactile cues for posture;Tactile cues for sequencing;Verbal cues for sequencing;Verbal cues for technique;Verbal cues for gait pattern;Verbal cues for precautions/safety;Verbal cues for safe use of DME/AE Gait Gait: Yes Gait Pattern: Impaired Gait Pattern: Wide base of support;Poor foot clearance - left;Poor foot clearance - right;Decreased step length - right;Decreased step length - left;Decreased weight shift to left;Decreased weight shift to right;Shuffle Stairs / Additional Locomotion Stairs: No Wheelchair Mobility Wheelchair Mobility: No   Discharge Criteria: Patient will be discharged from PT if patient refuses treatment 3 consecutive times without medical reason, if treatment goals not met, if there is a change in medical status, if  patient makes no progress towards goals or if patient is discharged from hospital.  The above assessment, treatment plan, treatment alternatives and goals were discussed and mutually agreed upon: by patient  CTawana Scale, PT, DPT, CSRS 03/25/2021, 8:02 AM

## 2021-03-26 NOTE — Progress Notes (Signed)
PROGRESS NOTE   Subjective/Complaints: Yesterday we discussed pathology results- patient would like to discuss with oncology what treatment plan would be as early as possible.  She feels better energy today and very refreshed after her shower yesterday  ROS: +improved energy  Objective:   No results found. Recent Labs    03/25/21 0505  WBC 19.9*  HGB 11.8*  HCT 37.2  PLT 388   Recent Labs    03/25/21 0505  NA 137  K 4.0  CL 102  CO2 29  GLUCOSE 126*  BUN 14  CREATININE 0.83  CALCIUM 9.3    Intake/Output Summary (Last 24 hours) at 03/26/2021 1053 Last data filed at 03/26/2021 0716 Gross per 24 hour  Intake 960 ml  Output --  Net 960 ml        Physical Exam: Vital Signs Blood pressure 137/86, pulse 77, temperature 98.2 F (36.8 C), resp. rate 20, height 5\' 2"  (1.575 m), weight 120.7 kg, last menstrual period 03/03/2021, SpO2 96 %, unknown if currently breastfeeding. Gen: no distress, normal appearing HEENT: oral mucosa pink and moist, NCAT Cardio: Reg rate Chest: normal effort, normal rate of breathing Abd: soft, non-distended Ext: no edema Psych: pleasant, normal affect Musculoskeletal:        General: No swelling or tenderness.     Cervical back: Normal range of motion and neck supple.     Comments: No edema or tenderness in extremities  Skin:    General: Skin is warm and dry.     Comments: Back incision intact with steri strips in place.   Neurological:     Mental Status: She is alert.     Comments: Alert and oriented Motor: Bilateral upper extremities: 5/5 proximal distal Bilateral lower extremities: Flexion, knee extension 4 -/5, ankle dorsiflexion 5/5 Sensation intact light touch  Psychiatric:        Mood and Affect: Mood normal.        Behavior: Behavior normal.        Thought Content: Thought content normal.    No results found for this or any previous visit (from the past 48  hour(s)). No results found.   Assessment/Plan: 1. Functional deficits which require 3+ hours per day of interdisciplinary therapy in a comprehensive inpatient rehab setting.  Physiatrist is providing close team supervision and 24 hour management of active medical problems listed below.  Physiatrist and rehab team continue to assess barriers to discharge/monitor patient progress toward functional and medical goals  Care Tool:  Bathing    Body parts bathed by patient: Right arm,Left arm,Chest,Abdomen,Front perineal area,Right upper leg,Left upper leg,Face,Buttocks         Bathing assist Assist Level: Minimal Assistance - Patient > 75%     Upper Body Dressing/Undressing Upper body dressing   What is the patient wearing?: Pull over shirt    Upper body assist Assist Level: Set up assist    Lower Body Dressing/Undressing Lower body dressing      What is the patient wearing?: Pants,Underwear/pull up     Lower body assist Assist for lower body dressing: Moderate Assistance - Patient 50 - 74%     Toileting Toileting  Toileting assist Assist for toileting: Minimal Assistance - Patient > 75%     Transfers Chair/bed transfer  Transfers assist  Chair/bed transfer activity did not occur: Safety/medical concerns (required use of RW)  Chair/bed transfer assist level: Minimal Assistance - Patient > 75%     Locomotion Ambulation   Ambulation assist   Ambulation activity did not occur: Safety/medical concerns (requires use of RW)          Walk 10 feet activity   Assist  Walk 10 feet activity did not occur: Safety/medical concerns        Walk 50 feet activity   Assist Walk 50 feet with 2 turns activity did not occur: Safety/medical concerns         Walk 150 feet activity   Assist Walk 150 feet activity did not occur: Safety/medical concerns         Walk 10 feet on uneven surface  activity   Assist Walk 10 feet on uneven surfaces activity  did not occur: Safety/medical concerns         Wheelchair     Assist Will patient use wheelchair at discharge?: No             Wheelchair 50 feet with 2 turns activity    Assist            Wheelchair 150 feet activity     Assist          Blood pressure 137/86, pulse 77, temperature 98.2 F (36.8 C), resp. rate 20, height 5\' 2"  (1.575 m), weight 120.7 kg, last menstrual period 03/03/2021, SpO2 96 %, unknown if currently breastfeeding.    Medical Problem List and Plan: 1.  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               -patient may not shower             -ELOS/Goals: 10-14 days/supervision/mod I           Continue CIR 2.  Antithrombotics: -DVT/anticoagulation:  Pharmaceutical: Heparin             -antiplatelet therapy: N/A 3. Pain Management: Continue Oxycodone prn             --On Gabapentin tid.              --will add baclofen tid prn. Continue flexeril at bedtime.              Monitor with increased exertion 4. Mood: LCSW to follow for evaluation and support             -antipsychotic agents: N/AA 5. Neuropsych: This patient is capable of making decisions on her own behalf. 6. Skin/Wound Care: Monitor incision for healing.             -- Routine pressure-relief measures. 7. Fluids/Electrolytes/Nutrition: Monitor intake/output.             CMP stable, monitor weekly.  8.  Elevated blood pressure:              --Better controlled, continue to Monitor 3 times daily.  Hydralazine prn for 24 hours.              Monitor with increased exertion 9. Neurogenic bladder: Incontinence has resolved but now with urgency/feeling of not emptying fully             PVRs ordered 10. Anxiety/ADD/ADHD/Depression: has been under lot of stress.              --  Continue Vyvanse and Cymbalta             Team support 11. Leukocytosis: likely 2/2 steroids, afebrile, repeat Monday.  12. Ependymoma: pathology  results discussed with patient. Patient would like to learn more about this tumor and discuss treatment plan with oncology as soon as possible.  LOS: 2 days A FACE TO FACE EVALUATION WAS PERFORMED  Clide Deutscher Toneisha Savary 03/26/2021, 10:53 AM

## 2021-03-26 NOTE — Plan of Care (Signed)
  Problem: Consults Goal: RH SPINAL CORD INJURY PATIENT EDUCATION Description:  See Patient Education module for education specifics.  Outcome: Progressing Goal: Skin Care Protocol Initiated - if Braden Score 18 or less Description: If consults are not indicated, leave blank or document N/A Outcome: Progressing   Problem: RH SKIN INTEGRITY Goal: RH STG ABLE TO PERFORM INCISION/WOUND CARE W/ASSISTANCE Description: STG Able To Perform Incision/Wound Care With Supervision Assistance. Outcome: Progressing   Problem: RH SAFETY Goal: RH STG ADHERE TO SAFETY PRECAUTIONS W/ASSISTANCE/DEVICE Description: STG Adhere to Safety Precautions With Cues and Reminders. Outcome: Progressing   Problem: RH PAIN MANAGEMENT Goal: RH STG PAIN MANAGED AT OR BELOW PT'S PAIN GOAL Description: < 4 on a 0-10 pain scale. Outcome: Progressing   Problem: RH KNOWLEDGE DEFICIT SCI Goal: RH STG INCREASE KNOWLEDGE OF SELF CARE AFTER SCI Description: Patient will be able to demonstrate knowledge of medication management, pain management, skin/wound care with educational materials and handouts provided by staff independently at discharge. Outcome: Progressing

## 2021-03-27 DIAGNOSIS — D72825 Bandemia: Secondary | ICD-10-CM

## 2021-03-27 DIAGNOSIS — N3289 Other specified disorders of bladder: Secondary | ICD-10-CM

## 2021-03-27 DIAGNOSIS — C720 Malignant neoplasm of spinal cord: Secondary | ICD-10-CM

## 2021-03-27 DIAGNOSIS — M62838 Other muscle spasm: Secondary | ICD-10-CM

## 2021-03-27 LAB — URINALYSIS, ROUTINE W REFLEX MICROSCOPIC
Bilirubin Urine: NEGATIVE
Glucose, UA: NEGATIVE mg/dL
Hgb urine dipstick: NEGATIVE
Ketones, ur: NEGATIVE mg/dL
Nitrite: NEGATIVE
Protein, ur: NEGATIVE mg/dL
Specific Gravity, Urine: 1.023 (ref 1.005–1.030)
WBC, UA: >50 WBC/hpf — ABNORMAL HIGH (ref 0–5)
pH: 6 (ref 5.0–8.0)

## 2021-03-27 LAB — CBC
HCT: 37.7 % (ref 36.0–46.0)
Hemoglobin: 11.8 g/dL — ABNORMAL LOW (ref 12.0–15.0)
MCH: 25.9 pg — ABNORMAL LOW (ref 26.0–34.0)
MCHC: 31.3 g/dL (ref 30.0–36.0)
MCV: 82.7 fL (ref 80.0–100.0)
Platelets: 448 10*3/uL — ABNORMAL HIGH (ref 150–400)
RBC: 4.56 MIL/uL (ref 3.87–5.11)
RDW: 13.9 % (ref 11.5–15.5)
WBC: 26.8 10*3/uL — ABNORMAL HIGH (ref 4.0–10.5)
nRBC: 0.1 % (ref 0.0–0.2)

## 2021-03-27 LAB — BASIC METABOLIC PANEL
Anion gap: 7 (ref 5–15)
BUN: 11 mg/dL (ref 6–20)
CO2: 31 mmol/L (ref 22–32)
Calcium: 9.4 mg/dL (ref 8.9–10.3)
Chloride: 102 mmol/L (ref 98–111)
Creatinine, Ser: 0.92 mg/dL (ref 0.44–1.00)
GFR, Estimated: >60 mL/min (ref >60)
Glucose, Bld: 93 mg/dL (ref 70–99)
Potassium: 3.7 mmol/L (ref 3.5–5.1)
Sodium: 140 mmol/L (ref 135–145)

## 2021-03-27 MED ORDER — CYCLOBENZAPRINE HCL 5 MG PO TABS
5.0000 mg | ORAL_TABLET | Freq: Three times a day (TID) | ORAL | Status: DC | PRN
  Administered 2021-03-27 – 2021-03-28 (×2): 5 mg via ORAL
  Filled 2021-03-27 (×2): qty 1

## 2021-03-27 MED ORDER — ENOXAPARIN SODIUM 60 MG/0.6ML IJ SOSY
60.0000 mg | PREFILLED_SYRINGE | INTRAMUSCULAR | Status: DC
  Administered 2021-03-28 – 2021-04-05 (×9): 60 mg via SUBCUTANEOUS
  Filled 2021-03-27 (×10): qty 0.6

## 2021-03-27 MED ORDER — CYCLOBENZAPRINE HCL 10 MG PO TABS
10.0000 mg | ORAL_TABLET | Freq: Every day | ORAL | Status: DC
  Administered 2021-03-27 – 2021-04-05 (×10): 10 mg via ORAL
  Filled 2021-03-27 (×10): qty 1

## 2021-03-27 MED ORDER — ACETAMINOPHEN 325 MG PO TABS: 325.0000 mg | ORAL_TABLET | ORAL | Status: DC | PRN

## 2021-03-27 MED ORDER — DULOXETINE HCL 30 MG PO CPEP
60.0000 mg | ORAL_CAPSULE | Freq: Every day | ORAL | Status: DC
  Administered 2021-03-28 – 2021-04-06 (×10): 60 mg via ORAL
  Filled 2021-03-27 (×11): qty 2

## 2021-03-27 MED ORDER — LISDEXAMFETAMINE DIMESYLATE 70 MG PO CAPS
70.0000 mg | ORAL_CAPSULE | Freq: Every day | ORAL | Status: DC
  Administered 2021-03-28 – 2021-04-06 (×11): 70 mg via ORAL
  Filled 2021-03-27 (×11): qty 1

## 2021-03-27 NOTE — Progress Notes (Signed)
Physical Therapy Session Note  Patient Details  Name: Shannon [name] MRN: [id number] Date of Birth: 03-May-1981  Today's Date: 03/27/2021 PT Individual Time: 0800-0900; 1545-1700 PT Individual Time Calculation (min): 60 min and 75 min  Short Term Goals: Week 1:  PT Short Term Goal 1 (Week 1): Pt wil perform supine<>sit CGA PT Short Term Goal 2 (Week 1): Sit<>stand and stand pivot transfers using LRAD with CGA PT Short Term Goal 3 (Week 1): Pt will ambulate at least 51ft using LRAD with CGA PT Short Term Goal 4 (Week 1): Pt will ascend/descend 4 steps using B HRs with mod assist  Skilled Therapeutic Interventions/Progress Updates:    Session 1: Pt received seated in bed, agreeable to PT session. Pt reports pain 7/10 in back that increases with mobility. Utilized repositioning and rest breaks during session for pain management. Seated in bed to sitting EOB with Supervision with HOB elevated and use of bedrail. Pt requesting to shower this AM, prefers female therapist and scheduled with female OT this AM. Sit to stand with min A to RW throughout session. Ambulation x 10 ft into bathroom with RW and min A. Pt exhibits shuffling gait pattern, not clearing or lifting LE during gait. Pt is able to doff clothing with min A needed for pants and for shirt. Pt is setup A for bathing at shower level, incision covered in Tegaderm. Pt requires mod A to don pants (to thread BLE) and is setup A for donning shirt. Reviewed BLT back precautions. Pt is setup A for oral hygiene while seated in w/c at sink. Pt requests to return to bed at end of session. Sitting EOB to sitting in bed with Supervision, use of BUE for LE management as well as use of bedrail. Pt left seated in bed with needs in reach, bed alarm in place at end of session.  Session 2: Pt received supine in bed asleep, arousable and agreeable to PT session. Pt initially with some back pain with mobility that improves throughout session. Bed mobility Supervision  with use of bedrail and UE to assist BLE in/out of bed. Sit to stand with CGA to RW throughout session. Stand pivot transfer bed to/from w/c with RW and CGA. Ambulation in // bars 2 x 20 ft with min A for balance, focus on increasing BLE clearance, upright posture, and decreased shoulder elevation. Ambulation x 28 ft, x 26 ft with RW and min A. Pt requires increased time to complete gait. Pt returned to bed at end of session. Pt left seated in bed with needs in reach, bed alarm in place.  Therapy Documentation Precautions:  Precautions Precautions: Back,Fall Restrictions Weight Bearing Restrictions: No   Therapy/Group: Individual Therapy   Excell Seltzer, PT, DPT, CSRS  03/27/2021, 11:58 AM

## 2021-03-27 NOTE — Care Management (Signed)
Inpatient Rehabilitation Center Individual Statement of Services  Patient Name:  Shannon [name]  Date:  03/27/2021  Welcome to the [facility].  Our goal is to provide you with an individualized program based on your diagnosis and situation, designed to meet your specific needs.  With this comprehensive rehabilitation program, you will be expected to participate in at least 3 hours of rehabilitation therapies Monday-Friday, with modified therapy programming on the weekends.  Your rehabilitation program will include the following services:  Physical Therapy (PT), Occupational Therapy (OT), 24 hour per day rehabilitation nursing, Therapeutic Recreaction (TR), Psychology, Neuropsychology, Care Coordinator, Rehabilitation Medicine, Nutrition Services, Pharmacy Services and Other  Weekly team conferences will be held on Tuesdays to discuss your progress.  Your Inpatient Rehabilitation Care Coordinator will talk with you frequently to get your input and to update you on team discussions.  Team conferences with you and your family in attendance may also be held.  Expected length of stay: 14 days   Overall anticipated outcome: Supervision  Depending on your progress and recovery, your program may change. Your Inpatient Rehabilitation Care Coordinator will coordinate services and will keep you informed of any changes. Your Inpatient Rehabilitation Care Coordinator's name and contact numbers are listed  below.  The following services may also be recommended but are not provided by the [facility] will be made to provide these services after discharge if needed.  Arrangements include referral to agencies that provide these services.  Your insurance has been verified to be:  [employer]  Your primary doctor is:  Glendale Chard  Pertinent information will be shared with your doctor and your insurance company.  Inpatient Rehabilitation Care Coordinator:  Cathleen Corti [phone] or ([phone]  Information discussed with and copy given to patient by: Rana Snare, 03/27/2021, 10:49 AM

## 2021-03-27 NOTE — Progress Notes (Signed)
Patient information reviewed and entered into [facility] System by Becky Lauria Depoy, PPS coordinator. Information including medical coding, function ability, and quality indicators will be reviewed and updated through discharge.   

## 2021-03-27 NOTE — Discharge Instructions (Addendum)
  Inpatient Rehab Discharge Instructions  [facility] Discharge date and time:  04/06/21  Activities/Precautions/ Functional Status: Activity: no lifting, driving, or strenuous exercise till cleared by MD Diet: regular diet Wound Care: keep wound clean and dry    Functional status:  ___ No restrictions     ___ Walk up steps independently ___ 24/7 supervision/assistance   ___ Walk up steps with assistance _X__ Intermittent supervision/assistance  ___ Bathe/dress independently ___ Walk with walker     __X_ Bathe/dress with assistance ___ Walk Independently    ___ Shower independently ___ Walk with assistance    ___ Shower with assistance _X__ No alcohol     ___ Return to work/school ________   COMMUNITY REFERRALS UPON DISCHARGE:    Outpatient: PT             Agency: [facility]  Phone: [phone]             Appointment Date/Time: *Please expect follow-up within 7-10 business days to schedule your appointment. If you have not received follow-up, be sure to contact the site directly.*  Medical Equipment/Items Ordered: no needs                                                 Agency/Supplier: NA  Special Instructions:    My questions have been answered and I understand these instructions. I will adhere to these goals and the provided educational materials after my discharge from the hospital.  Patient/Caregiver Signature _______________________________ Date __________  Clinician Signature _______________________________________ Date __________  Please bring this form and your medication list with you to all your follow-up doctor's appointments.

## 2021-03-27 NOTE — Progress Notes (Signed)
PROGRESS NOTE   Subjective/Complaints:   Pt was asking more about ependymoma-  And plan- I explained will call Oncology tomorrow to discuss plan but explained what I've seen occur in past. And Radiation isn't a hurry to do.  Pt reports flexeril at night has been very helpful- a "game changer" in terms of pain.   Was able to sleep through the night and pain better at night- main issue during day is still muscle spasms- 10 mg flexeril makes her sleepy.     ROS:  Pt denies SOB, abd pain, CP, N/V/C/D, and vision changes   Objective:   No results found. Recent Labs    03/25/21 0505 03/27/21 0546  WBC 19.9* 26.8*  HGB 11.8* 11.8*  HCT 37.2 37.7  PLT 388 448*   Recent Labs    03/25/21 0505 03/27/21 0546  NA 137 140  K 4.0 3.7  CL 102 102  CO2 29 31  GLUCOSE 126* 93  BUN 14 11  CREATININE 0.83 0.92  CALCIUM 9.3 9.4    Intake/Output Summary (Last 24 hours) at 03/27/2021 1100 Last data filed at 03/27/2021 2778 Gross per 24 hour  Intake 960 ml  Output --  Net 960 ml        Physical Exam: Vital Signs Blood pressure (!) 149/80, pulse 90, temperature 98.3 F (36.8 C), temperature source Oral, resp. rate 20, height 5\' 2"  (1.575 m), weight 120.7 kg, last menstrual period 03/03/2021, SpO2 100 %, unknown if currently breastfeeding.   General: awake, alert, appropriate, sitting up in bed; NAD HENT: conjugate gaze; oropharynx moist CV: regular rate; no JVD Pulmonary: CTA B/L; no W/R/R- good air movement GI: soft, NT, ND, (+)BS; protuberant Psychiatric: appropriate; interactive Neurological: Ox3 Musculoskeletal:        General: No swelling or tenderness.     Cervical back: Normal range of motion and neck supple.     Comments: No edema or tenderness in extremities  Skin:    General: Skin is warm and dry.     Comments: Back incision intact with very few steristrips- looks great- no drainage or erythema    Neurological:     Mental Status: She is alert.     Comments: Alert and oriented Motor: Bilateral upper extremities: 5/5 proximal distal Bilateral lower extremities: Flexion, knee extension 4 -/5, ankle dorsiflexion 5/5 Sensation intact light touch    No results found for this or any previous visit (from the past 48 hour(s)). No results found.   Assessment/Plan: 1. Functional deficits which require 3+ hours per day of interdisciplinary therapy in a comprehensive inpatient rehab setting.  Physiatrist is providing close team supervision and 24 hour management of active medical problems listed below.  Physiatrist and rehab team continue to assess barriers to discharge/monitor patient progress toward functional and medical goals  Care Tool:  Bathing    Body parts bathed by patient: Right arm,Left arm,Chest,Abdomen,Front perineal area,Right upper leg,Left upper leg,Face,Buttocks         Bathing assist Assist Level: Minimal Assistance - Patient > 75%     Upper Body Dressing/Undressing Upper body dressing   What is the patient wearing?: Pull over shirt    Upper  body assist Assist Level: Set up assist    Lower Body Dressing/Undressing Lower body dressing      What is the patient wearing?: Pants,Underwear/pull up     Lower body assist Assist for lower body dressing: Moderate Assistance - Patient 50 - 74%     Toileting Toileting    Toileting assist Assist for toileting: Minimal Assistance - Patient > 75%     Transfers Chair/bed transfer  Transfers assist  Chair/bed transfer activity did not occur: Safety/medical concerns (required use of RW)  Chair/bed transfer assist level: Minimal Assistance - Patient > 75%     Locomotion Ambulation   Ambulation assist   Ambulation activity did not occur: Safety/medical concerns (requires use of RW)          Walk 10 feet activity   Assist  Walk 10 feet activity did not occur: Safety/medical concerns         Walk 50 feet activity   Assist Walk 50 feet with 2 turns activity did not occur: Safety/medical concerns         Walk 150 feet activity   Assist Walk 150 feet activity did not occur: Safety/medical concerns         Walk 10 feet on uneven surface  activity   Assist Walk 10 feet on uneven surfaces activity did not occur: Safety/medical concerns         Wheelchair     Assist Will patient use wheelchair at discharge?: No             Wheelchair 50 feet with 2 turns activity    Assist            Wheelchair 150 feet activity     Assist          Blood pressure (!) 149/80, pulse 90, temperature 98.3 F (36.8 C), temperature source Oral, resp. rate 20, height 5\' 2"  (1.575 m), weight 120.7 kg, last menstrual period 03/03/2021, SpO2 100 %, unknown if currently breastfeeding.    Medical Problem List and Plan: 1.  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               pt may shower             -ELOS/Goals: 10-14 days/supervision/mod I           con't PT and OT 2.  Antithrombotics: -DVT/anticoagulation:  Pharmaceutical: Heparin             -antiplatelet therapy: N/A 3. Pain Management: Continue Oxycodone prn             --On Gabapentin tid.              --will add baclofen tid prn. Continue flexeril at bedtime.   6/6- will add Flexeril 5 mg TID prn and change flexeril to 10 mg QHS- and monitor             Monitor with increased exertion 4. Mood: LCSW to follow for evaluation and support             -antipsychotic agents: N/AA 5. Neuropsych: This patient is capable of making decisions on her own behalf. 6. Skin/Wound Care: Monitor incision for healing.             -- Routine pressure-relief measures. 7. Fluids/Electrolytes/Nutrition: Monitor intake/output.             CMP stable, monitor weekly.  8.  Elevated blood pressure:              --  Better controlled, continue to Monitor 3 times  daily.  Hydralazine prn for 24 hours.   6/6- BP slightly elevated- is doing better- con't regimen             Monitor with increased exertion 9. Neurogenic bladder: Incontinence has resolved but now with urgency/feeling of not emptying fully             PVRs ordered  6/6- PVRs are OK- but has also c/o bladder spasms-and WBC is elevated even more- will check U/A and Cx  10. Anxiety/ADD/ADHD/Depression: has been under lot of stress.              --Continue Vyvanse and Cymbalta             Team support 11. Leukocytosis: likely 2/2 steroids, afebrile, repeat Monday.  6/6- up to 26k- UTI?- checking today! Will recheck labs in AM 12. Ependymoma: pathology results discussed with patient. Patient would like to learn more about this tumor and discuss treatment plan with oncology as soon as possible.   I spent a total of 35 minutes on total care today- >50% coordination of care- speaking with pt about ependymoma and discussing overall plan; oncology, etc and discussing bladder spasms and overall muscle spasms.  LOS: 3 days A FACE TO FACE EVALUATION WAS PERFORMED  Creighton Longley 03/27/2021, 11:00 AM

## 2021-03-27 NOTE — IPOC Note (Signed)
Overall Plan of Care [facility]) Patient Details Name: Shannon [name] MRN: [id number] DOB: [birth year]  Admitting Diagnosis: Lumbar radiculitis  Hospital Problems: Principal Problem:   Lumbar radiculitis     Functional Problem List: Nursing Edema,Endurance,Medication Management,Pain,Safety,Skin Integrity  PT Balance,Safety,Edema,Endurance,Motor,Nutrition,Pain,Skin Integrity  OT Balance  SLP    TR         Basic ADL's: OT Bathing,Dressing,Toileting     Advanced  ADL's: OT Simple Meal Preparation,Light Housekeeping     Transfers: PT Bed Mobility,Bed to [employer]  OT Toilet,Tub/Shower     Locomotion: PT Ambulation,Stairs     Additional Impairments: OT None  SLP        TR      Anticipated Outcomes Item Anticipated Outcome  Self Feeding no goal, pt is independent  Swallowing      Basic self-care  Mod I  Toileting  Mod I   Bathroom Transfers Mod I  Bowel/Bladder  n/a  Transfers  supervision using LRAD  Locomotion  supervision using LRAD  Communication     Cognition     Pain  <4  Safety/Judgment  supervision with no falls   Therapy Plan: PT Intensity: Minimum of 1-2 x/day ,45 to 90 minutes PT Frequency: 5 out of 7 days PT Duration Estimated Length of Stay: ~ 2weeks OT Intensity: Minimum of 1-2 x/day, 45 to 90 minutes OT Frequency: 5 out of 7 days OT Duration/Estimated Length of Stay: 14 days     Due to the current state of emergency, patients may not be receiving their 3-hours of Medicare-mandated therapy.   Team Interventions: Nursing Interventions Patient/Family Education,Disease Management/Prevention,Pain Management,Medication Management,Skin Care/Wound Management,Discharge Planning,Psychosocial Support  PT interventions Ambulation/gait training,Community Corporate treasurer re-education,Psychosocial support,Stair training,UE/LE Strength taining/ROM,Balance/vestibular training,Discharge  planning,Functional electrical stimulation,Pain management,Skin care/wound management,Therapeutic Activities,UE/LE Coordination activities,Cognitive remediation/compensation,Disease management/prevention,Functional mobility training,Patient/family education,Splinting/orthotics,Therapeutic Exercise,Visual/perceptual remediation/compensation  OT Interventions Balance/vestibular training,Discharge planning,Functional mobility training,DME/adaptive equipment instruction,Pain management,Patient/family education,Psychosocial support,Therapeutic Activities,Self Care/advanced ADL retraining,Therapeutic Exercise,UE/LE Strength taining/ROM,UE/LE Coordination activities  SLP Interventions    TR Interventions    SW/CM Interventions Discharge Planning,Psychosocial Support,Patient/Family Education   Barriers to Discharge MD  Medical stability, Home enviroment access/loayout, Neurogenic bowel and bladder, Wound care, Lack of/limited family support, Weight and Weight bearing restrictions  Nursing Decreased caregiver support,Home environment access/layout,Wound Care,Lack of/limited family support,Weight,Medication compliance,Behavior 2 level home, able to live on main level, 3 steps to enter.  PT Inaccessible home environment,Decreased caregiver support,Home environment access/layout    OT Home environment access/layout 5 steps into home, full flight up stairs to full bathroom  SLP      SW       Team Discharge Planning: Destination: PT-Home ,OT- Home , SLP-  Projected Follow-up: PT-24 hour supervision/assistance,Outpatient PT, OT-  Home health OT, SLP-  Projected Equipment Needs: PT-To be determined, OT- To be determined,3 in 1 bedside comode, SLP-  Equipment Details: PT- , OT-may need a shower chair or tub bench if pt can access upstairs bathroom Patient/family involved in discharge planning: PT- Patient,  OT-Patient, SLP-   MD ELOS: ~ 14 days Medical Rehab Prognosis:  Good Assessment: Pt is a 40 yr old  female with ependymoma of Spinal cord- lumbar area- with associated lumbar radiculitis, bladder spasms, leukocytosis, and checking U/A and U Cx since likely has UTI- also need to get Oncology involved for treatment.  Also having muscle spasms- working on these.  Goals mod I    See Team Conference Notes for weekly updates to the plan of care

## 2021-03-27 NOTE — Progress Notes (Signed)
Occupational Therapy Session Note  Patient Details  Name: Genevieve Arbaugh MRN: [id number] Date of Birth: 1981-02-20  Today's Date: 03/27/2021 OT Individual Time: 6948-5462 OT Individual Time Calculation (min): 70 min    Short Term Goals: Week 1:  OT Short Term Goal 1 (Week 1): Pt will don LB clothing with AE with S. OT Short Term Goal 2 (Week 1): Pt will be able to toilet with S. OT Short Term Goal 3 (Week 1): Pt will be able to tolerate standing at the sink to brush her teeth.  Skilled Therapeutic Interventions/Progress Updates:    Pt resting in bed upon arrival and agreeable to get OOB. Supine>sit EOB with supervision using bed rails and HOB elevated. Sit>stand and amb with RW approx 5' to w/c. OT intervention with focus on TTB transfers, walk in shower transfers, AE education and practice, discharge planning, and activity tolerance to increase independence with BADLs. TTB tranfsers with min A to lift RLE into tub. Pt able to lift BLE out of tub. Sit>stand from TTB with CGA. Pt practiced walk-in shower transfers-CGA. Pt exhibited difficulty lifting LE over 4" ledge but was able to complete without assistance. Pt provided info on TTB, shower seat, and vacuum grab bars. Pt practiced use of reacher and sock aide to assist with LB dressing tasks. Pt returned to room and transferred to bed with CGA. Sit>supine with supervision using bed functions. Pt remained in bed with all needs within reach and bed alarm activated.   Therapy Documentation Precautions:  Precautions Precautions: Back,Fall Restrictions Weight Bearing Restrictions: No  Pain:   Pt c/o 7/10 pain in back increasing with activity; rest and repositioned   Therapy/Group: Individual Therapy  Leroy Libman 03/27/2021, 12:04 PM

## 2021-03-28 LAB — CBC WITH DIFFERENTIAL/PLATELET
Abs Immature Granulocytes: 0.8 10*3/uL — ABNORMAL HIGH (ref 0.00–0.07)
Basophils Absolute: 0 10*3/uL (ref 0.0–0.1)
Basophils Relative: 0 %
Eosinophils Absolute: 0.3 10*3/uL (ref 0.0–0.5)
Eosinophils Relative: 1 %
HCT: 38.5 % (ref 36.0–46.0)
Hemoglobin: 11.9 g/dL — ABNORMAL LOW (ref 12.0–15.0)
Lymphocytes Relative: 23 %
Lymphs Abs: 5.9 10*3/uL — ABNORMAL HIGH (ref 0.7–4.0)
MCH: 25.8 pg — ABNORMAL LOW (ref 26.0–34.0)
MCHC: 30.9 g/dL (ref 30.0–36.0)
MCV: 83.3 fL (ref 80.0–100.0)
Monocytes Absolute: 1.3 10*3/uL — ABNORMAL HIGH (ref 0.1–1.0)
Monocytes Relative: 5 %
Myelocytes: 3 %
Neutro Abs: 17.5 10*3/uL — ABNORMAL HIGH (ref 1.7–7.7)
Neutrophils Relative %: 68 %
Platelets: 434 10*3/uL — ABNORMAL HIGH (ref 150–400)
RBC: 4.62 MIL/uL (ref 3.87–5.11)
RDW: 14.1 % (ref 11.5–15.5)
WBC: 25.7 10*3/uL — ABNORMAL HIGH (ref 4.0–10.5)
nRBC: 0 /100 WBC
nRBC: 0.1 % (ref 0.0–0.2)

## 2021-03-28 LAB — URINE CULTURE

## 2021-03-28 MED ORDER — FOSFOMYCIN TROMETHAMINE 3 G PO PACK
3.0000 g | PACK | Freq: Once | ORAL | Status: AC
  Administered 2021-03-28: 3 g via ORAL
  Filled 2021-03-28: qty 3

## 2021-03-28 MED ORDER — BACLOFEN 5 MG HALF TABLET
5.0000 mg | ORAL_TABLET | Freq: Three times a day (TID) | ORAL | Status: DC | PRN
  Administered 2021-03-29 – 2021-04-03 (×3): 5 mg via ORAL
  Filled 2021-03-28 (×4): qty 1

## 2021-03-28 NOTE — Patient Care Conference (Signed)
Inpatient RehabilitationTeam Conference and Plan of Care Update Date: 03/28/2021   Time: 11:01 AM    Patient Name: Shannon [name]      Medical Record Number: [id number]  Date of Birth: 1981/05/27 Sex: Female         Room/Bed: 4M03C/4M03C-01 Payor Info: Payor: [place] / Plan: [facility] PPO / Product Type: *No Product type* /    Admit Date/Time:  03/24/2021  2:46 PM  Primary Diagnosis:  Lumbar radiculitis  Hospital Problems: Principal Problem:   Lumbar radiculitis    Expected Discharge Date: Expected Discharge Date: 04/06/21  Team Members Present: Physician leading conference: Dr. Courtney Heys Care Coodinator Present: Loralee Pacas, LCSWA;Ladon Heney Creig Hines, RN, BSN, CRRN Nurse Present: Dorthula Nettles, RN PT Present: Excell Seltzer, PT OT Present: Roanna Epley, COTA;Jennifer Tamala Julian, OT PPS Coordinator present : Ileana Ladd, PT     Current Status/Progress Goal Weekly Team Focus  Bowel/Bladder   continent to bowel and bladder  reamin continent      Swallow/Nutrition/ Hydration             ADL's   bathing/dressing-min A; transfers-CGA; tub tranfsewrs-min A; toileting-mod A  mod I overall  transfers, toileting, BADL training with AE, education   Mobility   CGA transfers with RW, gait x 28 ft RW min A  Supervision overall, CGA stairs  endurance, gait, pain management   Communication             Safety/Cognition/ Behavioral Observations            Pain   0/10  keep pain level under 3/10  assess pain   Skin   skin is good  skin remain intact  assess skin every shift     Discharge Planning:  D/c to home with intermittent support from husband as he works 3rd shift; reports support from various family members, co-workers, and friends that will assist.   Team Discussion: Will need radiation once she is out of rehab. Switched to Lovenox to decrease the number of times per day she receives injections. HTN is controlled, she is not a full cord. Continent  B/B, pain is 7/10 before medication then 4/10. Incision is CDI. Reports she will have a lot of support at discharge.  Patient on target to meet rehab goals: yes, contact guard to min assist with RW. 20 ft with RW. Mod I goals, contact guard for transfers, mod assist for toileting. Limited by pain.  *See Care Plan and progress notes for long and short-term goals.   Revisions to Treatment Plan:  New UTI with bladder spasms dx'd today.  Teaching Needs: Family education, medication management, pain management, skin/wound care, transfer training, gait training, balance training, endurance training, safety awareness.  Current Barriers to Discharge: Decreased caregiver support, Medical stability, Home enviroment access/layout, Wound care, Lack of/limited family support, Weight bearing restrictions, Medication compliance, Pending chemo/radiation and Behavior  Possible Resolutions to Barriers: Continue with current medications, provide emotional support.     Medical Summary Current Status: lumbar incision- looks great; continent B/B- pain meds help-  4/10 with meds; new UTI- dx'd today- bladder spasms; pain somewhat better with baclofen prn  Barriers to Discharge: Decreased family/caregiver support;Home enviroment access/layout;Medical stability;Weight bearing restrictions;Weight;Wound care;Pending chemo/radiation;Behavior  Barriers to Discharge Comments: will need radiation after d/c for ependymoma- will have support from parents/job Possible Resolutions to Barriers/Weekly Focus: UTI- fosfomycin- pending Cx;  fearful of pain/overall; restarted baclofen 5 mg TID prn for spasms; focus on pain/spasms/anxiety- d/c -6/16  Continued Need for Acute Rehabilitation Level of Care: The patient requires daily medical management by a physician with specialized training in physical medicine and rehabilitation for the following reasons: Direction of a multidisciplinary physical rehabilitation program to maximize  functional independence : Yes Medical management of patient stability for increased activity during participation in an intensive rehabilitation regime.: Yes Analysis of laboratory values and/or radiology reports with any subsequent need for medication adjustment and/or medical intervention. : Yes   I attest that I was present, lead the team conference, and concur with the assessment and plan of the team.   Cristi Loron 03/28/2021, 4:27 PM

## 2021-03-28 NOTE — Progress Notes (Signed)
Physical Therapy Session Note  Patient Details  Name: [name] MRN: [id number] Date of Birth: 06-Jul-1981  Today's Date: 03/28/2021 PT Individual Time: 0930-1000 PT Individual Time Calculation (min): 30 min   Short Term Goals: Week 1:  PT Short Term Goal 1 (Week 1): Pt wil perform supine<>sit CGA PT Short Term Goal 2 (Week 1): Sit<>stand and stand pivot transfers using LRAD with CGA PT Short Term Goal 3 (Week 1): Pt will ambulate at least 55ft using LRAD with CGA PT Short Term Goal 4 (Week 1): Pt will ascend/descend 4 steps using B HRs with mod assist  Skilled Therapeutic Interventions/Progress Updates:    Pain:  Pt reports 7/10 pain w/activity.  Treatment to tolerance.  Rest breaks and repositioning as needed.  Pt initially supine w/hob elevated and agreeable to treatment session.  Supine to sit w/HOB elevated and use of bedrails w/supervision, additional time, cues for logrolling.   Sit to stand w/RW w/cga.  Gait 38ft to wc placed in hallway w/RW and cga, mild wobbles at knees bilat, decreased cadence and step length bilat.  Turn/sit to wc w/cga.  In parallel bars: Standing w/bars for support, tapping 3in step x 4 w/each leg as prestair training activity. Sidestepping 13ft L/R x 4 w/cga, repeated x 2 w/seated rest between efforts.  Gait 8ft wc to bed w/RW w/cga. Sit to supine w/supervision, additional time, cues for spinal precautions. Pt left supine w/rails up x 3, alarm set, bed in lowest position, and needs in reach.     Therapy Documentation Precautions:  Precautions Precautions: Back,Fall Restrictions Weight Bearing Restrictions: No    Therapy/Group: Individual Therapy  Callie Fielding, [place] 03/28/2021, 12:23 PM

## 2021-03-28 NOTE — Progress Notes (Addendum)
Patient ID: Shannon [name], female   DOB: 21-Aug-1981, 40 y.o.   MRN: [id number]  SW met with pt in room to introduce self, explain role, and discuss discharge process. SW completed assessment with pt. Pt aware SW to follow-up after team conference to provide updates.   *SW met with pt in room to provide updates from team conference, and discharge date 6/16. Pt aware SW to follow-up with her husband.   1540-SW made efforts to make contact with pt husband to provide updates but voicemail is full. SW will continue to make efforts.   *SW received phone call from pt wife to discuss above. Will follow up with SW about family education and who will attend.   Loralee Pacas, MSW, [place] Office: [phone] Cell: [phone] Fax: [phone]

## 2021-03-28 NOTE — Progress Notes (Signed)
Occupational Therapy Session Note  Patient Details  Name: Shannon [name] MRN: [id number] Date of Birth: [birth year]  Today's Date: 03/28/2021 OT Individual Time: 1031-1130 OT Individual Time Calculation (min): 59 min    Short Term Goals: Week 1:  OT Short Term Goal 1 (Week 1): Pt will don LB clothing with AE with S. OT Short Term Goal 2 (Week 1): Pt will be able to toilet with S. OT Short Term Goal 3 (Week 1): Pt will be able to tolerate standing at the sink to brush her teeth.  Skilled Therapeutic Interventions/Progress Updates:    Pt received in room in bed and consented to OT tx. Pt seen for morning ADL routine including dressing, bathing, functional mobility and functional transfer training. Pt walked with RW and close SUP from EOB to shower bench, doffed clothing with CGA and use of reacher for LB clothing. Pt bathed in shower with setup and long handled sponge with increased time for thoroughness. Pt then walked with RW and CGA from shower to EOB for dressing. Pt completed LB dressing with reacher and close SUP, UBD with independence. Pt required increased time for tasks due to fatigue. After ADLs, pt instructed in orange theraband exercises while seated EOB to increase strength and activity tolerance for ADLs and functional transfers. Pt trained in including tricep extension, elbow flexion for 4x15 with min cuing for proper technique with good carryover. After tx, pt left in bed with alarm on and all needs met.   Therapy Documentation Precautions:  Precautions Precautions: Back,Fall Restrictions Weight Bearing Restrictions: No Pain: Pain Assessment Pain Scale: 0-10 Pain Score: 0-No pain   Therapy/Group: Individual Therapy  Thi Klich 03/28/2021, 10:43 AM

## 2021-03-28 NOTE — Progress Notes (Signed)
Physical Therapy Session Note  Patient Details  Name: Shannon [name] MRN: [id number] Date of Birth: Aug 14, 1981  Today's Date: 03/28/2021 PT Individual Time: 1330-1430 PT Individual Time Calculation (min): 60 min   Short Term Goals: Week 1:  PT Short Term Goal 1 (Week 1): Pt wil perform supine<>sit CGA PT Short Term Goal 2 (Week 1): Sit<>stand and stand pivot transfers using LRAD with CGA PT Short Term Goal 3 (Week 1): Pt will ambulate at least 6ft using LRAD with CGA PT Short Term Goal 4 (Week 1): Pt will ascend/descend 4 steps using B HRs with mod assist  Skilled Therapeutic Interventions/Progress Updates:    Pt received seated in bed, agreeable to PT session. Pt reports pain in low back at rest, increases during therapy session. Nursing notified that patient requesting pain meds at end of session. Pt has palpable muscle tightness/spasming in R mid-lower back, provided hot pack at end of session for muscle relaxation and will perform soft tissue mobilizations as safe and able next session. Seated in bed to sitting EOB with HOB elevated, use of bedrail, at Supervision level. Sit to stand and transfer with RW and CGA throughout session. Ascend/descend 8 x 3" stairs with 2 handrails and min A, step-to gait pattern. Pt requires encouragement due to some fear of falling/LE buckling while descending stairs. Standing alt L/R 3" step ups with BUE support and min A for balance x 10 reps. Ambulation x 65 ft with RW with min A for balance, focus on upright trunk, increased LE clearance, and heel strike during gait. Pt exhibits improved tolerance for gait training this date, continues to exhibit slow cadence. Pt handed off to NT at end of session for toileting.  Therapy Documentation Precautions:  Precautions Precautions: Back,Fall Restrictions Weight Bearing Restrictions: No   Therapy/Group: Individual Therapy   Excell Seltzer, PT, DPT, CSRS  03/28/2021, 4:56 PM

## 2021-03-28 NOTE — Progress Notes (Signed)
Occupational Therapy Session Note  Patient Details  Name: Shannon [name] MRN: [id number] Date of Birth: 1981-06-21  Today's Date: 03/28/2021 OT Individual Time: 1614-1700 OT Individual Time Calculation (min): 46 min    Short Term Goals: Week 1:  OT Short Term Goal 1 (Week 1): Pt will don LB clothing with AE with S. OT Short Term Goal 2 (Week 1): Pt will be able to toilet with S. OT Short Term Goal 3 (Week 1): Pt will be able to tolerate standing at the sink to brush her teeth.  Skilled Therapeutic Interventions/Progress Updates:    Pt received in room and consented to OT tx. Pt requested to complete exercises in bed this session 2/2 fatigue from previous sessions and pain. Pt reports she just got pain medication. Pt instructed in orange theraband BUE HEP to increase UE strength and activity tolerance for ADLs. Pt instructed in elbow flexion/extension, shoulder internal/external rotation, shoulder press, and chest press all for 3x15 with min cuing for proper technique with good carryover. Pt required frequent rest breaks due to fatigue. Pt's daughter present during session, observed pt's bed mobility and functional mobility with RW to bathroom for toileting tasks. Pt able to complete toileting with functional transfers with supervision. Discussed home environment with pt, she reports she has no concerns about bathroom situation at home. After tx, pt left in bed in semifowler position with daughter at bedside with all needs met, bed alarm on.   Therapy Documentation Precautions:  Precautions Precautions: Back,Fall Restrictions Weight Bearing Restrictions: No    Vital Signs: Therapy Vitals Temp: 98.7 F (37.1 C) Pulse Rate: (!) 109 Resp: 17 BP: (!) 149/81 Patient Position (if appropriate): Lying Oxygen Therapy SpO2: 95 % O2 Device: Room Air Pain: Pain Assessment Pain Scale: 0-10 Pain Score: 8  Pain Type: Acute pain;Surgical pain Pain Location: Back Pain Orientation: Lower Pain  Descriptors / Indicators: Aching Pain Frequency: Constant Pain Onset: With Activity Patients Stated Pain Goal: 2 Pain Intervention(s): Medication (See eMAR) Multiple Pain Sites: No   Therapy/Group: Individual Therapy  Gisell Buehrle 03/28/2021, 5:02 PM

## 2021-03-28 NOTE — Progress Notes (Addendum)
PROGRESS NOTE   Subjective/Complaints:   Pt reports she just feels bad- "off" doesn't know why-  Also was scared to use Lovenox- since we didn't discuss it- explained it was actually similar medicine- however only 1x/day not 3x/day.  She will take it now.   Baclofen helps, not flexeril- reports she was confused yesterday about the two names and told me wrong.  I restarted Baclofen and stopped Flexeril 5 mg prn during day.   ROS:   Pt denies SOB, abd pain, CP, N/V/C/D, and vision changes   Objective:   No results found. Recent Labs    03/27/21 0546 03/28/21 0511  WBC 26.8* 25.7*  HGB 11.8* 11.9*  HCT 37.7 38.5  PLT 448* 434*   Recent Labs    03/27/21 0546  NA 140  K 3.7  CL 102  CO2 31  GLUCOSE 93  BUN 11  CREATININE 0.92  CALCIUM 9.4    Intake/Output Summary (Last 24 hours) at 03/28/2021 0850 Last data filed at 03/28/2021 0805 Gross per 24 hour  Intake 1440 ml  Output --  Net 1440 ml        Physical Exam: Vital Signs Blood pressure 116/66, pulse 86, temperature 97.8 F (36.6 C), resp. rate 18, height 5\' 2"  (1.575 m), weight 120.7 kg, last menstrual period 03/03/2021, SpO2 99 %, unknown if currently breastfeeding.    General: awake, alert, appropriate, sitting up in bed; slightly off color; sleepier than yesterday;  NAD HENT: conjugate gaze; oropharynx moist CV: regular rate; no JVD Pulmonary: CTA B/L; no W/R/R- good air movement GI: soft, NT, ND, (+)BS Psychiatric: appropriate; but more sleepy Neurological: alert; but sleepier Musculoskeletal:        General: No swelling or tenderness.     Cervical back: Normal range of motion and neck supple.     Comments: No edema or tenderness in extremities  Skin:    General: Skin is warm and dry.     Comments: Back incision intact with very few steristrips- looks great- no drainage or erythema - no change  Neurological:     Mental Status: She is alert.      Comments: Alert and oriented Motor: Bilateral upper extremities: 5/5 proximal distal Bilateral lower extremities: Flexion, knee extension 4 -/5, ankle dorsiflexion 5/5 Sensation intact light touch    No results found for this or any previous visit (from the past 48 hour(s)). No results found.   Assessment/Plan: 1. Functional deficits which require 3+ hours per day of interdisciplinary therapy in a comprehensive inpatient rehab setting.  Physiatrist is providing close team supervision and 24 hour management of active medical problems listed below.  Physiatrist and rehab team continue to assess barriers to discharge/monitor patient progress toward functional and medical goals  Care Tool:  Bathing    Body parts bathed by patient: Right arm,Left arm,Chest,Abdomen,Front perineal area,Right upper leg,Left upper leg,Face,Buttocks,Right lower leg,Left lower leg         Bathing assist Assist Level: Set up assist     Upper Body Dressing/Undressing Upper body dressing   What is the patient wearing?: Pull over shirt    Upper body assist Assist Level: Set up assist  Lower Body Dressing/Undressing Lower body dressing      What is the patient wearing?: Pants,Underwear/pull up     Lower body assist Assist for lower body dressing: Moderate Assistance - Patient 50 - 74%     Toileting Toileting    Toileting assist Assist for toileting: Minimal Assistance - Patient > 75%     Transfers Chair/bed transfer  Transfers assist  Chair/bed transfer activity did not occur: Safety/medical concerns (required use of RW)  Chair/bed transfer assist level: Minimal Assistance - Patient > 75%     Locomotion Ambulation   Ambulation assist   Ambulation activity did not occur: Safety/medical concerns (requires use of RW)  Assist level: Minimal Assistance - Patient > 75% Assistive device: Walker-rolling Max distance: 28'   Walk 10 feet activity   Assist  Walk 10 feet  activity did not occur: Safety/medical concerns  Assist level: Minimal Assistance - Patient > 75% Assistive device: Walker-rolling   Walk 50 feet activity   Assist Walk 50 feet with 2 turns activity did not occur: Safety/medical concerns         Walk 150 feet activity   Assist Walk 150 feet activity did not occur: Safety/medical concerns         Walk 10 feet on uneven surface  activity   Assist Walk 10 feet on uneven surfaces activity did not occur: Safety/medical concerns         Wheelchair     Assist Will patient use wheelchair at discharge?: No             Wheelchair 50 feet with 2 turns activity    Assist            Wheelchair 150 feet activity     Assist          Blood pressure 116/66, pulse 86, temperature 97.8 F (36.6 C), resp. rate 18, height 5\' 2"  (1.575 m), weight 120.7 kg, last menstrual period 03/03/2021, SpO2 99 %, unknown if currently breastfeeding.    Medical Problem List and Plan: 1.  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               pt may shower             -ELOS/Goals: 10-14 days/supervision/mod I           con't PT and OT 2.  Antithrombotics: -DVT/anticoagulation:  Pharmaceutical: Heparin 6/7- changed to lovenox- since 1x/day- d/w pt             -antiplatelet therapy: N/A 3. Pain Management: Continue Oxycodone prn             --On Gabapentin tid.              --will add baclofen tid prn. Continue flexeril at bedtime.   6/6- will add Flexeril 5 mg TID prn and change flexeril to 10 mg QHS- and monitor  6/7- pt actually liked Baclofen better than Flexeril- switched those meds and stopped Flexeril during day and gave Baclofen 5 mg TID prn.              Monitor with increased exertion 4. Mood: LCSW to follow for evaluation and support             -antipsychotic agents: N/AA 5. Neuropsych: This patient is capable of making decisions on her own  behalf. 6. Skin/Wound Care: Monitor incision for healing.             --  Routine pressure-relief measures. 7. Fluids/Electrolytes/Nutrition: Monitor intake/output.             CMP stable, monitor weekly.  8.  Elevated blood pressure:              --Better controlled, continue to Monitor 3 times daily.  Hydralazine prn for 24 hours.   6/6- BP slightly elevated- is doing better- con't regimen  6/7- BP well controlled 116/70s- con't regimen             Monitor with increased exertion 9. Neurogenic bladder: Incontinence has resolved but now with urgency/feeling of not emptying fully             PVRs ordered  6/6- PVRs are OK- but has also c/o bladder spasms-and WBC is elevated even more- will check U/A and Cx  10. Anxiety/ADD/ADHD/Depression: has been under lot of stress.              --Continue Vyvanse and Cymbalta             Team support 11. Leukocytosis: likely 2/2 steroids, afebrile, repeat Monday.  6/6- up to 26k- UTI?- checking today! Will recheck labs in AM  6/7- Pt has UTI- also up due to steroids, but is stable /slightly better- treating with Fosfomycin-  12. Ependymoma: pathology results discussed with patient. Patient would like to learn more about this tumor and discuss treatment plan with oncology as soon as possible.  6/7- spoke with Oncology and they will get back to me about plan.  13. UTI  6/7- Culture pending- since allergic to Cephalosporins -hives, and Bactrim- hives, called pharmacy and discussed- agreed on Fosfomycin- 1x and will wait for urine Cx.   I spent 35 minutes on total care- >50% coordination of care- d/w pharmacy x2 about  UTI with her allergies/treatment and Oncology-  LOS: 4 days A FACE TO FACE EVALUATION WAS PERFORMED  Shannon [name] 03/28/2021, 8:50 AM

## 2021-03-28 NOTE — Plan of Care (Signed)
  Problem: Consults Goal: RH SPINAL CORD INJURY PATIENT EDUCATION Description:  See Patient Education module for education specifics.  Outcome: Progressing Goal: Skin Care Protocol Initiated - if Braden Score 18 or less Description: If consults are not indicated, leave blank or document N/A Outcome: Progressing   Problem: RH SKIN INTEGRITY Goal: RH STG ABLE TO PERFORM INCISION/WOUND CARE W/ASSISTANCE Description: STG Able To Perform Incision/Wound Care With Supervision Assistance. Outcome: Progressing   Problem: RH SAFETY Goal: RH STG ADHERE TO SAFETY PRECAUTIONS W/ASSISTANCE/DEVICE Description: STG Adhere to Safety Precautions With Cues and Reminders. Outcome: Progressing   Problem: RH PAIN MANAGEMENT Goal: RH STG PAIN MANAGED AT OR BELOW PT'S PAIN GOAL Description: < 4 on a 0-10 pain scale. Outcome: Progressing   Problem: RH KNOWLEDGE DEFICIT SCI Goal: RH STG INCREASE KNOWLEDGE OF SELF CARE AFTER SCI Description: Patient will be able to demonstrate knowledge of medication management, pain management, skin/wound care with educational materials and handouts provided by staff independently at discharge. Outcome: Progressing

## 2021-03-29 NOTE — Progress Notes (Signed)
Occupational Therapy Session Note  Patient Details  Name: Shannon [name] MRN: [id number] Date of Birth: 1981-06-25  Today's Date: 03/29/2021 OT Individual Time: 6606-3016 OT Individual Time Calculation (min): 57 min    Short Term Goals: Week 1:  OT Short Term Goal 1 (Week 1): Pt will don LB clothing with AE with S. OT Short Term Goal 2 (Week 1): Pt will be able to toilet with S. OT Short Term Goal 3 (Week 1): Pt will be able to tolerate standing at the sink to brush her teeth.   Skilled Therapeutic Interventions/Progress Updates:    Pt greeted at time of session semireclined in bed, no pain. Wanting to take a shower this am, OT retrieved waterproof dressing for back incision and applied to back. Supine > sit Supervision and ambulated to bathroom same manner with RW, transferred to shower bench CGA/close supervision and able to adhere to back precautions without cues for doffing clothing with reacher. Pt performed UB/LB bathing with LHS for precautions with set up/distant supervision. Dried off in same manner before walking back to bed CGA/close supervision with RW. Pt performed UB dress set up and underwear/pants with Supervision for safety but pt able to thread with reacher without cues. Returned to semireclined position and alarm on call bell in reach.   Therapy Documentation Precautions:  Precautions Precautions: Back,Fall Restrictions Weight Bearing Restrictions: No    Therapy/Group: Individual Therapy  [name] 03/29/2021, 7:13 AM

## 2021-03-29 NOTE — Progress Notes (Signed)
Physical Therapy Session Note  Patient Details  Name: Shannon [name] MRN: [id number] Date of Birth: 1981-09-20  Today's Date: 03/29/2021 PT Individual Time: 1400-1510 PT Individual Time Calculation (min): 70 min   Short Term Goals: Week 1:  PT Short Term Goal 1 (Week 1): Pt wil perform supine<>sit CGA PT Short Term Goal 2 (Week 1): Sit<>stand and stand pivot transfers using LRAD with CGA PT Short Term Goal 3 (Week 1): Pt will ambulate at least 59ft using LRAD with CGA PT Short Term Goal 4 (Week 1): Pt will ascend/descend 4 steps using B HRs with mod assist  Skilled Therapeutic Interventions/Progress Updates:    Pt received seated in bed, agreeable to PT session. No complaints of pain at rest, reports taking muscle relaxer prior to session but no pain medication this date. Pt does have onset of muscular tightness in pain in R mid-lower back that increases with activity. Musclar tightness is palpable when patient at rest with notable increase following activity, performed soft tissue massage and trigger point release, pt reports relief of pain. Demonstrated use of theracane for increased independence with treating muscle tightness. Pt reports improvement in symptoms with use of theracane. Left theracane in patient room for use during rehab stay and instructed patient on where it can be purchased online. Supine to sit with Supervision with HOB elevated and use of bedrail. Sit to stand with Supervision to RW throughout session. Ambulation up to 300 ft with RW and Supervision this date, significantly improved gait speed, tolerance for gait training, and upright posture noted this date. Ascend/descend 12 x 6" stairs with 1-2 handrails (forwards and laterally) with CGA, step-to gait pattern. Sit to stand from low, pliable surface of couch with Supervision and RW. Sit to/from supine on real bed in therapy apartment at Supervision level, cues for logroll technique. Static standing balance performing ball toss  against rebounder, 3 x 30 reps to fatigue with no UE support and CGA for balance. Trial gait with rail in hallway progressing to no AD with CGA for balance x 100 ft. Pt exhibits significant improvement in pain and overall performance this date. Pt left seated in w/c in room with needs in reach at end of session.  Therapy Documentation Precautions:  Precautions Precautions: Back,Fall Restrictions Weight Bearing Restrictions: No   Therapy/Group: Individual Therapy   Excell Seltzer, PT, DPT, CSRS  03/29/2021, 5:17 PM

## 2021-03-29 NOTE — Progress Notes (Signed)
Inpatient Rehabilitation Care Coordinator Assessment and Plan Patient Details  Name: Shannon [name] MRN: [id number] Date of Birth: 1981-10-10  Today's Date: 03/29/2021  Hospital Problems: Principal Problem:   Lumbar radiculitis  Past Medical History:  Past Medical History:  Diagnosis Date  . ADD (attention deficit disorder)   . Anemia   . Anxiety   . Asthma   . Back pain   . Bilateral swelling of feet   . Chest pain   . Constipation   . Depression   . Hyperlipidemia   . Hypertension    Preeclampsia post delivery  . Irregular heart beat   . Joint pain   . Multiple food allergies   . Obesity   . Palpitations in pediatric patient   . PONV (postoperative nausea and vomiting)    PONV; Also emotional upon waking up  . Shortness of breath   . Vitamin D deficiency    Past Surgical History:  Past Surgical History:  Procedure Laterality Date  . DILATATION & CURRETTAGE/HYSTEROSCOPY WITH RESECTOCOPE N/A 02/03/2014   Procedure: DILATATION & CURETTAGE/HYSTEROSCOPY ;  Surgeon: Delice Lesch, MD;  Location: [facility];  Service: Gynecology;  Laterality: N/A;  . DILATION AND CURETTAGE OF UTERUS    . LAMINECTOMY N/A 03/17/2021   Procedure: Laminectomy - Lumbar Four-Lumbar Five for Intradural mass;  Surgeon: Kary Kos, MD;  Location: [facility];  Service: Neurosurgery;  Laterality: N/A;  3C  . MYOMECTOMY N/A 02/03/2014   Procedure: MYOMECTOMY ;  Surgeon: Delice Lesch, MD;  Location: [facility];  Service: Gynecology;  Laterality: N/A;  . SALPINGECTOMY    . TONSILLECTOMY    . tonsils and adnoids removed    . TUBAL LIGATION N/A 08/19/2015   Procedure: POST PARTUM TUBAL LIGATION;  Surgeon: Everett Graff, MD;  Location: [facility];  Service: Gynecology;  Laterality: N/A;  . WISDOM TOOTH EXTRACTION     Social History:  reports that she has never smoked. She has never used smokeless tobacco. She reports that she does not drink alcohol and does not use drugs.  Family / Support Systems Marital Status:  Married How Long?: 7 years Patient Roles: Spouse,Parent Spouse/Significant Other: [name] (husband) Children: 3 children- 42 y.o. dtr, 26 y.o, dtr, and 100 y.o. son Other Supports: parents, in-laws, co-workers, Social research officer, government. Anticipated Caregiver: Husband, parents, and in-laws Ability/Limitations of Caregiver: Husband works 3rd shift; Oldest daughter works in [place] and her schedule recently changes so she is unable to assist as planned. She is available PRN. Caregiver Availability: 24/7 Family Dynamics: Pt lives with husband and 3 children.  Social History Preferred language: English Religion: None Cultural Background: Pt works as a Product manager; preciously worked as a Agricultural engineer for 6 years. Education: Masters Read: Yes Write: Yes Employment Status: Employed Name of Employer: Engineer, materials of Employment:  (1.5 years) Return to Work Plans: TBD Public relations account executive Issues: Denies Guardian/Conservator: N/A   Abuse/Neglect Abuse/Neglect Assessment Can Be Completed: Yes Physical Abuse: Denies Verbal Abuse: Denies Sexual Abuse: Denies Exploitation of patient/patient's resources: Denies Self-Neglect: Denies  Emotional Status Pt's affect, behavior and adjustment status: Pt emotional as she is processing her current condition, and misses her children. Recent Psychosocial Issues: depression Psychiatric History: Pt admits to depression, anxiety, and ADHD. Takes Vyvanse and Cymbalata. [facility] (Crystal meds) and counseling as well. Upcoming appt scheduled for 6/14 12:45pm-2pm. Substance Abuse History: Denies  Patient / Family Perceptions, Expectations & Goals Pt/Family understanding of illness &  functional limitations: Pt has ageneral understanding of her care needs Premorbid pt/family roles/activities: Independent Anticipated changes in roles/activities/participation: Assistance with  ADLs/IADLs Pt/family expectations/goals: Pt goal is to work on: "walking, standing up straight; independence to go to the bathroom on own, muscles spams come and go."  [employer]: None Premorbid Home Care/DME Agencies: None Transportation available at discharge: TBD Resource referrals recommended: [facility]  Discharge Planning Living Arrangements: Spouse/significant other,Children Support Systems: Spouse/significant other,Children,Parent,Other relatives Type of Residence: Private residence Insurance underwriter Resources: Multimedia programmer (specify) Nurse, mental health) Financial Resources: Employment Museum/gallery curator Screen Referred: No Living Expenses: Medical laboratory scientific officer Management: Spouse,Patient Does the patient have any problems obtaining your medications?: No Home Management: Both pt and husband managed home care needs Patient/Family Preliminary Plans: TBD Care Coordinator Anticipated Follow Up Needs: HH/OP Expected length of stay: 14 days  Clinical Impression SW met with pt in room to introduce self, explain role, and discuss discharge process. Pt is not a English as a second language teacher. No HCPOA but intends to put in place at time of discharge. No DME.   Kingsley Herandez A Ishmel Acevedo 03/29/2021, 10:04 AM

## 2021-03-29 NOTE — Plan of Care (Signed)
  Problem: RH Balance Goal: LTG Patient will maintain dynamic standing balance (PT) Description: LTG:  Patient will maintain dynamic standing balance with assistance during mobility activities (PT) Flowsheets (Taken 03/29/2021 1737) LTG: Pt will maintain dynamic standing balance during mobility activities with:: (upgrade due to progress) Independent with assistive device  Note: Upgrade due to progress   Problem: Sit to Stand Goal: LTG:  Patient will perform sit to stand with assistance level (PT) Description: LTG:  Patient will perform sit to stand with assistance level (PT) Flowsheets (Taken 03/29/2021 1737) LTG: PT will perform sit to stand in preparation for functional mobility with assistance level: (Upgrade due to progress) Independent with assistive device Note: Upgrade due to progress   Problem: RH Bed to Chair Transfers Goal: LTG Patient will perform bed/chair transfers w/assist (PT) Description: LTG: Patient will perform bed to chair transfers with assistance (PT). Flowsheets (Taken 03/29/2021 1737) LTG: Pt will perform Bed to Chair Transfers with assistance level: (Upgrade due to progress) Independent with assistive device  Note: Upgrade due to progress   Problem: RH Car Transfers Goal: LTG Patient will perform car transfers with assist (PT) Description: LTG: Patient will perform car transfers with assistance (PT). Flowsheets (Taken 03/29/2021 1737) LTG: Pt will perform car transfers with assist:: (Upgrade due to progress) Independent with assistive device  Note: Upgrade due to progress   Problem: RH Ambulation Goal: LTG Patient will ambulate in controlled environment (PT) Description: LTG: Patient will ambulate in a controlled environment, # of feet with assistance (PT). Flowsheets (Taken 03/29/2021 1737) LTG: Pt will ambulate in controlled environ  assist needed:: (Upgrade due to progress) Independent with assistive device Note: Upgrade due to progress Goal: LTG Patient will  ambulate in home environment (PT) Description: LTG: Patient will ambulate in home environment, # of feet with assistance (PT). Flowsheets (Taken 03/29/2021 1737) LTG: Pt will ambulate in home environ  assist needed:: (Upgrade due to progress) Independent with assistive device Note: Upgrade due to progress   Problem: RH Stairs Goal: LTG Patient will ambulate up and down stairs w/assist (PT) Description: LTG: Patient will ambulate up and down # of stairs with assistance (PT) Flowsheets (Taken 03/29/2021 1737) LTG: Pt will ambulate up/down stairs assist needed:: (Upgrade due to progress) Supervision/Verbal cueing Note: Upgrade due to progress

## 2021-03-29 NOTE — Progress Notes (Signed)
Physical Therapy Session Note  Patient Details  Name: Shannon [name] MRN: [id number] Date of Birth: November 10, 1980  Today's Date: 03/29/2021 PT Individual Time: 7353-2992 PT Individual Time Calculation (min): 44 min   Short Term Goals: Week 1:  PT Short Term Goal 1 (Week 1): Pt wil perform supine<>sit CGA PT Short Term Goal 2 (Week 1): Sit<>stand and stand pivot transfers using LRAD with CGA PT Short Term Goal 3 (Week 1): Pt will ambulate at least 60ft using LRAD with CGA PT Short Term Goal 4 (Week 1): Pt will ascend/descend 4 steps using B HRs with mod assist  Skilled Therapeutic Interventions/Progress Updates: Pt presents sitting in bed and agreeable to therapy.  Verbal cues for log roll to right w/ HOB elevated and siderail w/ supervision.  Pt transfers sit to stand from multiple surfaces, including couch, arm chair, w/c and bed w/ supervision.  Education given for improved initiation from lower surfaces.  Pt amb multiple trials w/ RW up to 120' per trial.  Pt required occ. verbal cues for posture and walker management.  Pt amb w/ noted sway, per pt, 2/2 feeling of pulling of R LB but to the left.  Pt returned to room and transferred sit to sidelying through reverse log roll technique w/ bed flat and using side rail w/ CGA for LEs.  Bed alarm on and all needs in reach.  Dtr present in room.     Therapy Documentation Precautions:  Precautions Precautions: Back,Fall Restrictions Weight Bearing Restrictions: No General:   Vital Signs:   Pain: 4/10 pre-therapy w/ pain meds given, increased to 6/10 w/ activity "feels like pulling" Pain Assessment Pain Scale: 0-10 Pain Score: 0-No pain Mobility:      Therapy/Group: Individual Therapy  Ladoris Gene 03/29/2021, 11:00 AM

## 2021-03-29 NOTE — Progress Notes (Signed)
Patient ID: Shannon [name], female   DOB: 1981-04-09, 40 y.o.   MRN: [id number]  SW returned phone call to patient husband [name] [phone]) to schedule family edu. Friday (6/10) 10am-12pm with her parents, and Monday (6/13) 1pm-3pm.   Loralee Pacas, MSW, [place] Office: [phone] Cell: [phone] Fax: [phone]

## 2021-03-29 NOTE — Progress Notes (Signed)
PROGRESS NOTE   Subjective/Complaints:   Pt reports feeling MUCH better today- more like herself.  Also happy with how well progressing daily.  Abd feels tight initially- when saw pt again- had passed a large, looser BM and reports abd feels much less tight/better.   Asking to have cymbalta and Vyvanse at same time-    ROS:   Pt denies SOB, abd pain, CP, N/V/C/D, and vision changes     Objective:   No results found. Recent Labs    03/27/21 0546 03/28/21 0511  WBC 26.8* 25.7*  HGB 11.8* 11.9*  HCT 37.7 38.5  PLT 448* 434*   Recent Labs    03/27/21 0546  NA 140  K 3.7  CL 102  CO2 31  GLUCOSE 93  BUN 11  CREATININE 0.92  CALCIUM 9.4    Intake/Output Summary (Last 24 hours) at 03/29/2021 0828 Last data filed at 03/29/2021 0700 Gross per 24 hour  Intake 850 ml  Output --  Net 850 ml        Physical Exam: Vital Signs Blood pressure 126/88, pulse 90, temperature 98.2 F (36.8 C), temperature source Oral, resp. rate 16, height 5\' 2"  (1.575 m), weight 120.7 kg, last menstrual period 03/03/2021, SpO2 98 %, unknown if currently breastfeeding.     General: awake, alert, appropriate, sitting up in bed; NAD HENT: conjugate gaze; oropharynx moist CV: regular rate; no JVD Pulmonary: CTA B/L; no W/R/R- good air movement GI: soft, but feels somewhat tight- not actually firm; NT, somewhat distended?; hyperactive BS Psychiatric: appropriate; bright affect Neurological: Ox3 Musculoskeletal:        General: No swelling or tenderness.     Cervical back: Normal range of motion and neck supple.     Comments: No edema or tenderness in extremities  Skin:    General: Skin is warm and dry.     Comments: Back incision intact with very few steristrips- looks great- no drainage or erythema - no change  Neurological:     Mental Status: She is alert.     Comments: Alert and oriented Motor: Bilateral upper extremities:  5/5 proximal distal Bilateral lower extremities: Flexion, knee extension 4 -/5, ankle dorsiflexion 5/5 Sensation intact light touch    No results found for this or any previous visit (from the past 48 hour(s)). No results found.   Assessment/Plan: 1. Functional deficits which require 3+ hours per day of interdisciplinary therapy in a comprehensive inpatient rehab setting.  Physiatrist is providing close team supervision and 24 hour management of active medical problems listed below.  Physiatrist and rehab team continue to assess barriers to discharge/monitor patient progress toward functional and medical goals  Care Tool:  Bathing    Body parts bathed by patient: Right arm,Left arm,Chest,Abdomen,Front perineal area,Right upper leg,Left upper leg,Face,Buttocks,Right lower leg,Left lower leg         Bathing assist Assist Level: Set up assist     Upper Body Dressing/Undressing Upper body dressing   What is the patient wearing?: Pull over shirt    Upper body assist Assist Level: Set up assist    Lower Body Dressing/Undressing Lower body dressing      What is  the patient wearing?: Pants,Underwear/pull up     Lower body assist Assist for lower body dressing: Moderate Assistance - Patient 50 - 74%     Toileting Toileting    Toileting assist Assist for toileting: Minimal Assistance - Patient > 75%     Transfers Chair/bed transfer  Transfers assist  Chair/bed transfer activity did not occur: Safety/medical concerns (required use of RW)  Chair/bed transfer assist level: Contact Guard/Touching assist     Locomotion Ambulation   Ambulation assist   Ambulation activity did not occur: Safety/medical concerns (requires use of RW)  Assist level: Minimal Assistance - Patient > 75% Assistive device: Walker-rolling Max distance: 65'   Walk 10 feet activity   Assist  Walk 10 feet activity did not occur: Safety/medical concerns  Assist level: Minimal  Assistance - Patient > 75% Assistive device: Walker-rolling   Walk 50 feet activity   Assist Walk 50 feet with 2 turns activity did not occur: Safety/medical concerns  Assist level: Minimal Assistance - Patient > 75% Assistive device: Walker-rolling    Walk 150 feet activity   Assist Walk 150 feet activity did not occur: Safety/medical concerns         Walk 10 feet on uneven surface  activity   Assist Walk 10 feet on uneven surfaces activity did not occur: Safety/medical concerns         Wheelchair     Assist Will patient use wheelchair at discharge?: No             Wheelchair 50 feet with 2 turns activity    Assist            Wheelchair 150 feet activity     Assist          Blood pressure 126/88, pulse 90, temperature 98.2 F (36.8 C), temperature source Oral, resp. rate 16, height 5\' 2"  (1.575 m), weight 120.7 kg, last menstrual period 03/03/2021, SpO2 98 %, unknown if currently breastfeeding.    Medical Problem List and Plan: 1.  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               pt may shower             -ELOS/Goals: 10-14 days/supervision/mod I           con't PT and OT 2.  Antithrombotics: -DVT/anticoagulation:  Pharmaceutical: Heparin 6/7- changed to lovenox- since 1x/day- d/w pt             -antiplatelet therapy: N/A 3. Pain Management: Continue Oxycodone prn             --On Gabapentin tid.              --will add baclofen tid prn. Continue flexeril at bedtime.   6/6- will add Flexeril 5 mg TID prn and change flexeril to 10 mg QHS- and monitor  6/7- pt actually liked Baclofen better than Flexeril- switched those meds and stopped Flexeril during day and gave Baclofen 5 mg TID prn.  6/8- pain better controlled with baclofen- con't regimen              Monitor with increased exertion 4. Mood: LCSW to follow for evaluation and support             -antipsychotic  agents: N/AA 5. Neuropsych: This patient is capable of making decisions on her own behalf. 6. Skin/Wound Care: Monitor incision for healing.             --  Routine pressure-relief measures. 7. Fluids/Electrolytes/Nutrition: Monitor intake/output.             CMP stable, monitor weekly.  8.  Elevated blood pressure:              --Better controlled, continue to Monitor 3 times daily.  Hydralazine prn for 24 hours.   6/8- pt's BP well controlled- con't to monitor and con't regimen             Monitor with increased exertion 9. Neurogenic bladder: Incontinence has resolved but now with urgency/feeling of not emptying fully             PVRs ordered  6/6- PVRs are OK- but has also c/o bladder spasms-and WBC is elevated even more- will check U/A and Cx  10. Anxiety/ADD/ADHD/Depression: has been under lot of stress.              --Continue Vyvanse and Cymbalta  6/8- change time to give together             Team support 11. Leukocytosis: likely 2/2 steroids, afebrile, repeat Monday.  6/6- up to 26k- UTI?- checking today! Will recheck labs in AM  6/7- Pt has UTI- also up due to steroids, but is stable /slightly better- treating with Fosfomycin-   6/8- Cx is multiple species, but pt feeling much better- will check CBC tomorrow, to make sure it's coming down and follow clinically 12. Ependymoma: pathology results discussed with patient. Patient would like to learn more about this tumor and discuss treatment plan with oncology as soon as possible.  6/7- spoke with Oncology and they will get back to me about plan.  13. UTI  6/7- Culture pending- since allergic to Cephalosporins -hives, and Bactrim- hives, called pharmacy and discussed- agreed on Fosfomycin- 1x and will wait for urine Cx.   6/8- s/p fosfomycin- Cx shows multiple species- as above.  14. Constipation  6/8- had a good BM today- abd tightness resolved- will con't bowel regimen and monitor   LOS: 5 days A FACE TO FACE EVALUATION WAS  PERFORMED  Shannon [name] 03/29/2021, 8:28 AM

## 2021-03-30 ENCOUNTER — Telehealth: Payer: Self-pay | Admitting: Radiation Therapy

## 2021-03-30 LAB — CBC WITH DIFFERENTIAL/PLATELET
Abs Immature Granulocytes: 0 10*3/uL (ref 0.00–0.07)
Basophils Absolute: 0.3 10*3/uL — ABNORMAL HIGH (ref 0.0–0.1)
Basophils Relative: 1 %
Eosinophils Absolute: 0.3 10*3/uL (ref 0.0–0.5)
Eosinophils Relative: 1 %
HCT: 36.7 % (ref 36.0–46.0)
Hemoglobin: 11.6 g/dL — ABNORMAL LOW (ref 12.0–15.0)
Lymphocytes Relative: 36 %
Lymphs Abs: 9.3 10*3/uL — ABNORMAL HIGH (ref 0.7–4.0)
MCH: 25.8 pg — ABNORMAL LOW (ref 26.0–34.0)
MCHC: 31.6 g/dL (ref 30.0–36.0)
MCV: 81.7 fL (ref 80.0–100.0)
Monocytes Absolute: 1.8 10*3/uL — ABNORMAL HIGH (ref 0.1–1.0)
Monocytes Relative: 7 %
Neutro Abs: 14.1 10*3/uL — ABNORMAL HIGH (ref 1.7–7.7)
Neutrophils Relative %: 55 %
Platelets: 375 10*3/uL (ref 150–400)
RBC: 4.49 MIL/uL (ref 3.87–5.11)
RDW: 14.2 % (ref 11.5–15.5)
WBC: 25.7 10*3/uL — ABNORMAL HIGH (ref 4.0–10.5)
nRBC: 0 /100 WBC
nRBC: 0.1 % (ref 0.0–0.2)

## 2021-03-30 LAB — BASIC METABOLIC PANEL
Anion gap: 8 (ref 5–15)
BUN: 13 mg/dL (ref 6–20)
CO2: 30 mmol/L (ref 22–32)
Calcium: 9.2 mg/dL (ref 8.9–10.3)
Chloride: 100 mmol/L (ref 98–111)
Creatinine, Ser: 0.97 mg/dL (ref 0.44–1.00)
GFR, Estimated: >60 mL/min (ref >60)
Glucose, Bld: 94 mg/dL (ref 70–99)
Potassium: 4 mmol/L (ref 3.5–5.1)
Sodium: 138 mmol/L (ref 135–145)

## 2021-03-30 NOTE — Progress Notes (Signed)
PROGRESS NOTE   Subjective/Complaints:   Cx came back Multiple species. Went outside with therapy yesterday.  Feels a lot better.   Spasms still there, but better in back- not in legs.     ROS:   Pt denies SOB, abd pain, CP, N/V/C/D, and vision changes     Objective:   No results found. Recent Labs    03/28/21 0511 03/30/21 0503  WBC 25.7* 25.7*  HGB 11.9* 11.6*  HCT 38.5 36.7  PLT 434* 375   Recent Labs    03/30/21 0503  NA 138  K 4.0  CL 100  CO2 30  GLUCOSE 94  BUN 13  CREATININE 0.97  CALCIUM 9.2    Intake/Output Summary (Last 24 hours) at 03/30/2021 0837 Last data filed at 03/29/2021 1715 Gross per 24 hour  Intake 714 ml  Output --  Net 714 ml        Physical Exam: Vital Signs Blood pressure (!) 147/81, pulse 99, temperature 98.4 F (36.9 C), temperature source Oral, resp. rate 18, height 5\' 2"  (1.575 m), weight 120.7 kg, last menstrual period 03/03/2021, SpO2 100 %, unknown if currently breastfeeding.      General: awake, alert, appropriate, sitting up in bed; NAD HENT: conjugate gaze; oropharynx moist CV: regular rate but borderline tachycardic; no JVD Pulmonary: CTA B/L; no W/R/R- good air movement GI: soft, NT, ND, (+)BS- not firm at all Psychiatric: appropriate; bright affect Neurological: Ox3; 5/5 in DF and PF B/L; no increased tone; spasms seen in LEs Musculoskeletal:        General: No swelling or tenderness.     Cervical back: Normal range of motion and neck supple.     Comments: No edema or tenderness in extremities  Skin:    General: Skin is warm and dry.     Comments: Back incision intact with very few steristrips- looks great- no drainage or erythema - looks good.  Neurological:     Mental Status: She is alert.     Comments: Alert and oriented Motor: Bilateral upper extremities: 5/5 proximal distal Bilateral lower extremities: Flexion, knee extension 4 -/5, ankle  dorsiflexion 5/5 Sensation intact light touch     No results found for this or any previous visit (from the past 48 hour(s)). No results found.   Assessment/Plan: 1. Functional deficits which require 3+ hours per day of interdisciplinary therapy in a comprehensive inpatient rehab setting. Physiatrist is providing close team supervision and 24 hour management of active medical problems listed below. Physiatrist and rehab team continue to assess barriers to discharge/monitor patient progress toward functional and medical goals  Care Tool:  Bathing    Body parts bathed by patient: Right arm, Left arm, Chest, Abdomen, Front perineal area, Right upper leg, Left upper leg, Face, Buttocks, Right lower leg, Left lower leg         Bathing assist Assist Level: Set up assist     Upper Body Dressing/Undressing Upper body dressing   What is the patient wearing?: Pull over shirt    Upper body assist Assist Level: Set up assist    Lower Body Dressing/Undressing Lower body dressing      What is  the patient wearing?: Pants, Underwear/pull up     Lower body assist Assist for lower body dressing: Supervision/Verbal cueing     Toileting Toileting    Toileting assist Assist for toileting: Minimal Assistance - Patient > 75%     Transfers Chair/bed transfer  Transfers assist  Chair/bed transfer activity did not occur: Safety/medical concerns (required use of RW)  Chair/bed transfer assist level: Supervision/Verbal cueing     Locomotion Ambulation   Ambulation assist   Ambulation activity did not occur: Safety/medical concerns (requires use of RW)  Assist level: Supervision/Verbal cueing Assistive device: Walker-rolling Max distance: 300'   Walk 10 feet activity   Assist  Walk 10 feet activity did not occur: Safety/medical concerns  Assist level: Supervision/Verbal cueing Assistive device: Walker-rolling   Walk 50 feet activity   Assist Walk 50 feet with 2  turns activity did not occur: Safety/medical concerns  Assist level: Supervision/Verbal cueing Assistive device: Walker-rolling    Walk 150 feet activity   Assist Walk 150 feet activity did not occur: Safety/medical concerns  Assist level: Supervision/Verbal cueing Assistive device: Walker-rolling    Walk 10 feet on uneven surface  activity   Assist Walk 10 feet on uneven surfaces activity did not occur: Safety/medical concerns   Assist level: Contact Guard/Touching assist Assistive device: Aeronautical engineer Will patient use wheelchair at discharge?: No             Wheelchair 50 feet with 2 turns activity    Assist            Wheelchair 150 feet activity     Assist          Blood pressure (!) 147/81, pulse 99, temperature 98.4 F (36.9 C), temperature source Oral, resp. rate 18, height 5\' 2"  (1.575 m), weight 120.7 kg, last menstrual period 03/03/2021, SpO2 100 %, unknown if currently breastfeeding.    Medical Problem List and Plan: 1.  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               pt may shower             -ELOS/Goals: 10-14 days/supervision/mod I           con't PT and OT- thinking about moving d/c from 6/16 to 6/14.  2.  Antithrombotics: -DVT/anticoagulation:  Pharmaceutical: Heparin 6/7- changed to lovenox- since 1x/day- d/w pt             -antiplatelet therapy: N/A 3. Pain Management: Continue Oxycodone prn             --On Gabapentin tid.              --will add baclofen tid prn. Continue flexeril at bedtime.   6/6- will add Flexeril 5 mg TID prn and change flexeril to 10 mg QHS- and monitor  6/7- pt actually liked Baclofen better than Flexeril- switched those meds and stopped Flexeril during day and gave Baclofen 5 mg TID prn.  6/9- pain overall controlled- con't regimen             Monitor with increased exertion 4. Mood: LCSW to follow for  evaluation and support             -antipsychotic agents: N/AA 5. Neuropsych: This patient is capable of making decisions on her own behalf. 6. Skin/Wound Care: Monitor incision for healing.             --  Routine pressure-relief measures. 7. Fluids/Electrolytes/Nutrition: Monitor intake/output.             CMP stable, monitor weekly.  8.  Elevated blood pressure:              --Better controlled, continue to Monitor 3 times daily.  Hydralazine prn for 24 hours.   6/9- BP a little elevated- but usually controlled- con't regimen             Monitor with increased exertion 9. Neurogenic bladder: Incontinence has resolved but now with urgency/feeling of not emptying fully             PVRs ordered  6/6- PVRs are OK- but has also c/o bladder spasms-and WBC is elevated even more- will check U/A and Cx   6/9- tx'd with fosfomycin.  10. Anxiety/ADD/ADHD/Depression: has been under lot of stress.              --Continue Vyvanse and Cymbalta  6/8- change time to give together             Team support 11. Leukocytosis: likely 2/2 steroids, afebrile, repeat Monday.  6/6- up to 26k- UTI?- checking today! Will recheck labs in AM  6/7- Pt has UTI- also up due to steroids, but is stable /slightly better- treating with Fosfomycin-   6/8- Cx is multiple species, but pt feeling much better- will check CBC tomorrow, to make sure it's coming down and follow clinically  6/9- WBC still 25k- however no left shift- Neutrophils are 55- not elevated.  12. Ependymoma: pathology results discussed with patient. Patient would like to learn more about this tumor and discuss treatment plan with oncology as soon as possible.  6/7- spoke with Oncology and they will get back to me about plan.   6/9- they plan on seeing her after d/c.  13. UTI  6/7- Culture pending- since allergic to Cephalosporins -hives, and Bactrim- hives, called pharmacy and discussed- agreed on Fosfomycin- 1x and will wait for urine Cx.   6/8- s/p  fosfomycin- Cx shows multiple species- as above.  14. Constipation  6/8- had a good BM today- abd tightness resolved- will con't bowel regimen and monitor  6/9- doing better- con't regimen   LOS: 6 days A FACE TO FACE EVALUATION WAS PERFORMED  Shannon [name] 03/30/2021, 8:37 AM

## 2021-03-30 NOTE — Telephone Encounter (Signed)
Left MessageCommunicated  - Left a detailed message about her upcoming appointment with Dr. Mickeal Skinner on 6/23. Included my contact information for her to call me back with any questions.   Mont Dutton R.T.(R)(T)  Radiation Special Procedures Navigator

## 2021-03-30 NOTE — Evaluation (Signed)
Recreational Therapy Assessment and Plan  Patient Details  Name: Shannon [name] MRN: [id number] Date of Birth: 1981/09/01 Today's Date: 03/30/2021  Rehab Potential:  Good ELOS:   d/c 6/16  Assessment   Hospital Problem: Principal Problem:   Lumbar radiculitis     Past Medical History:      Past Medical History:  Diagnosis Date   ADD (attention deficit disorder)     Anemia     Anxiety     Asthma     Back pain     Bilateral swelling of feet     Chest pain     Constipation     Depression     Hyperlipidemia     Hypertension      Preeclampsia post delivery   Irregular heart beat     Joint pain     Multiple food allergies     Obesity     Palpitations in pediatric patient     PONV (postoperative nausea and vomiting)      PONV; Also emotional upon waking up   Shortness of breath     Vitamin D deficiency      Past Surgical History:       Past Surgical History:  Procedure Laterality Date   DILATATION & CURRETTAGE/HYSTEROSCOPY WITH RESECTOCOPE N/A 02/03/2014    Procedure: DILATATION & CURETTAGE/HYSTEROSCOPY ;  Surgeon: Delice Lesch, MD;  Location: [facility];  Service: Gynecology;  Laterality: N/A;   DILATION AND CURETTAGE OF UTERUS       LAMINECTOMY N/A 03/17/2021    Procedure: Laminectomy - Lumbar Four-Lumbar Five for Intradural mass;  Surgeon: Kary Kos, MD;  Location: [facility];  Service: Neurosurgery;  Laterality: N/A;  3C   MYOMECTOMY N/A 02/03/2014    Procedure: MYOMECTOMY ;  Surgeon: Delice Lesch, MD;  Location: [facility];  Service: Gynecology;  Laterality: N/A;   SALPINGECTOMY       TONSILLECTOMY       tonsils and adnoids removed       TUBAL LIGATION N/A 08/19/2015    Procedure: POST PARTUM TUBAL LIGATION;  Surgeon: Everett Graff, MD;  Location: [facility];  Service: Gynecology;  Laterality: N/A;   WISDOM TOOTH EXTRACTION          Assessment & Plan Clinical Impression: Patient is a 40 y.o. year old female with history of ADD, preeclampsia, depression, obesity, back pain  with numbness and tingling BLE/feet, spasms with involuntary spasms, falls and bladder incontinence with progressive symptoms for past 2 months.  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  History taken from chart review and patient.  She was admitted on 03/17/2021 for decompressive L4 and L5 laminectomy for tumor excision with Dr. Saintclair Halsted.  [facility] course complicated by lower extremity spasms, tachycardia as well as dysesthesias.  She was limited by pain and Medrol Dosepak was started on 03/21/2021 2 and on 03/25/2021.  Therapy ongoing with some improvement in total body spasms--still worse at nights, ongoing RLE weakness, sensory deficits and weakness. CIR recommended due to functional decline. Patient transferred to CIR on 03/24/2021 .  Pt presents with decreased activity tolerance, decreased functional mobility, decreased balance and difficulty maintaining precautions.  Met with pt today to discuss leisure interests, activity analysis/modifications, coping strategies, and community reintegration.  Pt is optimistic about recovery and excited about upcoming discharge home. Extensive discussion about emotional health and its effect on physical health.  Discussed purpose of community reintegration including potential goals and pt agreeable to participate in an outing next  week.  Plan   Min 1 TR session for community reintegration  Recommendations for other services: None   Discharge Criteria: Patient will be discharged from TR if patient refuses treatment 3 consecutive times without medical reason.  If treatment goals not met, if there is a change in medical status, if patient makes no progress towards goals or if patient is discharged from hospital.  The above assessment, treatment plan, treatment alternatives and goals were discussed and mutually agreed upon: by patient  [place] 03/30/2021, 3:36 PM

## 2021-03-30 NOTE — Progress Notes (Signed)
Occupational Therapy Session Note  Patient Details  Name: Shannon [name] MRN: [id number] Date of Birth: [birth year]  Today's Date: 03/30/2021 OT Individual Time: 0805-0900 OT Individual Time Calculation (min): 55 min    Short Term Goals: Week 1:  OT Short Term Goal 1 (Week 1): Pt will don LB clothing with AE with S. OT Short Term Goal 2 (Week 1): Pt will be able to toilet with S. OT Short Term Goal 3 (Week 1): Pt will be able to tolerate standing at the sink to brush her teeth.  Skilled Therapeutic Interventions/Progress Updates:     Pt received in bed with 4/10 back pain. Static standing per pt request and hot water in shower utilized to improve pain. ADL:  Pt completes bathing with set up sit to stand from TTB in walk in shower Pt completes UB dressing with set up. Pt gathers clothing from dresser next to bed and placed on bed for after shower Pt completes LB dressing with set up using reacher with no cuing. A to bring reacher to pt from bathroom. Pt completes footwear with set up to slide on slip on shoes Pt completes toileting with set up at sit to stand level. Good adherence to back precautions Pt completes toileting transfer with supervision at ambulatory level Pt completes shower/Tub transfer with supervision into/out of bathroom at amb level with no AD  Edu pt about drum group opportunities and pt excited for group! Pt left at end of session in w/c with exit alarm on, call light in reach and all needs met   Therapy Documentation Precautions:  Precautions Precautions: Back, Fall Restrictions Weight Bearing Restrictions: No General:   Vital Signs:     Therapy/Group: individual therapy  Tonny Branch 03/30/2021, 6:54 AM

## 2021-03-30 NOTE — Progress Notes (Signed)
Occupational Therapy Session Note  Patient Details  Name: Shannon [name] MRN: [id number] Date of Birth: [birth year]  Today's Date: 03/30/2021 OT Individual Time: 7011-0034 OT Individual Time Calculation (min): 28 min    Short Term Goals: Week 1:  OT Short Term Goal 1 (Week 1): Pt will don LB clothing with AE with S. OT Short Term Goal 2 (Week 1): Pt will be able to toilet with S. OT Short Term Goal 3 (Week 1): Pt will be able to tolerate standing at the sink to brush her teeth.  Skilled Therapeutic Interventions/Progress Updates:    Pt sitting EOB upon arrival with RN and NT present. OT intervention with focus on functional amb without AD and standing balance to increase independence with BADLs. Amb in hallway with no AD at supervision level. Pt engaged in dynamic standing task on Airex pad while tossing ball against rebounder-supervision. Pt engaged in Biodex balance activities. Pt noted with difficulty shifting weight anteriorly. Pt reports she has always had difficulty shifting weight onto her toes. Pt completed several activities with supervision, frequently overshooting target. Pt returned to room and bed. Pt remained in bed with all needs within reach.   Therapy Documentation Precautions:  Precautions Precautions: Back, Fall Restrictions Weight Bearing Restrictions: No Pain:  Pt denies pain this afternoon   Therapy/Group: Individual Therapy  Leroy Libman 03/30/2021, 2:35 PM

## 2021-03-30 NOTE — Progress Notes (Signed)
Occupational Therapy Session Note  Patient Details  Name: Shannon [name] MRN: [id number] Date of Birth: [birth year]  Today's Date: 03/30/2021 OT Individual Time: 0930-1030 OT Individual Time Calculation (min): 60 min    Short Term Goals: Week 1:  OT Short Term Goal 1 (Week 1): Pt will don LB clothing with AE with S. OT Short Term Goal 2 (Week 1): Pt will be able to toilet with S. OT Short Term Goal 3 (Week 1): Pt will be able to tolerate standing at the sink to brush her teeth.  Skilled Therapeutic Interventions/Progress Updates:    Pt resting in w/c upon arrival. Pt reported pain in middle/lower back (see below). Kinesiotape applied with reported relief. Pt amb without AD to tub room and practiced stepping over into tub without use of grab bars-supervision. Recommended seat for tub/shower. Pt states she has already ordered one and it has been delivered. Pt amb into ADL apartment and completed simple home mgmt tasks with focus on safety and adherence to back precautions. Pt amb to day room and engaged in game of corn hole and retrieving from floor with reacher. Pt employs proper technique for retrieving bean bags from floor-bending at knees. Pt returned to room and remained seated in w/c with all needs within reach.   Therapy Documentation Precautions:  Precautions Precautions: Back, Fall Restrictions Weight Bearing Restrictions: No  Pain: Pt c/o lower back spasms/tightness; Kinesiotape applied on paraspinals T5-T12; pt reported some relief   Therapy/Group: Individual Therapy  Leroy Libman 03/30/2021, 10:31 AM

## 2021-03-30 NOTE — Progress Notes (Signed)
Physical Therapy Session Note  Patient Details  Name: Shannon [name] MRN: [id number] Date of Birth: 14-May-1981  Today's Date: 03/30/2021 PT Individual Time: 1120-1220 PT Individual Time Calculation (min): 60 min  Short Term Goals: Week 1:  PT Short Term Goal 1 (Week 1): Pt wil perform supine<>sit CGA PT Short Term Goal 2 (Week 1): Sit<>stand and stand pivot transfers using LRAD with CGA PT Short Term Goal 3 (Week 1): Pt will ambulate at least 59ft using LRAD with CGA PT Short Term Goal 4 (Week 1): Pt will ascend/descend 4 steps using B HRs with mod assist  Skilled Therapeutic Interventions/Progress Updates:     Patient in bed preparing for a nap upon PT arrival due to PT's late arrival. Patient alert and agreeable to PT session. Patient denied pain during session.  Focused session on unsupported sitting tolerance with patient sitting EOB, sat up from lying with supervision, and discussed emotional health and well being and the effects on physical health. Patient presents with a positive outlook and provided several effective coping strategies during discussion. Provided patient an opportunity to discuss the challenges she has gone through and that may still be up coming with her recovery and treatment. Discussed strategies to incorporate her children and family into her recovery and discussed ways to promote participation in family events and outings with energy conservation strategies. Lisa, RT, jointed half way through the session and elaborated on these topics and discussed goals for a therapeutic community outing for community integration and incite into her abilities and use of strategies discussed during session. Patient very appreciative of having the time to discuss these topics and looking forward to a co-treat with PT or OT and RT for an outing prior to d/c.   Patient sitting EOB in preparation for lunch at end of session with breaks locked and all needs within reach.   Therapy  Documentation Precautions:  Precautions Precautions: Back, Fall Restrictions Weight Bearing Restrictions: No    Therapy/Group: Co-Treatment  Tyrece Vanterpool L Pat Elicker PT, DPT  03/30/2021, 6:17 PM

## 2021-03-31 MED ORDER — PREDNISONE 10 MG PO TABS
5.0000 mg | ORAL_TABLET | Freq: Every day | ORAL | Status: AC
  Administered 2021-04-04 – 2021-04-05 (×2): 5 mg via ORAL
  Filled 2021-03-31 (×2): qty 1

## 2021-03-31 MED ORDER — PREDNISONE 10 MG PO TABS
10.0000 mg | ORAL_TABLET | Freq: Every day | ORAL | Status: AC
  Administered 2021-04-02 – 2021-04-03 (×2): 10 mg via ORAL
  Filled 2021-03-31 (×2): qty 1

## 2021-03-31 NOTE — Progress Notes (Signed)
Discussed elevated WBC with Dr. Mickeal Skinner. He felt that latter likely due to steroids in absence of infection  as WBC was normal at admission. He will check follow up labs on outpatient basis.

## 2021-03-31 NOTE — Progress Notes (Signed)
Patient ID: Shannon [name], female   DOB: 04/21/1981, 40 y.o.   MRN: [id number]  SW had FMLA forms completed by physician. SW provided pt with original forms in room as she was off the floor.   SW faxed outpatient PT referral to [facility] location (p:[phone]/[phone]).  Loralee Pacas, MSW, [place] Office: [phone] Cell: [phone] Fax: [phone]

## 2021-03-31 NOTE — Progress Notes (Signed)
Recreational Therapy Session Note  Patient Details  Name: Shannon [name] MRN: [id number] Date of Birth: [birth year] Today's Date: 03/31/2021  Pain: no c/o Skilled Therapeutic Interventions/Progress Updates: Session focused on outing planning including potential locations and goals.  Pt verbally problem solving through community pursuits with her children with Mod I.  Pt is optimist about the outing and how this will carry over into community pursuits once home.  Therapy/Group: Individual Therapy   Welden Hausmann 03/31/2021, 11:25 AM

## 2021-03-31 NOTE — Progress Notes (Signed)
Occupational Therapy Note  Patient Details  Name: Angelina Venard MRN: [id number] Date of Birth: [birth year]  Today's Date: 03/31/2021 OT Missed Time: 93 Minutes Missed Time Reason: Other (comment) (elevated HR and BP)  Pt missed 30 mins skilled OT services secondary to elevated BP  (160/112) and HR (128). RN present.   Leotis Shames [facility] 03/31/2021, 2:12 PM

## 2021-03-31 NOTE — Plan of Care (Signed)
  Problem: Consults Goal: RH SPINAL CORD INJURY PATIENT EDUCATION Description:  See Patient Education module for education specifics.  Outcome: Progressing Goal: Skin Care Protocol Initiated - if Braden Score 18 or less Description: If consults are not indicated, leave blank or document N/A Outcome: Progressing   Problem: RH SKIN INTEGRITY Goal: RH STG ABLE TO PERFORM INCISION/WOUND CARE W/ASSISTANCE Description: STG Able To Perform Incision/Wound Care With Supervision Assistance. Outcome: Progressing   Problem: RH SAFETY Goal: RH STG ADHERE TO SAFETY PRECAUTIONS W/ASSISTANCE/DEVICE Description: STG Adhere to Safety Precautions With Cues and Reminders. Outcome: Progressing   Problem: RH PAIN MANAGEMENT Goal: RH STG PAIN MANAGED AT OR BELOW PT'S PAIN GOAL Description: < 4 on a 0-10 pain scale. Outcome: Progressing   Problem: RH KNOWLEDGE DEFICIT SCI Goal: RH STG INCREASE KNOWLEDGE OF SELF CARE AFTER SCI Description: Patient will be able to demonstrate knowledge of medication management, pain management, skin/wound care with educational materials and handouts provided by staff independently at discharge. Outcome: Progressing

## 2021-03-31 NOTE — Progress Notes (Signed)
Occupational Therapy Weekly Progress Note  Patient Details  Name: Shannon [name] MRN: [id number] Date of Birth: 1981/05/30  Beginning of progress report period: March 25, 2021 End of progress report period: March 31, 2021   Patient has met 3 of 3 short term goals. Pt made excellent progress with BADLs and functional transfers since admission. Pt currently completes bathing tasks at shower level with supervision using AE PRN. Pt completes dressing tasks with supervision using AE appropriately for LB dressing tasks. All functional transfers with no AD at supervision level. Toileting with supervision.   Patient continues to demonstrate the following deficits: muscle weakness, decreased cardiorespiratoy endurance, unbalanced muscle activation, and decreased standing balance, decreased postural control, and decreased balance strategies and therefore will continue to benefit from skilled OT intervention to enhance overall performance with BADL and iADL.  Patient progressing toward long term goals..  Continue plan of care.  OT Short Term Goals Week 1:  OT Short Term Goal 1 (Week 1): Pt will don LB clothing with AE with S. OT Short Term Goal 1 - Progress (Week 1): Met OT Short Term Goal 2 (Week 1): Pt will be able to toilet with S. OT Short Term Goal 2 - Progress (Week 1): Met OT Short Term Goal 3 (Week 1): Pt will be able to tolerate standing at the sink to brush her teeth. OT Short Term Goal 3 - Progress (Week 1): Met Week 2:     Leroy Libman 03/31/2021, 6:52 AM

## 2021-03-31 NOTE — Progress Notes (Signed)
PROGRESS NOTE   Subjective/Complaints:   Pt reports feels great- doing well overall.  Been without the traditional pain meds for 2 days- taking muscle relaxants, etc, but not pain meds.   Family training today- says she doesn't have a way to leave on 6/14- needs 6/16.   ROS:   Pt denies SOB, abd pain, CP, N/V/C/D, and vision changes    Objective:   No results found. Recent Labs    03/30/21 0503  WBC 25.7*  HGB 11.6*  HCT 36.7  PLT 375   Recent Labs    03/30/21 0503  NA 138  K 4.0  CL 100  CO2 30  GLUCOSE 94  BUN 13  CREATININE 0.97  CALCIUM 9.2    Intake/Output Summary (Last 24 hours) at 03/31/2021 0824 Last data filed at 03/31/2021 0801 Gross per 24 hour  Intake 657 ml  Output --  Net 657 ml        Physical Exam: Vital Signs Blood pressure (!) 135/100, pulse 94, temperature 98.3 F (36.8 C), resp. rate 18, height 5\' 2"  (1.575 m), weight 120.7 kg, last menstrual period 03/03/2021, SpO2 98 %, unknown if currently breastfeeding.       General: awake, alert, appropriate, sitting up in bed; NAD HENT: conjugate gaze; oropharynx moist CV: regular rate; no JVD Pulmonary: CTA B/L; no W/R/R- good air movement GI: soft, NT, ND, (+)BS Psychiatric: appropriate; interactive Neurological: Ox3 Musculoskeletal:        General: No swelling or tenderness.     Cervical back: Normal range of motion and neck supple.     Comments: No edema or tenderness in extremities  Skin:    General: Skin is warm and dry.     Comments: Back incision intact with very few steristrips- looks great- no drainage or erythema - looks great Neurological:     Mental Status: She is alert.     Comments: Alert and oriented Motor: Bilateral upper extremities: 5/5 proximal distal Bilateral lower extremities: Flexion, knee extension 4 -/5, ankle dorsiflexion 5/5 Sensation intact light touch     No results found for this or any  previous visit (from the past 48 hour(s)). No results found.   Assessment/Plan: 1. Functional deficits which require 3+ hours per day of interdisciplinary therapy in a comprehensive inpatient rehab setting. Physiatrist is providing close team supervision and 24 hour management of active medical problems listed below. Physiatrist and rehab team continue to assess barriers to discharge/monitor patient progress toward functional and medical goals  Care Tool:  Bathing    Body parts bathed by patient: Right arm, Left arm, Chest, Abdomen, Front perineal area, Right upper leg, Left upper leg, Face, Buttocks, Right lower leg, Left lower leg         Bathing assist Assist Level: Set up assist     Upper Body Dressing/Undressing Upper body dressing   What is the patient wearing?: Pull over shirt    Upper body assist Assist Level: Set up assist    Lower Body Dressing/Undressing Lower body dressing      What is the patient wearing?: Pants, Underwear/pull up     Lower body assist Assist for lower body dressing:  Supervision/Verbal cueing     Toileting Toileting    Toileting assist Assist for toileting: Minimal Assistance - Patient > 75%     Transfers Chair/bed transfer  Transfers assist  Chair/bed transfer activity did not occur: Safety/medical concerns (required use of RW)  Chair/bed transfer assist level: Supervision/Verbal cueing     Locomotion Ambulation   Ambulation assist   Ambulation activity did not occur: Safety/medical concerns (requires use of RW)  Assist level: Supervision/Verbal cueing Assistive device: Walker-rolling Max distance: 300'   Walk 10 feet activity   Assist  Walk 10 feet activity did not occur: Safety/medical concerns  Assist level: Supervision/Verbal cueing Assistive device: Walker-rolling   Walk 50 feet activity   Assist Walk 50 feet with 2 turns activity did not occur: Safety/medical concerns  Assist level: Supervision/Verbal  cueing Assistive device: Walker-rolling    Walk 150 feet activity   Assist Walk 150 feet activity did not occur: Safety/medical concerns  Assist level: Supervision/Verbal cueing Assistive device: Walker-rolling    Walk 10 feet on uneven surface  activity   Assist Walk 10 feet on uneven surfaces activity did not occur: Safety/medical concerns   Assist level: Contact Guard/Touching assist Assistive device: Aeronautical engineer Will patient use wheelchair at discharge?: No             Wheelchair 50 feet with 2 turns activity    Assist            Wheelchair 150 feet activity     Assist          Blood pressure (!) 135/100, pulse 94, temperature 98.3 F (36.8 C), resp. rate 18, height 5\' 2"  (1.575 m), weight 120.7 kg, last menstrual period 03/03/2021, SpO2 98 %, unknown if currently breastfeeding.    Medical Problem List and Plan: 1.  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               pt may shower             -ELOS/Goals: 10-14 days/supervision/mod I           con't PT and OT- pt said cannot arrange to leave on 6/14- but can as we discussed on 6/16.  2.  Antithrombotics: -DVT/anticoagulation:  Pharmaceutical: Heparin 6/7- changed to lovenox- since 1x/day- d/w pt             -antiplatelet therapy: N/A 3. Pain Management: Continue Oxycodone prn             --On Gabapentin tid.              --will add baclofen tid prn. Continue flexeril at bedtime.   6/6- will add Flexeril 5 mg TID prn and change flexeril to 10 mg QHS- and monitor  6/7- pt actually liked Baclofen better than Flexeril- switched those meds and stopped Flexeril during day and gave Baclofen 5 mg TID prn.  6/10- pain controlled- no oxycodone in 2 days- con't muscle relaxants and oxy IF NEEDED.              Monitor with increased exertion 4. Mood: LCSW to follow for evaluation and support              -antipsychotic agents: N/AA 5. Neuropsych: This patient is capable of making decisions on her own behalf. 6. Skin/Wound Care: Monitor incision for healing.             -- Routine  pressure-relief measures. 7. Fluids/Electrolytes/Nutrition: Monitor intake/output.             CMP stable, monitor weekly.  8.  Elevated blood pressure:              --Better controlled, continue to Monitor 3 times daily.  Hydralazine prn for 24 hours.   6/9- BP a little elevated- but usually controlled- con't regimen  6/0- BP 130s/100 this AM- doesn't seem to be stable- con't to monitor and will change meds if see a trend.              Monitor with increased exertion 9. Neurogenic bladder: Incontinence has resolved but now with urgency/feeling of not emptying fully             PVRs ordered  6/6- PVRs are OK- but has also c/o bladder spasms-and WBC is elevated even more- will check U/A and Cx   6/9- tx'd with fosfomycin.  10. Anxiety/ADD/ADHD/Depression: has been under lot of stress.              --Continue Vyvanse and Cymbalta  6/8- change time to give together             Team support 11. Leukocytosis: likely 2/2 steroids, afebrile, repeat Monday.  6/6- up to 26k- UTI?- checking today! Will recheck labs in AM  6/7- Pt has UTI- also up due to steroids, but is stable /slightly better- treating with Fosfomycin-   6/8- Cx is multiple species, but pt feeling much better- will check CBC tomorrow, to make sure it's coming down and follow clinically  6/9- WBC still 25k- however no left shift- Neutrophils are 55- not elevated.  12. Ependymoma: pathology results discussed with patient. Patient would like to learn more about this tumor and discuss treatment plan with oncology as soon as possible.  6/7- spoke with Oncology and they will get back to me about plan.   6/9- they plan on seeing her after d/c.  13. UTI  6/7- Culture pending- since allergic to Cephalosporins -hives, and Bactrim- hives, called pharmacy and  discussed- agreed on Fosfomycin- 1x and will wait for urine Cx.   6/8- s/p fosfomycin- Cx shows multiple species- as above.   6/10- will check next labs on Monday 14. Constipation  6/8- had a good BM today- abd tightness resolved- will con't bowel regimen and monitor  6/9- doing better- con't regimen   LOS: 7 days A FACE TO FACE EVALUATION WAS PERFORMED  Shannon [name] 03/31/2021, 8:24 AM

## 2021-03-31 NOTE — Progress Notes (Signed)
Inpatient Rehabilitation Care Coordinator Discharge Note  The overall goal for the admission was met for:   Discharge location: Yes. D/c to home with support from husband, and various family and friends.   Length of Stay: Yes.-13 days  Discharge activity level: Yes.  Home/community participation: Yes. Limited.   Services provided included: MD, RD, PT, OT, RN, CM, TR, Pharmacy, Neuropsych, and SW  Financial Services: Private Insurance: [employer] offered to/list presented to:YEs  Follow-up services arranged: Outpatient: [facility]  and DME: No equipment needs  Comments (or additional information):  Patient/Family verbalized understanding of follow-up arrangements: Yes  Individual responsible for coordination of the follow-up plan: contact pt [phone]  Confirmed correct DME delivered: Rana Snare 03/31/2021    Rana Snare

## 2021-03-31 NOTE — Progress Notes (Signed)
Occupational Therapy Session Note  Patient Details  Name: Sotiria Keast MRN: [id number] Date of Birth: [birth year]  Today's Date: 03/31/2021 OT Individual Time: 1100-1145 OT Individual Time Calculation (min): 45 min  and Today's Date: 03/31/2021 OT Missed Time: 15 Minutes Missed Time Reason: Unavailable (comment)   Short Term Goals: Week 2:  OT Short Term Goal 1 (Week 2): STG=LTG 2/2 ELOS  Skilled Therapeutic Interventions/Progress Updates:    Pt seated EOB with family present for education. Pt amb in hallway without AD-supervision. Pt practiced tub tranfsers and walk in shower tranfsers. Pt able to step over edge of tub using walls to steady. Pt completed all tranfsers with supervision. Recommendation that family/care giver be present when pt taking shower. All present verbalized understanding. Demonstrated use of Kinesiotape. Pt has already ordered Kinesiotape for use at home. All questions answered. Pt/family pleased with progress. Pt remained seated EOB with all needs within reach.   Therapy Documentation Precautions:  Precautions Precautions: Back, Fall Restrictions Weight Bearing Restrictions: No General: General OT Amount of Missed Time: 15 Minutes   Pain: Pt denies pain this morning   Therapy/Group: Individual Therapy  Leroy Libman 03/31/2021, 11:47 AM

## 2021-03-31 NOTE — Progress Notes (Signed)
Physical Therapy Session Note  Patient Details  Name: Shannon [name] MRN: [id number] Date of Birth: May 24, 1981  Today's Date: 03/31/2021 PT Individual Time: 1000-1100 PT Individual Time Calculation (min): 60 min   Short Term Goals: Week 1:  PT Short Term Goal 1 (Week 1): Pt wil perform supine<>sit CGA PT Short Term Goal 2 (Week 1): Sit<>stand and stand pivot transfers using LRAD with CGA PT Short Term Goal 3 (Week 1): Pt will ambulate at least 82ft using LRAD with CGA PT Short Term Goal 4 (Week 1): Pt will ascend/descend 4 steps using B HRs with mod assist Week 2:    Week 3:     Skilled Therapeutic Interventions/Progress Updates:    Pain:  Pt reports no pain.  Treatment to tolerance.  Rest breaks and repositioning as needed.  Pt initially seated on edge of bed W/RT at bedside and agreeable to treatment session w/focus on functional mobility, introduction to gentle back stetching exercises, family training.  Family arrived for training including father and step mother.  Observed full session.    Sit to stand w/supervison.  Gait >255ft , 120ft on level surface w/supervision, overall decreased cadence, head turns every 5 steps w/no balance loss.  Pt instructed w/and performed mat therex including: Seated pelvic tilts Supine clamshells Supine single knee to chest Supine piroformis stretch Supine pelvic tilts Pt independent w/all mat mobility and adheres to spinal precautions without cueing.  Gait x >341ft including use of elevator w/supervison x 2 en route to/from outdoors.w/seated rest between each effort.  Gait on outdoor surfaces including inclines/declines/uneven sidewalks, stairs w/single rail x 4, curb >47ft w/supervision.  Pt instructed w/core activation to stabilize spine w/descending stairs and inclines.  Pt left seated on edge of bed w/bed alarm set and needs in reach, family at bedside.  Therapy Documentation Precautions:  Precautions Precautions: Back,  Fall Restrictions Weight Bearing Restrictions: No     Therapy/Group: Individual Therapy Callie Fielding, [place] 03/31/2021, 12:20 PM

## 2021-03-31 NOTE — Progress Notes (Signed)
Occupational Therapy Session Note  Patient Details  Name: Shannon [name] MRN: [id number] Date of Birth: 1981/01/22  Today's Date: 03/31/2021 OT Individual Time: 1287-8676 OT Individual Time Calculation (min): 40 min   Today's Date: 03/31/2021 OT Group Time: 1500-1600 OT Group Time Calculation (min): 60 min    Skilled Therapeutic Interventions/Progress Updates:    Session 1: 4/10 pain in back with recommendation to use pillows inbetween Les for support to keep from twisting during sleep as pt attributes pain to that. Shower heat and rest breaks provided per pt needs PRN. Pt tasked with gathering all items and setting up shower needs and dressing needs prior to shower. Pt completes gathering items and dressing with set up at ambulatory level with no AD. Pt able to retrieve dropped items off floor wit reacher using squat tenique to get closer to ground with S. Exited session with pt seated in EOB, exit alarm on and call light in reach   Session 2: Pt participated in rhythmic drumming group. Pain 4/10 in back with self initiated rest breaks PRN. Focus of group on BUE coordination, strengthening, endurance, timing/control, activity tolerance, and social participation and engagement. Pt performs session from seated position for energy conservation. Skilled interventions included adapting drumming moves to abide by BLT precautions and grading movements up through larger ROM. Warm up performed prior to exercises and UB stretching completed at end of group with demo from OT. Pt able to select preferred song to share with group. Returned pt to room at end of session. Exited session with pt seated in bed, exit alarm on and call light in reach  Therapy Documentation Precautions:  Precautions Precautions: Back, Fall Restrictions Weight Bearing Restrictions: No General:   Vital Signs: Therapy Vitals Temp: 98.3 F (36.8 C) Pulse Rate: 94 Resp: 18 BP: (!) 135/100 Patient Position (if appropriate):  Lying Oxygen Therapy SpO2: 98 % O2 Device: Room Air Pain:   ADL: ADL Eating: Independent Grooming: Setup Upper Body Bathing: Supervision/safety Where Assessed-Upper Body Bathing: Shower Lower Body Bathing: Moderate assistance Where Assessed-Lower Body Bathing: Shower Upper Body Dressing: Supervision/safety Lower Body Dressing: Moderate assistance Toileting: Minimal assistance Where Assessed-Toileting: Glass blower/designer: Psychiatric nurse Method: Counselling psychologist: Raised Counselling psychologist: Moderate assistance Social research officer, government Method: Heritage manager: Radio broadcast assistant, Systems analyst    Praxis   Exercises:   Other Treatments:     Therapy/Group: Group Therapy  Tonny Branch 03/31/2021, 6:58 AM

## 2021-04-01 MED ORDER — AMLODIPINE BESYLATE 2.5 MG PO TABS
2.5000 mg | ORAL_TABLET | Freq: Every day | ORAL | Status: DC
  Filled 2021-04-01: qty 1

## 2021-04-01 NOTE — Progress Notes (Signed)
PROGRESS NOTE   Subjective/Complaints: Pt reports n issues- no pain meds in 3 days- Bowels working well.  ROS:   Pt denies SOB, abd pain, CP, N/V/C/D, and vision changes   Objective:   No results found. Recent Labs    03/30/21 0503  WBC 25.7*  HGB 11.6*  HCT 36.7  PLT 375   Recent Labs    03/30/21 0503  NA 138  K 4.0  CL 100  CO2 30  GLUCOSE 94  BUN 13  CREATININE 0.97  CALCIUM 9.2    Intake/Output Summary (Last 24 hours) at 04/01/2021 1423 Last data filed at 04/01/2021 1300 Gross per 24 hour  Intake 610 ml  Output --  Net 610 ml        Physical Exam: Vital Signs Blood pressure (!) 145/85, pulse (!) 102, temperature 99.2 F (37.3 C), temperature source Oral, resp. rate 20, height 5\' 2"  (1.575 m), weight 120.7 kg, last menstrual period 03/03/2021, SpO2 97 %, unknown if currently breastfeeding.        General: awake, alert, appropriate, NAD HENT: conjugate gaze; oropharynx moist CV: regular rate; no JVD Pulmonary: CTA B/L; no W/R/R- good air movement GI: soft, NT, ND, (+)BS Psychiatric: appropriate Neurological: Ox3 Musculoskeletal:        General: No swelling or tenderness.     Cervical back: Normal range of motion and neck supple.     Comments: No edema or tenderness in extremities  Skin:    General: Skin is warm and dry.     Comments: Back incision intact with very few steristrips- looks great- no drainage or erythema - looks great Neurological:     Mental Status: She is alert.     Comments: Alert and oriented Motor: Bilateral upper extremities: 5/5 proximal distal Bilateral lower extremities: Flexion, knee extension 4 -/5, ankle dorsiflexion 5/5 Sensation intact light touch     No results found for this or any previous visit (from the past 48 hour(s)). No results found.   Assessment/Plan: 1. Functional deficits which require 3+ hours per day of interdisciplinary therapy in a  comprehensive inpatient rehab setting. Physiatrist is providing close team supervision and 24 hour management of active medical problems listed below. Physiatrist and rehab team continue to assess barriers to discharge/monitor patient progress toward functional and medical goals  Care Tool:  Bathing    Body parts bathed by patient: Right arm, Left arm, Chest, Abdomen, Front perineal area, Right upper leg, Left upper leg, Face, Buttocks, Right lower leg, Left lower leg         Bathing assist Assist Level: Set up assist     Upper Body Dressing/Undressing Upper body dressing   What is the patient wearing?: Pull over shirt    Upper body assist Assist Level: Set up assist    Lower Body Dressing/Undressing Lower body dressing      What is the patient wearing?: Pants, Underwear/pull up     Lower body assist Assist for lower body dressing: Supervision/Verbal cueing     Toileting Toileting    Toileting assist Assist for toileting: Minimal Assistance - Patient > 75%     Transfers Chair/bed transfer  Transfers assist  Chair/bed transfer activity did not occur: Safety/medical concerns (required use of RW)  Chair/bed transfer assist level: Independent     Locomotion Ambulation   Ambulation assist   Ambulation activity did not occur: Safety/medical concerns (requires use of RW)  Assist level: Supervision/Verbal cueing Assistive device: No Device Max distance: 1000   Walk 10 feet activity   Assist  Walk 10 feet activity did not occur: Safety/medical concerns  Assist level: Supervision/Verbal cueing Assistive device: No Device   Walk 50 feet activity   Assist Walk 50 feet with 2 turns activity did not occur: Safety/medical concerns  Assist level: Supervision/Verbal cueing Assistive device: No Device    Walk 150 feet activity   Assist Walk 150 feet activity did not occur: Safety/medical concerns  Assist level: Supervision/Verbal cueing Assistive  device: No Device    Walk 10 feet on uneven surface  activity   Assist Walk 10 feet on uneven surfaces activity did not occur: Safety/medical concerns   Assist level: Supervision/Verbal cueing Assistive device:  (no device)   Wheelchair     Assist Will patient use wheelchair at discharge?: No             Wheelchair 50 feet with 2 turns activity    Assist            Wheelchair 150 feet activity     Assist          Blood pressure (!) 145/85, pulse (!) 102, temperature 99.2 F (37.3 C), temperature source Oral, resp. rate 20, height 5\' 2"  (1.575 m), weight 120.7 kg, last menstrual period 03/03/2021, SpO2 97 %, unknown if currently breastfeeding.    Medical Problem List and Plan: 1.  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               pt may shower             -ELOS/Goals: 10-14 days/supervision/mod I           con't PT and OT- pt said cannot arrange to leave on 6/14- but can as we discussed on 6/16.   -con't PT and OT_ d/c 6/16 2.  Antithrombotics: -DVT/anticoagulation:  Pharmaceutical: Heparin 6/7- changed to lovenox- since 1x/day- d/w pt             -antiplatelet therapy: N/A 3. Pain Management: Continue Oxycodone prn             --On Gabapentin tid.              --will add baclofen tid prn. Continue flexeril at bedtime.   6/6- will add Flexeril 5 mg TID prn and change flexeril to 10 mg QHS- and monitor  6/7- pt actually liked Baclofen better than Flexeril- switched those meds and stopped Flexeril during day and gave Baclofen 5 mg TID prn.  6/11- pain controlled off Oxycodone- con't regimen              Monitor with increased exertion 4. Mood: LCSW to follow for evaluation and support             -antipsychotic agents: N/AA 5. Neuropsych: This patient is capable of making decisions on her own behalf. 6. Skin/Wound Care: Monitor incision for healing.             -- Routine pressure-relief  measures. 7. Fluids/Electrolytes/Nutrition: Monitor intake/output.             CMP stable, monitor weekly.  8.  Elevated  blood pressure:              --Better controlled, continue to Monitor 3 times daily.  Hydralazine prn for 24 hours.   6/9- BP a little elevated- but usually controlled- con't regimen  6/0- BP 130s/100 this AM- doesn't seem to be stable- con't to monitor and will change meds if see a trend.   6/11- BP 140s/100s- has hydralazine prn- will start Norvasc 2.5 mg daily for BP.              Monitor with increased exertion 9. Neurogenic bladder: Incontinence has resolved but now with urgency/feeling of not emptying fully             PVRs ordered  6/6- PVRs are OK- but has also c/o bladder spasms-and WBC is elevated even more- will check U/A and Cx   6/9- tx'd with fosfomycin.  10. Anxiety/ADD/ADHD/Depression: has been under lot of stress.              --Continue Vyvanse and Cymbalta  6/8- change time to give together             Team support 11. Leukocytosis: likely 2/2 steroids, afebrile, repeat Monday.  6/6- up to 26k- UTI?- checking today! Will recheck labs in AM  6/7- Pt has UTI- also up due to steroids, but is stable /slightly better- treating with Fosfomycin-   6/8- Cx is multiple species, but pt feeling much better- will check CBC tomorrow, to make sure it's coming down and follow clinically  6/9- WBC still 25k- however no left shift- Neutrophils are 55- not elevated.  12. Ependymoma: pathology results discussed with patient. Patient would like to learn more about this tumor and discuss treatment plan with oncology as soon as possible.  6/7- spoke with Oncology and they will get back to me about plan.   6/9- they plan on seeing her after d/c.   6/11- PA and I concerned about WBC and basophilia- since was nml when admitted, Onc said will just see outpt.  13. UTI  6/7- Culture pending- since allergic to Cephalosporins -hives, and Bactrim- hives, called pharmacy and  discussed- agreed on Fosfomycin- 1x and will wait for urine Cx.   6/8- s/p fosfomycin- Cx shows multiple species- as above.   6/10- will check next labs on Monday 14. Constipation  6/8- had a good BM today- abd tightness resolved- will con't bowel regimen and monitor  6/9- doing better- con't regimen  6/11- resolved- off oxycodone- con't regimen  LOS: 8 days A FACE TO FACE EVALUATION WAS PERFORMED  Geriann Lafont 04/01/2021, 2:23 PM

## 2021-04-01 NOTE — Progress Notes (Signed)
Physical Therapy Session Note  Patient Details  Name: Shannon [name] MRN: [id number] Date of Birth: 1980/11/11  Today's Date: 04/01/2021 PT Individual Time: 5953-9672 PT Individual Time Calculation (min): 58 min   Short Term Goals: Week 1:  PT Short Term Goal 1 (Week 1): Pt wil perform supine<>sit CGA PT Short Term Goal 2 (Week 1): Sit<>stand and stand pivot transfers using LRAD with CGA PT Short Term Goal 3 (Week 1): Pt will ambulate at least 32ft using LRAD with CGA PT Short Term Goal 4 (Week 1): Pt will ascend/descend 4 steps using B HRs with mod assist  Skilled Therapeutic Interventions/Progress Updates:     Chart reviewed. Pt received in bed and agreeable to therapy. Session focused on increasing independence and endurance for transfer and locomotion goals in home and community environments.  Pt participated in bed mobility displaying independence and adhereance to back precautions. Pt then ambulated multiple rounds of 108ft+ through indoor and outdoor environments with supervision. Pt managed elevators and doorways with independence. Of note, pt did require 3 rest breaks of 2-4 mins approximately every 1082ft. Outdoor surfaces included grass, hills and curbs. Indoors, pt also navigated 5 steps with unilateral rail with modified independence.  Once returned to room, pt completed BERG balance with score of 54/56. Pt was left seated in recliner with call bell and all needs in reach.  Therapy Documentation Precautions:  Precautions Precautions: Back, Fall Restrictions Weight Bearing Restrictions: No  Pain: 0/10 - No Pain   Therapy/Group: Individual Therapy  Marquette Saa, PT, DPT 04/01/2021, 12:23 PM

## 2021-04-01 NOTE — Progress Notes (Signed)
Occupational Therapy Session Note  Patient Details  Name: Shannon [name] MRN: [id number] Date of Birth: 1980/12/06  Today's Date: 04/01/2021 OT Individual Time: 1300-1400 OT Individual Time Calculation (min): 60 min    Short Term Goals: Week 1:  OT Short Term Goal 1 (Week 1): Pt will don LB clothing with AE with S. OT Short Term Goal 1 - Progress (Week 1): Met OT Short Term Goal 2 (Week 1): Pt will be able to toilet with S. OT Short Term Goal 2 - Progress (Week 1): Met OT Short Term Goal 3 (Week 1): Pt will be able to tolerate standing at the sink to brush her teeth. OT Short Term Goal 3 - Progress (Week 1): Met  Skilled Therapeutic Interventions/Progress Updates:     Pt received in bed with visitors present. Pt agreeable to OT with 6/10 pain that RN delivers medication. Pt provided with opportunity to shower to relax tight muscles.   ADL:  Pt completes gathering of items with set up using reacher appropriately and demo good adherence ot precautions. Pt completes shower level BADL with Set up overall. Pt provided energy conservation handout with ADL and IADL adaptations to improve particpation in meaningful household activities. Educated on use of lighter items in kitchen as well as sliding items across counter to decrease lifting. Pt verbalized understanding. Pt removes w/c cushion covers from dryer using squat method to eliminate forward bending and spins washing machine with VC to get rest of clothing. Pt educated on use of reacher to unload washing machine.   Pt left at end of session in bed with exit alarm on, call light in reach and all needs met   Therapy Documentation Precautions:  Precautions Precautions: Back, Fall Restrictions Weight Bearing Restrictions: No General:   Vital Signs: Therapy Vitals Temp: 98.6 F (37 C) Temp Source: Oral Pulse Rate: (!) 102 Resp: 20 BP: (!) 145/94 Patient Position (if appropriate): Sitting Oxygen Therapy SpO2: 100 % O2 Device:  Room Air Pain: Pain Assessment Pain Scale: 0-10 Pain Score: 0-No pain ADL: ADL Eating: Independent Grooming: Setup Upper Body Bathing: Supervision/safety Where Assessed-Upper Body Bathing: Shower Lower Body Bathing: Moderate assistance Where Assessed-Lower Body Bathing: Shower Upper Body Dressing: Supervision/safety Lower Body Dressing: Moderate assistance Toileting: Minimal assistance Where Assessed-Toileting: Glass blower/designer: Psychiatric nurse Method: Counselling psychologist: Raised Counselling psychologist: Moderate assistance Social research officer, government Method: Heritage manager: Radio broadcast assistant, Systems analyst    Praxis   Exercises:   Other Treatments:     Therapy/Group: Individual Therapy  Tonny Branch 04/01/2021, 6:49 AM

## 2021-04-01 NOTE — Plan of Care (Signed)
  Problem: Consults Goal: RH SPINAL CORD INJURY PATIENT EDUCATION Description:  See Patient Education module for education specifics.  Outcome: Progressing Goal: Skin Care Protocol Initiated - if Braden Score 18 or less Description: If consults are not indicated, leave blank or document N/A Outcome: Progressing   Problem: RH SKIN INTEGRITY Goal: RH STG ABLE TO PERFORM INCISION/WOUND CARE W/ASSISTANCE Description: STG Able To Perform Incision/Wound Care With Supervision Assistance. Outcome: Progressing   Problem: RH SAFETY Goal: RH STG ADHERE TO SAFETY PRECAUTIONS W/ASSISTANCE/DEVICE Description: STG Adhere to Safety Precautions With Cues and Reminders. Outcome: Progressing   Problem: RH PAIN MANAGEMENT Goal: RH STG PAIN MANAGED AT OR BELOW PT'S PAIN GOAL Description: < 4 on a 0-10 pain scale. Outcome: Progressing   Problem: RH KNOWLEDGE DEFICIT SCI Goal: RH STG INCREASE KNOWLEDGE OF SELF CARE AFTER SCI Description: Patient will be able to demonstrate knowledge of medication management, pain management, skin/wound care with educational materials and handouts provided by staff independently at discharge. Outcome: Progressing

## 2021-04-02 MED ORDER — CARVEDILOL 3.125 MG PO TABS
3.1250 mg | ORAL_TABLET | Freq: Two times a day (BID) | ORAL | Status: DC
  Administered 2021-04-02 – 2021-04-03 (×2): 3.125 mg via ORAL
  Filled 2021-04-02 (×3): qty 1

## 2021-04-02 NOTE — Progress Notes (Signed)
Physical Therapy Note  Patient Details  Name: Shannon [name] MRN: [id number] Date of Birth: [birth year] Today's Date: 04/02/2021   Attempted to see patient for scheduled therapy session this PM. Pt reports feeling fatigued due to just starting her cycle, declines participation this PM. Pt left supine in bed with needs in reach. Pt missed 45 min of scheduled therapy session.   Excell Seltzer, PT, DPT, CSRS  04/02/2021, 5:07 PM

## 2021-04-02 NOTE — Progress Notes (Signed)
PROGRESS NOTE   Subjective/Complaints:   Pt asking about heart rate being somewhat elevated- thinks could be white coat syndrome to some extent, and also has somewhat elevated BP- will change Norvasc started yesterday to Coreg 3.125 mg BID to help with both- pt doesn't want to stop Vyvanse- explained will need to see physician about it after d/c.   ROS:    Pt denies SOB, abd pain, CP, N/V/C/D, and vision changes  Objective:   No results found. No results for input(s): WBC, HGB, HCT, PLT in the last 72 hours.  No results for input(s): NA, K, CL, CO2, GLUCOSE, BUN, CREATININE, CALCIUM in the last 72 hours.   Intake/Output Summary (Last 24 hours) at 04/02/2021 1025 Last data filed at 04/02/2021 0700 Gross per 24 hour  Intake 1080 ml  Output --  Net 1080 ml        Physical Exam: Vital Signs Blood pressure 116/80, pulse 95, temperature 98.7 F (37.1 C), temperature source Oral, resp. rate 16, height 5\' 2"  (1.575 m), weight 120.7 kg, last menstrual period 03/03/2021, SpO2 98 %, unknown if currently breastfeeding.         General: awake, alert, appropriate, laying in bed; NAD HENT: conjugate gaze; oropharynx moist CV: regular rate but borderline tachycardia.; no JVD Pulmonary: CTA B/L; no W/R/R- good air movement GI: soft, NT, ND, (+)BS Psychiatric: appropriate- interactive Neurological: Ox3 Musculoskeletal:        General: No swelling or tenderness.     Cervical back: Normal range of motion and neck supple.     Comments: No edema or tenderness in extremities  Skin:    General: Skin is warm and dry.     Comments: Back incision intact with very few steristrips- looks great- no drainage or erythema - looks great Neurological:     Mental Status: She is alert.     Comments: Alert and oriented Motor: Bilateral upper extremities: 5/5 proximal distal Bilateral lower extremities: Flexion, knee extension 4 -/5, ankle  dorsiflexion 5/5 Sensation intact light touch     No results found for this or any previous visit (from the past 48 hour(s)). No results found.   Assessment/Plan: 1. Functional deficits which require 3+ hours per day of interdisciplinary therapy in a comprehensive inpatient rehab setting. Physiatrist is providing close team supervision and 24 hour management of active medical problems listed below. Physiatrist and rehab team continue to assess barriers to discharge/monitor patient progress toward functional and medical goals  Care Tool:  Bathing    Body parts bathed by patient: Right arm, Left arm, Chest, Abdomen, Front perineal area, Right upper leg, Left upper leg, Face, Buttocks, Right lower leg, Left lower leg         Bathing assist Assist Level: Set up assist     Upper Body Dressing/Undressing Upper body dressing   What is the patient wearing?: Pull over shirt    Upper body assist Assist Level: Set up assist    Lower Body Dressing/Undressing Lower body dressing      What is the patient wearing?: Pants, Underwear/pull up     Lower body assist Assist for lower body dressing: Supervision/Verbal cueing  Toileting Toileting    Toileting assist Assist for toileting: Minimal Assistance - Patient > 75%     Transfers Chair/bed transfer  Transfers assist  Chair/bed transfer activity did not occur: Safety/medical concerns (required use of RW)  Chair/bed transfer assist level: Independent     Locomotion Ambulation   Ambulation assist   Ambulation activity did not occur: Safety/medical concerns (requires use of RW)  Assist level: Supervision/Verbal cueing Assistive device: No Device Max distance: 1000   Walk 10 feet activity   Assist  Walk 10 feet activity did not occur: Safety/medical concerns  Assist level: Supervision/Verbal cueing Assistive device: No Device   Walk 50 feet activity   Assist Walk 50 feet with 2 turns activity did not  occur: Safety/medical concerns  Assist level: Supervision/Verbal cueing Assistive device: No Device    Walk 150 feet activity   Assist Walk 150 feet activity did not occur: Safety/medical concerns  Assist level: Supervision/Verbal cueing Assistive device: No Device    Walk 10 feet on uneven surface  activity   Assist Walk 10 feet on uneven surfaces activity did not occur: Safety/medical concerns   Assist level: Supervision/Verbal cueing Assistive device:  (no device)   Wheelchair     Assist Will patient use wheelchair at discharge?: No             Wheelchair 50 feet with 2 turns activity    Assist            Wheelchair 150 feet activity     Assist          Blood pressure 116/80, pulse 95, temperature 98.7 F (37.1 C), temperature source Oral, resp. rate 16, height 5\' 2"  (1.575 m), weight 120.7 kg, last menstrual period 03/03/2021, SpO2 98 %, unknown if currently breastfeeding.    Medical Problem List and Plan: 1.  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               pt may shower             -ELOS/Goals: 10-14 days/supervision/mod I           con't PT and OT- pt said cannot arrange to leave on 6/14- but can as we discussed on 6/16.   -con't PT and OT- d/c 6/16.  2.  Antithrombotics: -DVT/anticoagulation:  Pharmaceutical: Heparin 6/7- changed to lovenox- since 1x/day- d/w pt             -antiplatelet therapy: N/A 3. Pain Management: Continue Oxycodone prn             --On Gabapentin tid.              --will add baclofen tid prn. Continue flexeril at bedtime.   6/6- will add Flexeril 5 mg TID prn and change flexeril to 10 mg QHS- and monitor  6/7- pt actually liked Baclofen better than Flexeril- switched those meds and stopped Flexeril during day and gave Baclofen 5 mg TID prn.  6/12- pain controlled off oxy- con't regimen             Monitor with increased exertion 4. Mood: LCSW  to follow for evaluation and support             -antipsychotic agents: N/AA 5. Neuropsych: This patient is capable of making decisions on her own behalf. 6. Skin/Wound Care: Monitor incision for healing.             -- Routine pressure-relief  measures. 7. Fluids/Electrolytes/Nutrition: Monitor intake/output.             CMP stable, monitor weekly.  8.  Elevated blood pressure:              --Better controlled, continue to Monitor 3 times daily.  Hydralazine prn for 24 hours.   6/9- BP a little elevated- but usually controlled- con't regimen  6/0- BP 130s/100 this AM- doesn't seem to be stable- con't to monitor and will change meds if see a trend.   6/11- BP 140s/100s- has hydralazine prn- will start Norvasc 2.5 mg daily for BP.              Monitor with increased exertion 9. Neurogenic bladder: Incontinence has resolved but now with urgency/feeling of not emptying fully             PVRs ordered  6/6- PVRs are OK- but has also c/o bladder spasms-and WBC is elevated even more- will check U/A and Cx   6/9- tx'd with fosfomycin.  10. Anxiety/ADD/ADHD/Depression: has been under lot of stress.              --Continue Vyvanse and Cymbalta  6/8- change time to give together             Team support 11. Leukocytosis: likely 2/2 steroids, afebrile, repeat Monday.  6/6- up to 26k- UTI?- checking today! Will recheck labs in AM  6/7- Pt has UTI- also up due to steroids, but is stable /slightly better- treating with Fosfomycin-   6/8- Cx is multiple species, but pt feeling much better- will check CBC tomorrow, to make sure it's coming down and follow clinically  6/9- WBC still 25k- however no left shift- Neutrophils are 55- not elevated.  12. Ependymoma: pathology results discussed with patient. Patient would like to learn more about this tumor and discuss treatment plan with oncology as soon as possible.  6/7- spoke with Oncology and they will get back to me about plan.   6/9- they plan on seeing her  after d/c.   6/11- PA and I concerned about WBC and basophilia- since was nml when admitted, Onc said will just see outpt.  13. UTI  6/7- Culture pending- since allergic to Cephalosporins -hives, and Bactrim- hives, called pharmacy and discussed- agreed on Fosfomycin- 1x and will wait for urine Cx.   6/8- s/p fosfomycin- Cx shows multiple species- as above.   6/10- will check next labs on Monday 14. Constipation  6/8- had a good BM today- abd tightness resolved- will con't bowel regimen and monitor  6/9- doing better- con't regimen  6/11- resolved- off oxycodone- con't regimen 15. Tachycardia/Elevated BP  6/12- will d/c Norvasc that was started yesterday and add Coreg 3.125 mg BID for both issues and monitor- might need increase dose.     LOS: 9 days A FACE TO FACE EVALUATION WAS PERFORMED  Shellene Sweigert 04/02/2021, 10:25 AM

## 2021-04-02 NOTE — Plan of Care (Signed)
  Problem: RH Balance Goal: LTG Patient will maintain dynamic sitting balance (PT) Description: LTG:  Patient will maintain dynamic sitting balance with assistance during mobility activities (PT) Flowsheets (Taken 04/02/2021 1209) LTG: Pt will maintain dynamic sitting balance during mobility activities with:: (upgrade due to progress) Independent Note: upgrade due to progress Goal: LTG Patient will maintain dynamic standing balance (PT) Description: LTG:  Patient will maintain dynamic standing balance with assistance during mobility activities (PT) Flowsheets (Taken 04/02/2021 1209) LTG: Pt will maintain dynamic standing balance during mobility activities with:: (upgrade due to progress) Independent Note: upgrade due to progress   Problem: Sit to Stand Goal: LTG:  Patient will perform sit to stand with assistance level (PT) Description: LTG:  Patient will perform sit to stand with assistance level (PT) Flowsheets (Taken 04/02/2021 1209) LTG: PT will perform sit to stand in preparation for functional mobility with assistance level: (upgrade due to progress) Independent Note: upgrade due to progress   Problem: RH Bed Mobility Goal: LTG Patient will perform bed mobility with assist (PT) Description: LTG: Patient will perform bed mobility with assistance, with/without cues (PT). Flowsheets (Taken 04/02/2021 1209) LTG: Pt will perform bed mobility with assistance level of: (upgrade due to progress) Independent Note: upgrade due to progress   Problem: RH Bed to Chair Transfers Goal: LTG Patient will perform bed/chair transfers w/assist (PT) Description: LTG: Patient will perform bed to chair transfers with assistance (PT). Flowsheets (Taken 04/02/2021 1209) LTG: Pt will perform Bed to Chair Transfers with assistance level: (upgrade due to progress) Independent Note: upgrade due to progress   Problem: RH Car Transfers Goal: LTG Patient will perform car transfers with assist (PT) Description:  LTG: Patient will perform car transfers with assistance (PT). Flowsheets (Taken 04/02/2021 1209) LTG: Pt will perform car transfers with assist:: (upgrade due to progress) Independent Note: upgrade due to progress   Problem: RH Ambulation Goal: LTG Patient will ambulate in controlled environment (PT) Description: LTG: Patient will ambulate in a controlled environment, # of feet with assistance (PT). Flowsheets (Taken 04/02/2021 1209) LTG: Pt will ambulate in controlled environ  assist needed:: (upgrade due to progress) Independent Note: upgrade due to progress Goal: LTG Patient will ambulate in home environment (PT) Description: LTG: Patient will ambulate in home environment, # of feet with assistance (PT). Flowsheets (Taken 04/02/2021 1209) LTG: Pt will ambulate in home environ  assist needed:: (upgrade due to progress) Independent Note: upgrade due to progress   Problem: RH Stairs Goal: LTG Patient will ambulate up and down stairs w/assist (PT) Description: LTG: Patient will ambulate up and down # of stairs with assistance (PT) Flowsheets (Taken 04/02/2021 1209) LTG: Pt will ambulate up/down stairs assist needed:: (upgrade due to progress) Independent with assistive device Note: upgrade due to progress

## 2021-04-02 NOTE — Progress Notes (Signed)
Physical Therapy Weekly Progress Note  Patient Details  Name: Shannon [name] MRN: [id number] Date of Birth: Jun 20, 1981  Beginning of progress report period: March 25, 2021 End of progress report period: April 02, 2021  Today's Date: 04/02/2021 PT Individual Time: 0800-0900 PT Individual Time Calculation (min): 60 min   Patient has met 4 of 4 short term goals.  Pt is making good progress towards therapy goals. Pt is independent with bed mobility, mod I for transfers with no AD with increased time needed for safety, is able to ambulate 1000 ft (+) with no AD at Supervision level with cues for breathing and taking rest breaks as needed, and is Supervision level for stair navigation with use of one handrail. Pt has made significant improvements in her functional status over the past week. Pt's parents completed family education on 6/10 and her husband will come in for family education 6/13.  Patient continues to demonstrate the following deficits muscle weakness, decreased cardiorespiratoy endurance, and decreased standing balance, decreased postural control, and decreased balance strategies and therefore will continue to benefit from skilled PT intervention to increase functional independence with mobility.  Patient progressing toward long term goals..  Plan of care revisions: upgraded goals to independent from mod I due to great progress, stair goal upgraded from Supervision to mod I.  PT Short Term Goals Week 1:  PT Short Term Goal 1 (Week 1): Pt wil perform supine<>sit CGA PT Short Term Goal 1 - Progress (Week 1): Met PT Short Term Goal 2 (Week 1): Sit<>stand and stand pivot transfers using LRAD with CGA PT Short Term Goal 2 - Progress (Week 1): Met PT Short Term Goal 3 (Week 1): Pt will ambulate at least 86f using LRAD with CGA PT Short Term Goal 3 - Progress (Week 1): Met PT Short Term Goal 4 (Week 1): Pt will ascend/descend 4 steps using B HRs with mod assist PT Short Term Goal 4 - Progress  (Week 1): Met Week 2:  PT Short Term Goal 1 (Week 2): =LTG due to ELOS  Skilled Therapeutic Interventions/Progress Updates:   Pt received seated in bed, agreeable to PT session. Pt reports 7/10 pain in mid-low back on R side at rest, declines pain medication this AM. Reapplied kinesiotape to R/L lower back lateral to spine. Pt reports improvement in pain symptoms with use of ktape. Bed mobility independent. Sit to stand at mod I level with no AD throughout session. Standing balance while performing oral hygiene and while ktape being applied to back at Supervision level with no AD. Ambulation up to 1000 ft this session with no AD at Supervision level. Pt able to navigate functional environment on/off elevators, across uneven ground, and up/down inclines. Focus on breathing during gait and taking seated/standing rest breaks as needed. Ascend/descend 4 x 6" stairs with R handrail and close Supervision for balance. Pt left seated in bed with needs in reach at end of session.  Therapy Documentation Precautions:  Precautions Precautions: Back, Fall Restrictions Weight Bearing Restrictions: No    Therapy/Group: Individual Therapy   TExcell Seltzer PT, DPT, CSRS 04/02/2021, 12:06 PM

## 2021-04-03 LAB — CBC WITH DIFFERENTIAL/PLATELET
Abs Immature Granulocytes: 0.16 10*3/uL — ABNORMAL HIGH (ref 0.00–0.07)
Basophils Absolute: 0.1 10*3/uL (ref 0.0–0.1)
Basophils Relative: 0 %
Eosinophils Absolute: 0.4 10*3/uL (ref 0.0–0.5)
Eosinophils Relative: 2 %
HCT: 37 % (ref 36.0–46.0)
Hemoglobin: 11.6 g/dL — ABNORMAL LOW (ref 12.0–15.0)
Immature Granulocytes: 1 %
Lymphocytes Relative: 44 %
Lymphs Abs: 6.7 10*3/uL — ABNORMAL HIGH (ref 0.7–4.0)
MCH: 26.1 pg (ref 26.0–34.0)
MCHC: 31.4 g/dL (ref 30.0–36.0)
MCV: 83.1 fL (ref 80.0–100.0)
Monocytes Absolute: 0.8 10*3/uL (ref 0.1–1.0)
Monocytes Relative: 5 %
Neutro Abs: 7.1 10*3/uL (ref 1.7–7.7)
Neutrophils Relative %: 48 %
Platelets: 377 10*3/uL (ref 150–400)
RBC: 4.45 MIL/uL (ref 3.87–5.11)
RDW: 14.4 % (ref 11.5–15.5)
WBC: 15.1 10*3/uL — ABNORMAL HIGH (ref 4.0–10.5)
nRBC: 0 % (ref 0.0–0.2)

## 2021-04-03 LAB — BASIC METABOLIC PANEL
Anion gap: 5 (ref 5–15)
BUN: 14 mg/dL (ref 6–20)
CO2: 30 mmol/L (ref 22–32)
Calcium: 9 mg/dL (ref 8.9–10.3)
Chloride: 103 mmol/L (ref 98–111)
Creatinine, Ser: 1.05 mg/dL — ABNORMAL HIGH (ref 0.44–1.00)
GFR, Estimated: >60 mL/min (ref >60)
Glucose, Bld: 90 mg/dL (ref 70–99)
Potassium: 4.1 mmol/L (ref 3.5–5.1)
Sodium: 138 mmol/L (ref 135–145)

## 2021-04-03 MED ORDER — CARVEDILOL 6.25 MG PO TABS
6.2500 mg | ORAL_TABLET | Freq: Two times a day (BID) | ORAL | Status: DC
  Administered 2021-04-03 – 2021-04-06 (×6): 6.25 mg via ORAL
  Filled 2021-04-03 (×6): qty 1

## 2021-04-03 NOTE — Progress Notes (Signed)
Occupational Therapy Session Note  Patient Details  Name: Shannon [name] MRN: [id number] Date of Birth: 10-11-1981  Today's Date: 04/03/2021 OT Individual Time: 1470-9295 OT Individual Time Calculation (min): 39 min  Missed 21 mins of OT d/t phone call   Short Term Goals: Week 1:  OT Short Term Goal 1 (Week 1): Pt will don LB clothing with AE with S. OT Short Term Goal 1 - Progress (Week 1): Met OT Short Term Goal 2 (Week 1): Pt will be able to toilet with S. OT Short Term Goal 2 - Progress (Week 1): Met OT Short Term Goal 3 (Week 1): Pt will be able to tolerate standing at the sink to brush her teeth. OT Short Term Goal 3 - Progress (Week 1): Met Week 2:  OT Short Term Goal 1 (Week 2): STG=LTG 2/2 ELOS    Skilled Therapeutic Interventions/Progress Updates:    Pt greeted at time of scheduled OT session semireclined in bed resting on phone with family member, requesting a few minutes to finish up phone call. Pt finished with phone call approx 20 mins later, missed 20 mins of OT d/t phone call. Upon return, pt walking to bathroom with supervision for safety only d/t no AD, otherwise Indep with toilet transfer and shower bench transfer. Performed UB/LB bathing shower level with set up and good adherence to back precautions. Dried off same manner before walking to bed with Supervision no AD. Dressed with Set up as well for underwear, pad, pants, and shirt. Pt had question about morning med for BP/HR, relayed to nursing who was present. BP checked and relayed to nursing, note HR in 130s and RN aware. Hand off to RN.   Therapy Documentation Precautions:  Precautions Precautions: Back, Fall Restrictions Weight Bearing Restrictions: No     Therapy/Group: Individual Therapy  [name] 04/03/2021, 7:12 AM

## 2021-04-03 NOTE — Progress Notes (Signed)
Occupational Therapy Session Note  Patient Details  Name: Shannon [name] MRN: [id number] Date of Birth: 03-Jul-1981  Today's Date: 04/03/2021 OT Individual Time: 1330-1415 OT Individual Time Calculation (min): 45 min  Pt missed 15 mins secondary to elevated HR  Short Term Goals: Week 2:  OT Short Term Goal 1 (Week 2): STG=LTG 2/2 ELOS  Skilled Therapeutic Interventions/Progress Updates:    OT intervention with focus on functional amb without AD, tub and shower transfers, dynamic standing balance tasks, and family education with husband. All tranfsers and amb with distant supervision. Pt engaged in Wii balance tasks with supervision. HR 132 at end of session. Pt remained seated EOB resting. RN [name] H notified of HR.   Therapy Documentation Precautions:  Precautions Precautions: Back, Fall Restrictions Weight Bearing Restrictions: No General: General OT Amount of Missed Time: 15 Minutes Pain:  Pt requested Kinesio tape application to lower back lateral to incision for pain mgmt; pt reports her back feels "tired."  Therapy/Group: Individual Therapy  Leroy Libman 04/03/2021, 2:47 PM

## 2021-04-03 NOTE — Progress Notes (Signed)
Patient ID: Shannon [name], female   DOB: Jan 26, 1981, 40 y.o.   MRN: [id number] Met with pt she does not need a rolling walker. Feels ready for discharge 6/16.

## 2021-04-03 NOTE — Progress Notes (Signed)
PROGRESS NOTE   Subjective/Complaints:  Feels good this am , discussed improved WBC, as well as meds for tachycia and BP (COreg)   ROS:    Pt denies SOB, abd pain, CP, N/V/C/D, and vision changes  Objective:   No results found. Recent Labs    04/03/21 0642  WBC 15.1*  HGB 11.6*  HCT 37.0  PLT 377    Recent Labs    04/03/21 0642  NA 138  K 4.1  CL 103  CO2 30  GLUCOSE 90  BUN 14  CREATININE 1.05*  CALCIUM 9.0     Intake/Output Summary (Last 24 hours) at 04/03/2021 0929 Last data filed at 04/03/2021 0700 Gross per 24 hour  Intake 1008 ml  Output 0 ml  Net 1008 ml         Physical Exam: Vital Signs Blood pressure (!) 133/93, pulse (!) 121, temperature 98.9 F (37.2 C), temperature source Oral, resp. rate 18, height 5\' 2"  (1.575 m), weight 120.7 kg, SpO2 97 %, unknown if currently breastfeeding.   General: No acute distress Mood and affect are appropriate Heart: Regular rate and rhythm no rubs murmurs or extra sounds Lungs: Clear to auscultation, breathing unlabored, no rales or wheezes Abdomen: Positive bowel sounds, soft nontender to palpation, nondistended Extremities: No clubbing, cyanosis, or edema Skin: No evidence of breakdown, no evidence of rash  Musculoskeletal:        General: No swelling or tenderness.     Cervical back: Normal range of motion and neck supple.     Comments: No edema or tenderness in extremities  Skin:    General: Skin is warm and dry.     Comments: Back incision intact with very few steristrips- looks great- no drainage or erythema - looks great Neurological:     Mental Status: She is alert.     Comments: Alert and oriented Motor: Bilateral upper extremities: 5/5 proximal distal Bilateral lower extremities: Flexion, knee extension 4 -/5, ankle dorsiflexion 5/5 Sensation intact light touch     No results found for this or any previous visit (from the past 48  hour(s)). No results found.   Assessment/Plan: 1. Functional deficits which require 3+ hours per day of interdisciplinary therapy in a comprehensive inpatient rehab setting. Physiatrist is providing close team supervision and 24 hour management of active medical problems listed below. Physiatrist and rehab team continue to assess barriers to discharge/monitor patient progress toward functional and medical goals  Care Tool:  Bathing    Body parts bathed by patient: Right arm, Left arm, Chest, Abdomen, Front perineal area, Right upper leg, Left upper leg, Face, Buttocks, Right lower leg, Left lower leg         Bathing assist Assist Level: Set up assist     Upper Body Dressing/Undressing Upper body dressing   What is the patient wearing?: Pull over shirt    Upper body assist Assist Level: Set up assist    Lower Body Dressing/Undressing Lower body dressing      What is the patient wearing?: Pants, Underwear/pull up     Lower body assist Assist for lower body dressing: Supervision/Verbal cueing     Toileting Toileting  Toileting assist Assist for toileting: Minimal Assistance - Patient > 75%     Transfers Chair/bed transfer  Transfers assist  Chair/bed transfer activity did not occur: Safety/medical concerns (required use of RW)  Chair/bed transfer assist level: Independent with assistive device Chair/bed transfer assistive device: Other (increased time)   Locomotion Ambulation   Ambulation assist   Ambulation activity did not occur: Safety/medical concerns (requires use of RW)  Assist level: Supervision/Verbal cueing Assistive device: No Device Max distance: 1000   Walk 10 feet activity   Assist  Walk 10 feet activity did not occur: Safety/medical concerns  Assist level: Supervision/Verbal cueing Assistive device: No Device   Walk 50 feet activity   Assist Walk 50 feet with 2 turns activity did not occur: Safety/medical concerns  Assist  level: Supervision/Verbal cueing Assistive device: No Device    Walk 150 feet activity   Assist Walk 150 feet activity did not occur: Safety/medical concerns  Assist level: Supervision/Verbal cueing Assistive device: No Device    Walk 10 feet on uneven surface  activity   Assist Walk 10 feet on uneven surfaces activity did not occur: Safety/medical concerns   Assist level: Supervision/Verbal cueing Assistive device:  (no device)   Wheelchair     Assist Will patient use wheelchair at discharge?: No             Wheelchair 50 feet with 2 turns activity    Assist            Wheelchair 150 feet activity     Assist          Blood pressure (!) 133/93, pulse (!) 121, temperature 98.9 F (37.2 C), temperature source Oral, resp. rate 18, height 5\' 2"  (1.575 m), weight 120.7 kg, SpO2 97 %, unknown if currently breastfeeding.    Medical Problem List and Plan: 1.  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               pt may shower             -ELOS/Goals: 10-14 days/supervision/mod I           con't PT and OT- pt said cannot arrange to leave on 6/14- but can as we discussed on 6/16.   -con't PT and OT- d/c 6/16.  2.  Antithrombotics: -DVT/anticoagulation:  Pharmaceutical: Heparin 6/7- changed to lovenox- since 1x/day- d/w pt             -antiplatelet therapy: N/A 3. Pain Management: Continue Oxycodone prn             --On Gabapentin tid.              --will add baclofen tid prn. Continue flexeril at bedtime.   6/6- will add Flexeril 5 mg TID prn and change flexeril to 10 mg QHS- and monitor  6/7- pt actually liked Baclofen better than Flexeril- switched those meds and stopped Flexeril during day and gave Baclofen 5 mg TID prn.  6/12- pain controlled off oxy- con't regimen             Monitor with increased exertion 4. Mood: LCSW to follow for evaluation and support             -antipsychotic  agents: N/AA 5. Neuropsych: This patient is capable of making decisions on her own behalf. 6. Skin/Wound Care: Monitor incision for healing.             -- Routine  pressure-relief measures. 7. Fluids/Electrolytes/Nutrition: Monitor intake/output.             CMP stable, monitor weekly.  8.  Elevated blood pressure:              --Better controlled, continue to Monitor 3 times daily.  Hydralazine prn for 24 hours.   6/9- BP a little elevated- but usually controlled- con't regimen  6/0- BP 130s/100 this AM- doesn't seem to be stable- con't to monitor and will change meds if see a trend.   6/11- BP 140s/100s- has hydralazine prn- will start Norvasc 2.5 mg daily for BP.              Monitor with increased exertion 9. Neurogenic bladder: Incontinence has resolved but now with urgency/feeling of not emptying fully             PVRs ordered  6/6- PVRs are OK- but has also c/o bladder spasms-and WBC is elevated even more- will check U/A and Cx   6/9- tx'd with fosfomycin.  10. Anxiety/ADD/ADHD/Depression: has been under lot of stress.              --Continue Vyvanse and Cymbalta  6/8- change time to give together             Team support 11. Leukocytosis: likely 2/2 steroids, afebrile, repeat Monday.  6/6- up to 26k- UTI?- checking today! Will recheck labs in AM  6/7- Pt has UTI- also up due to steroids, but is stable /slightly better- treating with Fosfomycin-   6/8- Cx is multiple species, but pt feeling much better- will check CBC tomorrow, to make sure it's coming down and follow clinically  6/9- WBC still 25k- however no left shift- Neutrophils are 55- not elevated.  12. Ependymoma: pathology results discussed with patient. Patient would like to learn more about this tumor and discuss treatment plan with oncology as soon as possible.  6/7- spoke with Oncology and they will get back to me about plan.   6/9- they plan on seeing her after d/c.   6/11- PA and I concerned about WBC and basophilia-  since was nml when admitted, Onc said will just see outpt.  13. UTI  6/7- Culture pending- since allergic to Cephalosporins -hives, and Bactrim- hives, called pharmacy and discussed- agreed on Fosfomycin- 1x and will wait for urine Cx.   6/8- s/p fosfomycin- Cx shows multiple species- as above.   WBC improved from 25K to 15K on 6/13 cont to monitor  14. Constipation  6/8- had a good BM today- abd tightness resolved- will con't bowel regimen and monitor  6/9- doing better- con't regimen  6/11- resolved- off oxycodone- con't regimen 15. Tachycardia/Elevated BP- EKG showed sinus tach on 5/24 otherwise normal   6/12- will d/c Norvasc that was started yesterday and add Coreg 3.125 mg BID for both issues and monitor- might need increase dose.   Vitals:   04/03/21 0829 04/03/21 0852  BP: (!) 154/99 (!) 133/93  Pulse: (!) 135 (!) 121  Resp: 18 18  Temp: 98.9 F (37.2 C)   SpO2: 98% 97%   Still tachy with elevated BP will increase to 6.25mg  BID  LOS: 10 days A FACE TO FACE EVALUATION WAS PERFORMED  Shannon [name] 04/03/2021, 9:29 AM

## 2021-04-03 NOTE — Progress Notes (Signed)
Patient slept well throughout the night. No c/o pain or discomfort. Reports that menstrual cycle on, however not visualized by this Probation officer. Voided x2, with bladder scan post void-11ml.

## 2021-04-03 NOTE — Progress Notes (Signed)
Patient-reported Beta-Lactam Allergy Assessment  Specific drug that caused reaction: Ceftriaxone Reaction(s) that occurred: focal rash without SOB  Antibiotics tolerated in the past: Patient reported:  Amoxicillin and Penicillin Historical data obtained from the EMR:  Ceftriaxone  Information received from:  Patient and Medical record  Spoke with patient about reported ceftriaxone allergy. After discussing, she is willing to try a cephalosporin again if needed. Also, has tolerated PCN in past so this would be an appropriate medication class to use.  Thank you for involving pharmacy in this patient's care.  Renold Genta, PharmD, BCPS Clinical Pharmacist Clinical phone for 04/03/2021 until 3p is [phone] 04/03/2021 2:56 PM  **Pharmacist phone directory can be found on [email or web address] listed under [place]**

## 2021-04-03 NOTE — Progress Notes (Signed)
Physical Therapy Session Note  Patient Details  Name: Shannon [name] MRN: [id number] Date of Birth: Mar 03, 1981  Today's Date: 04/03/2021 PT Individual Time: 1445-1525 PT Individual Time Calculation (min): 40 min  PT Amount of Missed Time (min): 20 Minutes PT Missed Treatment Reason: Unavailable (Comment) (dealing with personal family matter)  Short Term Goals:  Week 2:  PT Short Term Goal 1 (Week 2): =LTG due to ELOS   Skilled Therapeutic Interventions/Progress Updates:    Pt received seated EOB with husband present for family education. Pt requesting 10-15 min at beginning of session for dealing with family matter. This therapist returns at 1445 and pt and husband ready for family education session. Pt is mod I for all transfers with no AD, increased time needed and some guarded movement due to R back pain. Pt had kinesiotape reapplied during OT session for pain management. Pt also requesting muscle relaxer from nursing at end of session, provided patient with short-acting hot pack as well. Ambulation x 300 ft with no AD at Supervision level. Ascend/descend 12 x 6" stairs with R handrail and Supervision. Discussed where to stand to safely provide Supervision assist to patient while she is performing stairs. Introduced treadmill this session as pt owns a treadmill at home and wanting to perform gait on it when it is too hot to ambulate outdoors. Pt is able to step onto treadmill with Supervision assist. Ambulation on treadmill at 1.0 mph x 10 min, 875 ft completed. Heart rate 138 bpm while ambulating on treadmill. Focused on pursed lip breathing during exercise. Pt exhibits decreased ability to take fluid steps with RLE with onset of fatigue and reports significant increase in R low back pain following treadmill. Pt requires min HHA to step down from treadmill following her workout due to pain. Pt returned to room at end of session. Seated heart rate 126 bpm at end of session.Toilet transfer mod I. Pt  left seated EOB in care of nursing at end of session.  Therapy Documentation Precautions:  Precautions Precautions: Back, Fall Restrictions Weight Bearing Restrictions: No    Therapy/Group: Individual Therapy   Excell Seltzer, PT, DPT, CSRS  04/03/2021, 5:34 PM

## 2021-04-03 NOTE — Progress Notes (Signed)
Occupational Therapy Session Note  Patient Details  Name: Shannon [name] MRN: [id number] Date of Birth: Feb 27, 1981  Today's Date: 04/03/2021 OT Individual Time: 3267-1245 OT Individual Time Calculation (min): 40 min    Short Term Goals: Week 2:  OT Short Term Goal 1 (Week 2): STG=LTG 2/2 ELOS  Skilled Therapeutic Interventions/Progress Updates:    Pt resting in bed upon arrival and agreeable to getting OOB for therapy. Pt amb without AD to Day Room and engaged in Wii balance activities: penguin ice float with R/L weight shifts, tilt table requiring more subtle weight shifts, and ski slalom requiring subtle weight shifts with quick reactions. Pt exhibited increased difficulty with anterior weight shifts and Lt weight shifts. Pt uses UB to initiate weight shifts. Pt completed all tasks with supervision. Pt returned to room and remained seated EOB. All needs within reach.   Therapy Documentation Precautions:  Precautions Precautions: Back, Fall Restrictions Weight Bearing Restrictions: No Pain: Pain Assessment Pain Scale: 0-10 Pain Score: 0-No pain   Therapy/Group: Individual Therapy  Leroy Libman 04/03/2021, 12:17 PM

## 2021-04-04 NOTE — Progress Notes (Signed)
Occupational Therapy Session Note  Patient Details  Name: Shannon [name] MRN: [id number] Date of Birth: 1981/01/13  Today's Date: 04/04/2021 OT Individual Time: 0525-9102 OT Individual Time Calculation (min): 40 min  and Today's Date: 04/04/2021 OT Missed Time: 20 Minutes Missed Time Reason: Other (comment) (eating breakfast)   Short Term Goals: Week 1:  OT Short Term Goal 1 (Week 1): Pt will don LB clothing with AE with S. OT Short Term Goal 1 - Progress (Week 1): Met OT Short Term Goal 2 (Week 1): Pt will be able to toilet with S. OT Short Term Goal 2 - Progress (Week 1): Met OT Short Term Goal 3 (Week 1): Pt will be able to tolerate standing at the sink to brush her teeth. OT Short Term Goal 3 - Progress (Week 1): Met Week 2:  OT Short Term Goal 1 (Week 2): STG=LTG 2/2 ELOS    Skilled Therapeutic Interventions/Progress Updates:    Pt greeted at time of session semireclined in bed eating breakfast, just starting to eat. Pt given a few minutes at beginning of session to eat breakfast, remained in semireclined position during this time. Missed 20 mins d/t eating breakfast. After eating, engaged pt in conversation regarding DC planning, her outing today, and going home. Pt becoming emotional at this time d/t concerns about relationship when going home, provided emotional support throughout session. Pt stating she wanted additional K tape application to lower spinal muscles, old K tape removed and reapplied to low back with pt reporting improvement. Remainder of session spent with pt in conversation for motivational interviewing and emotional coping in order to DC plan to return home. Note pt in unsupported sitting throughout education, politely declined ADL today. Hand off to nursing for med pass.  Therapy Documentation Precautions:  Precautions Precautions: Back, Fall Restrictions Weight Bearing Restrictions: No    Therapy/Group: Individual Therapy  [name] 04/04/2021, 7:05  AM

## 2021-04-04 NOTE — Progress Notes (Signed)
Physical Therapy Session Note  Patient Details  Name: Shannon [name] MRN: [id number] Date of Birth: August 09, 1981  Today's Date: 04/04/2021 PT Individual Time: 5749-3552 PT Individual Time Calculation (min): 40 min   Short Term Goals: Week 1:  PT Short Term Goal 1 (Week 1): Pt wil perform supine<>sit CGA PT Short Term Goal 1 - Progress (Week 1): Met PT Short Term Goal 2 (Week 1): Sit<>stand and stand pivot transfers using LRAD with CGA PT Short Term Goal 2 - Progress (Week 1): Met PT Short Term Goal 3 (Week 1): Pt will ambulate at least 25f using LRAD with CGA PT Short Term Goal 3 - Progress (Week 1): Met PT Short Term Goal 4 (Week 1): Pt will ascend/descend 4 steps using B HRs with mod assist PT Short Term Goal 4 - Progress (Week 1): Met Week 2:  PT Short Term Goal 1 (Week 2): =LTG due to ELOS Week 3:     Skilled Therapeutic Interventions/Progress Updates:    Pain:  Pt reports  pain.  Treatment to tolerance.  Rest breaks and repositioning as needed.  Pt initially supine and agreeable to treatment session w/focus on spinal stabilization exercises. Gait 1539fw/supervision to gym  Pt instructed w/and performed the following: Seated pelvic tilts Standing w/back at wall bilat UE bentle "wall angels:  for postural strengthening. Seated pelvic tilts Quadruped gentlt cat/camel Quadruped alternating arm raises w/hile maintaining neutral spine via abdominal activation. Supine single knees to chest. Supine  gentle piriformis stretching Pt c/o increased pain R hip/hamstrings, describes as "spasms" although  no palpable mm spasms in hip musculature/gluts/pirformis/or hamstrings, gentle hamstring stretching performed. Supine long trunk stretch via overhead UES and extended Les w/cues to lengthen spine Clamshells Supine pelvic tilts. Pt instructed w/use of soft small ball for light pressure to priformis for light soft tissue mobilization.   Gait >40042fs cooldown activity.  Pt gradually  states pain has resolved from percieved "spasms".   Pt transfers stand to sit to supine w/hob elevated w/supervision. Pt left supine w/rails up x 3, alarm set, bed in lowest position, and needs in reach.   Therapy Documentation Precautions:  Precautions Precautions: Back, Fall Restrictions Weight Bearing Restrictions: No     Therapy/Group: Individual Therapy BarCallie FieldingT [place]14/2022, 12:34 PM

## 2021-04-04 NOTE — Progress Notes (Signed)
Occupational Therapy Session Note  Patient Details  Name: Shannon [name] MRN: [id number] Date of Birth: 28-Jul-1981  Today's Date: 04/04/2021 OT Individual Time: 1130-1159 OT Individual Time Calculation (min): 29 min    Short Term Goals: Week 2:  OT Short Term Goal 1 (Week 2): STG=LTG 2/2 ELOS  Skilled Therapeutic Interventions/Progress Updates:    OT intervention with focus on functional amb without AD and dynamic standing balance. HR at beginning of session-114. Pt amb to Day Room and engaged in Wii bowling game using step forward to release ball.Pt maintained balance throughout session. HR at end of game-122 but recovered to 96 in 30 secs. Pt amb back to room. HR-132 and recovered to 114 in 45 secs. Pt remained seated EOB. All needs within reach.   Therapy Documentation Precautions:  Precautions Precautions: Back, Fall Restrictions Weight Bearing Restrictions: No  Pain: Pain Assessment Pain Scale: 0-10 Pain Score: 0-No pain   Therapy/Group: Individual Therapy  Leroy Libman 04/04/2021, 12:00 PM

## 2021-04-04 NOTE — Patient Care Conference (Signed)
Inpatient RehabilitationTeam Conference and Plan of Care Update Date: 04/04/2021   Time: 11:01 AM    Patient Name: Shannon [name]      Medical Record Number: [id number]  Date of Birth: 1981/01/31 Sex: Female         Room/Bed: 4M03C/4M03C-01 Payor Info: Payor: [place] / Plan: [facility] PPO / Product Type: *No Product type* /    Admit Date/Time:  03/24/2021  2:46 PM  Primary Diagnosis:  Lumbar radiculitis  Hospital Problems: Principal Problem:   Lumbar radiculitis    Expected Discharge Date: Expected Discharge Date: 04/06/21  Team Members Present: Physician leading conference: Dr. Courtney Heys Care Coodinator Present: Dorthula Nettles, RN, BSN, CRRN;Becky Dupree, LCSW Nurse Present: Dorthula Nettles, RN PT Present: Excell Seltzer, PT OT Present: Roanna Epley, COTA;Jennifer Tamala Julian, OT PPS Coordinator present : Ileana Ladd, PT     Current Status/Progress Goal Weekly Team Focus  Bowel/Bladder   Continent of B/B/ LBM 04/01/2021  Remain continent  Assess Qshift and PRN   Swallow/Nutrition/ Hydration             ADL's   CGA/mod I overall  mod I overall  discharge planning, safety awareness, education   Mobility   independent bed mobility, mod I transfers, Supervision gait with no AD, Supervision stairs with one handrail  upgraded to mod I to independent overall  family edu, d/c planning, community outing   Communication             Safety/Cognition/ Behavioral Observations            Pain   No c/o pain  keep pain level under 3/10  Assess pain qshift and prn   Skin   Skin intact  Maintain skin integrity.  Assess Qshift and PRN     Discharge Planning:  D/c to home with intermittent support from husband as he works 3rd shift; reports support from various family members, co-workers, and friends that will assist. [place] edu completed on 6/10 with parents, and 6/13 with pt husband. Pt set up for OPT PT at [facility] location.   Team Discussion: HR  elevated, BP elevated, back incision looks great, she is mainly taking Baclofen. Continent B/B, minimal complaints of pain.  Patient on target to meet rehab goals: yes, family education complete. Upgrading goals to Mod I / Independent. Supervision for stairs.  *See Care Plan and progress notes for long and short-term goals.   Revisions to Treatment Plan:  MD making medication adjustments.  Teaching Needs: Family education, medication management, pain management, skin/wound care, transfer training, gait training, stair training, balance training, endurance training, safety awareness.  Current Barriers to Discharge: Decreased caregiver support, Medical stability, Home enviroment access/layout, Lack of/limited family support, Weight bearing restrictions, Medication compliance, and Behavior  Possible Resolutions to Barriers: Continue current medications, provide emotional support.     Medical Summary Current Status: continent x2; pain controlled- no Oxy; tachycardia and elevated BP.  Barriers to Discharge: Home enviroment access/layout;Weight bearing restrictions;Weight;Medical stability;Wound care  Barriers to Discharge Comments: has family training yesterday- doesn't need RW; supervision for stairs; Possible Resolutions to [employer] Focus: increased Coreg for tachycardia and BP- focus on those this week- d/c 6/16/   Continued Need for Acute Rehabilitation Level of Care: The patient requires daily medical management by a physician with specialized training in physical medicine and rehabilitation for the following reasons: Direction of a multidisciplinary physical rehabilitation program to maximize functional independence : Yes Medical management of patient stability for increased activity during  participation in an intensive rehabilitation regime.: Yes Analysis of laboratory values and/or radiology reports with any subsequent need for medication adjustment and/or medical intervention. :  Yes   I attest that I was present, lead the team conference, and concur with the assessment and plan of the team.   Cristi Loron 04/04/2021, 4:16 PM

## 2021-04-04 NOTE — Progress Notes (Signed)
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   Also, heart rate still up- increased Coreg yesterday to 6.25 mg BID- hasn't really had a chance to work yet- only 1 dose so far.   ROS:   Pt denies SOB, abd pain, CP, N/V/C/D, and vision changes   Objective:   No results found. Recent Labs    04/03/21 0642  WBC 15.1*  HGB 11.6*  HCT 37.0  PLT 377    Recent Labs    04/03/21 0642  NA 138  K 4.1  CL 103  CO2 30  GLUCOSE 90  BUN 14  CREATININE 1.05*  CALCIUM 9.0     Intake/Output Summary (Last 24 hours) at 04/04/2021 0906 Last data filed at 04/04/2021 0800 Gross per 24 hour  Intake 990 ml  Output 0 ml  Net 990 ml        Physical Exam: Vital Signs Blood pressure (!) 142/95, pulse (!) 109, temperature 98.6 F (37 C), temperature source Oral, resp. rate 17, height 5\' 2"  (1.575 m), weight 120.7 kg, SpO2 100 %, unknown if currently breastfeeding.   General: awake, alert, appropriate, sitting up in bed; NAD HENT: conjugate gaze; oropharynx moist CV: regular rhythm; tachycardic rate; no JVD Pulmonary: CTA B/L; no W/R/R- good air movement GI: soft, NT, ND, (+)BS Psychiatric: appropriate; interactive Neurological: alert Musculoskeletal:        General: No swelling or tenderness.     Cervical back: Normal range of motion and neck supple.     Comments: No edema or tenderness in extremities  Skin:    General: Skin is warm and dry.     Comments: back incision almost completely scarred now.  Neurological:     Mental Status: She is alert.     Comments: Alert and oriented Motor: Bilateral upper extremities: 5/5 proximal distal Bilateral lower extremities: Flexion, knee extension 4 -/5, ankle dorsiflexion 5/5 Sensation intact light touch     No results found for this or any previous visit (from the past 48 hour(s)). No  results found.   Assessment/Plan: 1. Functional deficits which require 3+ hours per day of interdisciplinary therapy in a comprehensive inpatient rehab setting. Physiatrist is providing close team supervision and 24 hour management of active medical problems listed below. Physiatrist and rehab team continue to assess barriers to discharge/monitor patient progress toward functional and medical goals  Care Tool:  Bathing    Body parts bathed by patient: Right arm, Left arm, Chest, Abdomen, Front perineal area, Right upper leg, Left upper leg, Face, Buttocks, Right lower leg, Left lower leg         Bathing assist Assist Level: Set up assist     Upper Body Dressing/Undressing Upper body dressing   What is the patient wearing?: Pull over shirt    Upper body assist Assist Level: Set up assist    Lower Body Dressing/Undressing Lower body dressing      What is the patient wearing?: Pants, Underwear/pull up     Lower body assist Assist for lower body dressing: Set up assist  Toileting Toileting    Toileting assist Assist for toileting: Minimal Assistance - Patient > 75%     Transfers Chair/bed transfer  Transfers assist  Chair/bed transfer activity did not occur: Safety/medical concerns (required use of RW)  Chair/bed transfer assist level: Independent with assistive device Chair/bed transfer assistive device: Other (increased time)   Locomotion Ambulation   Ambulation assist   Ambulation activity did not occur: Safety/medical concerns (requires use of RW)  Assist level: Supervision/Verbal cueing Assistive device: No Device Max distance: 300'   Walk 10 feet activity   Assist  Walk 10 feet activity did not occur: Safety/medical concerns  Assist level: Supervision/Verbal cueing Assistive device: No Device   Walk 50 feet activity   Assist Walk 50 feet with 2 turns activity did not occur: Safety/medical concerns  Assist level: Supervision/Verbal  cueing Assistive device: No Device    Walk 150 feet activity   Assist Walk 150 feet activity did not occur: Safety/medical concerns  Assist level: Supervision/Verbal cueing Assistive device: No Device    Walk 10 feet on uneven surface  activity   Assist Walk 10 feet on uneven surfaces activity did not occur: Safety/medical concerns   Assist level: Supervision/Verbal cueing Assistive device:  (no device)   Wheelchair     Assist Will patient use wheelchair at discharge?: No             Wheelchair 50 feet with 2 turns activity    Assist            Wheelchair 150 feet activity     Assist          Blood pressure (!) 142/95, pulse (!) 109, temperature 98.6 F (37 C), temperature source Oral, resp. rate 17, height 5\' 2"  (1.575 m), weight 120.7 kg, SpO2 100 %, unknown if currently breastfeeding.    Medical Problem List and Plan: 1.  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               pt may shower             -ELOS/Goals: 10-14 days/supervision/mod I           con't PT and OT- pt said cannot arrange to leave on 6/14- but can as we discussed on 6/16.   -con't PT and OT- needs family training before d/c- con't Ktape for back pain.  2.  Antithrombotics: -DVT/anticoagulation:  Pharmaceutical: Heparin 6/7- changed to lovenox- since 1x/day- d/w pt             -antiplatelet therapy: N/A 3. Pain Management: Continue Oxycodone prn             --On Gabapentin tid.              --will add baclofen tid prn. Continue flexeril at bedtime.   6/6- will add Flexeril 5 mg TID prn and change flexeril to 10 mg QHS- and monitor  6/7- pt actually liked Baclofen better than Flexeril- switched those meds and stopped Flexeril during day and gave Baclofen 5 mg TID prn.  66/14- using Ktape for back pain- no pain meds- just baclofen prn             Monitor with increased exertion 4. Mood: LCSW to follow for  evaluation and support             -antipsychotic agents: N/AA 5. Neuropsych: This patient is capable of making decisions on her own behalf. 6. Skin/Wound Care:  Monitor incision for healing.             -- Routine pressure-relief measures. 7. Fluids/Electrolytes/Nutrition: Monitor intake/output.             CMP stable, monitor weekly.  8.  Elevated blood pressure:              --Better controlled, continue to Monitor 3 times daily.  Hydralazine prn for 24 hours.   6/9- BP a little elevated- but usually controlled- con't regimen  6/0- BP 130s/100 this AM- doesn't seem to be stable- con't to monitor and will change meds if see a trend.   6/11- BP 140s/100s- has hydralazine prn- will start Norvasc 2.5 mg daily for BP.   6/14 changed to Coreg- 6.25 mg BID- still elevated- will monitor             Monitor with increased exertion 9. Neurogenic bladder: Incontinence has resolved but now with urgency/feeling of not emptying fully             PVRs ordered  6/6- PVRs are OK- but has also c/o bladder spasms-and WBC is elevated even more- will check U/A and Cx   6/9- tx'd with fosfomycin.  10. Anxiety/ADD/ADHD/Depression: has been under lot of stress.              --Continue Vyvanse and Cymbalta  6/8- change time to give together             Team support 11. Leukocytosis: likely 2/2 steroids, afebrile, repeat Monday.  6/6- up to 26k- UTI?- checking today! Will recheck labs in AM  6/7- Pt has UTI- also up due to steroids, but is stable /slightly better- treating with Fosfomycin-   6/8- Cx is multiple species, but pt feeling much better- will check CBC tomorrow, to make sure it's coming down and follow clinically  6/9- WBC still 25k- however no left shift- Neutrophils are 55- not elevated.  12. Ependymoma: pathology results discussed with patient. Patient would like to learn more about this tumor and discuss treatment plan with oncology as soon as possible.  6/7- spoke with Oncology and they will get  back to me about plan.   6/9- they plan on seeing her after d/c.   6/11- PA and I concerned about WBC and basophilia- since was nml when admitted, Onc said will just see outpt.  13. UTI  6/7- Culture pending- since allergic to Cephalosporins -hives, and Bactrim- hives, called pharmacy and discussed- agreed on Fosfomycin- 1x and will wait for urine Cx.   6/8- s/p fosfomycin- Cx shows multiple species- as above.   WBC improved from 25K to 15K on 6/13 cont to monitor  14. Constipation  6/8- had a good BM today- abd tightness resolved- will con't bowel regimen and monitor  6/9- doing better- con't regimen  6/11- resolved- off oxycodone- con't regimen 15. Tachycardia/Elevated BP- EKG showed sinus tach on 5/24 otherwise normal   6/12- will d/c Norvasc that was started yesterday and add Coreg 3.125 mg BID for both issues and monitor- might need increase dose.   6/14- BP doing ale better- was 123/83, but back up right before BP meds to 142/95- pulse 94 to 109- Coreg just increased- so will con't for now.   Vitals:   04/04/21 0440 04/04/21 0759  BP: 123/83 (!) 142/95  Pulse: 94 (!) 109  Resp: 17 17  Temp: 98.2 F (36.8 C) 98.6 F (37 C)  SpO2: 100% 100%   LOS: 11 days A  FACE TO FACE EVALUATION WAS PERFORMED  Oreste Majeed 04/04/2021, 9:06 AM

## 2021-04-04 NOTE — Progress Notes (Addendum)
Patient ID: Shannon [name], female   DOB: 10-15-1981, 40 y.o.   MRN: [id number] Husband was in for education yesterday and it went well. Pt feels ready for DC tomorrow. Has all needs. Aware of team conference update. DC needs addressed.

## 2021-04-04 NOTE — Progress Notes (Signed)
Occupational Therapy Discharge Summary  Patient Details  Name: Shannon [name] MRN: [id number] Date of Birth: 08-10-1981   Patient has met 10 of 10 long term goals due to improved activity tolerance, improved balance, postural control, ability to compensate for deficits, and improved coordination.  Pt made excellent progress with BADLs and IADLs during this admission. Pt completes bathing/dressing tasks without assistance using AE appropriately. Pt adheres to back precautions independently. Pt is I for functional transfers and functional amb within home envirionment. Pt completes simple home mgmt tasks and kitchen tasks without assitance. Pt's husband, mother, and father have been present and participated in therapy. Patient to discharge at overall Independent level.  Patient's care partner  to provide the necessary physical assistance at discharge.     Recommendation:  No follow up OT recommended  Equipment: No equipment provided  Reasons for discharge: treatment goals met and discharge from hospital  Patient/family agrees with progress made and goals achieved: Yes  OT Discharge   Vision Baseline Vision/History: Wears glasses Wears Glasses: At all times Patient Visual Report: No change from baseline Vision Assessment?: No apparent visual deficits Perception  Perception: Within Functional Limits Praxis Praxis: Intact Cognition Overall Cognitive Status: Within Functional Limits for tasks assessed Arousal/Alertness: Awake/alert Orientation Level: Oriented X4 Attention: Focused;Sustained;Selective Focused Attention: Appears intact Selective Attention: Appears intact Memory: Appears intact Awareness: Appears intact Problem Solving: Appears intact Safety/Judgment: Appears intact Sensation Sensation Light Touch: Appears Intact Hot/Cold: Appears Intact Proprioception: Appears Intact Stereognosis: Not tested Coordination Gross Motor Movements are Fluid and Coordinated: Yes Fine  Motor Movements are Fluid and Coordinated: Yes Motor  Motor Motor: Within Functional Limits    Trunk/Postural Assessment  Cervical Assessment Cervical Assessment: Within Functional Limits Thoracic Assessment Thoracic Assessment: Within Functional Limits Lumbar Assessment Lumbar Assessment: Exceptions to [facility] (back precautions) Postural Control Postural Control: Within Functional Limits  Balance Static Sitting Balance Static Sitting - Balance Support: Feet supported Static Sitting - Level of Assistance: 7: Independent Dynamic Sitting Balance Dynamic Sitting - Balance Support: During functional activity Dynamic Sitting - Level of Assistance: 7: Independent Static Standing Balance Static Standing - Balance Support: During functional activity Static Standing - Level of Assistance: 7: Independent Dynamic Standing Balance Dynamic Standing - Balance Support: During functional activity;Bilateral upper extremity supported Dynamic Standing - Level of Assistance: 7: Independent Extremity/Trunk Assessment RUE Assessment RUE Assessment: Within Functional Limits LUE Assessment LUE Assessment: Within Functional Limits   Leroy Libman 04/04/2021, 6:16 AM

## 2021-04-04 NOTE — Progress Notes (Signed)
Physical Therapy Session Note  Patient Details  Name: Shannon [name] MRN: [id number] Date of Birth: [birth year]  Today's Date: 04/04/2021 PT Individual Time: 9449-6759 PT Individual Time Calculation (min): 90 min   Short Term Goals: Week 2:  PT Short Term Goal 1 (Week 2): =LTG due to ELOS  Skilled Therapeutic Interventions/Progress Updates:    Pt received seated EOB in room, ready for therapy session. Seated HR 116 bpm at beginning of session. No complaints of pain at rest, has onset of back pain located along spine towards end of therapy session following extensive ambulation, resolves with seated rest break. Session focus on community outing with patient and recreational therapist to [employer]. Pt performs all sit to stand transfers at mod I level with increased time needed. Pt is CGA for ascending/descend steps in/out of rehab van due to step height and configuration. Ambulation in community setting of retail store at Supervision level overall with use of shopping cart for balance and energy conservation. Pt able to navigate distracting environment with tight spaces, multiple obstacles, other shoppers, etc. Pt able to problem solve public restroom use including determining handicap height toilet is safest to transfer from at this point. Pt able to recall 3/3 back precautions and adhere to precautions during outing with increased time and min cueing needed for safety when reaching for items from shelves. Standing HR 124 bpm following ambulation around store. Pt able to ambulate x 300 ft across parking lot from the store back to rehab [place] following shopping trip at Supervision level with no AD. Pt exhibits decreased overall gait speed. Following outing pt states she feels worn out and going forwards would likely take a seated rest break between shopping trip and walking back to her vehicle, shows good insight into energy conservation and when to take rest breaks appropriately. Pt returned to her room at end  of session. Toilet transfer at mod I level. Seated HR 116 bpm at end of session. Cotreatment session with LRT. See outing goal sheet in shadow chart for full details of goals achieved during community outing.  Therapy Documentation Precautions:  Precautions Precautions: Back, Fall Restrictions Weight Bearing Restrictions: No      Therapy/Group: Individual Therapy   Excell Seltzer, PT, DPT, CSRS  04/04/2021, 5:15 PM

## 2021-04-04 NOTE — Progress Notes (Signed)
Recreational Therapy Session Note  Patient Details  Name: Shannon [name] MRN: [id number] Date of Birth: 08-10-1981 Today's Date: 04/04/2021  Pain: c/o of back pain, unrated after walking longer distances in community, rest assisted with relief Skilled Therapeutic Interventions/Progress Updates: Pt participated in Community reintegration/outing to [employer] at overall Supervision ambulatory level without AD.  Goals focused on safe community mobility, identification & negotiation of obstacles, accessing public restroom, energy conservation techniques/education.  See outing goal sheet in shadow chart for full details.   Therapy/Group: [employer] 04/04/2021, 3:36 PM

## 2021-04-05 ENCOUNTER — Other Ambulatory Visit (HOSPITAL_COMMUNITY): Payer: Self-pay

## 2021-04-05 DIAGNOSIS — I10 Essential (primary) hypertension: Secondary | ICD-10-CM

## 2021-04-05 DIAGNOSIS — R000 Tachycardia, unspecified: Secondary | ICD-10-CM

## 2021-04-05 HISTORY — DX: Essential (primary) hypertension: I10

## 2021-04-05 MED ORDER — IBUPROFEN 800 MG PO TABS
800.0000 mg | ORAL_TABLET | Freq: Three times a day (TID) | ORAL | 0 refills | Status: DC | PRN
  Filled 2021-04-05: qty 15, 5d supply, fill #0

## 2021-04-05 MED ORDER — BACLOFEN 5 MG PO TABS
1.0000 | ORAL_TABLET | Freq: Three times a day (TID) | ORAL | 0 refills | Status: DC | PRN
  Filled 2021-04-05: qty 60, 20d supply, fill #0

## 2021-04-05 MED ORDER — PANTOPRAZOLE SODIUM 40 MG PO TBEC
40.0000 mg | DELAYED_RELEASE_TABLET | Freq: Every day | ORAL | 0 refills | Status: DC
  Filled 2021-04-05: qty 30, 30d supply, fill #0

## 2021-04-05 MED ORDER — CYCLOBENZAPRINE HCL 10 MG PO TABS
10.0000 mg | ORAL_TABLET | Freq: Every day | ORAL | 0 refills | Status: DC
  Filled 2021-04-05: qty 30, 30d supply, fill #0

## 2021-04-05 MED ORDER — GABAPENTIN 300 MG PO CAPS
300.0000 mg | ORAL_CAPSULE | Freq: Three times a day (TID) | ORAL | 0 refills | Status: DC
  Filled 2021-04-05: qty 90, 30d supply, fill #0

## 2021-04-05 MED ORDER — CARVEDILOL 6.25 MG PO TABS
6.2500 mg | ORAL_TABLET | Freq: Two times a day (BID) | ORAL | 0 refills | Status: DC
  Filled 2021-04-05: qty 60, 30d supply, fill #0

## 2021-04-05 MED ORDER — MELATONIN 5 MG PO TABS
10.0000 mg | ORAL_TABLET | Freq: Every day | ORAL | 0 refills | Status: DC
  Filled 2021-04-05: qty 60, 30d supply, fill #0

## 2021-04-05 NOTE — Progress Notes (Signed)
Physical Therapy Discharge Summary  Patient Details  Name: Shannon [name] MRN: [id number] Date of Birth: 1981/10/01   Patient has met 9 of 9 long term goals due to improved activity tolerance, improved balance, improved postural control, increased strength, decreased pain, and ability to compensate for deficits.  Patient to discharge at an ambulatory level Independent.   Patient's care partner is independent to provide the necessary physical assistance at discharge. Pt's family has completed family education although patient is independently with mobility and requires no physical assistance at d/c.  Reasons goals not met: Patient has met all rehab goals.  Recommendation:  Patient will benefit from ongoing skilled PT services in outpatient setting to continue to advance safe functional mobility, address ongoing impairments in endurance, safety, strength, and minimize fall risk.  Equipment: No equipment provided  Reasons for discharge: treatment goals met and discharge from hospital  Patient/family agrees with progress made and goals achieved: Yes  PT Discharge Precautions/Restrictions Precautions Precautions: Back;Fall Precaution Comments: pt able to recall 3/3 back precautions Restrictions Weight Bearing Restrictions: No Vision/Perception  Vision - History Baseline Vision: No visual deficits Perception Perception: Within Functional Limits Praxis Praxis: Intact  Cognition Overall Cognitive Status: Within Functional Limits for tasks assessed Arousal/Alertness: Awake/alert Orientation Level: Oriented X4 Attention: Focused;Sustained;Selective Focused Attention: Appears intact Sustained Attention: Appears intact Selective Attention: Appears intact Memory: Appears intact Awareness: Appears intact Problem Solving: Appears intact Safety/Judgment: Appears intact Sensation Sensation Light Touch: Appears Intact Proprioception: Appears Intact Coordination Gross Motor Movements  are Fluid and Coordinated: Yes Fine Motor Movements are Fluid and Coordinated: Yes Coordination and Movement Description: much improved since eval Motor  Motor Motor: Within Functional Limits Motor - Discharge Observations: [facility]  Mobility Bed Mobility Bed Mobility: Rolling Right;Rolling Left;Supine to Sit;Sit to Supine Rolling Right: Independent Rolling Left: Independent Supine to Sit: Independent Sit to Supine: Independent Transfers Transfers: Sit to Stand;Stand to [employer] Transfers Sit to Stand: Independent Stand to Sit: Independent Stand Pivot Transfers: Independent Transfer (Assistive device): None Locomotion  Gait Ambulation: Yes Gait Assistance: Independent Gait Distance (Feet): 1000 Feet Assistive device: None Gait Gait: Yes Gait Pattern: Impaired Gait Pattern: Wide base of support Gait velocity: decreased Stairs / Additional Locomotion Stairs: Yes Stairs Assistance: Independent with assistive device Stair Management Technique: One rail Right;Alternating pattern Number of Stairs: 36 Height of Stairs: 6 Ramp: Independent Curb: Independent Wheelchair Mobility Wheelchair Mobility: No  Trunk/Postural Assessment  Cervical Assessment Cervical Assessment: Within Functional Limits Thoracic Assessment Thoracic Assessment: Within Functional Limits Lumbar Assessment Lumbar Assessment: Exceptions to [facility] (back precautions) Postural Control Postural Control: Within Functional Limits  Balance Balance Balance Assessed: No Standardized Balance Assessment Standardized Balance Assessment: Berg Balance Test Berg Balance Test Sit to Stand: Able to stand without using hands and stabilize independently Standing Unsupported: Able to stand safely 2 minutes Sitting with Back Unsupported but Feet Supported on Floor or Stool: Able to sit safely and securely 2 minutes Stand to Sit: Sits safely with minimal use of hands Transfers: Able to transfer safely, minor use of  hands Standing Unsupported with Eyes Closed: Able to stand 10 seconds safely Standing Ubsupported with Feet Together: Able to place feet together independently and stand 1 minute safely From Standing, Reach Forward with Outstretched Arm: Can reach confidently >25 cm (10") From Standing Position, Pick up Object from Floor: Able to pick up shoe safely and easily From Standing Position, Turn to Look Behind Over each Shoulder: Looks behind from both sides and weight shifts well Turn 360 Degrees: Able to  turn 360 degrees safely in 4 seconds or less Standing Unsupported, Alternately Place Feet on Step/Stool: Able to stand independently and safely and complete 8 steps in 20 seconds Standing Unsupported, One Foot in Front: Able to place foot tandem independently and hold 30 seconds Standing on One Leg: Able to lift leg independently and hold > 10 seconds Total Score: 56 Static Sitting Balance Static Sitting - Balance Support: No upper extremity supported Static Sitting - Level of Assistance: 7: Independent Dynamic Sitting Balance Dynamic Sitting - Balance Support: No upper extremity supported;Feet supported;During functional activity Dynamic Sitting - Level of Assistance: 7: Independent Static Standing Balance Static Standing - Balance Support: No upper extremity supported;During functional activity Static Standing - Level of Assistance: 7: Independent Dynamic Standing Balance Dynamic Standing - Balance Support: No upper extremity supported;During functional activity Dynamic Standing - Level of Assistance: 7: Independent Extremity Assessment   RLE Assessment RLE Assessment: Within Functional Limits RLE Strength Right Hip Flexion: 5/5 Right Knee Flexion: 5/5 Right Knee Extension: 5/5 Right Ankle Dorsiflexion: 5/5 Right Ankle Plantar Flexion: 5/5 LLE Assessment LLE Assessment: Within Functional Limits LLE Strength Left Hip Flexion: 4/5 Left Knee Flexion: 5/5 Left Knee Extension: 5/5 Left  Ankle Dorsiflexion: 5/5 Left Ankle Plantar Flexion: 5/5     Excell Seltzer, PT, DPT, CSRS 04/05/2021, 12:01 PM

## 2021-04-05 NOTE — Progress Notes (Signed)
Occupational Therapy Session Note  Patient Details  Name: Shannon [name] MRN: [id number] Date of Birth: 1981-06-03  Today's Date: 04/05/2021 OT Individual Time: 1335-1455 OT Individual Time Calculation (min): 80 min    Short Term Goals: Week 2:  OT Short Term Goal 1 (Week 2): STG=LTG 2/2 ELOS  Skilled Therapeutic Interventions/Progress Updates:     Pt received in bed with 2 out of 10 pain in back. Rest provided as needed  Pt reporting wanting to walk during session. Pt completes scavenger hunt throughout hospital to navigate community level distances and spaces. Pt able to navigate all spaces and starewells with MOD I. Pt educated on various places to take rest breaks/types of rest breaks and trials high top, booth and regular restaurant style tables. Pt purchases items at gift shop with MOD I, demoing good BLT strategies for lower items.   Pt completes seated and standing drum fit activity to improve balance, activity tolerance, arm strengthening. Pt moves through lateral stepping, alternating marching, staggered stance and laterals stepping while coordinating arm movements to the beat of preferred music. Pt laughing and enjoying "feeling like myself again" with fitness.  Pt left at end of session in bed with exit alarm on, call light in reach and all needs met   Therapy Documentation Precautions:  Precautions Precautions: Back, Fall Restrictions Weight Bearing Restrictions: No General:   Vital Signs: Therapy Vitals Temp: 97.9 F (36.6 C) Pulse Rate: 89 Resp: 19 BP: 119/87 Patient Position (if appropriate): Lying Oxygen Therapy SpO2: 100 % O2 Device: Room Air Pain:   ADL: ADL Eating: Independent Grooming: Setup Upper Body Bathing: Supervision/safety Where Assessed-Upper Body Bathing: Shower Lower Body Bathing: Moderate assistance Where Assessed-Lower Body Bathing: Shower Upper Body Dressing: Supervision/safety Lower Body Dressing: Moderate assistance Toileting:  Minimal assistance Where Assessed-Toileting: Glass blower/designer: Psychiatric nurse Method: Counselling psychologist: Raised Counselling psychologist: Moderate assistance Social research officer, government Method: Heritage manager: Radio broadcast assistant, Systems analyst    Praxis   Exercises:   Other Treatments:     Therapy/Group: Individual Therapy  Tonny Branch 04/05/2021, 6:58 AM

## 2021-04-05 NOTE — Progress Notes (Signed)
Occupational Therapy Session Note  Patient Details  Name: Shannon [name] MRN: [id number] Date of Birth: 1981-02-23  Today's Date: 04/05/2021 OT Individual Time: 9417-9199 OT Individual Time Calculation (min): 56 min    Short Term Goals: Week 1:  OT Short Term Goal 1 (Week 1): Pt will don LB clothing with AE with S. OT Short Term Goal 1 - Progress (Week 1): Met OT Short Term Goal 2 (Week 1): Pt will be able to toilet with S. OT Short Term Goal 2 - Progress (Week 1): Met OT Short Term Goal 3 (Week 1): Pt will be able to tolerate standing at the sink to brush her teeth. OT Short Term Goal 3 - Progress (Week 1): Met Week 2:  OT Short Term Goal 1 (Week 2): STG=LTG 2/2 ELOS    Skilled Therapeutic Interventions/Progress Updates:    Pt greeted at time of session resting in bed no pain, but did have some low back discomfort later with mobility and K tape applied with resolution of symptoms. Politely declined Adl today, is aware of grad day and excited. Bed mobility Indep, sitting EOB therapist applied K tape to low back and pt stating this improved discomfort. Ambulated room <> gym Indep and performed rebounder 3 trials: 1x30 throws w/ 2kg ball, 2x20 hits in single leg stance switching legs in between sets. Seated EOM, pt performed 2x5 FWD rolls with hip flexion, ensuring to stay within back precautions with passive stretch to low back stating this "felt good." Supine low back stretch for knees > chest within back precautions with also relief. Obstacle course for collecting cones from various heights including ground level with good squat form 5/5 cones before walking back to room Indep. All needs met call bell in reach.     Therapy Documentation Precautions:  Precautions Precautions: Back, Fall Restrictions Weight Bearing Restrictions: No      Therapy/Group: Individual Therapy  [name] 04/05/2021, 7:13 AM

## 2021-04-05 NOTE — Progress Notes (Signed)
Recreational Therapy Discharge Summary Patient Details  Name: Shannon [name] MRN: [id number] Date of Birth: 1981/09/14 Today's Date: 04/05/2021  Long term goals set: 1  Long term goals met: 1  Comments on progress toward goals: Pt has excellent progress during LOS and is ready for discharge home 6/16.  TR sessions focused on activity analysis including potential modifications, coping strategies & community reintegration.  Pt met supervision ambulatory level without the use of AD.  Education provided on energy conservation, identifiction and negotiation of obstacles, accessing public restroom and navigating community pursuits with her young children.  Pt stated feeling prepared for discharge.  Goal met.   Reasons for discharge: discharge from hospital   Patient/family agrees with progress made and goals achieved: Yes  Shannon [name] 04/05/2021, 9:05 AM

## 2021-04-05 NOTE — Discharge Summary (Signed)
Physician Discharge Summary  Patient ID: Shannon [name] MRN: [id number] DOB/AGE: Dec 30, 1980 40 y.o.  Admit date: 03/24/2021 Discharge date: 04/06/2021  Discharge Diagnoses:  Principal Problem:   Lumbar radiculitis Active Problems:   Postoperative pain   High blood pressure   Tachycardia   Discharged Condition: good  Significant Diagnostic Studies: DG Lumbar Spine 2-3 Views  Result Date: 03/17/2021 CLINICAL DATA:  Intraop localization for laminectomy at L4-L5. EXAM: LUMBAR SPINE - 2-3 VIEW COMPARISON:  None. FINDINGS: Two portable cross-table lateral views of the lumbar spine obtained in the operating room. Image labeled 1 demonstrates surgical instrument in the soft tissues posterior to L4. Image labeled 2 demonstrates surgical instruments localizing posterior to L4. IMPRESSION: Surgical instruments localizing posterior to L4. Electronically Signed   By: Keith Rake M.D.   On: 03/17/2021 15:06    Labs:  Basic Metabolic Panel: BMP Latest Ref Rng & Units 04/03/2021 03/30/2021 03/27/2021  Glucose 70 - 99 mg/dL 90 94 93  BUN 6 - 20 mg/dL 14 13 11   Creatinine 0.44 - 1.00 mg/dL 1.05(H) 0.97 0.92  BUN/Creat Ratio 9 - 23 - - -  Sodium 135 - 145 mmol/L 138 138 140  Potassium 3.5 - 5.1 mmol/L 4.1 4.0 3.7  Chloride 98 - 111 mmol/L 103 100 102  CO2 22 - 32 mmol/L 30 30 31   Calcium 8.9 - 10.3 mg/dL 9.0 9.2 9.4     CBC: CBC Latest Ref Rng & Units 04/03/2021 03/30/2021 03/28/2021  WBC 4.0 - 10.5 K/uL 15.1(H) 25.7(H) 25.7(H)  Hemoglobin 12.0 - 15.0 g/dL 11.6(L) 11.6(L) 11.9(L)  Hematocrit 36.0 - 46.0 % 37.0 36.7 38.5  Platelets 150 - 400 K/uL 377 375 434(H)     Vitals: Vitals with BMI 04/06/2021 04/05/2021 04/05/2021  Height - - -  Weight - - -  BMI - - -  Systolic 381 829 937  Diastolic 92 95 97  Pulse 96 98 108    CBG: No results for input(s): GLUCAP in the last 168 hours.  Brief HPI:   Shannon [name] is a 40 y.o. female with history of ADD, preeclampsia, depression, back pain with  numbness and tingling BLE/feet with involuntary spasms, falls and bladder incontinence over past 2 months.  Work-up done revealing homogeneous intradural mass at L4/L5 and she was admitted on 03/17/2021 for decompression L4 and L5 laminectomy and tumor excision by Dr. Saintclair Halsted.  [facility] course was complicated by lower extremity spasms with tachycardias as well as dysesthesias.  She had limitations in mobility due to pain as well as total body spasms and Medrol Dosepak was started to decrease inflammation.  She continued to be limited by RLE weakness with sensory deficits, had difficulty with mobility as well as completing ADL tasks.  CIR was recommended due to functional decline.   Hospital Course: Shannon [name] was admitted to rehab 03/24/2021 for inpatient therapies to consist of PT and OT at least three hours five days a week. Past admission physiatrist, therapy team and rehab RN have worked together to provide customized collaborative inpatient rehab. Her blood pressures were monitored on TID basis and noted to be elevated. She continued to have  resting tachycardia with HR up to 110s. Therefore, Coreg was added with improvement in BP and heart rate. Prednisone was slowly weaned off by 06/15. She was found to have upward rise in WBC to 26.8 with bandemia. Her back incision was healing well and no pulmonary symptoms noted. Her UA was negative for nitrites and culture showed multispecies.   She  was reporting bladder spasms and was treated with a dose of monurol. Dr. Mickeal Skinner was contacted for input and felt that rise in WBC likely due to steroids as no source of infection noted and will repeat CBC on outpatient basis. Serial check of CBC shows WBC to be trending down to 15.1.  Pain control has improved with addition of flexeril at bedtime with prn baclofen being used 1-2 times a day and gabapentin tid. She is using ibuprofen 800 mg prn for pain as needed and tolerating this without SE. She has made steady progress  and is currently modified independent in supervised setting. She will continue to receive outpatient PT at [facility] after discharge.    Rehab course: During patient's stay in rehab weekly team conferences were held to monitor patient's progress, set goals and discuss barriers to discharge. At admission, patient required mod assist with basic self care tasks and min to mod assist with mobility.  She  has had improvement in activity tolerance, balance, postural control as well as ability to compensate for deficits. She is able to complete ADL tasks at modified independent level. She is independent for transfers and to ambulate 1000' without AD. Family education was completed.     Disposition: Home  Diet: Regular  Special Instructions: No driving  or strenuous activity till cleared by MD. Continue back precautions.  Will need CBC repeated in  a week to monitor WBC.  Discharge Instructions     Ambulatory referral to Physical Medicine Rehab   Complete by: As directed    Office will call you with follow up appt      Allergies as of 04/06/2021       Reactions   Diclofenac Rash   Pineapple Shortness Of Breath   Diflucan [fluconazole] Hives   Raw rash on abdomen and she had sores and swelling in mouth    Bactrim [sulfamethoxazole-trimethoprim] Hives   Ceftriaxone Sodium Rash   No SOB - patient willing to try again   Latex Rash        Medication List     STOP taking these medications    methocarbamol 500 MG tablet Commonly known as: Robaxin   ondansetron 8 MG disintegrating tablet Commonly known as: Zofran ODT   OVER THE COUNTER MEDICATION   OVER THE COUNTER MEDICATION   oxyCODONE-acetaminophen 5-325 MG tablet Commonly known as: Percocet   predniSONE 20 MG tablet Commonly known as: DELTASONE       TAKE these medications    acetaminophen 325 MG tablet Commonly known as: TYLENOL Take 1-2 tablets (325-650 mg total) by mouth every 4 (four) hours as needed for  mild pain.   albuterol 108 (90 Base) MCG/ACT inhaler Commonly known as: VENTOLIN HFA Inhale 2 puffs into the lungs every 6 (six) hours as needed for wheezing or shortness of breath.   Baclofen 5 MG Tabs Take 1 tablet by mouth 3 (three) times daily as needed for muscle spasms.   carvedilol 6.25 MG tablet Commonly known as: COREG Take 1 tablet (6.25 mg total) by mouth 2 (two) times daily with a meal.   cyclobenzaprine 10 MG tablet Commonly known as: FLEXERIL Take 1 tablet (10 mg total) by mouth at bedtime. As needed for back pain   DULoxetine 60 MG capsule Commonly known as: CYMBALTA Take 1 capsule by mouth daily.   gabapentin 300 MG capsule Commonly known as: NEURONTIN Take 1 capsule (300 mg total) by mouth 3 (three) times daily.   ibuprofen 800 MG tablet  Commonly known as: ADVIL Take 1 tablet (800 mg total) by mouth every 8 (eight) hours as needed for mild pain. What changed:  medication strength when to take this reasons to take this Another medication with the same name was removed. Continue taking this medication, and follow the directions you see here.   melatonin 5 MG Tabs Take 2 tablets (10 mg total) by mouth at bedtime.   pantoprazole 40 MG tablet Commonly known as: PROTONIX Take 1 tablet (40 mg total) by mouth at bedtime.   Vyvanse 70 MG capsule Generic drug: lisdexamfetamine Take 70 mg by mouth every morning.        Follow-up Information     Lovorn, Jinny Blossom, MD Follow up.   Specialty: Physical Medicine and Rehabilitation Why: Office will call you with follow up appointment Contact information: [address] [place] [address] [phone]         Kary Kos, MD Follow up.   Specialty: Neurosurgery Contact information: [address] [place] [address] [phone]         Glendale Chard, MD. Call.   Specialty: Internal Medicine Why: for post hospital follow up Contact information: [address] [place] [address] [phone]         Ventura Sellers, MD Follow up on 04/13/2021.   Specialties: Psychiatry, Neurology, Oncology Contact information: [place] [address] [phone]                 Signed: Bary Leriche 04/06/2021, 5:38 PM

## 2021-04-05 NOTE — Progress Notes (Signed)
PROGRESS NOTE   Subjective/Complaints:   Pt has no complaints-  Asking about heart rate and BP- doing much better this AM.   ROS:   Pt denies SOB, abd pain, CP, N/V/C/D, and vision changes  Objective:   No results found. Recent Labs    04/03/21 0642  WBC 15.1*  HGB 11.6*  HCT 37.0  PLT 377    Recent Labs    04/03/21 0642  NA 138  K 4.1  CL 103  CO2 30  GLUCOSE 90  BUN 14  CREATININE 1.05*  CALCIUM 9.0     Intake/Output Summary (Last 24 hours) at 04/05/2021 0823 Last data filed at 04/05/2021 0713 Gross per 24 hour  Intake 1080 ml  Output --  Net 1080 ml        Physical Exam: Vital Signs Blood pressure 119/87, pulse 89, temperature 97.9 F (36.6 C), resp. rate 19, height 5\' 2"  (1.575 m), weight 120.7 kg, SpO2 100 %, unknown if currently breastfeeding.    General: awake, alert, appropriate, sitting up in bed; hair different; NAD HENT: conjugate gaze; oropharynx moist CV: regular rate- rate in 80s; ; no JVD Pulmonary: CTA B/L; no W/R/R- good air movement GI: soft, NT, ND, (+)BS Psychiatric: appropriate- interactive Neurological: Ox3; no spasticity Musculoskeletal:        General: No swelling or tenderness.     Cervical back: Normal range of motion and neck supple.     Comments: No edema or tenderness in extremities  Skin:    General: Skin is warm and dry.     Comments: back incision almost completely scarred now.  Neurological:     Mental Status: She is alert.     Comments: Alert and oriented Motor: Bilateral upper extremities: 5/5 proximal distal Bilateral lower extremities: Flexion, knee extension 4 -/5, ankle dorsiflexion 5/5 Sensation intact light touch     No results found for this or any previous visit (from the past 48 hour(s)). No results found.   Assessment/Plan: 1. Functional deficits which require 3+ hours per day of interdisciplinary therapy in a comprehensive inpatient  rehab setting. Physiatrist is providing close team supervision and 24 hour management of active medical problems listed below. Physiatrist and rehab team continue to assess barriers to discharge/monitor patient progress toward functional and medical goals  Care Tool:  Bathing    Body parts bathed by patient: Right arm, Left arm, Chest, Abdomen, Front perineal area, Right upper leg, Left upper leg, Face, Buttocks, Right lower leg, Left lower leg         Bathing assist Assist Level: Set up assist     Upper Body Dressing/Undressing Upper body dressing   What is the patient wearing?: Pull over shirt    Upper body assist Assist Level: Set up assist    Lower Body Dressing/Undressing Lower body dressing      What is the patient wearing?: Pants, Underwear/pull up     Lower body assist Assist for lower body dressing: Set up assist     Toileting Toileting    Toileting assist Assist for toileting: Minimal Assistance - Patient > 75%     Transfers Chair/bed transfer  Transfers assist  Chair/bed  transfer activity did not occur: Safety/medical concerns (required use of RW)  Chair/bed transfer assist level: Independent with assistive device Chair/bed transfer assistive device: Other (increased time)   Locomotion Ambulation   Ambulation assist   Ambulation activity did not occur: Safety/medical concerns (requires use of RW)  Assist level: Supervision/Verbal cueing Assistive device: Other (comment) (shopping cart) Max distance: 1000 ft   Walk 10 feet activity   Assist  Walk 10 feet activity did not occur: Safety/medical concerns  Assist level: Supervision/Verbal cueing Assistive device: Other (comment) (shopping cart)   Walk 50 feet activity   Assist Walk 50 feet with 2 turns activity did not occur: Safety/medical concerns  Assist level: Supervision/Verbal cueing Assistive device: Other (comment) (shopping cart)    Walk 150 feet activity   Assist Walk 150  feet activity did not occur: Safety/medical concerns  Assist level: Supervision/Verbal cueing (shopping cart) Assistive device: Other (comment) (shopping cart)    Walk 10 feet on uneven surface  activity   Assist Walk 10 feet on uneven surfaces activity did not occur: Safety/medical concerns   Assist level: Supervision/Verbal cueing Assistive device:  (no device)   Wheelchair     Assist Will patient use wheelchair at discharge?: No             Wheelchair 50 feet with 2 turns activity    Assist            Wheelchair 150 feet activity     Assist          Blood pressure 119/87, pulse 89, temperature 97.9 F (36.6 C), resp. rate 19, height 5\' 2"  (1.575 m), weight 120.7 kg, SpO2 100 %, unknown if currently breastfeeding.    Medical Problem List and Plan: 1.  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               pt may shower             -ELOS/Goals: 10-14 days/supervision/mod I           con't PT and OT- pt said cannot arrange to leave on 6/14- but can as we discussed on 6/16.   -con't PT and OT- needs family training before d/c- con't Ktape for back pain.   -con't CIR- con't PT and OT- d/c tomorrow- finish family training.  2.  Antithrombotics: -DVT/anticoagulation:  Pharmaceutical: Heparin 6/7- changed to lovenox- since 1x/day- d/w pt  6/15- will not send home on Lovenox- had lumbar compression- not true SCI and walking >247ft without AD-              -antiplatelet therapy: N/A 3. Pain Management: Continue Oxycodone prn             --On Gabapentin tid.              --will add baclofen tid prn. Continue flexeril at bedtime.   6/6- will add Flexeril 5 mg TID prn and change flexeril to 10 mg QHS- and monitor  6/7- pt actually liked Baclofen better than Flexeril- switched those meds and stopped Flexeril during day and gave Baclofen 5 mg TID prn.  6/14- using Ktape for back pain- no pain meds-  just baclofen prn  6/15- pain controlled- con't regimen             Monitor with increased exertion 4. Mood: LCSW to follow for evaluation and support             -antipsychotic agents:  N/AA 5. Neuropsych: This patient is capable of making decisions on her own behalf. 6. Skin/Wound Care: Monitor incision for healing.             -- Routine pressure-relief measures. 7. Fluids/Electrolytes/Nutrition: Monitor intake/output.             CMP stable, monitor weekly.  8.  Elevated blood pressure:              --Better controlled, continue to Monitor 3 times daily.  Hydralazine prn for 24 hours.   6/9- BP a little elevated- but usually controlled- con't regimen  6/0- BP 130s/100 this AM- doesn't seem to be stable- con't to monitor and will change meds if see a trend.   6/11- BP 140s/100s- has hydralazine prn- will start Norvasc 2.5 mg daily for BP.   6/14 changed to Coreg- 6.25 mg BID- still elevated- will monitor  6/15- BP MUCH better- and pulse in 80s- con't regimen- don't think will need change before d/c.              Monitor with increased exertion 9. Neurogenic bladder: Incontinence has resolved but now with urgency/feeling of not emptying fully             PVRs ordered  6/6- PVRs are OK- but has also c/o bladder spasms-and WBC is elevated even more- will check U/A and Cx   6/9- tx'd with fosfomycin.  10. Anxiety/ADD/ADHD/Depression: has been under lot of stress.              --Continue Vyvanse and Cymbalta  6/8- change time to give together             Team support 11. Leukocytosis: likely 2/2 steroids, afebrile, repeat Monday.  6/6- up to 26k- UTI?- checking today! Will recheck labs in AM  6/7- Pt has UTI- also up due to steroids, but is stable /slightly better- treating with Fosfomycin-   6/8- Cx is multiple species, but pt feeling much better- will check CBC tomorrow, to make sure it's coming down and follow clinically  6/9- WBC still 25k- however no left shift- Neutrophils are 55-  not elevated.   6/15- WBC down to 15k- will con't to monitor 12. Ependymoma: pathology results discussed with patient. Patient would like to learn more about this tumor and discuss treatment plan with oncology as soon as possible.  6/7- spoke with Oncology and they will get back to me about plan.   6/9- they plan on seeing her after d/c.   6/11- PA and I concerned about WBC and basophilia- since was nml when admitted, Onc said will just see outpt.  13. UTI  6/7- Culture pending- since allergic to Cephalosporins -hives, and Bactrim- hives, called pharmacy and discussed- agreed on Fosfomycin- 1x and will wait for urine Cx.   6/8- s/p fosfomycin- Cx shows multiple species- as above.   WBC improved from 25K to 15K on 6/13 cont to monitor  14. Constipation  6/8- had a good BM today- abd tightness resolved- will con't bowel regimen and monitor  6/9- doing better- con't regimen  6/11- resolved- off oxycodone- con't regimen 15. Tachycardia/Elevated BP- EKG showed sinus tach on 5/24 otherwise normal   6/12- will d/c Norvasc that was started yesterday and add Coreg 3.125 mg BID for both issues and monitor- might need increase dose.   6/14- BP doing ale better- was 123/83, but back up right before BP meds to 142/95- pulse 94 to 109- Coreg just increased- so will  con't for now.   6/15- pulse running in 80s with increase in Coreg- BP also bette-r con't regimen - will need PCP to determine if continue Vyvanse?  Vitals:   04/05/21 0005 04/05/21 0443  BP: 100/60 119/87  Pulse: 90 89  Resp: 18 19  Temp: 98.2 F (36.8 C) 97.9 F (36.6 C)  SpO2: 95% 100%   LOS: 12 days A FACE TO FACE EVALUATION WAS PERFORMED  Sanye Ledesma 04/05/2021, 8:23 AM

## 2021-04-05 NOTE — Progress Notes (Signed)
Physical Therapy Session Note  Patient Details  Name: Shannon [name] MRN: [id number] Date of Birth: [birth year]  Today's Date: 04/05/2021 PT Individual Time: 1188-6773 PT Individual Time Calculation (min): 60 min   Short Term Goals: Week 2:  PT Short Term Goal 1 (Week 2): =LTG due to ELOS  Skilled Therapeutic Interventions/Progress Updates:    Pt received seated EOB in room, agreeable to PT session. No complaints of pain this date. Pt performs all transfers independently this date. Ambulation up to 1000 ft no AD independently. Car transfer independent. Ambulation up/down ramp, across uneven surface, and up/down curb independently. Ascend/descend 36 x 6" stairs with R handrail at mod I level. Patient demonstrates decreased fall risk as noted by score of 56/56 on Berg Balance Scale.  (<36= high risk for falls, close to 100%; 37-45 significant >80%; 46-51 moderate >50%; 52-55 lower >25%). Sit to/from supine on real bed in therapy apartment independently. Sit to stand from low, pliable surface of couch independently. Heart rate 105 at beginning of therapy session, increases to 125 with activity, drops to 115 with seated rest break. Pt is safe to be independent in her room, nursing and team updated. Pt left seated EOB in room with needs in reach.  Therapy Documentation Precautions:  Precautions Precautions: Back, Fall Precaution Comments: pt able to recall 3/3 back precautions Restrictions Weight Bearing Restrictions: No    Therapy/Group: Individual Therapy   Excell Seltzer, PT, DPT, CSRS  04/05/2021, 12:01 PM

## 2021-04-06 ENCOUNTER — Other Ambulatory Visit (HOSPITAL_COMMUNITY): Payer: Self-pay

## 2021-04-06 ENCOUNTER — Telehealth: Payer: Self-pay

## 2021-04-06 NOTE — Plan of Care (Signed)
  Problem: Consults Goal: RH SPINAL CORD INJURY PATIENT EDUCATION Description:  See Patient Education module for education specifics.  Outcome: Completed/Met Goal: Skin Care Protocol Initiated - if Braden Score 18 or less Description: If consults are not indicated, leave blank or document N/A Outcome: Completed/Met   Problem: RH SKIN INTEGRITY Goal: RH STG ABLE TO PERFORM INCISION/WOUND CARE W/ASSISTANCE Description: STG Able To Perform Incision/Wound Care With Supervision Assistance. Outcome: Completed/Met   Problem: RH SAFETY Goal: RH STG ADHERE TO SAFETY PRECAUTIONS W/ASSISTANCE/DEVICE Description: STG Adhere to Safety Precautions With Cues and Reminders. Outcome: Completed/Met   Problem: RH PAIN MANAGEMENT Goal: RH STG PAIN MANAGED AT OR BELOW PT'S PAIN GOAL Description: < 4 on a 0-10 pain scale. Outcome: Completed/Met   Problem: RH KNOWLEDGE DEFICIT SCI Goal: RH STG INCREASE KNOWLEDGE OF SELF CARE AFTER SCI Description: Patient will be able to demonstrate knowledge of medication management, pain management, skin/wound care with educational materials and handouts provided by staff independently at discharge. Outcome: Completed/Met

## 2021-04-06 NOTE — Progress Notes (Signed)
Patient discharged off of unit with all belongings. Discharge papers/instructions explained by physician assistant to patient. Patient has no further questions at time of discharge. No complications noted at this time.  [place]

## 2021-04-06 NOTE — Telephone Encounter (Signed)
Baclofen approved 04/05/2021-04/05/2022.

## 2021-04-07 ENCOUNTER — Encounter: Payer: Self-pay | Admitting: Physical Medicine and Rehabilitation

## 2021-04-10 ENCOUNTER — Telehealth: Payer: Self-pay

## 2021-04-10 NOTE — Telephone Encounter (Signed)
Transitional Care call--patient    Are you/is patient experiencing any problems since coming home? No Are there any questions regarding any aspect of care? No Are there any questions regarding medications administration/dosing? No Are meds being taken as prescribed? Yes Patient should review meds with caller to confirm Have there been any falls? No Has Home Health been to the house and/or have they contacted you? Yes If not, have you tried to contact them? Can we help you contact them? Are bowels and bladder emptying properly? Yes Are there any unexpected incontinence issues? No If applicable, is patient following bowel/bladder programs? Any fevers, problems with breathing, unexpected pain? No Are there any skin problems or new areas of breakdown? No Has the patient/family member arranged specialty MD follow up (ie cardiology/neurology/renal/surgical/etc)? Yes Can we help arrange? Does the patient need any other services or support that we can help arrange? No Are caregivers following through as expected in assisting the patient? Yes Has the patient quit smoking, drinking alcohol, or using drugs as recommended? Yes  Appointment time 1:00 pm, arrive time 12:40 pm with Dr. Dagoberto Ligas on 04/14/21 [place]

## 2021-04-13 ENCOUNTER — Other Ambulatory Visit: Payer: Self-pay

## 2021-04-13 ENCOUNTER — Inpatient Hospital Stay: Payer: BC Managed Care – PPO | Attending: Internal Medicine | Admitting: Internal Medicine

## 2021-04-13 ENCOUNTER — Encounter: Payer: Self-pay | Admitting: Internal Medicine

## 2021-04-13 DIAGNOSIS — D447 Neoplasm of uncertain behavior of aortic body and other paraganglia: Secondary | ICD-10-CM

## 2021-04-13 NOTE — Progress Notes (Signed)
[facility] [place], [place] [address] [phone]   New Patient Evaluation  Date of Service: 04/13/21 Patient Name: Shannon [name] Patient MRN: [id number] Patient DOB: [birth year] Provider: Ventura Sellers, MD  Identifying Statement:  Shannon [name] is a 40 y.o. female with  cauda equina   paraganglioma  who presents for initial consultation and evaluation.    Referring Provider: Glendale Chard, MD [address] [place],  [place] [address]  Oncologic History: 03/17/21: Laminectomy, resection by Dr. Saintclair Halsted.  Path demonstrates paraganglioma  History of Present Illness: The patient's records from the referring physician were obtained and reviewed and the patient interviewed to confirm this HPI.  Shannon [name] presented to medical attention in May 2022 with several months history of severe, refractory lower back pain.  Initially treated for sciatica, she eventually obtained an MRI which demonstrated a tumor within the roots of the cauda equina.  She also has complained of headaches, excess sweating, palpitations at times.  She underwent laminectomy and resection of the lesion with Dr. Saintclair Halsted on 5/27, path demonstrated paraganglioma.  Since then her pain is significantly improved.  There was some weakness after surgery but it improved with short rehab stint.  Over past month, no new or progressive deficits.  Medications: Current Outpatient Medications on File Prior to Visit  Medication Sig Dispense Refill   acetaminophen (TYLENOL) 325 MG tablet Take 1-2 tablets (325-650 mg total) by mouth every 4 (four) hours as needed for mild pain.     albuterol (PROVENTIL HFA;VENTOLIN HFA) 108 (90 Base) MCG/ACT inhaler Inhale 2 puffs into the lungs every 6 (six) hours as needed for wheezing or shortness of breath. 1 Inhaler 3   Baclofen 5 MG TABS Take 1 tablet by mouth 3 (three) times daily as needed for muscle spasms. 60 tablet 0   carvedilol  (COREG) 6.25 MG tablet Take 1 tablet (6.25 mg total) by mouth 2 (two) times daily with a meal. 60 tablet 0   cyclobenzaprine (FLEXERIL) 10 MG tablet Take 1 tablet (10 mg total) by mouth at bedtime. As needed for back pain 30 tablet 0   DULoxetine (CYMBALTA) 60 MG capsule Take 1 capsule by mouth daily.   1   gabapentin (NEURONTIN) 300 MG capsule Take 1 capsule (300 mg total) by mouth 3 (three) times daily. 90 capsule 0   ibuprofen (ADVIL) 800 MG tablet Take 1 tablet (800 mg total) by mouth every 8 (eight) hours as needed for mild pain. 15 tablet 0   melatonin 5 MG TABS Take 2 tablets (10 mg total) by mouth at bedtime. 60 tablet 0   pantoprazole (PROTONIX) 40 MG tablet Take 1 tablet (40 mg total) by mouth at bedtime. 30 tablet 0   VYVANSE 70 MG capsule Take 70 mg by mouth every morning.     No current facility-administered medications on file prior to visit.    Allergies:  Allergies  Allergen Reactions   Diclofenac Rash   Pineapple Shortness Of Breath   Diflucan [Fluconazole] Hives    Raw rash on abdomen and she had sores and swelling in mouth    Bactrim [Sulfamethoxazole-Trimethoprim] Hives   Ceftriaxone Sodium Rash    No SOB - patient willing to try again   Latex Rash   Past Medical History:  Past Medical History:  Diagnosis Date   ADD (attention deficit disorder)    Anemia    Anxiety    Asthma    Back  pain    Bilateral swelling of feet    Chest pain    Constipation    Depression    Hyperlipidemia    Hypertension    Preeclampsia post delivery   Irregular heart beat    Joint pain    Multiple food allergies    Obesity    Palpitations in pediatric patient    PONV (postoperative nausea and vomiting)    PONV; Also emotional upon waking up   Shortness of breath    Vitamin D deficiency    Past Surgical History:  Past Surgical History:  Procedure Laterality Date   DILATATION & CURRETTAGE/HYSTEROSCOPY WITH RESECTOCOPE N/A 02/03/2014   Procedure: DILATATION &  CURETTAGE/HYSTEROSCOPY ;  Surgeon: Delice Lesch, MD;  Location: [facility];  Service: Gynecology;  Laterality: N/A;   DILATION AND CURETTAGE OF UTERUS     LAMINECTOMY N/A 03/17/2021   Procedure: Laminectomy - Lumbar Four-Lumbar Five for Intradural mass;  Surgeon: Kary Kos, MD;  Location: [facility];  Service: Neurosurgery;  Laterality: N/A;  3C   MYOMECTOMY N/A 02/03/2014   Procedure: MYOMECTOMY ;  Surgeon: Delice Lesch, MD;  Location: [facility];  Service: Gynecology;  Laterality: N/A;   SALPINGECTOMY     TONSILLECTOMY     tonsils and adnoids removed     TUBAL LIGATION N/A 08/19/2015   Procedure: POST PARTUM TUBAL LIGATION;  Surgeon: Everett Graff, MD;  Location: [facility];  Service: Gynecology;  Laterality: N/A;   WISDOM TOOTH EXTRACTION     Social History:  Social History   Socioeconomic History   Marital status: Married    Spouse name: Not on file   Number of children: Not on file   Years of education: Not on file   Highest education level: Not on file  Occupational History   Not on file  Tobacco Use   Smoking status: Never   Smokeless tobacco: Never  Vaping Use   Vaping Use: Never used  Substance and Sexual Activity   Alcohol use: No    Comment: occasionally   Drug use: No   Sexual activity: Yes    Birth control/protection: None  Other Topics Concern   Not on file  Social History Narrative   Not on file   Social Determinants of Health   Financial Resource Strain: Not on file  Food Insecurity: Not on file  Transportation Needs: Not on file  Physical Activity: Not on file  Stress: Not on file  Social Connections: Not on file  Intimate Partner Violence: Not on file   Family History:  Family History  Problem Relation Age of Onset   Hypertension Mother    Diabetes Mother    Mental illness Mother    Hyperlipidemia Mother    Stroke Mother    Depression Mother    Anxiety disorder Mother    Bipolar disorder Mother    Liver disease Mother    Sleep apnea Mother     Obesity Mother    Hypertension Father    Prostate cancer Father    Asthma Brother    Heart disease Maternal Grandmother    Hypertension Maternal Grandmother    Cancer Paternal Grandmother        breast cancer   Seizures Brother     Review of Systems: Constitutional: Doesn't report fevers, chills or abnormal weight loss Eyes: Doesn't report blurriness of vision Ears, nose, mouth, throat, and face: Doesn't report sore throat Respiratory: Doesn't report cough, dyspnea or wheezes Cardiovascular: Doesn't report palpitation, chest  discomfort  Gastrointestinal:  Doesn't report nausea, constipation, diarrhea GU: Doesn't report incontinence Skin: Doesn't report skin rashes Neurological: Per HPI Musculoskeletal: Doesn't report joint pain Behavioral/Psych: Doesn't report anxiety  Physical Exam: Vitals:   04/13/21 1001 04/13/21 1005  BP:  (!) 139/110  Pulse: (!) 111 (!) 111  Resp: 20 20  Temp: 98.2 F (36.8 C) 98.2 F (36.8 C)  SpO2: 100% 100%   KPS: 80. General: Alert, cooperative, pleasant, in no acute distress Head: Normal EENT: No conjunctival injection or scleral icterus.  Lungs: Resp effort normal Cardiac: Regular rate Abdomen: Non-distended abdomen Skin: No rashes cyanosis or petechiae. Extremities: No clubbing or edema  Neurologic Exam: Mental Status: Awake, alert, attentive to examiner. Oriented to self and environment. Language is fluent with intact comprehension.  Cranial Nerves: Visual acuity is grossly normal. Visual fields are full. Extra-ocular movements intact. No ptosis. Face is symmetric Motor: Tone and bulk are normal. Power is full in both arms and legs. Reflexes are diminished, no pathologic reflexes present.  Sensory: Intact to light touch Gait: Normal.   Labs: I have reviewed the data as listed    Component Value Date/Time   NA 138 04/03/2021 0642   NA 140 08/08/2020 1653   K 4.1 04/03/2021 0642   CL 103 04/03/2021 0642   CO2 30 04/03/2021 0642    GLUCOSE 90 04/03/2021 0642   BUN 14 04/03/2021 0642   BUN 9 08/08/2020 1653   CREATININE 1.05 (H) 04/03/2021 0642   CREATININE 0.77 11/20/2012 1535   CALCIUM 9.0 04/03/2021 0642   PROT 5.8 (L) 03/25/2021 0505   PROT 6.9 08/08/2020 1653   ALBUMIN 2.7 (L) 03/25/2021 0505   ALBUMIN 4.2 08/08/2020 1653   AST 14 (L) 03/25/2021 0505   ALT 23 03/25/2021 0505   ALKPHOS 85 03/25/2021 0505   BILITOT <0.1 (L) 03/25/2021 0505   BILITOT 0.2 08/08/2020 1653   GFRNONAA >60 04/03/2021 0642   GFRAA 92 08/08/2020 1653   Lab Results  Component Value Date   WBC 15.1 (H) 04/03/2021   NEUTROABS 7.1 04/03/2021   HGB 11.6 (L) 04/03/2021   HCT 37.0 04/03/2021   MCV 83.1 04/03/2021   PLT 377 04/03/2021    Imaging:  CLINICAL DATA:  40 year old female with low back pain radiating down both legs without improvement.   EXAM: MRI LUMBAR SPINE WITHOUT AND WITH CONTRAST   TECHNIQUE: Multiplanar and multiecho pulse sequences of the lumbar spine were obtained without and with intravenous contrast.   CONTRAST:  41mL GADAVIST GADOBUTROL 1 MMOL/ML IV SOLN   COMPARISON:  Lumbar MRI 03/20/2005.   FINDINGS: Segmentation: Lumbar segmentation appears to be normal and will be designated as such for this report.   Alignment: Straightening of lumbar lordosis is stable since 2006. No spondylolisthesis.   Vertebrae: No marrow edema or evidence of acute osseous abnormality. Visualized bone marrow signal is within normal limits. Benign vertebral body hemangioma at L2 on the right. Intact visible sacrum and SI joints.   Conus medullaris and cauda equina: Conus extends to the L2 level. No lower spinal cord or conus signal abnormality.   From the conus to the mid L4 vertebral level the cauda equina nerve roots appear normal.   Centered just below the L4-L5 disc a smooth round 11 mm homogeneously enhancing soft tissue mass occupies most of the spinal canal (series 10, image 9 and series 11, image 22)  and is new since 2006.   Axial postcontrast images raise the possibility of a dural based  mass, but on axial T2 images (series 7, images 22 and 23) the lesion appears to be intradural, with nerve roots displaced laterally on both sides.   No other abnormal intradural enhancement. No dural thickening identified. Below the lesion the cauda equina nerve roots again appear normal.   Paraspinal and other soft tissues: Heterogeneous STIR hyperintensity and enhancement at the right pelvic inlet (series 10, image 10) is probably the right adnexa. Nearby uterine fundus with evidence of a small fundal fibroid (series 6, image 10). Visualized abdominal viscera and paraspinal soft tissues are within normal limits.   Disc levels:   T11-T12: Negative.   T12-L1:  Negative.   L1-L2:  Negative.   L2-L3:  Negative.   L3-L4: Mild far lateral disc bulging. Mild facet hypertrophy. No stenosis.   L4-L5: Minor circumferential disc bulge eccentric to the left. Mild facet and ligament flavum hypertrophy. Spinal stenosis primarily due to the enhancing soft tissue mass. No foraminal stenosis.   L5-S1:  Mild facet hypertrophy, otherwise negative.   IMPRESSION: 1. 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 Recommend follow-up with Neurosurgery. 2. Otherwise largely unremarkable for age MRI appearance of the lumbar spine.     Electronically Signed   By: Genevie Ann M.D.   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  She is now roughly one month s/p resection with Dr. Saintclair Halsted.    We discussed histology, staging, and prognosis for paraganglioma.  Has not yet been assessed for secretory status.  She understands these tumors often exhibit benign behavior, but have the potential for malignant activity or transformation.    We recommended the following: -Whole body FDG-PET to rule out systemic metastases -Repeat MRI Lumbar spine w and w/o contrast to assess operative site, baseline for future studies -Plasma and urine catecholamines and metanephrine to assess secretory status, especially given her symptom burden (hypertensive today) -Referral to cancer geneticist Shannon [name] for evaluation  We appreciate the opportunity to participate in the care of Shannon [name].   Screening for potential clinical trials was performed and discussed using eligibility criteria for active protocols at [facility], loco-regional tertiary centers, as well as national database available on [email or web address].    The patient is not a candidate for a research protocol at this time due to no suitable study identified.   We spent twenty additional minutes teaching regarding the natural history, biology, and historical experience in the treatment of brain tumors. We then discussed in detail the current recommendations for therapy focusing on the mode of administration, mechanism of action, anticipated toxicities, and quality of life issues associated with this plan. We also provided teaching sheets for the patient to take home as an additional resource.  Shannon [name] should follow up in clinic once above testing is completed, in 2-3 months, or sooner if needed.  All questions were answered. The patient knows to call the clinic with any problems, questions or concerns. No barriers to learning were detected.  The total time spent in the encounter was 60  minutes and more than 50% was on counseling and review of test results   Ventura Sellers, MD Medical Director of Neuro-Oncology [facility] 04/13/21 10:59 AM

## 2021-04-14 ENCOUNTER — Encounter: Payer: Self-pay | Admitting: Physical Medicine and Rehabilitation

## 2021-04-14 ENCOUNTER — Encounter
Payer: BC Managed Care – PPO | Attending: Physical Medicine and Rehabilitation | Admitting: Physical Medicine and Rehabilitation

## 2021-04-14 ENCOUNTER — Telehealth: Payer: Self-pay | Admitting: Genetic Counselor

## 2021-04-14 VITALS — BP 135/91 | HR 119 | Temp 98.9°F | Ht 62.0 in | Wt 267.8 lb

## 2021-04-14 DIAGNOSIS — M5416 Radiculopathy, lumbar region: Secondary | ICD-10-CM

## 2021-04-14 DIAGNOSIS — D447 Neoplasm of uncertain behavior of aortic body and other paraganglia: Secondary | ICD-10-CM

## 2021-04-14 DIAGNOSIS — C729 Malignant neoplasm of central nervous system, unspecified: Secondary | ICD-10-CM | POA: Diagnosis not present

## 2021-04-14 DIAGNOSIS — G8918 Other acute postprocedural pain: Secondary | ICD-10-CM

## 2021-04-14 DIAGNOSIS — M7918 Myalgia, other site: Secondary | ICD-10-CM

## 2021-04-14 DIAGNOSIS — R000 Tachycardia, unspecified: Secondary | ICD-10-CM | POA: Diagnosis present

## 2021-04-14 NOTE — Patient Instructions (Signed)
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  Here for transitional care visit on her LE weakness due to ependymoma   Will see how does at [facility] for PT- if not making the gains we want her to, will switch her to [facility].   2. Plan for Cancer Center- Dr Mickeal Skinner- a whole body PET scan, f/u Lumbar MRI; Plasma and urine catecholamines and metanephrine burden and cancer geneticist.   3. Tennis ball- come in 3's- put one in car and one in house- hold pressure on spots that bother you- on low back  minimum 2 minutes- and max ~ 4 minutes- however if doing back of thigh or buttock, I suggest 5-10 minutes on each spot. DON"T MASSAGE-  just hold pressure- can look up on youtube- look up tennis balls and myofascial release!  4. Can use rolling pin or foam roller, but not able to do yourself- would need help.   5. If won't calm down on it own, I can do trigger point injections-   6. Speak to PCP about pulse//heart rate and BP elevation and Vyvanse.  BP 135/91 and HR 117 today. Also c/o a lot of sweating- wondering if producing too much catecholamines.    7.  Wants to keep baclofen at current dose- will call me if wants to increase Baclofen dose.   8. Will possibly do trigger point injections at next visit, if need be.   9. F/U in 6-8 weeks.

## 2021-04-14 NOTE — Progress Notes (Signed)
Subjective:    Patient ID: Shannon [name], female    DOB: 1981-06-30, 40 y.o.   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  Here for transitional care visit on her LE weakness due to ependymoma   Pain or stiffness on R low back that's still an issue.  Also has pulling/stiffness in R hamstrings. Has done with tennis ball- but only >1 minute at a time.   Wasn't getting relief- in PT with tennis ball either.   Doing outpt PT- no OT.  Still taking Baclofen  5mg  2x/day- morning and evening-   Takes flexeril 10 mg QHS Not taking the oxycodone at all.  Very tired with 2 appointment son 1 day.  Going to [facility]-  Not Neurorehab-   Hasn't seen PCP- scheduling next week.  Saw Dr Mickeal Skinner yesterday- Note documented in Plan-     Has been somewhat irritable lately- and also thinks it's frustrating because cannot do what she wants/needs to do.  Mother drive her here today.    Pain Inventory Average Pain 7 Pain Right Now 8 My pain is  tightening  In the last 24 hours, has pain interfered with the following? General activity 0 Relation with others 0 Enjoyment of life 0 What TIME of day is your pain at its worst? varies Sleep (in general) Fair  Pain is worse with: unsure Pain improves with:  not answered Relief from Meds:  no pain med taken  walk without assistance  I need assistance with the following:  meal prep, household duties, and shopping  No problems in this area  Any changes since last visit?  no  Saw Dr Mickeal Skinner yesterday    Family History  Problem Relation Age of Onset   Hypertension Mother    Diabetes Mother    Mental illness Mother    Hyperlipidemia Mother    Stroke Mother    Depression Mother    Anxiety disorder Mother    Bipolar disorder Mother    Liver  disease Mother    Sleep apnea Mother    Obesity Mother    Hypertension Father    Prostate cancer Father    Asthma Brother    Heart disease Maternal Grandmother    Hypertension Maternal Grandmother    Cancer Paternal Grandmother        breast cancer   Seizures Brother    Social History   Socioeconomic History   Marital status: Married    Spouse name: Not on file   Number of children: Not on file   Years of education: Not on file   Highest education level: Not on file  Occupational History   Not on file  Tobacco Use   Smoking status: Never   Smokeless tobacco: Never  Vaping Use   Vaping Use: Never used  Substance and Sexual Activity   Alcohol use: No    Comment: occasionally   Drug use: No   Sexual activity: Yes    Birth control/protection: None  Other Topics Concern   Not on file  Social History Narrative   Not on file   Social Determinants of Health   Financial Resource Strain: Not on file  Food Insecurity: Not on file  Transportation Needs: Not on file  Physical Activity: Not on file  Stress: Not on  file  Social Connections: Not on file   Past Surgical History:  Procedure Laterality Date   DILATATION & CURRETTAGE/HYSTEROSCOPY WITH RESECTOCOPE N/A 02/03/2014   Procedure: DILATATION & CURETTAGE/HYSTEROSCOPY ;  Surgeon: Delice Lesch, MD;  Location: [facility];  Service: Gynecology;  Laterality: N/A;   DILATION AND CURETTAGE OF UTERUS     LAMINECTOMY N/A 03/17/2021   Procedure: Laminectomy - Lumbar Four-Lumbar Five for Intradural mass;  Surgeon: Kary Kos, MD;  Location: [facility];  Service: Neurosurgery;  Laterality: N/A;  3C   MYOMECTOMY N/A 02/03/2014   Procedure: MYOMECTOMY ;  Surgeon: Delice Lesch, MD;  Location: [facility];  Service: Gynecology;  Laterality: N/A;   SALPINGECTOMY     TONSILLECTOMY     tonsils and adnoids removed     TUBAL LIGATION N/A 08/19/2015   Procedure: POST PARTUM TUBAL LIGATION;  Surgeon: Everett Graff, MD;  Location: [facility];  Service:  Gynecology;  Laterality: N/A;   WISDOM TOOTH EXTRACTION     Past Medical History:  Diagnosis Date   ADD (attention deficit disorder)    Anemia    Anxiety    Asthma    Back pain    Bilateral swelling of feet    Chest pain    Constipation    Depression    Hyperlipidemia    Hypertension    Preeclampsia post delivery   Irregular heart beat    Joint pain    Multiple food allergies    Obesity    Palpitations in pediatric patient    PONV (postoperative nausea and vomiting)    PONV; Also emotional upon waking up   Shortness of breath    Vitamin D deficiency    BP (!) 135/91   Pulse (!) 119   Temp 98.9 F (37.2 C)   Ht 5\' 2"  (1.575 m)   Wt 267 lb 12.8 oz (121.5 kg)   SpO2 97%   BMI 48.98 kg/m   Opioid Risk Score:   Fall Risk Score:  `1  Depression screen PHQ 2/9  Depression screen [facility] 2/9 04/14/2021 12/27/2020 12/23/2019 07/08/2019 02/03/2019 11/25/2018 08/19/2018  Decreased Interest 1 3 2  0 0 0 0  Down, Depressed, Hopeless 0 3 1 0 0 0 0  PHQ - 2 Score 1 6 3  0 0 0 0  Altered sleeping 1 3 2  - - - -  Tired, decreased energy 2 3 2  - - - -  Change in appetite 1 0 1 - - - -  Feeling bad or failure about yourself  0 0 0 - - - -  Trouble concentrating 1 3 3  - - - -  Moving slowly or fidgety/restless 1 0 1 - - - -  Suicidal thoughts 0 0 0 - - - -  PHQ-9 Score 7 15 12  - - - -  Difficult doing work/chores Very difficult Extremely dIfficult Somewhat difficult - - - -    Review of Systems  Constitutional: Negative.   HENT: Negative.    Eyes: Negative.   Respiratory: Negative.    Cardiovascular: Negative.   Gastrointestinal: Negative.   Endocrine: Negative.   Genitourinary: Negative.   Musculoskeletal:  Positive for back pain.  Skin: Negative.   Allergic/Immunologic: Negative.   Neurological: Negative.   Hematological: Negative.   Psychiatric/Behavioral: Negative.    All other systems reviewed and are negative.     Objective:   Physical Exam Pulse 117- at rest and BP  135/91;  Awake, alert, appropriate, edgy, standing up and sitting  down so much during visit, c/o tightness on R lumbar/thoracic paraspinals. NAD Tight/TTP over R mid thoracic to lumbar paraspinals Also Tight trigger point in R upper hamstrings in the middle of posterior thigh.   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  Here for transitional care visit on her LE weakness due to ependymoma   Will see how does at [facility] for PT- if not making the gains we want her to, will switch her to [facility].   2. Plan for Cancer [facility]- Dr Mickeal Skinner- a whole body PET scan, f/u Lumbar MRI; Plasma and urine catecholamines and metanephrine burden and cancer geneticist.   3. Tennis ball- come in 3's- put one in car and one in house- hold pressure on spots that bother you- on low back  minimum 2 minutes- and max ~ 4 minutes- however if doing back of thigh or buttock, I suggest 5-10 minutes on each spot. DON"T MASSAGE-  just hold pressure- can look up on youtube- look up tennis balls and myofascial release!  4. Can use rolling pin or foam roller, but not able to do yourself- would need help.   5. If won't calm down on it own, I can do trigger point injections-   6. Speak to PCP about pulse//heart rate and BP elevation and Vyvanse.  BP 135/91 and HR 117 today. Also c/o a lot of sweating- wondering if producing too much catecholamines.    7.  Wants to keep baclofen at current dose- will call me if wants to increase Baclofen dose.   8. Will possibly do trigger  point injections at next visit, if need be.   9. F/U in 6-8 weeks.    I spent a total of 31 minutes on visit- discussing options for muscle tightness and how to treat- gave resources as well.

## 2021-04-14 NOTE — Telephone Encounter (Signed)
Scheduled appt per 6/23 sch msg. Pt aware.

## 2021-04-17 ENCOUNTER — Encounter: Payer: Self-pay | Admitting: Internal Medicine

## 2021-04-18 ENCOUNTER — Ambulatory Visit (INDEPENDENT_AMBULATORY_CARE_PROVIDER_SITE_OTHER): Payer: BC Managed Care – PPO | Admitting: Internal Medicine

## 2021-04-18 ENCOUNTER — Other Ambulatory Visit: Payer: Self-pay

## 2021-04-18 ENCOUNTER — Encounter: Payer: Self-pay | Admitting: Internal Medicine

## 2021-04-18 VITALS — BP 118/72 | HR 109 | Temp 99.1°F | Ht 62.0 in | Wt 266.6 lb

## 2021-04-18 DIAGNOSIS — I10 Essential (primary) hypertension: Secondary | ICD-10-CM

## 2021-04-18 DIAGNOSIS — D447 Neoplasm of uncertain behavior of aortic body and other paraganglia: Secondary | ICD-10-CM

## 2021-04-18 DIAGNOSIS — R000 Tachycardia, unspecified: Secondary | ICD-10-CM | POA: Diagnosis not present

## 2021-04-18 DIAGNOSIS — R0602 Shortness of breath: Secondary | ICD-10-CM | POA: Diagnosis not present

## 2021-04-18 DIAGNOSIS — Z09 Encounter for follow-up examination after completed treatment for conditions other than malignant neoplasm: Secondary | ICD-10-CM

## 2021-04-18 DIAGNOSIS — D72829 Elevated white blood cell count, unspecified: Secondary | ICD-10-CM

## 2021-04-18 LAB — CBC AND DIFFERENTIAL
HCT: 38 (ref 36–46)
Hemoglobin: 12.1 (ref 12.0–16.0)
Platelets: 280 (ref 150–399)
WBC: 4.9

## 2021-04-18 LAB — VITAMIN D 25 HYDROXY (VIT D DEFICIENCY, FRACTURES): Vit D, 25-Hydroxy: 26.3

## 2021-04-18 LAB — TSH: TSH: 2.06 (ref 0.41–5.90)

## 2021-04-18 LAB — HIV ANTIBODY (ROUTINE TESTING W REFLEX): HIV 1&2 Ab, 4th Generation: NEGATIVE

## 2021-04-18 LAB — CBC: RBC: 4.75 (ref 3.87–5.11)

## 2021-04-18 NOTE — Progress Notes (Signed)
I,Katawbba Wiggins,acting as a Education administrator for Maximino Greenland, MD.,have documented all relevant documentation on the behalf of Maximino Greenland, MD,as directed by  Maximino Greenland, MD while in the presence of Maximino Greenland, MD.  This visit occurred during the SARS-CoV-2 public health emergency.  Safety protocols were in place, including screening questions prior to the visit, additional usage of staff PPE, and extensive cleaning of exam room while observing appropriate contact time as indicated for disinfecting solutions.  Subjective:     Patient ID: Shannon [name] , female    DOB: [birth year] , 40 y.o.   MRN: [id number]   Chief Complaint  Patient presents with   Hospitalization Follow-up    HPI  The patient is here today for a hospital f/u. She was admitted to [facility] on 03/17/21 for lumbar spine surgery. In May 2022, she presented to ER on 2 occasions with persistent back pain. MRI revealed 31m intradural tumor which prompted Neurosurgical evaluation. 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 Pathology report demonstrated paraganglioma.  She was then discharged from the hospital, and then admitted into rehab on 6/3.  She reports rehab did help improve her strength. Her back pain has improved as well. She was discharged from rehab on 04/06/21 Over past month, no new or progressive deficits.[facility] is now followed by Oncology, [facility].   She is appreciative of the care given to her by her surgical team and rehab team; however, she felt she was mistreated during her initial hospitalization. She states she was not taught how to use the bathroom and has been told by others that the nurses were not as attentive as they should have been. She plans to file a  grievance.     Past Medical History:  Diagnosis Date   ADD (attention deficit disorder)    Anemia    Anxiety    Asthma    Back pain    Bilateral swelling of feet    Chest pain    Constipation    Depression    Family history of breast cancer 04/28/2021   Family history of ovarian cancer 04/28/2021   Family history of pancreatic cancer 04/28/2021   Hyperlipidemia    Hypertension    Preeclampsia post delivery   Irregular heart beat    Joint pain    Multiple food allergies    Obesity    Palpitations in pediatric patient    PONV (postoperative nausea and vomiting)    PONV; Also emotional upon waking up   Shortness of breath    Vitamin D deficiency      Family History  Problem Relation Age of Onset   Hypertension Mother    Diabetes Mother    Mental illness Mother    Hyperlipidemia Mother    Stroke Mother    Depression Mother    Anxiety disorder Mother    Bipolar disorder Mother    Liver disease Mother    Sleep apnea Mother    Obesity Mother    Hypertension Father    Asthma Brother    Seizures Brother    Pancreatic cancer Maternal Aunt 590  Lung cancer Maternal Aunt        dx after 50; smoking hx   Liver cancer Maternal Uncle  dx after 55   Breast cancer Paternal Aunt        two paternal aunts, age 80s-40s   Ovarian cancer Paternal Aunt        two paternal aunts, reported BRCA mutation   Colon polyps Paternal Uncle        two paternal uncles; more than 22 lifetime polyps   Heart disease Maternal Grandmother    Hypertension Maternal Grandmother    Breast cancer Maternal Grandmother        dx 11s-40s; d. 15   Ovarian cancer Paternal Grandmother    Ovarian cancer Other        PGM's mother   Breast cancer Cousin        dx 13s   Ovarian cancer Cousin        dx 30s     Current Outpatient Medications:    acetaminophen (TYLENOL) 325 MG tablet, Take 1-2 tablets (325-650 mg total) by mouth every 4 (four) hours as needed for mild pain., Disp: , Rfl:     albuterol (PROVENTIL HFA;VENTOLIN HFA) 108 (90 Base) MCG/ACT inhaler, Inhale 2 puffs into the lungs every 6 (six) hours as needed for wheezing or shortness of breath., Disp: 1 Inhaler, Rfl: 3   Baclofen 5 MG TABS, Take 1 tablet by mouth 3 (three) times daily as needed for muscle spasms., Disp: 60 tablet, Rfl: 0   carvedilol (COREG) 6.25 MG tablet, Take 1 tablet (6.25 mg total) by mouth 2 (two) times daily with a meal., Disp: 60 tablet, Rfl: 0   cyclobenzaprine (FLEXERIL) 10 MG tablet, Take 1 tablet (10 mg total) by mouth at bedtime. As needed for back pain, Disp: 30 tablet, Rfl: 0   DULoxetine (CYMBALTA) 60 MG capsule, Take 1 capsule by mouth daily. , Disp: , Rfl: 1   gabapentin (NEURONTIN) 300 MG capsule, Take 1 capsule (300 mg total) by mouth 3 (three) times daily., Disp: 90 capsule, Rfl: 0   ibuprofen (ADVIL) 800 MG tablet, Take 1 tablet (800 mg total) by mouth every 8 (eight) hours as needed for mild pain., Disp: 15 tablet, Rfl: 0   melatonin 5 MG TABS, Take 2 tablets (10 mg total) by mouth at bedtime., Disp: 60 tablet, Rfl: 0   pantoprazole (PROTONIX) 40 MG tablet, Take 1 tablet (40 mg total) by mouth at bedtime., Disp: 30 tablet, Rfl: 0   VYVANSE 70 MG capsule, Take 70 mg by mouth every morning., Disp: , Rfl:    Allergies  Allergen Reactions   Diclofenac Rash   Pineapple Shortness Of Breath   Diflucan [Fluconazole] Hives    Raw rash on abdomen and she had sores and swelling in mouth    Bactrim [Sulfamethoxazole-Trimethoprim] Hives   Ceftriaxone Sodium Rash    No SOB - patient willing to try again   Latex Rash     Review of Systems  Constitutional: Negative.   Respiratory:  Positive for shortness of breath.   Cardiovascular:  Positive for palpitations.  Gastrointestinal: Negative.   Musculoskeletal:  Positive for back pain.  Neurological: Negative.   Psychiatric/Behavioral: Negative.      Today's Vitals   04/18/21 0951  BP: 118/72  Pulse: (!) 109  Temp: 99.1 F (37.3 C)   TempSrc: Oral  Weight: 266 lb 9.6 oz (120.9 kg)  Height: 5' 2"  (1.575 m)  PainSc: 6   PainLoc: Head   Body mass index is 48.76 kg/m.  Wt Readings from Last 3 Encounters:  04/18/21 266 lb 9.6 oz (120.9 kg)  04/14/21 267 lb 12.8 oz (121.5 kg)  04/13/21 267 lb 1.6 oz (121.2 kg)    BP Readings from Last 3 Encounters:  04/18/21 118/72  04/14/21 (!) 135/91  04/13/21 (!) 139/110    Objective:  Physical Exam Vitals and nursing note reviewed.  Constitutional:      Appearance: Normal appearance.  HENT:     Head: Normocephalic and atraumatic.     Nose:     Comments: Masked     Mouth/Throat:     Comments: Masked  Cardiovascular:     Rate and Rhythm: Normal rate and regular rhythm.     Heart sounds: Normal heart sounds.  Pulmonary:     Effort: Pulmonary effort is normal.     Breath sounds: Normal breath sounds.  Musculoskeletal:     Cervical back: Normal range of motion.  Skin:    General: Skin is warm.  Neurological:     General: No focal deficit present.     Mental Status: She is alert.  Psychiatric:        Mood and Affect: Mood normal.        Behavior: Behavior normal.        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 I will place referral to [facility] as requested. She will look into [place] on her own.  - Ambulatory referral to Hematology / Oncology  2. Essential hypertension, benign Comments: Recently diagnosed, now on carvedilol BID. She will c/w current meds. ???pheochromocytoma. Plasma/urine catecholamines pending.   3. Tachycardia Comments: I will refer her to Cardiology for further evaluation. She is in agreement with her treatment plan. I will check labs as listed below.  - Magnesium - TSH - Ambulatory referral to Cardiology  4. Shortness of breath Comments: I will refer her for Echocardiogram and Cardiology evaluation. Some of this could also be due to deconditioning.  -  ECHOCARDIOGRAM COMPLETE; Future - Brain natriuretic peptide (79390) - CBC with Diff - Ambulatory referral to Cardiology  5. Leukocytosis, unspecified type Comments: I will check repeat CBC w/ diff today. However, this elevation is likely related to prednisone.  - CBC with Diff  6. Hospital discharge follow-up    Patient was given opportunity to ask questions. Patient verbalized understanding of the plan and was able to repeat key elements of the plan. All questions were answered to their satisfaction.   I, Maximino Greenland, MD, have reviewed all documentation for this visit. The documentation on 05/14/21 for the exam, diagnosis, procedures, and orders are all accurate and complete.   IF YOU HAVE BEEN REFERRED TO A SPECIALIST, IT MAY TAKE 1-2 WEEKS TO SCHEDULE/PROCESS THE REFERRAL. IF YOU HAVE NOT HEARD FROM US/SPECIALIST IN TWO WEEKS, PLEASE GIVE [place] A CALL AT [phone] X [phone].   THE PATIENT IS ENCOURAGED TO PRACTICE SOCIAL DISTANCING DUE TO THE COVID-19 PANDEMIC.

## 2021-04-19 LAB — CBC WITH DIFFERENTIAL/PLATELET
Basophils Absolute: 0 10*3/uL (ref 0.0–0.2)
Basos: 1 %
EOS (ABSOLUTE): 0.4 10*3/uL (ref 0.0–0.4)
Eos: 7 %
Hematocrit: 40.3 % (ref 34.0–46.6)
Hemoglobin: 12.7 g/dL (ref 11.1–15.9)
Immature Grans (Abs): 0 10*3/uL (ref 0.0–0.1)
Immature Granulocytes: 0 %
Lymphocytes Absolute: 1.9 10*3/uL (ref 0.7–3.1)
Lymphs: 38 %
MCH: 25.5 pg — ABNORMAL LOW (ref 26.6–33.0)
MCHC: 31.5 g/dL (ref 31.5–35.7)
MCV: 81 fL (ref 79–97)
Monocytes Absolute: 0.3 10*3/uL (ref 0.1–0.9)
Monocytes: 7 %
Neutrophils Absolute: 2.4 10*3/uL (ref 1.4–7.0)
Neutrophils: 47 %
Platelets: 220 10*3/uL (ref 150–450)
RBC: 4.98 x10E6/uL (ref 3.77–5.28)
RDW: 14.5 % (ref 11.7–15.4)
WBC: 5.1 10*3/uL (ref 3.4–10.8)

## 2021-04-19 LAB — MAGNESIUM: Magnesium: 2.1 mg/dL (ref 1.6–2.3)

## 2021-04-19 LAB — TSH: TSH: 2.65 u[IU]/mL (ref 0.450–4.500)

## 2021-04-19 LAB — BRAIN NATRIURETIC PEPTIDE: BNP: <2.5 pg/mL (ref 0.0–100.0)

## 2021-04-27 ENCOUNTER — Other Ambulatory Visit: Payer: Self-pay | Admitting: *Deleted

## 2021-04-27 ENCOUNTER — Inpatient Hospital Stay: Payer: BC Managed Care – PPO | Attending: Internal Medicine | Admitting: Genetic Counselor

## 2021-04-27 ENCOUNTER — Other Ambulatory Visit: Payer: Self-pay

## 2021-04-27 ENCOUNTER — Other Ambulatory Visit: Payer: Self-pay | Admitting: Genetic Counselor

## 2021-04-27 ENCOUNTER — Inpatient Hospital Stay: Payer: BC Managed Care – PPO

## 2021-04-27 DIAGNOSIS — Z8041 Family history of malignant neoplasm of ovary: Secondary | ICD-10-CM | POA: Insufficient documentation

## 2021-04-27 DIAGNOSIS — D447 Neoplasm of uncertain behavior of aortic body and other paraganglia: Secondary | ICD-10-CM

## 2021-04-27 DIAGNOSIS — Z800 Family history of malignant neoplasm of digestive organs: Secondary | ICD-10-CM | POA: Diagnosis not present

## 2021-04-27 DIAGNOSIS — Z803 Family history of malignant neoplasm of breast: Secondary | ICD-10-CM | POA: Diagnosis not present

## 2021-04-27 LAB — GENETIC SCREENING ORDER

## 2021-04-28 ENCOUNTER — Telehealth: Payer: Self-pay | Admitting: Internal Medicine

## 2021-04-28 ENCOUNTER — Encounter: Payer: Self-pay | Admitting: Genetic Counselor

## 2021-04-28 DIAGNOSIS — Z800 Family history of malignant neoplasm of digestive organs: Secondary | ICD-10-CM

## 2021-04-28 DIAGNOSIS — Z803 Family history of malignant neoplasm of breast: Secondary | ICD-10-CM | POA: Insufficient documentation

## 2021-04-28 DIAGNOSIS — Z8041 Family history of malignant neoplasm of ovary: Secondary | ICD-10-CM

## 2021-04-28 HISTORY — DX: Family history of malignant neoplasm of breast: Z80.3

## 2021-04-28 HISTORY — DX: Family history of malignant neoplasm of ovary: Z80.41

## 2021-04-28 HISTORY — DX: Family history of malignant neoplasm of digestive organs: Z80.0

## 2021-04-28 NOTE — Progress Notes (Addendum)
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 Family history of breast cancer   3. Family history of pancreatic cancer   4. Family history of ovarian cancer     HISTORY OF PRESENT ILLNESS:   Shannon [name], a 40 y.o. female, was seen for a [facility] cancer genetics consultation at the request of Dr. Mickeal Skinner due to a personal history of a paraganglioma.  Shannon [name] presents to clinic today to discuss the possibility of a hereditary predisposition to cancer and tumors, to discuss genetic testing, and to further clarify her future cancer risks, as well as potential cancer risks for family members.   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  She also has complained of headaches, excess sweating, palpitations at times.  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  Whole body FDG-PET and plasma and urine catecholamines and metanephrine are pending.   CANCER HISTORY:  Oncology History   No history exists.    RISK FACTORS:  Menarche was at age 85 or 74.  First live birth at age 56.  OCP use for approximately 5 years.  Ovaries intact: yes.  Hysterectomy: no.  Menopausal status: premenopausal.  HRT use: 0 years. Colonoscopy: no; not examined. Mammogram within the last year: yes. Number of breast biopsies: 0. Up to date with pelvic exams: due for exam per patient. Denies history of tobacco use.   Past Medical History:  Diagnosis Date   ADD (attention deficit disorder)    Anemia    Anxiety    Asthma    Back pain    Bilateral swelling of feet    Chest pain    Constipation    Depression    Family history of breast cancer 04/28/2021   Family history of ovarian cancer 04/28/2021   Family history of pancreatic cancer  04/28/2021   Hyperlipidemia    Hypertension    Preeclampsia post delivery   Irregular heart beat    Joint pain    Multiple food allergies    Obesity    Palpitations in pediatric patient    PONV (postoperative nausea and vomiting)    PONV; Also emotional upon waking up   Shortness of breath    Vitamin D deficiency     Past Surgical History:  Procedure Laterality Date   DILATATION & CURRETTAGE/HYSTEROSCOPY WITH RESECTOCOPE N/A 02/03/2014   Procedure: DILATATION & CURETTAGE/HYSTEROSCOPY ;  Surgeon: Delice Lesch, MD;  Location: [facility];  Service: Gynecology;  Laterality: N/A;   DILATION AND CURETTAGE OF UTERUS     LAMINECTOMY N/A 03/17/2021   Procedure: Laminectomy - Lumbar Four-Lumbar Five for Intradural mass;  Surgeon: Kary Kos, MD;  Location: [facility];  Service: Neurosurgery;  Laterality: N/A;  3C   MYOMECTOMY N/A 02/03/2014   Procedure: MYOMECTOMY ;  Surgeon: Delice Lesch, MD;  Location: [facility];  Service: Gynecology;  Laterality: N/A;   SALPINGECTOMY     TONSILLECTOMY     tonsils and adnoids removed     TUBAL LIGATION N/A 08/19/2015   Procedure: POST PARTUM TUBAL LIGATION;  Surgeon: Everett Graff, MD;  Location: [facility];  Service: Gynecology;  Laterality: N/A;   [place] EXTRACTION      Social History  Socioeconomic History   Marital status: Married    Spouse name: Not on file   Number of children: Not on file   Years of education: Not on file   Highest education level: Not on file  Occupational History   Not on file  Tobacco Use   Smoking status: Never   Smokeless tobacco: Never  Vaping Use   Vaping Use: Never used  Substance and Sexual Activity   Alcohol use: No    Comment: occasionally   Drug use: No   Sexual activity: Yes    Birth control/protection: None  Other Topics Concern   Not on file  Social History Narrative   Not on file   Social Determinants of Health   Financial Resource Strain: Not on file  Food Insecurity: Not on file   Transportation Needs: Not on file  Physical Activity: Not on file  Stress: Not on file  Social Connections: Not on file     FAMILY HISTORY:  We obtained a detailed, 4-generation family history.  Significant diagnoses are listed below: Family History  Problem Relation Age of Onset   Hypertension Mother    Hypertension Father    Asthma Brother    Seizures Brother    Pancreatic cancer Maternal Aunt 34   Lung cancer Maternal Aunt        dx after 65; smoking hx   Liver cancer Maternal Uncle        dx after 77   Breast cancer Paternal Aunt        two paternal aunts, age 20s-40s   Ovarian cancer Paternal Aunt        two paternal aunts, reported BRCA mutation   Colon polyps Paternal Uncle        two paternal uncles; more than 13 lifetime polyps   Heart disease Maternal Grandmother    Hypertension Maternal Grandmother    Breast cancer Maternal Grandmother        dx 30s-40s; d. 75   Ovarian cancer Paternal Grandmother    Ovarian cancer Other        PGM's mother   Breast cancer Cousin        dx 41s   Ovarian cancer Cousin        dx 8s     Shannon [name] is unaware of previous family history of genetic testing for hereditary cancer risks in her maternal family. She reports that several paternal aunts, uncles, and cousins have a BRCA mutation.  No report was available for review today; however, a family report was requested. Patient's maternal ancestors are of Jewish descent, and paternal ancestors are of Jewish descent. Patient's mother reported possible consanguinity between herself and Shannon [name]'s father, of unknown relationship (likely more distant than 3rd degree relative).   GENETIC COUNSELING ASSESSMENT: Shannon [name] is a 40 y.o. female with a personal history of paraganglioma and a family history suggestive a hereditary cancer syndrome. We, therefore, discussed and recommended the following at today's visit.   DISCUSSION: We discussed that approximately 30% of paragangliomas  are hereditary.  Genes associated with hereditary paragangliomas include but are not limited to the SDHx genes and VHL.  There are other genes that can be associated with hereditary breast, ovarian, and pancreatic cancer syndromes.  If there is a BRCA or other breast/ovarian cancer gene mutation in her paternal aunts/uncles as reported, she has a 25% of also carrying the same mutation. We discussed that testing is beneficial for several reasons including knowing how to follow individuals  for their cancer and tumor risks, identifying whether potential treatment options would be beneficial, and understanding if other family members could be at risk for cancer or tumors and allowing them to undergo genetic testing.   We reviewed the characteristics, features and inheritance patterns of hereditary cancer syndromes. We also discussed genetic testing, including the appropriate family members to test, the process of testing, insurance coverage and turn-around-time for results. We discussed the implications of a negative, positive, carrier and/or variant of uncertain significant result. We recommended Shannon [name] pursue genetic testing for a panel that includes genes associated with paragangliomas, breast/ovarian cancer, pancreatic cancer, and polyposis.    The CancerNext-Expanded gene panel offered by [facility] and includes sequencing, rearrangement, and RNA analysis for the following 77 genes: AIP, ALK, APC, ATM, AXIN2, BAP1, BARD1, BLM, BMPR1A, BRCA1, BRCA2, BRIP1, CDC73, CDH1, CDK4, CDKN1B, CDKN2A, CHEK2, CTNNA1, DICER1, FANCC, FH, FLCN, GALNT12, KIF1B, LZTR1, MAX, MEN1, MET, MLH1, MSH2, MSH3, MSH6, MUTYH, NBN, NF1, NF2, NTHL1, PALB2, PHOX2B, PMS2, POT1, PRKAR1A, PTCH1, PTEN, RAD51C, RAD51D, RB1, RECQL, RET, SDHA, SDHAF2, SDHB, SDHC, SDHD, SMAD4, SMARCA4, SMARCB1, SMARCE1, STK11, SUFU, TMEM127, TP53, TSC1, TSC2, VHL and XRCC2 (sequencing and deletion/duplication); EGFR, EGLN1, HOXB13, KIT, MITF, PDGFRA, POLD1,  and POLE (sequencing only); EPCAM and GREM1 (deletion/duplication only).   Based on Shannon [name]'s personal history of a paraganglioma and family history of breast/ovarian cancer, she meets medical criteria for genetic testing. Despite that she meets criteria, she may still have an out of pocket cost. We discussed that if her out of pocket cost for testing is over $100, the laboratory will should contact her to discuss self-pay prices or patient pay assistance programs.   PLAN: After considering the risks, benefits, and limitations, Shannon [name] provided informed consent to pursue genetic testing and the blood sample was sent to [facility] for analysis of the CancerNext-Expanded +RNAinsight Panel. Results should be available within approximately 3 weeks' time, at which point they will be disclosed by telephone to Shannon [name], as will any additional recommendations warranted by these results. Shannon [name] will receive a summary of her genetic counseling visit and a copy of her results once available. This information will also be available in Epic.   Lastly, we encouraged Shannon [name] to remain in contact with cancer genetics annually so that we can continuously update the family history and inform her of any changes in cancer genetics and testing that may be of benefit for this family.   Shannon [name] was providing with information about the support organization, [employer], which some individuals have found helpful.   Shannon [name] questions were answered to her satisfaction today. Our contact information was provided should additional questions or concerns arise. Thank you for the referral and allowing [place] to share in the care of your patient.   Seung Nidiffer M. Joette Catching, [place], [facility] Genetic Counselor [email or web address] (P) [phone]  The patient was seen for a total of 40 minutes in face-to-face genetic counseling.  The patient brought her mother and her cousin, Shannon [name], to her  appointment.  Drs. Magrinat, Lindi Adie and/or Burr Medico were available to discuss this case as needed.    _______________________________________________________________________ For Office Staff:  Number of people involved in session: 3 Was an Intern/ student involved with case: no

## 2021-04-28 NOTE — Telephone Encounter (Signed)
Scheduled appt per 7/7 sch msg. Called pt, no answer. Left msg with appt date and time. 

## 2021-05-08 ENCOUNTER — Ambulatory Visit: Payer: Self-pay | Admitting: Genetic Counselor

## 2021-05-08 ENCOUNTER — Telehealth: Payer: Self-pay | Admitting: Genetic Counselor

## 2021-05-08 ENCOUNTER — Encounter: Payer: Self-pay | Admitting: Genetic Counselor

## 2021-05-08 DIAGNOSIS — Z800 Family history of malignant neoplasm of digestive organs: Secondary | ICD-10-CM

## 2021-05-08 DIAGNOSIS — Z1379 Encounter for other screening for genetic and chromosomal anomalies: Secondary | ICD-10-CM | POA: Insufficient documentation

## 2021-05-08 DIAGNOSIS — Z803 Family history of malignant neoplasm of breast: Secondary | ICD-10-CM

## 2021-05-08 DIAGNOSIS — D447 Neoplasm of uncertain behavior of aortic body and other paraganglia: Secondary | ICD-10-CM

## 2021-05-08 DIAGNOSIS — Z8041 Family history of malignant neoplasm of ovary: Secondary | ICD-10-CM

## 2021-05-08 HISTORY — DX: Encounter for other screening for genetic and chromosomal anomalies: Z13.79

## 2021-05-08 NOTE — Telephone Encounter (Signed)
Revealed negative genetic testing.  Discussed that we do not know why she has a paraganglioma or a family history of cancer. It could be sporadic/familial, due to a different gene that we are not testing, or maybe our current technology may not be able to pick something up.  It will be important for her to keep in contact with genetics to keep up with whether additional testing may be needed.  Discussed importance of reviewing previous family genetic testing reports to ensure that appropriate genes were ordered to help with interpretation of results. Ms. Shannon [name] plans to send family report from paternal family members, which reportedly shows mutation in breast/ovarian cancer gene.

## 2021-05-10 ENCOUNTER — Other Ambulatory Visit: Payer: Self-pay | Admitting: Radiation Therapy

## 2021-05-10 NOTE — Progress Notes (Signed)
HPI:  Shannon [name] was previously seen in the [facility] clinic due to a personal history of a paraganglioma, a family history of cancer, and concerns regarding a hereditary predisposition to cancer. Please refer to our prior cancer genetics clinic note for more information regarding our discussion, assessment and recommendations, at the time. Shannon [name] recent genetic test results were disclosed to her, as were recommendations warranted by these results. These results and recommendations are discussed in more detail below.  CANCER HISTORY:  Oncology History   No history exists.    FAMILY HISTORY:    We obtained a detailed, 4-generation family history.  Significant diagnoses are listed below:      Family History  Problem Relation Age of Onset   Hypertension Mother     Hypertension Father     Asthma Brother     Seizures Brother     Pancreatic cancer Maternal Aunt 13   Lung cancer Maternal Aunt          dx after 75; smoking hx   Liver cancer Maternal Uncle          dx after 60   Breast cancer Paternal Aunt          two paternal aunts, age 38s-40s   Ovarian cancer Paternal Aunt          two paternal aunts, reported BRCA mutation   Colon polyps Paternal Uncle          two paternal uncles; more than 58 lifetime polyps   Heart disease Maternal Grandmother     Hypertension Maternal Grandmother     Breast cancer Maternal Grandmother          dx 30s-40s; d. 2   Ovarian cancer Paternal Grandmother     Ovarian cancer Other          PGM's mother   Breast cancer Cousin          dx 21s   Ovarian cancer Cousin          dx 94s       Shannon [name] is unaware of previous family history of genetic testing for hereditary cancer risks in her maternal family. She reports that several paternal aunts, uncles, and cousins have a BRCA mutation.  No report was available for review today; however, a family report was requested. Patient's maternal ancestors are of Jewish descent, and  paternal ancestors are of Jewish descent. Patient's mother reported possible consanguinity between herself and Shannon [name]'s father, of unknown relationship (likely more  GENETIC TEST RESULTS: Genetic testing reported out on May 06, 2021.   The CancerNext-Expanded +RNAinsight Panel found no pathogenic mutations. The CancerNext-Expanded gene panel offered by [facility] and includes sequencing, rearrangement, and RNA analysis for the following 77 genes: AIP, ALK, APC, ATM, AXIN2, BAP1, BARD1, BLM, BMPR1A, BRCA1, BRCA2, BRIP1, CDC73, CDH1, CDK4, CDKN1B, CDKN2A, CHEK2, CTNNA1, DICER1, FANCC, FH, FLCN, GALNT12, KIF1B, LZTR1, MAX, MEN1, MET, MLH1, MSH2, MSH3, MSH6, MUTYH, NBN, NF1, NF2, NTHL1, PALB2, PHOX2B, PMS2, POT1, PRKAR1A, PTCH1, PTEN, RAD51C, RAD51D, RB1, RECQL, RET, SDHA, SDHAF2, SDHB, SDHC, SDHD, SMAD4, SMARCA4, SMARCB1, SMARCE1, STK11, SUFU, TMEM127, TP53, TSC1, TSC2, VHL and XRCC2 (sequencing and deletion/duplication); EGFR, EGLN1, HOXB13, KIT, MITF, PDGFRA, POLD1, and POLE (sequencing only); EPCAM and GREM1 (deletion/duplication only).   The test report has been scanned into EPIC and is located under the Molecular Pathology section of the Results Review tab.  A portion of the result report is included below for reference.  We discussed with Shannon [name] that because current genetic testing is not perfect, it is possible there may be a gene mutation in one of these genes that current testing cannot detect, but that chance is small.  We also discussed, that there could be another gene that has not yet been discovered, or that we have not yet tested, that is responsible for the cancer diagnoses in the family. It is also possible there is a hereditary cause for the cancer in the family that Shannon [name] did not inherit and therefore was not identified in her testing.  Therefore, it is important to remain in touch with cancer genetics in the future so that we can continue to offer Shannon [name] the most  up to date genetic testing.   Shannon [name] previously reported a BRCA mutation in her paternal family members; however, a report was unavailable at this time to confirm a mutation in family. Shannon [name] test did not reveal a mutation in a BRCA or other breast/ovarian cancer related genes.  If a mutation was detected in her paternal family members with cancer and she is verified to not have inherited the family mutation, we would call this result a true negative.  In that situation, Shannon [name]'s chances of developing cancers related to the reported family variant would be closer to that of the general population.  We discussed the importance of verifying the family mutation with a family member's test report, and Shannon [name] has my contact information to provide her family member's test report when available.   ADDITIONAL GENETIC TESTING:  We discussed with Shannon [name] that her genetic testing was fairly extensive.  If there are genes identified to increase cancer risk that can be analyzed in the future, we would be happy to discuss and coordinate this testing at that time.    CANCER SCREENING RECOMMENDATIONS: Shannon [name] test result is considered negative (normal).  This means that we have not identified a hereditary cause for her paraganglioma at this time.   This does not definitively rule out a hereditary predisposition to cancer. It is still possible that there could be genetic mutations that are undetectable by current technology. There could be genetic mutations in genes that have not been tested or identified to increase cancer risk.  Therefore, it is recommended she continue to follow the cancer management and screening guidelines provided by her oncology and primary healthcare provider.   An individual's cancer risk and medical management are not determined by genetic test results alone. Overall cancer risk assessment incorporates additional factors, including personal medical history, family  history, and any available genetic information that may result in a personalized plan for cancer prevention and surveillance  RECOMMENDATIONS FOR FAMILY MEMBERS:  Individuals in this family might be at some increased risk of developing cancer, over the general population risk, simply due to the family history of cancer.  We recommended women in this family have a yearly mammogram beginning at age 21, or 68 years younger than the earliest onset of cancer, an annual clinical breast exam, and perform monthly breast self-exams. Women in this family should also have a gynecological exam as recommended by their primary provider. Family members should be referred for colonoscopy starting at age 94, or earlier, as recommeded by their providers.  Shannon [name] family members should notify their providers of the family history of a paraganglioma.   Given the paternal family history of breast and ovarian cancer, we recommend that Shannon [name]'s paternal family members that  have had not had genetic testing for hereditary cancer risks consider genetic counseling and genetic testing.  Furthermore, given the maternal family history of breast and pancreatic cancer, we recommend that Shannon [name]'s mother consider genetic counseling and/or testing.  Shannon [name] can reach out to [place] if these family members are interested or need assistance in coordinating genetic counseling and/or testing.   FOLLOW-UP: Lastly, we discussed with Shannon [name] that cancer genetics is a rapidly advancing field and it is possible that new genetic tests will be appropriate for her and/or her family members in the future. We encouraged her to remain in contact with cancer genetics on an annual basis so we can update her personal and family histories and let her know of advances in cancer genetics that may benefit this family.   Our contact number was provided. Ms. Pennebaker questions were answered to her satisfaction, and she knows she is welcome to  call us at anytime with additional questions or concerns.     Cereniti Curb M. Joette Catching, [place], [facility] Genetic Counselor [email or web address] (P) [phone]

## 2021-05-12 DIAGNOSIS — Z800 Family history of malignant neoplasm of digestive organs: Secondary | ICD-10-CM | POA: Diagnosis not present

## 2021-05-12 DIAGNOSIS — Z8041 Family history of malignant neoplasm of ovary: Secondary | ICD-10-CM | POA: Diagnosis not present

## 2021-05-12 DIAGNOSIS — D447 Neoplasm of uncertain behavior of aortic body and other paraganglia: Secondary | ICD-10-CM | POA: Diagnosis present

## 2021-05-12 DIAGNOSIS — Z803 Family history of malignant neoplasm of breast: Secondary | ICD-10-CM | POA: Diagnosis not present

## 2021-05-16 LAB — METANEPHRINES, URINE, 24 HOUR
Metaneph Total, Ur: 79 ug/L
Metanephrines, 24H Ur: 138 ug/24 hr (ref 36–209)
Normetanephrine, 24H Ur: 334 ug/24 hr (ref 131–612)
Normetanephrine, Ur: 191 ug/L
Total Volume: 1750

## 2021-05-19 ENCOUNTER — Encounter: Payer: Self-pay | Admitting: Internal Medicine

## 2021-05-22 ENCOUNTER — Other Ambulatory Visit: Payer: Self-pay | Admitting: *Deleted

## 2021-05-22 ENCOUNTER — Ambulatory Visit (HOSPITAL_COMMUNITY)
Admission: RE | Admit: 2021-05-22 | Discharge: 2021-05-22 | Disposition: A | Discharge status: Home / Self Care | Payer: BC Managed Care – PPO | Source: Ambulatory Visit | Attending: Internal Medicine | Admitting: Internal Medicine

## 2021-05-22 ENCOUNTER — Other Ambulatory Visit: Payer: Self-pay

## 2021-05-22 DIAGNOSIS — D447 Neoplasm of uncertain behavior of aortic body and other paraganglia: Secondary | ICD-10-CM | POA: Diagnosis present

## 2021-05-22 LAB — GLUCOSE, CAPILLARY: Glucose-Capillary: 94 mg/dL (ref 70–99)

## 2021-05-22 MED ORDER — FLUDEOXYGLUCOSE F - 18 (FDG) INJECTION
13.3000 | Freq: Once | INTRAVENOUS | Status: AC
  Administered 2021-05-22: 13.3 via INTRAVENOUS

## 2021-05-24 LAB — CATECHOLAMINES,UR.,FREE,24 HR
Dopamine, Rand Ur: 396 ug/L
Dopamine, Ur, 24Hr: 238 ug/24 hr (ref 0–510)
Epinephrine, Rand Ur: 15 ug/L
Epinephrine, U, 24Hr: 9 ug/24 hr (ref 0–20)
Norepinephrine, Rand Ur: 85 ug/L
Norepinephrine,U,24H: 51 ug/24 hr (ref 0–135)
Total Volume: 600

## 2021-05-26 ENCOUNTER — Other Ambulatory Visit: Payer: Self-pay

## 2021-05-26 ENCOUNTER — Ambulatory Visit (HOSPITAL_COMMUNITY)
Admission: RE | Admit: 2021-05-26 | Discharge: 2021-05-26 | Disposition: A | Discharge status: Home / Self Care | Payer: BC Managed Care – PPO | Source: Ambulatory Visit | Attending: Internal Medicine | Admitting: Internal Medicine

## 2021-05-26 DIAGNOSIS — D447 Neoplasm of uncertain behavior of aortic body and other paraganglia: Secondary | ICD-10-CM | POA: Insufficient documentation

## 2021-05-26 MED ORDER — CARVEDILOL 6.25 MG PO TABS
6.2500 mg | ORAL_TABLET | Freq: Two times a day (BID) | ORAL | 1 refills | Status: DC
Start: 2021-05-26 — End: 2021-06-23

## 2021-05-26 MED ORDER — GADOBUTROL 1 MMOL/ML IV SOLN
10.0000 mL | Freq: Once | INTRAVENOUS | Status: AC | PRN
  Administered 2021-05-26: 10 mL via INTRAVENOUS

## 2021-05-29 ENCOUNTER — Other Ambulatory Visit: Payer: Self-pay

## 2021-05-29 ENCOUNTER — Ambulatory Visit (HOSPITAL_COMMUNITY)
Admission: RE | Admit: 2021-05-29 | Discharge: 2021-05-29 | Disposition: A | Discharge status: Home / Self Care | Payer: BC Managed Care – PPO | Source: Ambulatory Visit | Attending: Internal Medicine | Admitting: Internal Medicine

## 2021-05-29 ENCOUNTER — Inpatient Hospital Stay: Payer: BC Managed Care – PPO | Attending: Internal Medicine

## 2021-05-29 ENCOUNTER — Encounter: Payer: Self-pay | Admitting: Internal Medicine

## 2021-05-29 DIAGNOSIS — R519 Headache, unspecified: Secondary | ICD-10-CM | POA: Insufficient documentation

## 2021-05-29 DIAGNOSIS — R0602 Shortness of breath: Secondary | ICD-10-CM | POA: Diagnosis present

## 2021-05-29 DIAGNOSIS — D447 Neoplasm of uncertain behavior of aortic body and other paraganglia: Secondary | ICD-10-CM | POA: Insufficient documentation

## 2021-05-29 DIAGNOSIS — R61 Generalized hyperhidrosis: Secondary | ICD-10-CM | POA: Insufficient documentation

## 2021-05-29 DIAGNOSIS — R635 Abnormal weight gain: Secondary | ICD-10-CM | POA: Insufficient documentation

## 2021-05-29 DIAGNOSIS — G4700 Insomnia, unspecified: Secondary | ICD-10-CM | POA: Insufficient documentation

## 2021-05-29 DIAGNOSIS — Z635 Disruption of family by separation and divorce: Secondary | ICD-10-CM | POA: Insufficient documentation

## 2021-05-29 LAB — ECHOCARDIOGRAM COMPLETE
Area-P 1/2: 3.13 cm2
S' Lateral: 2.2 cm

## 2021-05-29 NOTE — Progress Notes (Signed)
*  PRELIMINARY RESULTS* Echocardiogram 2D Echocardiogram has been performed.  Samuel Germany 05/29/2021, 1:50 PM

## 2021-06-05 ENCOUNTER — Inpatient Hospital Stay: Payer: BC Managed Care – PPO

## 2021-06-05 ENCOUNTER — Other Ambulatory Visit: Payer: Self-pay

## 2021-06-05 DIAGNOSIS — D447 Neoplasm of uncertain behavior of aortic body and other paraganglia: Secondary | ICD-10-CM | POA: Diagnosis not present

## 2021-06-05 DIAGNOSIS — G4700 Insomnia, unspecified: Secondary | ICD-10-CM | POA: Diagnosis not present

## 2021-06-05 DIAGNOSIS — R635 Abnormal weight gain: Secondary | ICD-10-CM | POA: Diagnosis not present

## 2021-06-05 DIAGNOSIS — R519 Headache, unspecified: Secondary | ICD-10-CM | POA: Diagnosis not present

## 2021-06-05 DIAGNOSIS — R61 Generalized hyperhidrosis: Secondary | ICD-10-CM | POA: Diagnosis not present

## 2021-06-05 DIAGNOSIS — Z635 Disruption of family by separation and divorce: Secondary | ICD-10-CM | POA: Diagnosis not present

## 2021-06-07 ENCOUNTER — Encounter: Payer: Self-pay | Admitting: Internal Medicine

## 2021-06-07 LAB — IRON,TIBC AND FERRITIN PANEL
%SAT: 20
Iron: 72
TIBC: 363
UIBC: 291

## 2021-06-07 LAB — CATECHOLAMINES, FRACTIONATED, PLASMA
Dopamine: <30 pg/mL (ref 0–48)
Epinephrine: 61 pg/mL (ref 0–62)
Norepinephrine: 328 pg/mL (ref 0–874)

## 2021-06-07 LAB — CBC AND DIFFERENTIAL
HCT: 39 (ref 36–46)
Hemoglobin: 12.6 (ref 12.0–16.0)
WBC: 5.6

## 2021-06-07 LAB — HEMOGLOBIN A1C: Hemoglobin A1C: 5.7

## 2021-06-07 LAB — BASIC METABOLIC PANEL
BUN: 9 (ref 4–21)
CO2: 26 — AB (ref 13–22)
Chloride: 102 (ref 99–108)
Creatinine: 1 (ref 0.5–1.1)
Glucose: 94
Potassium: 4.6 (ref 3.4–5.3)
Sodium: 140 (ref 137–147)

## 2021-06-07 LAB — LIPID PANEL
Cholesterol: 215 — AB (ref 0–200)
HDL: 46 (ref 35–70)
LDL Cholesterol: 149
Triglycerides: 110 (ref 40–160)

## 2021-06-07 LAB — COMPREHENSIVE METABOLIC PANEL: Calcium: 9.5 (ref 8.7–10.7)

## 2021-06-07 LAB — CBC: RBC: 5.06 (ref 3.87–5.11)

## 2021-06-07 LAB — TSH: TSH: 1.19 (ref 0.41–5.90)

## 2021-06-07 LAB — VITAMIN D 25 HYDROXY (VIT D DEFICIENCY, FRACTURES): Vit D, 25-Hydroxy: 38.7

## 2021-06-08 ENCOUNTER — Encounter: Payer: Self-pay | Admitting: Internal Medicine

## 2021-06-09 ENCOUNTER — Encounter: Payer: Self-pay | Admitting: Internal Medicine

## 2021-06-10 LAB — METANEPHRINES, PLASMA
Metanephrine, Free: 66.2 pg/mL (ref 0.0–88.0)
Normetanephrine, Free: 132.4 pg/mL (ref 0.0–210.1)

## 2021-06-12 ENCOUNTER — Encounter
Payer: BC Managed Care – PPO | Attending: Physical Medicine and Rehabilitation | Admitting: Physical Medicine and Rehabilitation

## 2021-06-12 ENCOUNTER — Encounter: Payer: Self-pay | Admitting: Physical Medicine and Rehabilitation

## 2021-06-12 ENCOUNTER — Other Ambulatory Visit: Payer: Self-pay

## 2021-06-12 ENCOUNTER — Encounter: Payer: Self-pay | Admitting: Internal Medicine

## 2021-06-12 VITALS — BP 129/88 | HR 96 | Temp 99.1°F | Ht 62.0 in | Wt 259.2 lb

## 2021-06-12 DIAGNOSIS — M7918 Myalgia, other site: Secondary | ICD-10-CM | POA: Diagnosis present

## 2021-06-12 DIAGNOSIS — R61 Generalized hyperhidrosis: Secondary | ICD-10-CM | POA: Insufficient documentation

## 2021-06-12 DIAGNOSIS — C719 Malignant neoplasm of brain, unspecified: Secondary | ICD-10-CM | POA: Insufficient documentation

## 2021-06-12 NOTE — Progress Notes (Signed)
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 As well as trigger point injections.   Muscle tight   Real depression set in- talked to PCP and Med mgmt/[facility] something- psychiatry? Increased her Cymbalta to 90 mg daily-  Hasn't made a big difference- but just made change last Friday.   HR down to 96 today- BP down to 129/88- BP still up and down- HA's and sweating.  Due to see Dr Mickeal Skinner Monday- to determine next thing to do.  Has associated HA's and sweating- consistent pain- and then can ease off- pops off when feels fine-  Was told endo labs were normal per Dr Mickeal Skinner.  Feels like no one is helping and has to go back to school, but cannot return with her HA's.   Her quality of life is really bad, due to HA's and pain.  Sweating profusely.  Was tested for early menopause and is still ovulating.     Plan: Endo- will place referral to help figure out the urine/blood chemistry- And determine cause of sweating and HA's- with labile BP and heart rate- sounds like autonomic dysfunction, however she doesn't have lesion >T6, so cannot be autonomic dysreflexia.   2.  Patient here for trigger point injections for  Consent done and on chart.  Cleaned areas with alcohol and injected using a 27 gauge 1.5 inch needle  Injected 8cc Using 1% Lidocaine with no EPI  Upper traps B/L 2x on R Levators B/L  Posterior scalenes B/L  Middle scalenes Splenius Capitus  Pectoralis Major B/L  Rhomboids B/L x2 Infraspinatus Teres Major/minor Thoracic paraspinals Lumbar paraspinals B/L x3 Other injections-    Patient's level of pain prior was 8/10 Current level of pain after injections is 5/10  There was no bleeding or complications.  Patient was advised to drink a lot  of water on day after injections to flush system Will have increased soreness for 12-48 hours after injections.  Can use Lidocaine patches the day AFTER injections Can use theracane on day of injections in places didn't inject Can use heating pad 4-6 hours AFTER injections  3. Can do deep tissue massages- - myofascial release- or medical massage- no swedish massages.   4. Wait on weight loss surgery- since will complicate figuring out HA's and sweating.   5. F/U in 2 months if possible- for trigger point injections.   6. Use theracane and tennis balls- at least 3x/week- not today!  7. HA diary- to figure out patterns

## 2021-06-12 NOTE — Patient Instructions (Addendum)
Plan: Endo- will place referral to help figure out the urine/blood chemistry- And determine cause of sweating and HA's- with labile BP and heart rate- sounds like autonomic dysfunction, however she doesn't have lesion >T6, so cannot be autonomic dysreflexia.   2.  Patient here for trigger point injections for  Consent done and on chart.  Cleaned areas with alcohol and injected using a 27 gauge 1.5 inch needle  Injected 8cc Using 1% Lidocaine with no EPI  Upper traps B/L 2x on R Levators B/L  Posterior scalenes B/L  Middle scalenes Splenius Capitus  Pectoralis Major B/L  Rhomboids B/L x2 Infraspinatus Teres Major/minor Thoracic paraspinals Lumbar paraspinals B/L x3 Other injections-    Patient's level of pain prior was 8/10 Current level of pain after injections is 5/10  There was no bleeding or complications.  Patient was advised to drink a lot of water on day after injections to flush system Will have increased soreness for 12-48 hours after injections.  Can use Lidocaine patches the day AFTER injections Can use theracane on day of injections in places didn't inject Can use heating pad 4-6 hours AFTER injections  3. Can do deep tissue massages- - myofascial release- or medical massage- no swedish massages.   4. Wait on weight loss surgery- since will complicate figuring out HA's and sweating.   5. F/U in 2 months if possible- for trigger point injections.   6. Use theracane and tennis balls/foam roller- 3x/week minimum.

## 2021-06-19 ENCOUNTER — Other Ambulatory Visit: Payer: Self-pay

## 2021-06-19 ENCOUNTER — Inpatient Hospital Stay (HOSPITAL_BASED_OUTPATIENT_CLINIC_OR_DEPARTMENT_OTHER): Payer: BC Managed Care – PPO | Admitting: Internal Medicine

## 2021-06-19 ENCOUNTER — Other Ambulatory Visit: Payer: Self-pay | Admitting: Internal Medicine

## 2021-06-19 VITALS — BP 136/107 | HR 95 | Temp 98.5°F | Resp 18 | Ht 62.0 in | Wt 255.3 lb

## 2021-06-19 DIAGNOSIS — D447 Neoplasm of uncertain behavior of aortic body and other paraganglia: Secondary | ICD-10-CM | POA: Diagnosis not present

## 2021-06-19 NOTE — Progress Notes (Signed)
[facility] [place], [place] [address] [phone]   Interval Evaluation  Date of Service: 06/19/21 Patient Name: Shannon [name] Patient MRN: [id number] Patient DOB: 1981-06-12 Provider: Ventura Sellers, MD  Identifying Statement:  Shannon [name] is a 40 y.o. female with  cauda equina   paraganglioma    Oncologic History 03/17/21: Laminectomy, resection by Dr. Saintclair Halsted.  Path demonstrates paraganglioma  Interval History: Shannon [name] presents today after further workup, staging, repeat MRI spine.  She complains of headaches, which are more frequency, now daily.  Also describes insomnia, increased sweating, weight gain.  She is dosing tylenol up 3x per day, every day for the past 3 months.  In addition, sleep quality is poor even with [place], and she is tired throughout most of the day.  Currently going through a divorce and has very high levels of stress from that.  Working with physical medicine for back pain and spasms.  H+P (04/13/21) Patient presented to medical attention in May 2022 with several months history of severe, refractory lower back pain.  Initially treated for sciatica, she eventually obtained an MRI which demonstrated a tumor within the roots of the cauda equina.  She also has complained of headaches, excess sweating, palpitations at times.  She underwent laminectomy and resection of the lesion with Dr. Saintclair Halsted on 5/27, path demonstrated paraganglioma.  Since then her pain is significantly improved.  There was some weakness after surgery but it improved with short rehab stint.  Over past month, no new or progressive deficits.  Medications: Current Outpatient Medications on File Prior to Visit  Medication Sig Dispense Refill   acetaminophen (TYLENOL) 325 MG tablet Take 1-2 tablets (325-650 mg total) by mouth every 4 (four) hours as needed for mild pain.     albuterol (PROVENTIL HFA;VENTOLIN HFA) 108 (90 Base) MCG/ACT inhaler  Inhale 2 puffs into the lungs every 6 (six) hours as needed for wheezing or shortness of breath. 1 Inhaler 3   carvedilol (COREG) 6.25 MG tablet Take 1 tablet (6.25 mg total) by mouth 2 (two) times daily with a meal. 60 tablet 1   Cholecalciferol (VITAMIN D3) 1.25 MG (50000 UT) CAPS Take 1 capsule by mouth once a week.     DULoxetine (CYMBALTA) 60 MG capsule Take by mouth.     VYVANSE 70 MG capsule Take 70 mg by mouth every morning.     zolpidem (AMBIEN) 10 MG tablet Take 10 mg by mouth at bedtime as needed.     No current facility-administered medications on file prior to visit.    Allergies:  Allergies  Allergen Reactions   Ceftriaxone Hives and Rash    No SOB - patient willing to try again   Diclofenac Rash   Fluconazole Hives    Raw rash on abdomen and she had sores and swelling in mouth  Other reaction(s): Angioedema Raw rash on abdomen and she had sores and swelling in mouth    Pineapple Shortness Of Breath   Latex Rash   Bactrim [Sulfamethoxazole-Trimethoprim] Hives   Ceftriaxone Sodium Rash    No SOB - patient willing to try again   Past Medical History:  Past Medical History:  Diagnosis Date   ADD (attention deficit disorder)    Anemia    Anxiety    Asthma    Back pain    Bilateral swelling of feet    Chest pain    Constipation    Depression  Family history of breast cancer 04/28/2021   Family history of ovarian cancer 04/28/2021   Family history of pancreatic cancer 04/28/2021   Hyperlipidemia    Hypertension    Preeclampsia post delivery   Irregular heart beat    Joint pain    Multiple food allergies    Obesity    Palpitations in pediatric patient    PONV (postoperative nausea and vomiting)    PONV; Also emotional upon waking up   Shortness of breath    Vitamin D deficiency    Past Surgical History:  Past Surgical History:  Procedure Laterality Date   DILATATION & CURRETTAGE/HYSTEROSCOPY WITH RESECTOCOPE N/A 02/03/2014   Procedure: DILATATION &  CURETTAGE/HYSTEROSCOPY ;  Surgeon: Delice Lesch, MD;  Location: [facility];  Service: Gynecology;  Laterality: N/A;   DILATION AND CURETTAGE OF UTERUS     LAMINECTOMY N/A 03/17/2021   Procedure: Laminectomy - Lumbar Four-Lumbar Five for Intradural mass;  Surgeon: Kary Kos, MD;  Location: [facility];  Service: Neurosurgery;  Laterality: N/A;  3C   MYOMECTOMY N/A 02/03/2014   Procedure: MYOMECTOMY ;  Surgeon: Delice Lesch, MD;  Location: [facility];  Service: Gynecology;  Laterality: N/A;   SALPINGECTOMY     TONSILLECTOMY     tonsils and adnoids removed     TUBAL LIGATION N/A 08/19/2015   Procedure: POST PARTUM TUBAL LIGATION;  Surgeon: Everett Graff, MD;  Location: [facility];  Service: Gynecology;  Laterality: N/A;   WISDOM TOOTH EXTRACTION     Social History:  Social History   Socioeconomic History   Marital status: Married    Spouse name: Not on file   Number of children: Not on file   Years of education: Not on file   Highest education level: Not on file  Occupational History   Not on file  Tobacco Use   Smoking status: Never   Smokeless tobacco: Never  Vaping Use   Vaping Use: Never used  Substance and Sexual Activity   Alcohol use: No    Comment: occasionally   Drug use: No   Sexual activity: Yes    Birth control/protection: None  Other Topics Concern   Not on file  Social History Narrative   Not on file   Social Determinants of Health   Financial Resource Strain: Not on file  Food Insecurity: Not on file  Transportation Needs: Not on file  Physical Activity: Not on file  Stress: Not on file  Social Connections: Not on file  Intimate Partner Violence: Not on file   Family History:  Family History  Problem Relation Age of Onset   Hypertension Mother    Diabetes Mother    Mental illness Mother    Hyperlipidemia Mother    Stroke Mother    Depression Mother    Anxiety disorder Mother    Bipolar disorder Mother    Liver disease Mother    Sleep apnea Mother     Obesity Mother    Hypertension Father    Asthma Brother    Seizures Brother    Pancreatic cancer Maternal Aunt 19   Lung cancer Maternal Aunt        dx after 88; smoking hx   Liver cancer Maternal Uncle        dx after 56   Breast cancer Paternal Aunt        two paternal aunts, age 49s-40s   Ovarian cancer Paternal Aunt        two paternal aunts, reported  BRCA mutation   Colon polyps Paternal Uncle        two paternal uncles; more than 72 lifetime polyps   Heart disease Maternal Grandmother    Hypertension Maternal Grandmother    Breast cancer Maternal Grandmother        dx 34s-40s; d. 23   Ovarian cancer Paternal Grandmother    Ovarian cancer Other        PGM's mother   Breast cancer Cousin        dx 45s   Ovarian cancer Cousin        dx 48s    Review of Systems: Constitutional: Doesn't report fevers, chills or abnormal weight loss Eyes: Doesn't report blurriness of vision Ears, nose, mouth, throat, and face: Doesn't report sore throat Respiratory: Doesn't report cough, dyspnea or wheezes Cardiovascular: Doesn't report palpitation, chest discomfort  Gastrointestinal:  Doesn't report nausea, constipation, diarrhea GU: Doesn't report incontinence Skin: Doesn't report skin rashes Neurological: Per HPI Musculoskeletal: Doesn't report joint pain Behavioral/Psych: Doesn't report anxiety  Physical Exam: Vitals:   06/19/21 1156  BP: (!) 136/107  Pulse: 95  Resp: 18  Temp: 98.5 F (36.9 C)  SpO2: 99%   KPS: 80. General: Alert, cooperative, pleasant, in no acute distress Head: Normal EENT: No conjunctival injection or scleral icterus.  Lungs: Resp effort normal Cardiac: Regular rate Abdomen: Non-distended abdomen Skin: No rashes cyanosis or petechiae. Extremities: No clubbing or edema  Neurologic Exam: Mental Status: Awake, alert, attentive to examiner. Oriented to self and environment. Language is fluent with intact comprehension.  Cranial Nerves: Visual acuity  is grossly normal. Visual fields are full. Extra-ocular movements intact. No ptosis. Face is symmetric Motor: Tone and bulk are normal. Power is full in both arms and legs. Reflexes are diminished, no pathologic reflexes present.  Sensory: Intact to light touch Gait: Normal.   Labs: I have reviewed the data as listed    Component Value Date/Time   NA 138 04/03/2021 0642   NA 140 08/08/2020 1653   K 4.1 04/03/2021 0642   CL 103 04/03/2021 0642   CO2 30 04/03/2021 0642   GLUCOSE 90 04/03/2021 0642   BUN 14 04/03/2021 0642   BUN 9 08/08/2020 1653   CREATININE 1.05 (H) 04/03/2021 0642   CREATININE 0.77 11/20/2012 1535   CALCIUM 9.0 04/03/2021 0642   PROT 5.8 (L) 03/25/2021 0505   PROT 6.9 08/08/2020 1653   ALBUMIN 2.7 (L) 03/25/2021 0505   ALBUMIN 4.2 08/08/2020 1653   AST 14 (L) 03/25/2021 0505   ALT 23 03/25/2021 0505   ALKPHOS 85 03/25/2021 0505   BILITOT <0.1 (L) 03/25/2021 0505   BILITOT 0.2 08/08/2020 1653   GFRNONAA >60 04/03/2021 0642   GFRAA 92 08/08/2020 1653   Lab Results  Component Value Date   WBC 5.1 04/18/2021   NEUTROABS 2.4 04/18/2021   HGB 12.7 04/18/2021   HCT 40.3 04/18/2021   MCV 81 04/18/2021   PLT 220 04/18/2021    Imaging:  [facility] Clinician Interpretation: I have personally reviewed the CNS images as listed.  My interpretation, in the context of the patient's clinical presentation, is stable disease  MR LUMBAR SPINE W WO CONTRAST  Result Date: 05/29/2021 CLINICAL DATA:  Brain/CNS neoplasm, surveillance; paraganglioma post removal EXAM: MRI LUMBAR SPINE WITHOUT AND WITH CONTRAST TECHNIQUE: Multiplanar and multiecho pulse sequences of the lumbar spine were obtained without and with intravenous contrast. CONTRAST:  61m GADAVIST GADOBUTROL 1 MMOL/ML IV SOLN COMPARISON:  02/27/2021 FINDINGS: Segmentation:  Standard. Alignment:  Stable. Vertebrae: Postoperative changes of laminectomies at L4-L5. Stable vertebral body heights. No marrow edema. No suspicious  osseous lesion. Conus medullaris and cauda equina: Conus extends to the L1 level. Interval resection of intradural, extramedullary mass at the L4-L5 level. No evidence of residual tumor. Paraspinal and other soft tissues: New postoperative changes including small fluid collection within the laminectomy bed. This mildly flattens the dorsal thecal sac. Disc levels: Intervertebral disc heights and signal are maintained. IMPRESSION: Post gross total resection of intradural mass at L4-L5. Small fluid collection within laminectomy bed mildly flattens the dorsal thecal sac without significant stenosis. Electronically Signed   By: Macy Mis M.D.   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 EXAM: NUCLEAR MEDICINE PET SKULL BASE TO THIGH TECHNIQUE: 13.3 mCi F-18 FDG was injected intravenously. Full-ring PET imaging was performed from the skull base to thigh after the radiotracer. CT data was obtained and used for attenuation correction and anatomic localization. Fasting blood glucose: 94 mg/dl COMPARISON:  MR lumbar spine 02/27/2021, CT abdomen pelvis 12/23/2013 and CT chest 04/12/2013. FINDINGS: Mediastinal blood pool activity: SUV max 3.0 Liver activity: SUV max NA NECK: Oropharyngeal activity is likely physiologic. No hypermetabolic lymph nodes. Incidental CT findings: None. CHEST: No hypermetabolic mediastinal, hilar or axillary lymph nodes. No hypermetabolic pulmonary nodules. Incidental CT findings: Atherosclerotic calcification of the aorta. Heart is at the upper limits of normal in size to mildly enlarged. No pericardial or pleural effusion. Lungs are grossly clear given respiratory motion. ABDOMEN/PELVIS: No abnormal hypermetabolism in the liver, adrenal glands, spleen or pancreas. No hypermetabolic lymph nodes. Incidental CT findings: Liver, gallbladder and adrenal glands are unremarkable.  Punctate bilateral renal stones. Spleen, pancreas, stomach and bowel are grossly unremarkable. SKELETON: Postoperative uptake in the paraspinal soft tissues posterior 2 the lower lumbar spine. Otherwise, no abnormal osseous hypermetabolism. Incidental CT findings: L5 laminectomies. IMPRESSION: 1. No evidence of metastatic disease. 2. Bilateral renal stones. 3.  Aortic atherosclerosis (ICD10-I70.0). Electronically Signed   By: Lorin Picket M.D.   On: 05/22/2021 13:44   ECHOCARDIOGRAM COMPLETE  Result Date: 05/29/2021    ECHOCARDIOGRAM REPORT   Patient Name:   SHYTERIA LEWIS Date of Exam: 05/29/2021 Medical Rec #:  [id number]    Height:       65.0 in Accession #:    [id number]   Weight:       263.0 lb Date of Birth:  Feb 28, 1981    BSA:          2.222 m Patient Age:    63 years     BP:           133/96 mmHg Patient Gender: F            HR:           102 bpm. Exam Location:  [facility] Procedure: 2D Echo, Cardiac Doppler and Color Doppler Indications:    R06.02 (ICD-10-CM) - Shortness of breath  History:        Patient has no prior history of Echocardiogram examinations.                 Risk Factors:Hypertension and Dyslipidemia. Class 2 obesity,                 Tachycardia.  Sonographer:    Alvino Chapel RCS Referring Phys: [id number] ROBYN SANDERS IMPRESSIONS  1. Left ventricular ejection fraction, by estimation,  is 60 to 65%. The left ventricle has normal function. The left ventricle has no regional wall motion abnormalities. Left ventricular diastolic parameters were normal.  2. Right ventricular systolic function is normal. The right ventricular size is normal.  3. The mitral valve is normal in structure. No evidence of mitral valve regurgitation. No evidence of mitral stenosis.  4. The aortic valve is normal in structure. Aortic valve regurgitation is not visualized. No aortic stenosis is present.  5. The inferior vena cava is normal in size with greater than 50% respiratory variability, suggesting right atrial  pressure of 3 mmHg. FINDINGS  Left Ventricle: Left ventricular ejection fraction, by estimation, is 60 to 65%. The left ventricle has normal function. The left ventricle has no regional wall motion abnormalities. The left ventricular internal cavity size was normal in size. There is  no left ventricular hypertrophy. Left ventricular diastolic parameters were normal. Right Ventricle: The right ventricular size is normal. No increase in right ventricular wall thickness. Right ventricular systolic function is normal. Left Atrium: Left atrial size was normal in size. Right Atrium: Right atrial size was normal in size. Pericardium: There is no evidence of pericardial effusion. Mitral Valve: The mitral valve is normal in structure. No evidence of mitral valve regurgitation. No evidence of mitral valve stenosis. Tricuspid Valve: The tricuspid valve is normal in structure. Tricuspid valve regurgitation is not demonstrated. No evidence of tricuspid stenosis. Aortic Valve: The aortic valve is normal in structure. Aortic valve regurgitation is not visualized. No aortic stenosis is present. Pulmonic Valve: The pulmonic valve was normal in structure. Pulmonic valve regurgitation is not visualized. No evidence of pulmonic stenosis. Aorta: The aortic root is normal in size and structure. Venous: The inferior vena cava is normal in size with greater than 50% respiratory variability, suggesting right atrial pressure of 3 mmHg. IAS/Shunts: No atrial level shunt detected by color flow Doppler.  LEFT VENTRICLE PLAX 2D LVIDd:         3.50 cm  Diastology LVIDs:         2.20 cm  LV e' medial:    9.57 cm/s LV PW:         1.10 cm  LV E/e' medial:  8.2 LV IVS:        1.10 cm  LV e' lateral:   10.40 cm/s LVOT diam:     1.90 cm  LV E/e' lateral: 7.6 LV SV:         50 LV SV Index:   23 LVOT Area:     2.84 cm  RIGHT VENTRICLE RV S prime:     11.50 cm/s TAPSE (M-mode): 2.0 cm LEFT ATRIUM             Index       RIGHT ATRIUM           Index LA  diam:        3.60 cm 1.62 cm/m  RA Area:     13.50 cm LA Vol (A2C):   21.6 ml 9.72 ml/m  RA Volume:   29.10 ml  13.09 ml/m LA Vol (A4C):   32.8 ml 14.76 ml/m LA Biplane Vol: 27.1 ml 12.19 ml/m  AORTIC VALVE LVOT Vmax:   101.00 cm/s LVOT Vmean:  74.000 cm/s LVOT VTI:    0.178 m  AORTA Ao Root diam: 2.90 cm MITRAL VALVE MV Area (PHT): 3.13 cm    SHUNTS MV Decel Time: 242 msec    Systemic VTI:  0.18 m MV E  velocity: 78.80 cm/s  Systemic Diam: 1.90 cm MV A velocity: 89.10 cm/s MV E/A ratio:  0.88 Jenkins Rouge MD Electronically signed by Jenkins Rouge MD Signature Date/Time: 05/29/2021/2:43:37 PM    Final     Assessment/Plan No diagnosis found.  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  She is clinically and radiographically stable today.  Workup, staging did not demonstrate and plasma/urine metanephrines, no other site of disease.  Repeat L-spine did not demonstrate any disease recurrence.  For symptom burden, headaches, we recommended the following: -Gradual weaning of anaglesia, keeping calendar -Attention to sleep disorder by following through with previously ordered sleep study -Change coreg to propranolol 7m BID for headache prevention, autonomic regulation -Will con't to work with mental health issues, stressors  We appreciate the opportunity to participate in the care of Daizy Dang.   We will touch base with her via phone in 3-4 weeks to gauge response to these interventions.  All questions were answered. The patient knows to call the clinic with any problems, questions or concerns. No barriers to learning were detected.  The total time spent in the encounter was 40 minutes and more than 50% was on counseling and review of test results   ZVentura Sellers MD Medical Director of Neuro-Oncology [facility]08/29/22 11:54 AM

## 2021-06-19 NOTE — Telephone Encounter (Signed)
I don't know of anyone in [facility] [place]. I will have to see.

## 2021-06-20 ENCOUNTER — Other Ambulatory Visit: Payer: Self-pay | Admitting: Internal Medicine

## 2021-06-20 DIAGNOSIS — R000 Tachycardia, unspecified: Secondary | ICD-10-CM

## 2021-06-20 DIAGNOSIS — R0683 Snoring: Secondary | ICD-10-CM

## 2021-06-22 ENCOUNTER — Other Ambulatory Visit: Payer: Self-pay | Admitting: Internal Medicine

## 2021-06-22 DIAGNOSIS — R0683 Snoring: Secondary | ICD-10-CM

## 2021-06-23 ENCOUNTER — Other Ambulatory Visit: Payer: Self-pay | Admitting: Internal Medicine

## 2021-06-23 ENCOUNTER — Encounter: Payer: Self-pay | Admitting: Internal Medicine

## 2021-06-23 MED ORDER — PROPRANOLOL HCL 20 MG PO TABS: 20.0000 mg | ORAL_TABLET | Freq: Two times a day (BID) | ORAL | 3 refills | Status: DC

## 2021-06-27 ENCOUNTER — Telehealth: Payer: Self-pay | Admitting: *Deleted

## 2021-06-27 ENCOUNTER — Ambulatory Visit (HOSPITAL_BASED_OUTPATIENT_CLINIC_OR_DEPARTMENT_OTHER): Payer: BC Managed Care – PPO | Attending: Internal Medicine | Admitting: Internal Medicine

## 2021-06-27 DIAGNOSIS — R0683 Snoring: Secondary | ICD-10-CM | POA: Insufficient documentation

## 2021-06-27 DIAGNOSIS — G4700 Insomnia, unspecified: Secondary | ICD-10-CM | POA: Diagnosis not present

## 2021-06-27 NOTE — Telephone Encounter (Signed)
Pt has been scheduled as NEW PT for PRE OP CLEARANCE with Dr. Domenic Polite in the [place] office 06/29/21. I will fax records over to the [place] office for NEW PT APPT. Will send FYI to surgeon office pt has appt.

## 2021-06-27 NOTE — Telephone Encounter (Signed)
Our office received office notes and a referral from Dr. Vonita Moss [place] office for pre op clearance. I left a message for the pt to please call the office [phone] and let the operator know that she is calling to schedule a NEW PT APPT with the first available provider. Pt can be seen at any one of our office locations that may suit her best. Once the pt has been scheduled I will send the notes to the provider. There is no official clearance in the notes received today, though surgeon does somewhat outline his plan. Surgery plans are for a Sleeve Gastrectomy. Will await pt call back for the appt.

## 2021-06-28 ENCOUNTER — Encounter: Payer: Self-pay | Admitting: Internal Medicine

## 2021-06-29 ENCOUNTER — Encounter: Payer: Self-pay | Admitting: *Deleted

## 2021-06-29 ENCOUNTER — Encounter: Payer: Self-pay | Admitting: Cardiology

## 2021-06-29 ENCOUNTER — Other Ambulatory Visit: Payer: Self-pay

## 2021-06-29 ENCOUNTER — Ambulatory Visit (INDEPENDENT_AMBULATORY_CARE_PROVIDER_SITE_OTHER): Payer: BC Managed Care – PPO | Admitting: Cardiology

## 2021-06-29 ENCOUNTER — Telehealth: Payer: Self-pay | Admitting: *Deleted

## 2021-06-29 VITALS — BP 134/90 | HR 89 | Ht 64.0 in | Wt 254.0 lb

## 2021-06-29 DIAGNOSIS — Z01810 Encounter for preprocedural cardiovascular examination: Secondary | ICD-10-CM | POA: Diagnosis not present

## 2021-06-29 NOTE — Telephone Encounter (Signed)
Letter completed for patient

## 2021-06-29 NOTE — Progress Notes (Signed)
Cardiology Office Note  Date: 06/29/2021   ID: Chihiro Ganzer, DOB 1981-04-22, MRN [id number]  PCP:  Glendale Chard, MD  Cardiologist:  Rozann Lesches, MD Electrophysiologist:  None   Chief Complaint  Patient presents with   Preoperative cardiac evaluation     History of Present Illness: Shannon [name] is a 40 y.o. female referred for cardiology consultation by Dr. Mikey College for preoperative cardiac evaluation prior to anticipated sleeve gastrectomy.  She presents stating that she has been doing reasonably well of late.  She underwent spine surgery with Dr. Saintclair Halsted back in May with resection of a paraganglioma and has had follow-up for subsequent symptoms since that time, doing better on beta-blocker.  She states that she has been walking 15 minutes at a time twice a day, at least 2-3 times a week to try to build herself up.  She lives in a third floor apartment and walks up 3 flights of steps regularly.  With this level of activity she does not describe any angina and has NYHA class II dyspnea.  No palpitations or syncope.  She had a recent echocardiogram obtained by PCP demonstrating LVEF 60 to 65% with normal diastolic parameters and no significant valvular abnormalities.  I reviewed her medications which are noted below.  I personally reviewed her ECG today which shows normal sinus rhythm. RCRI cardiac risk calculation indicates class II, 0.9% chance of major adverse cardiac event.   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   Vitamin D deficiency     Past Surgical History:  Procedure Laterality Date   BACK  SURGERY     DILATATION & CURRETTAGE/HYSTEROSCOPY WITH RESECTOCOPE N/A 02/03/2014   Procedure: DILATATION & CURETTAGE/HYSTEROSCOPY ;  Surgeon: Delice Lesch, MD;  Location: [facility];  Service: Gynecology;  Laterality: N/A;   DILATION AND CURETTAGE OF UTERUS     LAMINECTOMY N/A 03/17/2021   Procedure: Laminectomy - Lumbar Four-Lumbar Five for Intradural mass;  Surgeon: Kary Kos, MD;  Location: [facility];  Service: Neurosurgery;  Laterality: N/A;  3C   MYOMECTOMY N/A 02/03/2014   Procedure: MYOMECTOMY ;  Surgeon: Delice Lesch, MD;  Location: [facility];  Service: Gynecology;  Laterality: N/A;   SALPINGECTOMY     TONSILLECTOMY     tonsils and adnoids removed     TUBAL LIGATION N/A 08/19/2015   Procedure: POST PARTUM TUBAL LIGATION;  Surgeon: Everett Graff, MD;  Location: [facility];  Service: Gynecology;  Laterality: N/A;   WISDOM TOOTH EXTRACTION      Current Outpatient Medications  Medication Sig Dispense Refill   acetaminophen (TYLENOL) 325 MG tablet Take 1-2 tablets (325-650 mg total) by mouth every 4 (four) hours as needed for mild pain.     albuterol (PROVENTIL HFA;VENTOLIN HFA) 108 (90 Base) MCG/ACT inhaler Inhale 2 puffs into the lungs every 6 (six) hours as needed for wheezing or shortness of breath. 1 Inhaler 3   Cholecalciferol (VITAMIN D3) 1.25 MG (50000 UT) CAPS Take 1 capsule by mouth once a week.  DULoxetine (CYMBALTA) 60 MG capsule Take 90 mg by mouth.     propranolol (INDERAL) 20 MG tablet Take 1 tablet (20 mg total) by mouth 2 (two) times daily. 60 tablet 3   VYVANSE 70 MG capsule Take 70 mg by mouth every morning.     zolpidem (AMBIEN) 10 MG tablet Take 10 mg by mouth at bedtime as needed.     No current facility-administered medications for this visit.   Allergies:  Ceftriaxone, Diclofenac, Fluconazole, Pineapple, Latex, Bactrim [sulfamethoxazole-trimethoprim], and Ceftriaxone sodium   Social History: The patient  reports that she has never smoked. She has never used  smokeless tobacco. She reports that she does not drink alcohol and does not use drugs.   Family History: The patient's family history includes Anxiety disorder in her mother; Asthma in her brother; Bipolar disorder in her mother; Breast cancer in her cousin, maternal grandmother, and paternal aunt; Colon polyps in her paternal uncle; Depression in her mother; Diabetes in her mother; Heart disease in her maternal grandmother; Hyperlipidemia in her mother; Hypertension in her father, maternal grandmother, and mother; Liver cancer in her maternal uncle; Liver disease in her mother; Lung cancer in her maternal aunt; Mental illness in her mother; Obesity in her mother; Ovarian cancer in her cousin, paternal aunt, paternal grandmother, and another family member; Pancreatic cancer (age of onset: 71) in her maternal aunt; Seizures in her brother; Sleep apnea in her mother; Stroke in her mother.   ROS: No orthopnea or PND.  Physical Exam: VS:  BP 134/90   Pulse 89   Ht '5\' 4"'$  (1.626 m)   Wt 254 lb (115.2 kg)   SpO2 94%   BMI 43.60 kg/m , BMI Body mass index is 43.6 kg/m.  Wt Readings from Last 3 Encounters:  06/29/21 254 lb (115.2 kg)  06/27/21 254 lb (115.2 kg)  06/19/21 255 lb 4.8 oz (115.8 kg)    General: Patient appears comfortable at rest. HEENT: Conjunctiva and lids normal, wearing a mask. Neck: Supple, no elevated JVP or carotid bruits, no thyromegaly. Lungs: Clear to auscultation, nonlabored breathing at rest. Cardiac: Regular rate and rhythm, no S3 or significant systolic murmur, no pericardial rub. Abdomen: Soft, bowel sounds present, no guarding or rebound. Extremities: No pitting edema, distal pulses 2+. Skin: Warm and dry. Musculoskeletal: No kyphosis. Neuropsychiatric: Alert and oriented x3, affect grossly appropriate.  ECG:  An ECG dated 03/14/2021 was personally reviewed today and demonstrated:  Sinus tachycardia.  Recent Labwork: 03/25/2021: ALT 23; AST 14 04/18/2021: BNP <2.5;  Magnesium 2.1; Platelets 220 06/07/2021: BUN 9; Creatinine 1.0; Hemoglobin 12.6; Potassium 4.6; Sodium 140; TSH 1.19     Component Value Date/Time   CHOL 215 (A) 06/07/2021 0000   CHOL 221 (H) 08/08/2020 1653   TRIG 110 06/07/2021 0000   HDL 46 06/07/2021 0000   HDL 68 08/08/2020 1653   CHOLHDL 3.3 08/08/2020 1653   CHOLHDL 2.9 11/20/2012 1535   VLDL 15 11/20/2012 1535   LDLCALC 149 06/07/2021 0000   LDLCALC 134 (H) 08/08/2020 1653    Other Studies Reviewed Today:  Echocardiogram 05/29/2021:  1. Left ventricular ejection fraction, by estimation, is 60 to 65%. The  left ventricle has normal function. The left ventricle has no regional  wall motion abnormalities. Left ventricular diastolic parameters were  normal.   2. Right ventricular systolic function is normal. The right ventricular  size is normal.   3. The mitral valve is normal in structure. No evidence of mitral valve  regurgitation.  No evidence of mitral stenosis.   4. The aortic valve is normal in structure. Aortic valve regurgitation is  not visualized. No aortic stenosis is present.   5. The inferior vena cava is normal in size with greater than 50%  respiratory variability, suggesting right atrial pressure of 3 mmHg.   Assessment and Plan:  1.  Preoperative cardiac evaluation in a 40 year old woman with a history of hyperlipidemia (LDL 149), prior preeclampsia with elevated blood pressure, and plan for elective sleeve gastrectomy for weight loss under general anesthesia.  She underwent spine surgery under general anesthesia back in May without perioperative cardiac event.  She describes activities exceeding 4 METS without angina or significant shortness of breath, RCRI cardiac risk calculation is class II, 0.9% chance of major adverse cardiac event.  Recent echocardiogram shows normal LVEF without valvular abnormalities.  ECG reviewed and stable.  No clear indication for further ischemic testing at this point, she should be  able to proceed with surgery as planned.  2.  History of lumbar paraganglioma status post resection, follow-up note with oncology reviewed, she is now on propranolol with decrease in heart rate  3.  At risk for OSA with sleep study pending.  Medication Adjustments/Labs and Tests Ordered: Current medicines are reviewed at length with the patient today.  Concerns regarding medicines are outlined above.   Tests Ordered: Orders Placed This Encounter  Procedures   EKG 12-Lead     Medication Changes: No orders of the defined types were placed in this encounter.   Disposition:  Follow up prn  Signed, Satira Sark, MD, [facility] 06/29/2021 11:08 AM    [facility]. [address] [place], [place] [address] Phone: [phone]; Fax: [phone]

## 2021-06-29 NOTE — Patient Instructions (Addendum)
Medication Instructions:   Your physician recommends that you continue on your current medications as directed. Please refer to the Current Medication list given to you today.  Labwork:  none  Testing/Procedures:  none  Follow-Up:  Your physician recommends that you schedule a follow-up appointment in: as needed.  Any Other Special Instructions Will Be Listed Below (If Applicable).  If you need a refill on your cardiac medications before your next appointment, please call your pharmacy. 

## 2021-07-02 DIAGNOSIS — R0683 Snoring: Secondary | ICD-10-CM | POA: Diagnosis not present

## 2021-07-02 NOTE — Procedures (Signed)
   Patient Name: Shannon [name], Shannon [name] Date: 06/27/2021 Gender: Female D.O.B: 1981-03-29 Age (years): 63 Referring Provider: Glendale Chard Height (inches): 40 Interpreting Physician: Baird Lyons MD, ABSM Weight (lbs): 254 RPSGT: Earney Hamburg BMI: 44 MRN: [id number] Neck Size: 15.50  CLINICAL INFORMATION Sleep Study Type: NPSG Indication for sleep study: Snoring Epworth Sleepiness Score: 6  SLEEP STUDY TECHNIQUE As per the AASM Manual for the Scoring of Sleep and Associated Events v2.3 (April 2016) with a hypopnea requiring 4% desaturations.  The channels recorded and monitored were frontal, central and occipital EEG, electrooculogram (EOG), submentalis EMG (chin), nasal and oral airflow, thoracic and abdominal wall motion, anterior tibialis EMG, snore microphone, electrocardiogram, and pulse oximetry.  MEDICATIONS Medications self-administered by patient taken the night of the study :  Zolpidem, inhaler (albuterol?)  SLEEP ARCHITECTURE The study was initiated at 10:03:57 PM and ended at 4:33:01 AM.  Sleep onset time was 67.4 minutes and the sleep efficiency was 52.4%%. The total sleep time was 203.7 minutes.  Stage REM latency was 162.0 minutes.  The patient spent 5.9%% of the night in stage N1 sleep, 84.5%% in stage N2 sleep, 0.0%% in stage N3 and 9.6% in REM.  Alpha intrusion was absent.  Supine sleep was 0.00%.  RESPIRATORY PARAMETERS The overall apnea/hypopnea index (AHI) was 4.7 per hour. There were 0 total apneas, including 0 obstructive, 0 central and 0 mixed apneas. There were 16 hypopneas and 8 RERAs.  The AHI during Stage REM sleep was 36.9 per hour.  AHI while supine was N/A per hour.  The mean oxygen saturation was 92.6%. The minimum SpO2 during sleep was 82.0%.  moderate snoring was noted during this study.  CARDIAC DATA The 2 lead EKG demonstrated sinus rhythm. The mean heart rate was 78.5 beats per minute. Other EKG findings include:  None.  LEG MOVEMENT DATA The total PLMS were 0 with a resulting PLMS index of 0.0. Associated arousal with leg movement index was 0.0 .  IMPRESSIONS - No significant obstructive sleep apnea occurred during this study (AHI = 4.7/h). - Mild oxygen desaturation was noted during this study (Min O2 = 82.0%). Mean O2 saturation 92.6%. - The patient snored with moderate snoring volume. - No cardiac abnormalities were noted during this study. - Clinically significant periodic limb movements did not occur during sleep. No significant associated arousals. - Described as sleepy on arrival , but then alert. Had dificulty maintaining sleep despite zolpidem.  Latency until sleep onset after lights-out 67 minutes. Total sleep time only 203 minutes (52 % of recording time).   DIAGNOSIS - Insomnia  RECOMMENDATIONS - Suggest managing as insomnia if clinically appropriate. - Sleep hygiene should be reviewed to assess factors that may improve sleep quality. - Weight management and regular exercise should be initiated or continued if appropriate.  [Electronically signed] 07/02/2021 11:28 AM  Baird Lyons MD, ABSM Diplomate, American Board of Sleep Medicine   NPI: [id number]                          [place], [place] [facility]  ELECTRONICALLY SIGNED ON:  07/02/2021, 11:20 AM [place] PH: [phone]   FX: [phone] [place]

## 2021-07-03 ENCOUNTER — Encounter: Payer: Self-pay | Admitting: *Deleted

## 2021-07-03 NOTE — Progress Notes (Signed)
[place] Work  Clinical Social Work was referred by Insurance underwriter for assessment of psychosocial needs.  Clinical Social Worker contacted patient by phone  to offer support and assess for needs.  CSW and patient discussed FMLA benefits and employee rights related to patient's current employer.  CSW encouraged patient to contact [facility] as a resource for Armed forces training and education officer.  CSW and patient briefly discussed financial assistance through [place]. Ms. Lope is going to send completed application to CSW.   Gwinda Maine, LCSW  Clinical Social Worker [facility]

## 2021-07-11 ENCOUNTER — Ambulatory Visit: Payer: Self-pay | Admitting: Surgery

## 2021-07-13 ENCOUNTER — Ambulatory Visit (INDEPENDENT_AMBULATORY_CARE_PROVIDER_SITE_OTHER): Payer: BC Managed Care – PPO | Admitting: Internal Medicine

## 2021-07-13 ENCOUNTER — Other Ambulatory Visit: Payer: Self-pay

## 2021-07-13 ENCOUNTER — Encounter: Payer: Self-pay | Admitting: Internal Medicine

## 2021-07-13 VITALS — BP 118/76 | HR 86 | Temp 98.8°F | Ht 64.8 in | Wt 252.6 lb

## 2021-07-13 DIAGNOSIS — F3289 Other specified depressive episodes: Secondary | ICD-10-CM

## 2021-07-13 DIAGNOSIS — D447 Neoplasm of uncertain behavior of aortic body and other paraganglia: Secondary | ICD-10-CM

## 2021-07-13 DIAGNOSIS — F5101 Primary insomnia: Secondary | ICD-10-CM | POA: Diagnosis not present

## 2021-07-13 DIAGNOSIS — Z6841 Body Mass Index (BMI) 40.0 and over, adult: Secondary | ICD-10-CM

## 2021-07-13 DIAGNOSIS — R61 Generalized hyperhidrosis: Secondary | ICD-10-CM

## 2021-07-13 DIAGNOSIS — I10 Essential (primary) hypertension: Secondary | ICD-10-CM

## 2021-07-13 MED ORDER — ALBUTEROL SULFATE HFA 108 (90 BASE) MCG/ACT IN AERS: 2.0000 | INHALATION_SPRAY | Freq: Four times a day (QID) | RESPIRATORY_TRACT | 3 refills | Status: DC | PRN

## 2021-07-13 NOTE — Patient Instructions (Addendum)
Please take duloxetine at dinner Call/send Mychart message to let Dr. Baird Cancer know how this works for you  Hypertension, Adult Hypertension is another name for high blood pressure. High blood pressure forces your heart to work harder to pump blood. This can cause problems over time. There are two numbers in a blood pressure reading. There is a top number (systolic) over a bottom number (diastolic). It is best to have a blood pressure that is below 120/80. Healthy choices can help lower your blood pressure, or you may need medicine to help lower it. What are the causes? The cause of this condition is not known. Some conditions may be related to high blood pressure. What increases the risk? Smoking. Having type 2 diabetes mellitus, high cholesterol, or both. Not getting enough exercise or physical activity. Being overweight. Having too much fat, sugar, calories, or salt (sodium) in your diet. Drinking too much alcohol. Having long-term (chronic) kidney disease. Having a family history of high blood pressure. Age. Risk increases with age. Race. You may be at higher risk if you are African American. Gender. Men are at higher risk than women before age 37. After age 53, women are at higher risk than men. Having obstructive sleep apnea. Stress. What are the signs or symptoms? High blood pressure may not cause symptoms. Very high blood pressure (hypertensive crisis) may cause: Headache. Feelings of worry or nervousness (anxiety). Shortness of breath. Nosebleed. A feeling of being sick to your stomach (nausea). Throwing up (vomiting). Changes in how you see. Very bad chest pain. Seizures. How is this treated? This condition is treated by making healthy lifestyle changes, such as: Eating healthy foods. Exercising more. Drinking less alcohol. Your health care provider may prescribe medicine if lifestyle changes are not enough to get your blood pressure under control, and if: Your top  number is above 130. Your bottom number is above 80. Your personal target blood pressure may vary. Follow these instructions at home: Eating and drinking  If told, follow the DASH eating plan. To follow this plan: Fill one half of your plate at each meal with fruits and vegetables. Fill one fourth of your plate at each meal with whole grains. Whole grains include whole-wheat pasta, brown rice, and whole-grain bread. Eat or drink low-fat dairy products, such as skim milk or low-fat yogurt. Fill one fourth of your plate at each meal with low-fat (lean) proteins. Low-fat proteins include fish, chicken without skin, eggs, beans, and tofu. Avoid fatty meat, cured and processed meat, or chicken with skin. Avoid pre-made or processed food. Eat less than 1,500 mg of salt each day. Do not drink alcohol if: Your doctor tells you not to drink. You are pregnant, may be pregnant, or are planning to become pregnant. If you drink alcohol: Limit how much you use to: 0-1 drink a day for women. 0-2 drinks a day for men. Be aware of how much alcohol is in your drink. In the [place] one drink equals one 12 oz bottle of beer (355 mL), one 5 oz glass of wine (148 mL), or one 1 oz glass of hard liquor (44 mL). Lifestyle  Work with your doctor to stay at a healthy weight or to lose weight. Ask your doctor what the best weight is for you. Get at least 30 minutes of exercise most days of the week. This may include walking, swimming, or biking. Get at least 30 minutes of exercise that strengthens your muscles (resistance exercise) at least 3 days a week.  This may include lifting weights or doing Pilates. Do not use any products that contain nicotine or tobacco, such as cigarettes, e-cigarettes, and chewing tobacco. If you need help quitting, ask your doctor. Check your blood pressure at home as told by your doctor. Keep all follow-up visits as told by your doctor. This is important. Medicines Take  over-the-counter and prescription medicines only as told by your doctor. Follow directions carefully. Do not skip doses of blood pressure medicine. The medicine does not work as well if you skip doses. Skipping doses also puts you at risk for problems. Ask your doctor about side effects or reactions to medicines that you should watch for. Contact a doctor if you: Think you are having a reaction to the medicine you are taking. Have headaches that keep coming back (recurring). Feel dizzy. Have swelling in your ankles. Have trouble with your vision. Get help right away if you: Get a very bad headache. Start to feel mixed up (confused). Feel weak or numb. Feel faint. Have very bad pain in your: Chest. Belly (abdomen). Throw up more than once. Have trouble breathing. Summary Hypertension is another name for high blood pressure. High blood pressure forces your heart to work harder to pump blood. For most people, a normal blood pressure is less than 120/80. Making healthy choices can help lower blood pressure. If your blood pressure does not get lower with healthy choices, you may need to take medicine. This information is not intended to replace advice given to you by your health care provider. Make sure you discuss any questions you have with your health care provider. Document Revised: 06/18/2018 Document Reviewed: 06/18/2018 Elsevier Patient Education  [place].

## 2021-07-13 NOTE — Progress Notes (Signed)
Rich Brave [place],acting as a Education administrator for Maximino Greenland, MD.,have documented all relevant documentation on the behalf of Maximino Greenland, MD,as directed by  Maximino Greenland, MD while in the presence of Maximino Greenland, MD.  This visit occurred during the SARS-CoV-2 public health emergency.  Safety protocols were in place, including screening questions prior to the visit, additional usage of staff PPE, and extensive cleaning of exam room while observing appropriate contact time as indicated for disinfecting solutions.  Subjective:     Patient ID: Shannon [name] , female    DOB: [birth year] , 40 y.o.   MRN: [id number]   Chief Complaint  Patient presents with   Hypertension    HPI  Patient presents today for a 12 week f/u on her bp.  She has been placed on propranolol nightly to help with headaches. She reports compliance with meds. Recently seen by Cardiology.   Hypertension This is a new problem. The current episode started more than 1 month ago. The problem has been gradually improving since onset. The problem is controlled. Associated symptoms include anxiety. Risk factors for coronary artery disease include obesity. Past treatments include beta blockers.    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   Vitamin D deficiency      Family History  Problem Relation Age of Onset   Hypertension Mother    Diabetes Mother    Mental illness Mother    Hyperlipidemia Mother    Stroke Mother    Depression Mother    Anxiety disorder Mother    Bipolar disorder Mother    Liver disease Mother    Sleep apnea Mother     Obesity Mother    Hypertension Father    Asthma Brother    Seizures Brother    Pancreatic cancer Maternal Aunt 70   Lung cancer Maternal Aunt        dx after 90; smoking hx   Liver cancer Maternal Uncle        dx after 73   Breast cancer Paternal Aunt        two paternal aunts, age 68s-40s   Ovarian cancer Paternal Aunt        two paternal aunts, reported BRCA mutation   Colon polyps Paternal Uncle        two paternal uncles; more than 68 lifetime polyps   Heart disease Maternal Grandmother    Hypertension Maternal Grandmother    Breast cancer Maternal Grandmother        dx 50s-40s; d. 74   Ovarian cancer Paternal Grandmother    Ovarian cancer Other        PGM's mother   Breast cancer Cousin        dx 32s   Ovarian cancer Cousin        dx 6s     Current Outpatient Medications:    Cholecalciferol (VITAMIN D3) 1.25 MG (50000 UT) CAPS, Take 50,000 Int'l Units by mouth once a week., Disp: , Rfl:    DULoxetine (CYMBALTA)  60 MG capsule, Take 60 mg by mouth See admin instructions. Take with 30 mg for a total of 90 mg at bedtime, Disp: , Rfl:    propranolol (INDERAL) 20 MG tablet, Take 1 tablet (20 mg total) by mouth 2 (two) times daily. (Patient taking differently: Take 40 mg by mouth at bedtime.), Disp: 60 tablet, Rfl: 3   VYVANSE 70 MG capsule, Take 70 mg by mouth every morning., Disp: , Rfl:    zolpidem (AMBIEN) 10 MG tablet, Take 10 mg by mouth at bedtime as needed for sleep., Disp: , Rfl:    acetaminophen (TYLENOL) 500 MG tablet, Take 500 mg by mouth every 6 (six) hours as needed for headache (Cramps)., Disp: , Rfl:    albuterol (VENTOLIN HFA) 108 (90 Base) MCG/ACT inhaler, Inhale 2 puffs into the lungs every 6 (six) hours as needed for wheezing or shortness of breath., Disp: 1 each, Rfl: 3   DULoxetine (CYMBALTA) 30 MG capsule, Take 30 mg by mouth See admin instructions. Take with 60 mg for a total of 90 mg at bedtime, Disp: , Rfl:    zolpidem (AMBIEN) 5 MG tablet, Take 1 tablet  (5 mg total) by mouth at bedtime as needed for sleep., Disp: 30 tablet, Rfl: 0   Allergies  Allergen Reactions   Ceftriaxone Hives and Rash    No SOB - patient willing to try again   Diclofenac Rash   Fluconazole Hives    Raw rash on abdomen and she had sores and swelling in mouth  Other reaction(s): Angioedema Raw rash on abdomen and she had sores and swelling in mouth    Pineapple Shortness Of Breath   Latex Rash   Bactrim [Sulfamethoxazole-Trimethoprim] Hives   Ceftriaxone Sodium Rash    No SOB - patient willing to try again     Review of Systems  Constitutional: Negative.   Respiratory: Negative.    Cardiovascular: Negative.   Endocrine: Positive for heat intolerance.       She c/o frequent episodes of sweating. Had labwork performed by Oncologist which has been neg thus far.   Neurological: Negative.   Psychiatric/Behavioral: Negative.      Today's Vitals   07/13/21 1055  BP: 118/76  Pulse: 86  Temp: 98.8 F (37.1 C)  Weight: 252 lb 9.6 oz (114.6 kg)  Height: 5' 4.8" (1.646 m)  PainSc: 0-No pain   Body mass index is 42.29 kg/m.  Wt Readings from Last 3 Encounters:  07/26/21 253 lb (114.8 kg)  07/25/21 252 lb (114.3 kg)  07/13/21 252 lb 9.6 oz (114.6 kg)     Objective:  Physical Exam Vitals and nursing note reviewed.  Constitutional:      Appearance: Normal appearance. She is obese.  HENT:     Head: Normocephalic and atraumatic.     Nose:     Comments: Masked     Mouth/Throat:     Comments: Masked  Eyes:     Extraocular Movements: Extraocular movements intact.  Cardiovascular:     Rate and Rhythm: Normal rate and regular rhythm.     Heart sounds: Normal heart sounds.  Pulmonary:     Effort: Pulmonary effort is normal.     Breath sounds: Normal breath sounds.  Musculoskeletal:     Cervical back: Normal range of motion.  Skin:    General: Skin is warm.  Neurological:     General: No focal deficit present.     Mental Status: She is alert.   Psychiatric:  Mood and Affect: Mood normal.        Behavior: Behavior normal.        Assessment And Plan:     1. Essential hypertension, benign Comments: Chronic, well controlled. Encouraged to follow low sodium diet. No need to change meds.  She has been cleared by Cardiology to proceed w/ wgt loss surgery.   2. Other depression Comments: Advised to take duloxetine w/ her evening meal. She will continue under the care of Stephannie Peters, NP.   3. Primary insomnia Comments: She is currently taking zolpidem. I will give her samples of Belsomra 79m, 260mto use nightly prn. She will let me know in a week or two if this is helpful.   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   5. Diaphoresis Comments: Persistent, referred to Endo, she has yet to hear from them. I will have my referral coordinator check on this.   6. Class 3 severe obesity due to excess calories without serious comorbidity with body mass index (BMI) of 40.0 to 44.9 in adult ([facility] BMI 42, she has started the process for bariatric surgery.     Patient was given opportunity to ask questions. Patient verbalized understanding of the plan and was able to repeat key elements of the plan. All questions were answered to their satisfaction.   I, RoMaximino GreenlandMD, have reviewed all documentation for this visit. The documentation on 07/13/21 for the exam, diagnosis, procedures, and orders are all accurate and complete.   IF YOU HAVE BEEN REFERRED TO A SPECIALIST, IT MAY TAKE 1-2 WEEKS TO SCHEDULE/PROCESS THE REFERRAL. IF YOU HAVE NOT HEARD FROM US/SPECIALIST IN TWO WEEKS, PLEASE GIVE [place] CALL AT [phone].   THE PATIENT IS ENCOURAGED TO PRACTICE SOCIAL DISTANCING DUE TO THE COVID-19 PANDEMIC.

## 2021-07-17 ENCOUNTER — Inpatient Hospital Stay: Payer: BC Managed Care – PPO | Attending: Internal Medicine | Admitting: Internal Medicine

## 2021-07-17 ENCOUNTER — Other Ambulatory Visit: Payer: Self-pay

## 2021-07-17 DIAGNOSIS — D447 Neoplasm of uncertain behavior of aortic body and other paraganglia: Secondary | ICD-10-CM | POA: Diagnosis not present

## 2021-07-17 NOTE — Progress Notes (Signed)
I connected with Shannon [name] on 07/17/21 at 10:00 AM EDT by telephone visit and verified that I am speaking with the correct person using two identifiers.  I discussed the limitations, risks, security and privacy concerns of performing an evaluation and management service by telemedicine and the availability of in-person appointments. I also discussed with the patient that there may be a patient responsible charge related to this service. The patient expressed understanding and agreed to proceed.  Other persons participating in the visit and their role in the encounter:  n/a Patient's location:  Home  Provider's location:  Office  Chief Complaint:  Paraganglioma [facility]) - Plan: MR [place]  History of Present Ilness: Shannon [name] describes improvement in headaches since starting the propanolol.  Still occurring, but less frequent and severe.  Obtained sleep study, which did not show sleep apnea.  Sweating episodes do continue as prior.  No change in back pain. 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  Could consider change to extended release if preferred.  Follow Up Instructions: RTC in February after repeat lumbar spine MRI.   I discussed the assessment and treatment plan with the patient.  The patient was provided an opportunity to ask questions and all were answered.  The patient agreed with the plan and demonstrated understanding of the instructions.    The patient was advised to call back or seek an in-person evaluation if the symptoms worsen or if the condition fails to improve as anticipated.  I provided 5-10 minutes of non-face-to-face time during this enocunter.  Ventura Sellers, MD   I provided 15 minutes of non face-to-face telephone visit time during this encounter, and > 50% was spent counseling as documented under my  assessment & plan.

## 2021-07-18 ENCOUNTER — Telehealth: Payer: Self-pay | Admitting: Internal Medicine

## 2021-07-18 NOTE — Telephone Encounter (Signed)
Scheduled appt per 9/26 los. Pt aware.

## 2021-07-21 ENCOUNTER — Encounter: Payer: Self-pay | Admitting: *Deleted

## 2021-07-21 NOTE — Progress Notes (Signed)
CSW completed Cancer Care form at patient's request and sent to patient.  Maryjean Morn, MSW, LCSW, OSW-C Clinical Social Worker [facility] [phone]

## 2021-07-25 ENCOUNTER — Encounter (HOSPITAL_COMMUNITY)
Admission: RE | Disposition: A | Discharge status: Home / Self Care | Payer: Self-pay | Source: Home / Self Care | Attending: Surgery

## 2021-07-25 ENCOUNTER — Ambulatory Visit (HOSPITAL_COMMUNITY): Payer: BC Managed Care – PPO | Admitting: Anesthesiology

## 2021-07-25 ENCOUNTER — Ambulatory Visit (HOSPITAL_COMMUNITY)
Admission: RE | Admit: 2021-07-25 | Discharge: 2021-07-25 | Disposition: A | Discharge status: Home / Self Care | Payer: BC Managed Care – PPO | Attending: Surgery | Admitting: Surgery

## 2021-07-25 ENCOUNTER — Encounter (HOSPITAL_COMMUNITY): Payer: Self-pay | Admitting: Surgery

## 2021-07-25 ENCOUNTER — Other Ambulatory Visit: Payer: Self-pay

## 2021-07-25 DIAGNOSIS — Z6841 Body Mass Index (BMI) 40.0 and over, adult: Secondary | ICD-10-CM | POA: Insufficient documentation

## 2021-07-25 DIAGNOSIS — Z800 Family history of malignant neoplasm of digestive organs: Secondary | ICD-10-CM | POA: Diagnosis not present

## 2021-07-25 DIAGNOSIS — Z01818 Encounter for other preprocedural examination: Secondary | ICD-10-CM | POA: Insufficient documentation

## 2021-07-25 DIAGNOSIS — Z9109 Other allergy status, other than to drugs and biological substances: Secondary | ICD-10-CM | POA: Insufficient documentation

## 2021-07-25 DIAGNOSIS — Z8249 Family history of ischemic heart disease and other diseases of the circulatory system: Secondary | ICD-10-CM | POA: Diagnosis not present

## 2021-07-25 DIAGNOSIS — Z881 Allergy status to other antibiotic agents status: Secondary | ICD-10-CM | POA: Diagnosis not present

## 2021-07-25 DIAGNOSIS — Z79899 Other long term (current) drug therapy: Secondary | ICD-10-CM | POA: Insufficient documentation

## 2021-07-25 DIAGNOSIS — Z882 Allergy status to sulfonamides status: Secondary | ICD-10-CM | POA: Insufficient documentation

## 2021-07-25 DIAGNOSIS — Z91018 Allergy to other foods: Secondary | ICD-10-CM | POA: Diagnosis not present

## 2021-07-25 DIAGNOSIS — E785 Hyperlipidemia, unspecified: Secondary | ICD-10-CM | POA: Insufficient documentation

## 2021-07-25 DIAGNOSIS — I10 Essential (primary) hypertension: Secondary | ICD-10-CM | POA: Diagnosis not present

## 2021-07-25 DIAGNOSIS — Z833 Family history of diabetes mellitus: Secondary | ICD-10-CM | POA: Insufficient documentation

## 2021-07-25 DIAGNOSIS — Z803 Family history of malignant neoplasm of breast: Secondary | ICD-10-CM | POA: Diagnosis not present

## 2021-07-25 DIAGNOSIS — Z8041 Family history of malignant neoplasm of ovary: Secondary | ICD-10-CM | POA: Diagnosis not present

## 2021-07-25 DIAGNOSIS — K319 Disease of stomach and duodenum, unspecified: Secondary | ICD-10-CM | POA: Insufficient documentation

## 2021-07-25 DIAGNOSIS — Z888 Allergy status to other drugs, medicaments and biological substances status: Secondary | ICD-10-CM | POA: Insufficient documentation

## 2021-07-25 DIAGNOSIS — Z823 Family history of stroke: Secondary | ICD-10-CM | POA: Insufficient documentation

## 2021-07-25 DIAGNOSIS — Z91040 Latex allergy status: Secondary | ICD-10-CM | POA: Insufficient documentation

## 2021-07-25 DIAGNOSIS — Z883 Allergy status to other anti-infective agents status: Secondary | ICD-10-CM | POA: Insufficient documentation

## 2021-07-25 DIAGNOSIS — Z801 Family history of malignant neoplasm of trachea, bronchus and lung: Secondary | ICD-10-CM | POA: Diagnosis not present

## 2021-07-25 DIAGNOSIS — K219 Gastro-esophageal reflux disease without esophagitis: Secondary | ICD-10-CM | POA: Diagnosis not present

## 2021-07-25 HISTORY — PX: BIOPSY: SHX5522

## 2021-07-25 HISTORY — PX: ESOPHAGOGASTRODUODENOSCOPY: SHX5428

## 2021-07-25 SURGERY — EGD (ESOPHAGOGASTRODUODENOSCOPY)
Anesthesia: Monitor Anesthesia Care

## 2021-07-25 MED ORDER — PROPOFOL 500 MG/50ML IV EMUL
INTRAVENOUS | Status: DC | PRN
  Administered 2021-07-25: 125 ug/kg/min via INTRAVENOUS

## 2021-07-25 MED ORDER — PROPOFOL 10 MG/ML IV BOLUS
INTRAVENOUS | Status: DC | PRN
  Administered 2021-07-25 (×3): 20 mg via INTRAVENOUS

## 2021-07-25 MED ORDER — LIDOCAINE 2% (20 MG/ML) 5 ML SYRINGE
INTRAMUSCULAR | Status: DC | PRN
  Administered 2021-07-25: 100 mg via INTRAVENOUS

## 2021-07-25 MED ORDER — ONDANSETRON HCL 4 MG/2ML IJ SOLN
INTRAMUSCULAR | Status: DC | PRN
Start: 2021-07-25 — End: 2021-07-25
  Administered 2021-07-25: 4 mg via INTRAVENOUS

## 2021-07-25 MED ORDER — LACTATED RINGERS IV SOLN: INTRAVENOUS | Status: DC

## 2021-07-25 NOTE — Anesthesia Preprocedure Evaluation (Addendum)
Anesthesia Evaluation  Patient identified by MRN, date of birth, ID band Patient awake    Reviewed: Allergy & Precautions, NPO status , Patient's Chart, lab work & pertinent test results  History of Anesthesia Complications (+) PONV and history of anesthetic complications  Airway Mallampati: II  TM Distance: >3 FB Neck ROM: Full    Dental  (+) Dental Advisory Given   Pulmonary asthma ,    Pulmonary exam normal        Cardiovascular hypertension, Normal cardiovascular exam     Neuro/Psych  Headaches, PSYCHIATRIC DISORDERS Anxiety Depression    GI/Hepatic negative GI ROS, Neg liver ROS,   Endo/Other  Morbid obesity  Renal/GU negative Renal ROS     Musculoskeletal negative musculoskeletal ROS (+)   Abdominal (+) + obese,   Peds  Hematology negative hematology ROS (+)   Anesthesia Other Findings - HLD  Reproductive/Obstetrics                            Anesthesia Physical  Anesthesia Plan  ASA: 3  Anesthesia Plan: MAC   Post-op Pain Management:    Induction: Intravenous  PONV Risk Score and Plan: 3 and Ondansetron, Midazolam and Propofol infusion  Airway Management Planned: Natural Airway  Additional Equipment:   Intra-op Plan:   Post-operative Plan:   Informed Consent: I have reviewed the patients History and Physical, chart, labs and discussed the procedure including the risks, benefits and alternatives for the proposed anesthesia with the patient or authorized representative who has indicated his/her understanding and acceptance.     Dental advisory given  Plan Discussed with: Anesthesiologist and CRNA  Anesthesia Plan Comments:        Anesthesia Quick Evaluation

## 2021-07-25 NOTE — H&P (Signed)
Admitting Physician: [place]  Service: Bariatric Surgery  CC: Morbid Obesity  Subjective   HPI: Shannon [name] is an 40 y.o. female who is here for elective EGD prior to bariatric surgery.  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   Vitamin D deficiency     Past Surgical History:  Procedure Laterality Date   BACK SURGERY     DILATATION & CURRETTAGE/HYSTEROSCOPY WITH RESECTOCOPE N/A 02/03/2014   Procedure: DILATATION & CURETTAGE/HYSTEROSCOPY ;  Surgeon: Delice Lesch, MD;  Location: [facility];  Service: Gynecology;  Laterality: N/A;   DILATION AND CURETTAGE OF UTERUS     LAMINECTOMY N/A 03/17/2021   Procedure: Laminectomy - Lumbar Four-Lumbar Five for Intradural mass;  Surgeon: Kary Kos, MD;  Location: [facility];  Service: Neurosurgery;  Laterality: N/A;  3C   MYOMECTOMY N/A 02/03/2014   Procedure: MYOMECTOMY ;  Surgeon: Delice Lesch, MD;  Location: [facility];  Service: Gynecology;  Laterality: N/A;   SALPINGECTOMY     TONSILLECTOMY     tonsils and adnoids removed     TUBAL LIGATION N/A 08/19/2015   Procedure: POST PARTUM TUBAL LIGATION;  Surgeon: Everett Graff, MD;  Location: [facility];  Service: Gynecology;  Laterality: N/A;   WISDOM TOOTH EXTRACTION      Family History  Problem Relation Age of Onset   Hypertension Mother    Diabetes Mother    Mental illness Mother    Hyperlipidemia Mother    Stroke Mother    Depression Mother    Anxiety disorder Mother    Bipolar disorder Mother    Liver disease Mother    Sleep apnea Mother    Obesity Mother    Hypertension Father    Asthma Brother     Seizures Brother    Pancreatic cancer Maternal Aunt 20   Lung cancer Maternal Aunt        dx after 67; smoking hx   Liver cancer Maternal Uncle        dx after 58   Breast cancer Paternal Aunt        two paternal aunts, age 83s-40s   Ovarian cancer Paternal Aunt        two paternal aunts, reported BRCA mutation   Colon polyps Paternal Uncle        two paternal uncles; more than 63 lifetime polyps   Heart disease Maternal Grandmother    Hypertension Maternal Grandmother    Breast cancer Maternal Grandmother        dx 24s-40s; d. 15   Ovarian cancer Paternal Grandmother    Ovarian cancer Other        PGM's mother   Breast cancer Cousin        dx 73s   Ovarian cancer Cousin        dx 66s    Social:  reports that she has never smoked. She has never used smokeless tobacco. She reports that she does not drink alcohol and does not use drugs.  Allergies:  Allergies  Allergen Reactions   Ceftriaxone Hives and Rash    No SOB - patient willing to try again   Diclofenac Rash   Fluconazole Hives    Raw rash on abdomen and she had sores and swelling in mouth  Other reaction(s): Angioedema Raw rash on abdomen and she had sores and swelling in mouth    Pineapple Shortness Of Breath   Latex Rash   Bactrim [Sulfamethoxazole-Trimethoprim] Hives   Ceftriaxone Sodium Rash    No SOB - patient willing to try again    Medications: Current Outpatient Medications  Medication Instructions   acetaminophen (TYLENOL) 500 mg, Oral, Every 6 hours PRN   albuterol (VENTOLIN HFA) 108 (90 Base) MCG/ACT inhaler 2 puffs, Inhalation, Every 6 hours PRN   Cholecalciferol (VITAMIN D3) 1.25 MG (50000 UT) CAPS 50,000 Int'l Units, Oral, Weekly   DULoxetine (CYMBALTA) 60 mg, Oral, See admin instructions, Take with 30 mg for a total of 90 mg at bedtime   DULoxetine (CYMBALTA) 30 mg, Oral, See admin instructions, Take with 60 mg for a total of 90 mg at bedtime   propranolol (INDERAL) 20 mg, Oral, 2 times  daily   Vyvanse 70 mg, Oral, Every morning   zolpidem (AMBIEN) 10 mg, Oral, At bedtime PRN    ROS - all of the below systems have been reviewed with the patient and positives are indicated with bold text General: chills, fever or night sweats Eyes: blurry vision or double vision ENT: epistaxis or sore throat Allergy/Immunology: itchy/watery eyes or nasal congestion Hematologic/Lymphatic: bleeding problems, blood clots or swollen lymph nodes Endocrine: temperature intolerance or unexpected weight changes Breast: new or changing breast lumps or nipple discharge Resp: cough, shortness of breath, or wheezing CV: chest pain or dyspnea on exertion GI: as per HPI GU: dysuria, trouble voiding, or hematuria MSK: joint pain or joint stiffness Neuro: TIA or stroke symptoms Derm: pruritus and skin lesion changes Psych: anxiety and depression  Objective   PE Blood pressure 123/75, pulse 88, temperature 97.7 F (36.5 C), temperature source Temporal, resp. rate 20, height _0  (1.626 m), weight 114.3 kg, last menstrual period 07/13/2021, SpO2 100 %, unknown if currently breastfeeding. Constitutional: NAD; conversant; no deformities Eyes: Moist conjunctiva; no lid lag; anicteric; PERRL Neck: Trachea midline; no thyromegaly Lungs: Normal respiratory effort; no tactile fremitus CV: RRR; no palpable thrills; no pitting edema GI: Abd Soft, non-tender; no palpable hepatosplenomegaly MSK: Normal range of motion of extremities; no clubbing/cyanosis Psychiatric: Appropriate affect; alert and oriented x3 Lymphatic: No palpable cervical or axillary lymphadenopathy  No results found for this or any previous visit (from the past 24 hour(s)).  Imaging Orders  No imaging studies ordered today     Assessment and Plan   Shannon [name] is a 40 y.o. female who is seen at the request of Dr. Baird Cancer for bariatric surgery consultation. The patient has morbid obesity with a BMI of Body mass index is 42.93  kg/m and the following conditions related to obesity: hypertension, hyperlipidemia.  We discussed the surgical options to treat obesity and its associated comorbidity. After discussing the available procedures in the region, we discussed in great detail the surgeries I offer: robotic sleeve gastrectomy and robotic roux-en-y gastric bypass. We discussed the procedures themselves as well as their risks, benefits and alternatives. I entered the patient's basic information into the [facility] Metabolic Surgery Risk/Benefit Calculator to facilitate this discussion.   After a full discussion and all questions answered, the patient is interested in pursuing a sleeve gastrectomy.  We will initiate the bariatric surgery preoperative pathway to include the following: - Bloodwork - Dietician consult - EKG - Completed 06/29/21 - Psychology evaluation - Sleep study - already ordered by PCP - Completed 06/27/21 with Dr. Annamaria Boots.  No sleep apnea, some insomnia - Cardiology preoperative evaluation - completed with Dr. Domenic Polite 06/29/21.  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   - Upper endoscopy with biopsy. I explained my rational for performing upper endoscopy in my bariatric patients. During the procedure I will biopsy for H. Pylori, evaluate for hiatal hernia, evaluate for reflux esophagitis and look for any other abnormalities that may influence the procedure. We discussed the risks, benefits and alternative to this procedure and the patient granted consent to proceed.  We will proceed with upper endoscopy today.   Felicie Morn, MD  [facility] Use [email or web address] to contact on call provider

## 2021-07-25 NOTE — Op Note (Signed)
[facility] Patient Name: Shannon [name] Procedure Date: 07/25/2021 MRN: [id number] Attending MD: Felicie Morn ,  Date of Birth: Jun 25, 1981 CSN: [id number] Age: 40 Admit Type: Outpatient Procedure:                Upper GI endoscopy Indications:               Providers:                Felicie Morn, Elmer Ramp. Tilden Dome, RN, Tyrone Apple, Technician, Luan Moore, Technician,                            Danley Danker, CRNA Referring MD:              Medicines:                Monitored Anesthesia Care Complications:            No immediate complications. Estimated Blood Loss:     Estimated blood loss was minimal. Procedure:                Pre-Anesthesia Assessment:                           - Prior to the procedure, a History and Physical                            was performed, and patient medications and                            allergies were reviewed. The patient is competent.                            The risks and benefits of the procedure and the                            sedation options and risks were discussed with the                            patient. All questions were answered and informed                            consent was obtained. Patient identification and                            proposed procedure were verified in the                            pre-procedure area. Mental Status Examination:                            alert and oriented. Prophylactic Antibiotics: The                            patient does not require prophylactic  antibiotics.                            Prior Anticoagulants: The patient has taken no                            previous anticoagulant or antiplatelet agents. ASA                            Grade Assessment: II - A patient with mild systemic                            disease. After reviewing the risks and benefits,                            the patient was deemed in  satisfactory condition to                            undergo the procedure. The anesthesia plan was to                            use monitored anesthesia care (MAC). Immediately                            prior to administration of medications, the patient                            was re-assessed for adequacy to receive sedatives.                            The heart rate, respiratory rate, oxygen                            saturations, blood pressure, adequacy of pulmonary                            ventilation, and response to care were monitored                            throughout the procedure. The physical status of                            the patient was re-assessed after the procedure.                           After obtaining informed consent, the endoscope was                            passed under direct vision. Throughout the                            procedure, the patient's blood pressure, pulse, and  oxygen saturations were monitored continuously. The                            GIF-H190 ([id number] Olympus endoscope was introduced                            through the mouth, and advanced to the second part                            of duodenum. The upper GI endoscopy was                            accomplished without difficulty. The patient                            tolerated the procedure well. Scope In: Scope Out: Findings:      The examined esophagus was normal.      The gastroesophageal flap valve was visualized endoscopically and       classified as Hill Grade I (prominent fold, tight to endoscope).      The entire examined stomach was normal. Biopsies were taken with a cold       forceps for Helicobacter pylori testing. Verification of patient       identification for the specimen was done. Estimated blood loss was       minimal.      The in the duodenum was normal. Impression:               - Normal esophagus.                            - Gastroesophageal flap valve classified as Hill                            Grade I (prominent fold, tight to endoscope).                           - Normal stomach. Biopsied.                           - Normal.                           - Patient appears to be an appropriate candidate                            for sleeve gastrectomy. Moderate Sedation:      Moderate (conscious) sedation was personally administered by an       anesthesia professional. The following parameters were monitored: oxygen       saturation, heart rate, blood pressure, and response to care. Total       physician intraservice time was 15 minutes. Recommendation:           - Discharge patient to home (ambulatory).                           - Resume previous diet.                           -  Continue present medications.                           - Await pathology results. Procedure Code(s):        --- Professional ---                           [phone], Esophagogastroduodenoscopy, flexible,                            transoral; with biopsy, single or multiple Diagnosis Code(s):        --- Professional ---                           T55.217, Encounter for other preprocedural                            examination CPT copyright 2019 [employer]. All rights reserved. The codes documented in this report are preliminary and upon coder review may  be revised to meet current compliance requirements. [place],  07/25/2021 1:09:18 PM This report has been signed electronically. Number of Addenda: 0

## 2021-07-25 NOTE — Transfer of Care (Signed)
Immediate Anesthesia Transfer of Care Note  Patient: Shannon [name]  Procedure(s) Performed: ESOPHAGOGASTRODUODENOSCOPY (EGD)  Patient Location: Endoscopy Unit  Anesthesia Type:MAC  Level of Consciousness: awake, alert  and oriented  Airway & Oxygen Therapy: Patient Spontanous Breathing and Patient connected to face mask oxygen  Post-op Assessment: Report given to RN and Post -op Vital signs reviewed and stable  Post vital signs: Reviewed and stable  Last Vitals:  Vitals Value Taken Time  BP    Temp    Pulse    Resp    SpO2      Last Pain:  Vitals:   07/25/21 1039  TempSrc: Temporal  PainSc: 0-No pain         Complications: No notable events documented.

## 2021-07-25 NOTE — Anesthesia Postprocedure Evaluation (Signed)
Anesthesia Post Note  Patient: Shannon [name]  Procedure(s) Performed: ESOPHAGOGASTRODUODENOSCOPY (EGD)     Patient location during evaluation: Endoscopy Anesthesia Type: MAC Level of consciousness: awake and alert Pain management: pain level controlled Vital Signs Assessment: post-procedure vital signs reviewed and stable Respiratory status: spontaneous breathing, nonlabored ventilation, respiratory function stable and patient connected to nasal cannula oxygen Cardiovascular status: blood pressure returned to baseline and stable Postop Assessment: no apparent nausea or vomiting Anesthetic complications: no   No notable events documented.  Last Vitals:  Vitals:   07/25/21 1320 07/25/21 1330  BP: (!) 131/94   Pulse: 88   Resp: 19   Temp:    SpO2: 99% 97%    Last Pain:  Vitals:   07/25/21 1330  TempSrc:   PainSc: 0-No pain                 Kylo Gavin DANIEL

## 2021-07-25 NOTE — Anesthesia Procedure Notes (Signed)
Date/Time: 07/25/2021 12:48 PM Performed by: Sharlette Dense, CRNA Oxygen Delivery Method: Simple face mask

## 2021-07-26 ENCOUNTER — Encounter: Payer: BC Managed Care – PPO | Attending: Surgery | Admitting: Skilled Nursing Facility1

## 2021-07-26 ENCOUNTER — Encounter (HOSPITAL_COMMUNITY): Payer: Self-pay | Admitting: Surgery

## 2021-07-26 DIAGNOSIS — Z6839 Body mass index (BMI) 39.0-39.9, adult: Secondary | ICD-10-CM | POA: Diagnosis present

## 2021-07-26 DIAGNOSIS — E6609 Other obesity due to excess calories: Secondary | ICD-10-CM | POA: Insufficient documentation

## 2021-07-26 LAB — SURGICAL PATHOLOGY

## 2021-07-26 NOTE — Progress Notes (Signed)
Nutrition Assessment for Bariatric Surgery Medical Nutrition Therapy  Patient was seen on 07/26/2021 for Pre-Operative Nutrition Assessment. Letter of approval faxed to [facility] bariatric surgery program coordinator on 07/26/2021.   Referral stated Supervised Weight Loss (SWL) visits needed: 0  Pt completed visits.   Pt has cleared nutrition requirements.    Planned surgery: sleeve gastrectomy  Pt expectation of surgery: to lose weight Pt expectation of dietitian: none identified     NUTRITION ASSESSMENT   Anthropometrics  Start weight at [facility]: 253 lbs (date: 07/26/2021)  Height: 64.5 in BMI: 42.76 kg/m2     Clinical  Medical hx: ADD, anxiety, depression Medications: see list  Labs: A!c 5.7, cholesterol 215 Notable signs/symptoms: N/A Any previous deficiencies? No  Micronutrient Nutrition Focused Physical Exam: Hair: No issues observed Eyes: No issues observed Mouth: No issues observed Neck: No issues observed Nails: No issues observed Skin: No issues observed  Lifestyle & Dietary Hx  Pt states she had a back surgery and was in the H for 1 moth which caused some depression for her.   Pt states she has a great support system at home.  Pt states she is actively working on increasing her activity since having back surgery.   24-Hr Dietary Recall First Meal: skipped Snack:  Second Meal: [place] wrap + tomato + lettuce + guacamole + chips Snack: nuts Third Meal: chicken or [place] or fish and green beans or greens or broccoli and rice Snack:  Beverages: propel, water   Estimated Energy Needs Calories: 1600   NUTRITION DIAGNOSIS  Overweight/obesity ([place]-3.3) related to past poor dietary habits and physical inactivity as evidenced by patient w/ planned sleeve gastrectomy surgery following dietary guidelines for continued weight loss.    NUTRITION INTERVENTION  Nutrition counseling (C-1) and education (E-2) to facilitate bariatric surgery  goals.  Educated pt on micronutrient deficiencies post surgery and strategies to mitigate that risk   Pre-Op Goals Reviewed with the Patient Track food and beverage intake (pen and paper, MyFitness Pal, Baritastic app, etc.) Make healthy food choices while monitoring portion sizes Consume 3 meals per day or try to eat every 3-5 hours Avoid concentrated sugars and fried foods Keep sugar & fat in the single digits per serving on food labels Practice CHEWING your food (aim for applesauce consistency) Practice not drinking 15 minutes before, during, and 30 minutes after each meal and snack Avoid all carbonated beverages (ex: soda, sparkling beverages)  Limit caffeinated beverages (ex: coffee, tea, energy drinks) Avoid all sugar-sweetened beverages (ex: regular soda, sports drinks)  Avoid alcohol  Aim for 64-100 ounces of FLUID daily (with at least half of fluid intake being plain water)  Aim for at least 60-80 grams of PROTEIN daily Look for a liquid protein source that contains ?15 g protein and ?5 g carbohydrate (ex: shakes, drinks, shots) Make a list of non-food related activities Physical activity is an important part of a healthy lifestyle so keep it moving! The goal is to reach 150 minutes of exercise per week, including cardiovascular and weight baring activity.  *Goals that are bolded indicate the pt would like to start working towards these  Handouts Provided Include  Bariatric Surgery handouts (Nutrition Visits, Pre-Op Goals, Protein Shakes, Vitamins & Minerals)  Learning Style & Readiness for Change Teaching method utilized: Visual & Auditory  Demonstrated degree of understanding via: Teach Back  Readiness Level: action Barriers to learning/adherence to lifestyle change: none identified      MONITORING & EVALUATION Dietary intake, weekly  physical activity, body weight, and pre-op goals reached at next nutrition visit.    Next Steps  Patient is to follow up at [facility] for  Pre-Op Class >2 weeks before surgery for further nutrition education.  Pt has completed visits. No further supervised visits required/recomended

## 2021-07-27 ENCOUNTER — Other Ambulatory Visit: Payer: Self-pay | Admitting: Internal Medicine

## 2021-07-27 ENCOUNTER — Encounter: Payer: Self-pay | Admitting: Internal Medicine

## 2021-07-27 MED ORDER — ZOLPIDEM TARTRATE 5 MG PO TABS: 5.0000 mg | ORAL_TABLET | Freq: Every evening | ORAL | 0 refills | Status: DC | PRN

## 2021-08-01 ENCOUNTER — Ambulatory Visit (INDEPENDENT_AMBULATORY_CARE_PROVIDER_SITE_OTHER): Payer: BC Managed Care – PPO

## 2021-08-01 ENCOUNTER — Encounter (HOSPITAL_BASED_OUTPATIENT_CLINIC_OR_DEPARTMENT_OTHER): Payer: Self-pay | Admitting: Cardiology

## 2021-08-01 ENCOUNTER — Other Ambulatory Visit: Payer: Self-pay

## 2021-08-01 ENCOUNTER — Ambulatory Visit (INDEPENDENT_AMBULATORY_CARE_PROVIDER_SITE_OTHER): Payer: BC Managed Care – PPO | Admitting: Cardiology

## 2021-08-01 VITALS — BP 134/94 | HR 109 | Ht 64.5 in | Wt 255.2 lb

## 2021-08-01 DIAGNOSIS — R000 Tachycardia, unspecified: Secondary | ICD-10-CM

## 2021-08-01 DIAGNOSIS — R002 Palpitations: Secondary | ICD-10-CM

## 2021-08-01 DIAGNOSIS — R030 Elevated blood-pressure reading, without diagnosis of hypertension: Secondary | ICD-10-CM | POA: Diagnosis not present

## 2021-08-01 DIAGNOSIS — R0602 Shortness of breath: Secondary | ICD-10-CM | POA: Diagnosis not present

## 2021-08-01 DIAGNOSIS — Z7189 Other specified counseling: Secondary | ICD-10-CM

## 2021-08-01 NOTE — Progress Notes (Signed)
Cardiology Office Note:    Date:  08/01/2021   ID:  Shannon [name], DOB 09-07-1981, MRN [id number]  PCP:  Glendale Chard, MD  Cardiologist:  Buford Dresser, MD  Referring MD: Glendale Chard, MD   CC: new patient consultation for shortness of breath and tachycardia   History of Present Illness:    Shannon [name] is a 40 y.o. female with a hx of ADD, anemia, anxiety, asthma, depression, hyperlipidemia, hypertension, obesity, and paraganglioma, who is seen as a new consult at the request of Glendale Chard, MD for the evaluation and management of shortness of breath and tachycardia.  Notes from Dr. Baird Cancer, most recent 07/13/21, reviewed. She was seen by Dr. Domenic Polite on 06/29/21.   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 -Cardiac ROS: no chest pain, +shortness of breath, no PND, no orthopnea, +LE edema.  Since her recent visit with Dr. Domenic Polite, she continues to have sudden, random diaphoresis. Sometimes has mild bilateral LE edema.  Overall, her heart rate seems to have improved since beginning propranolol, but to her it still feels too fast.  She has been referred to an endocrinologist.  Endorses asthma, but has never needed to use her inhaler as often as she is recently. During a recent sleep study, she woke up and needed to use her inhaler. She reports that sleep apnea was ruled out but she did have severe insomnia.  She denies any chest pain. No headaches, syncope, orthopnea, or PND.    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   Vitamin D deficiency     Past Surgical History:  Procedure Laterality Date   BACK SURGERY     BIOPSY  07/25/2021   Procedure: BIOPSY;  Surgeon: Felicie Morn, MD;  Location: [facility];  Service: General;;   [place] N/A 02/03/2014   Procedure: DILATATION & CURETTAGE/HYSTEROSCOPY ;  Surgeon: Delice Lesch, MD;  Location: [facility];  Service: Gynecology;  Laterality: N/A;   DILATION AND CURETTAGE OF UTERUS     ESOPHAGOGASTRODUODENOSCOPY N/A 07/25/2021   Procedure: ESOPHAGOGASTRODUODENOSCOPY (EGD);  Surgeon: Felicie Morn, MD;  Location: [facility];  Service: General;  Laterality: N/A;   LAMINECTOMY N/A 03/17/2021   Procedure: Laminectomy - Lumbar Four-Lumbar Five for Intradural mass;  Surgeon: Kary Kos, MD;  Location: [facility];  Service: Neurosurgery;  Laterality: N/A;  3C   MYOMECTOMY N/A 02/03/2014   Procedure: MYOMECTOMY ;  Surgeon: Delice Lesch, MD;  Location: [facility];  Service: Gynecology;  Laterality: N/A;   SALPINGECTOMY     TONSILLECTOMY     tonsils and adnoids removed     TUBAL LIGATION N/A 08/19/2015   Procedure: POST PARTUM TUBAL LIGATION;  Surgeon: Everett Graff,  MD;  Location: [facility];  Service: Gynecology;  Laterality: N/A;   WISDOM TOOTH EXTRACTION      Current Medications: Current Outpatient Medications on File Prior to Visit  Medication Sig   acetaminophen (TYLENOL) 500 MG tablet Take 500 mg by mouth every 6 (six) hours as needed for headache (Cramps).   albuterol (VENTOLIN HFA) 108 (90 Base) MCG/ACT  inhaler Inhale 2 puffs into the lungs every 6 (six) hours as needed for wheezing or shortness of breath.   Cholecalciferol (VITAMIN D3) 1.25 MG (50000 UT) CAPS Take 50,000 Int'l Units by mouth once a week.   DULoxetine (CYMBALTA) 30 MG capsule Take 30 mg by mouth See admin instructions. Take with 60 mg for a total of 90 mg at bedtime   DULoxetine (CYMBALTA) 60 MG capsule Take 60 mg by mouth See admin instructions. Take with 30 mg for a total of 90 mg at bedtime   propranolol (INDERAL) 20 MG tablet Take 1 tablet (20 mg total) by mouth 2 (two) times daily. (Patient taking differently: Take 40 mg by mouth at bedtime.)   VYVANSE 70 MG capsule Take 70 mg by mouth every morning.   zolpidem (AMBIEN) 10 MG tablet Take 10 mg by mouth at bedtime as needed for sleep.   zolpidem (AMBIEN) 5 MG tablet Take 1 tablet (5 mg total) by mouth at bedtime as needed for sleep.   No current facility-administered medications on file prior to visit.     Allergies:   Ceftriaxone, Diclofenac, Fluconazole, Pineapple, Latex, Bactrim [sulfamethoxazole-trimethoprim], and Ceftriaxone sodium   Social History   Tobacco Use   Smoking status: Never   Smokeless tobacco: Never  Vaping Use   Vaping Use: Never used  Substance Use Topics   Alcohol use: No    Comment: occasionally   Drug use: No    Family History: family history includes Anxiety disorder in her mother; Asthma in her brother; Bipolar disorder in her mother; Breast cancer in her cousin, maternal grandmother, and paternal aunt; Colon polyps in her paternal uncle; Depression in her mother; Diabetes in her mother; Heart disease in her maternal grandmother; Hyperlipidemia in her mother; Hypertension in her father, maternal grandmother, and mother; Liver cancer in her maternal uncle; Liver disease in her mother; Lung cancer in her maternal aunt; Mental illness in her mother; Obesity in her mother; Ovarian cancer in her cousin, paternal aunt, paternal grandmother, and  another family member; Pancreatic cancer (age of onset: 50) in her maternal aunt; Seizures in her brother; Sleep apnea in her mother; Stroke in her mother.  ROS:   Please see the history of present illness.  Additional pertinent ROS: Constitutional: Positive for diaphoresis. Negative for chills, fever, night sweats, unintentional weight loss  HENT: Negative for ear pain and hearing loss.   Eyes: Negative for loss of vision and eye pain.  Respiratory: Negative for cough, sputum, wheezing.   Cardiovascular: See HPI. Gastrointestinal: Negative for abdominal pain, melena, and hematochezia.  Genitourinary: Negative for dysuria and hematuria.  Musculoskeletal: Negative for falls and myalgias.  Skin: Negative for itching and rash.  Neurological: Positive for lightheadedness. Negative for focal weakness, focal sensory changes and loss of consciousness.  Endo/Heme/Allergies: Does not bruise/bleed easily.     EKGs/Labs/Other Studies Reviewed:    The following studies were reviewed today:  Echo 05/29/2021:  1. Left ventricular ejection fraction, by estimation, is 60 to 65%. The  left ventricle has normal function. The left ventricle has no regional  wall motion abnormalities. Left ventricular  diastolic parameters were  normal.   2. Right ventricular systolic function is normal. The right ventricular  size is normal.   3. The mitral valve is normal in structure. No evidence of mitral valve  regurgitation. No evidence of mitral stenosis.   4. The aortic valve is normal in structure. Aortic valve regurgitation is  not visualized. No aortic stenosis is present.   5. The inferior vena cava is normal in size with greater than 50%  respiratory variability, suggesting right atrial pressure of 3 mmHg.  EKG:  EKG is personally reviewed.   08/01/2021: sinus tachycardia at 109 bpm  Recent Labs: 03/25/2021: ALT 23 04/18/2021: BNP <2.5; Magnesium 2.1; Platelets 220 06/07/2021: BUN 9; Creatinine 1.0;  Hemoglobin 12.6; Potassium 4.6; Sodium 140; TSH 1.19   Recent Lipid Panel    Component Value Date/Time   CHOL 215 (A) 06/07/2021 0000   CHOL 221 (H) 08/08/2020 1653   TRIG 110 06/07/2021 0000   HDL 46 06/07/2021 0000   HDL 68 08/08/2020 1653   CHOLHDL 3.3 08/08/2020 1653   CHOLHDL 2.9 11/20/2012 1535   VLDL 15 11/20/2012 1535   LDLCALC 149 06/07/2021 0000   LDLCALC 134 (H) 08/08/2020 1653    Physical Exam:    VS:  BP (!) 134/94   Pulse (!) 109   Ht 5' 4.5" (1.638 m)   Wt 255 lb 3.2 oz (115.8 kg)   LMP 07/13/2021   SpO2 98%   BMI 43.13 kg/m     Wt Readings from Last 3 Encounters:  08/01/21 255 lb 3.2 oz (115.8 kg)  07/26/21 253 lb (114.8 kg)  07/25/21 252 lb (114.3 kg)    GEN: Well nourished, well developed in no acute distress HEENT: Normal, moist mucous membranes NECK: No JVD CARDIAC: regular rhythm, normal S1 and S2, no rubs or gallops. No murmur. VASCULAR: Radial and DP pulses 2+ bilaterally. No carotid bruits RESPIRATORY:  Clear to auscultation without rales, wheezing or rhonchi  ABDOMEN: Soft, non-tender, non-distended MUSCULOSKELETAL:  Ambulates independently SKIN: Warm and dry, no edema NEUROLOGIC:  Alert and oriented x 3. No focal neuro deficits noted. PSYCHIATRIC:  Normal affect    ASSESSMENT:    1. Tachycardia   2. Heart palpitations   3. Shortness of breath   4. Elevated blood-pressure reading without diagnosis of hypertension   5. Cardiac risk counseling   6. Counseling on health promotion and disease prevention    PLAN:    Palpitations Shortness of breath -will get monitor to evaluate for arrhythmia -sinus tachycardia on ECG    Elevated blood pressure: pending bariatric surgery. Follow up post op to make sure readings have improved.  Cardiac risk counseling and prevention recommendations: -recommend heart healthy/Mediterranean diet, with whole grains, fruits, vegetable, fish, lean meats, nuts, and olive oil. Limit salt. -recommend moderate  walking, 3-5 times/week for 30-50 minutes each session. Aim for at least 150 minutes.week. Goal should be pace of 3 miles/hours, or walking 1.5 miles in 30 minutes -recommend avoidance of tobacco products. Avoid excess alcohol. -ASCVD risk score: The 10-year ASCVD risk score (Arnett DK, et al., 2019) is: 3.1%   Values used to calculate the score:     Age: 46 years     Sex: Female     Is Non-Hispanic African American: Yes     Diabetic: No     Tobacco smoker: No     Systolic Blood Pressure: 301 mmHg     Is BP treated: Yes     HDL Cholesterol: 46  mg/dL     Total Cholesterol: 215 mg/dL    Plan for follow up: 1 month or sooner as needed.  Buford Dresser, MD, PhD, [facility]    Medication Adjustments/Labs and Tests Ordered: Current medicines are reviewed at length with the patient today.  Concerns regarding medicines are outlined above.   Orders Placed This Encounter  Procedures   LONG TERM MONITOR (3-14 DAYS)   EKG 12-Lead   No orders of the defined types were placed in this encounter.   Patient Instructions  Medication Instructions:  Your Physician recommend you continue on your current medication as directed.    *If you need a refill on your cardiac medications before your next appointment, please call your pharmacy*   Lab Work: None ordered today   Testing/Procedures: Our physician has recommended that you wear an  [place] monitor. The Zio patch cardiac monitor continuously records heart rhythm data for up to 14 days, this is for patients being evaluated for multiple types heart rhythms. For the first 24 hours post application, please avoid getting the Zio monitor wet in the shower or by excessive sweating during exercise. After that, feel free to carry on with regular activities. Keep soaps and lotions away from the ZIO XT Patch.     Follow-Up: At [facility], you and your health needs are our priority.  As part of our continuing  mission to provide you with exceptional heart care, we have created designated Provider Care Teams.  These Care Teams include your primary Cardiologist (physician) and Advanced Practice Providers (APPs -  Physician Assistants and Nurse Practitioners) who all work together to provide you with the care you need, when you need it.  We recommend signing up for the patient portal called "MyChart".  Sign up information is provided on this After Visit Summary.  MyChart is used to connect with patients for Virtual Visits (Telemedicine).  Patients are able to view lab/test results, encounter notes, upcoming appointments, etc.  Non-urgent messages can be sent to your provider as well.   To learn more about what you can do with MyChart, go to [email or web address].    Your next appointment:   4 -6week(s)  The format for your next appointment:   In Person  Provider:   Buford Dresser, MD   [place] Instructions  Your physician has requested you wear a ZIO patch monitor for 7 days.  This is a single patch monitor. [employer] supplies one patch monitor per enrollment. Additional stickers are not available. Please do not apply patch if you will be having a Nuclear Stress Test,  Echocardiogram, Cardiac CT, MRI, or Chest Xray during the period you would be wearing the  monitor. The patch cannot be worn during these tests. You cannot remove and re-apply the  ZIO XT patch monitor.  Your ZIO patch monitor will be mailed 3 day USPS to your address on file. It may take 3-5 days  to receive your monitor after you have been enrolled.  Once you have received your monitor, please review the enclosed instructions. Your monitor  has already been registered assigning a specific monitor serial # to you.  Billing and Patient Assistance Program Information  We have supplied [employer] with any of your insurance information on file for billing purposes. [employer] offers a sliding scale Patient  Assistance Program for patients that do not have  insurance, or whose insurance does not completely cover the cost of the ZIO monitor.  You must apply for the Patient Assistance Program to qualify for this discounted rate.  To apply, please call [employer] at [phone], select option 4, select option 2, ask to apply for  Patient Assistance Program. [name] will ask your household income, and how many people  are in your household. They will quote your out-of-pocket cost based on that information.  [employer] will also be able to set up a 57-month, interest-free payment plan if needed.  Applying the monitor   Shave hair from upper left chest.  Hold abrader disc by orange tab. Rub abrader in 40 strokes over the upper left chest as  indicated in your monitor instructions.  Clean area with 4 enclosed alcohol pads. Let dry.  Apply patch as indicated in monitor instructions. Patch will be placed under collarbone on left  side of chest with arrow pointing upward.  Rub patch adhesive wings for 2 minutes. Remove white label marked "1". Remove the white  label marked "2". Rub patch adhesive wings for 2 additional minutes.  While looking in a mirror, press and release button in center of patch. A small green light will  flash 3-4 times. This will be your only indicator that the monitor has been turned on.  Do not shower for the first 24 hours. You may shower after the first 24 hours.  Press the button if you feel a symptom. You will hear a small click. Record Date, Time and  Symptom in the Patient Logbook.  When you are ready to remove the patch, follow instructions on the last 2 pages of Patient  Logbook. Stick patch monitor onto the last page of Patient Logbook.  Place Patient Logbook in the blue and white box. Use locking tab on box and tape box closed  securely. The blue and white box has prepaid postage on it. Please place it in the mailbox as  soon as possible. Your physician should have your test  results approximately 7 days after the  monitor has been mailed back to [facility].  Call [place] at [phone] if you have questions regarding  your ZIO XT patch monitor. Call them immediately if you see an orange light blinking on your  monitor.  If your monitor falls off in less than 4 days, contact our Monitor department at [phone].  If your monitor becomes loose or falls off after 4 days call [employer] at [phone] for  suggestions on securing your monitor     I,Mathew Stumpf,acting as a scribe for Buford Dresser, MD.,have documented all relevant documentation on the behalf of Buford Dresser, MD,as directed by  Buford Dresser, MD while in the presence of Buford Dresser, MD.   I, Buford Dresser, MD, have reviewed all documentation for this visit. The documentation on 09/03/21 for the exam, diagnosis, procedures, and orders are all accurate and complete.   Signed, Buford Dresser, MD PhD 08/01/2021    [place]

## 2021-08-01 NOTE — Patient Instructions (Signed)
Medication Instructions:  Your Physician recommend you continue on your current medication as directed.    *If you need a refill on your cardiac medications before your next appointment, please call your pharmacy*   Lab Work: None ordered today   Testing/Procedures: Our physician has recommended that you wear an  Shannon [name]. The Zio patch cardiac [name] continuously records heart rhythm data for up to 14 days, this is for patients being evaluated for multiple types heart rhythms. For the first 24 hours post application, please avoid getting the Zio [name] wet in the shower or by excessive sweating during exercise. After that, feel free to carry on with regular activities. Keep soaps and lotions away from the ZIO XT Patch.     Follow-Up: At [facility], you and your health needs are our priority.  As part of our continuing mission to provide you with exceptional heart care, we have created designated Provider Care Teams.  These Care Teams include your primary Cardiologist (physician) and Advanced Practice Providers (APPs -  Physician Assistants and Nurse Practitioners) who all work together to provide you with the care you need, when you need it.  We recommend signing up for the patient portal called "MyChart".  Sign up information is provided on this After Visit Summary.  MyChart is used to connect with patients for Virtual Visits (Telemedicine).  Patients are able to view lab/test results, encounter notes, upcoming appointments, etc.  Non-urgent messages can be sent to your provider as well.   To learn more about what you can do with MyChart, go to [email or web address].    Your next appointment:   4 -6week(s)  The format for your next appointment:   In Person  Provider:   Buford Dresser, MD   [place] Instructions  Your physician has requested you wear a ZIO patch [name] for 7 days.  This is a single patch [name]. [employer] supplies one  patch [name] per enrollment. Additional stickers are not available. Please do not apply patch if you will be having a Nuclear Stress Test,  Echocardiogram, Cardiac CT, MRI, or Chest Xray during the period you would be wearing the  [name]. The patch cannot be worn during these tests. You cannot remove and re-apply the  ZIO XT patch [name].  Your ZIO patch [name] will be mailed 3 day USPS to your address on file. It may take 3-5 days  to receive your [name] after you have been enrolled.  Once you have received your [name], please review the enclosed instructions. Your [name]  has already been registered assigning a specific [name] serial # to you.  Billing and Patient Assistance Program Information  We have supplied [employer] with any of your insurance information on file for billing purposes. [employer] offers a sliding scale Patient Assistance Program for patients that do not have  insurance, or whose insurance does not completely cover the cost of the ZIO [name].  You must apply for the Patient Assistance Program to qualify for this discounted rate.  To apply, please call [employer] at [phone], select option 4, select option 2, ask to apply for  Patient Assistance Program. Shannon [name] will ask your household income, and how many people  are in your household. They will quote your out-of-pocket cost based on that information.  [employer] will also be able to set up a 36-month, interest-free payment plan if needed.  Applying the [name]   Shave hair from upper left chest.  Hold abrader disc by orange  tab. Rub abrader in 40 strokes over the upper left chest as  indicated in your [name] instructions.  Clean area with 4 enclosed alcohol pads. Let dry.  Apply patch as indicated in [name] instructions. Patch will be placed under collarbone on left  side of chest with arrow pointing upward.  Rub patch adhesive wings for 2 minutes. Remove white label marked "1". Remove the white  label marked  "2". Rub patch adhesive wings for 2 additional minutes.  While looking in a mirror, press and release button in center of patch. A small green light will  flash 3-4 times. This will be your only indicator that the [name] has been turned on.  Do not shower for the first 24 hours. You may shower after the first 24 hours.  Press the button if you feel a symptom. You will hear a small click. Record Date, Time and  Symptom in the Patient Logbook.  When you are ready to remove the patch, follow instructions on the last 2 pages of Patient  Logbook. Stick patch [name] onto the last page of Patient Logbook.  Place Patient Logbook in the blue and white box. Use locking tab on box and tape box closed  securely. The blue and white box has prepaid postage on it. Please place it in the mailbox as  soon as possible. Your physician should have your test results approximately 7 days after the  [name] has been mailed back to [facility].  Call [place] at [phone] if you have questions regarding  your ZIO XT patch [name]. Call them immediately if you see an orange light blinking on your  [name].  If your [name] falls off in less than 4 days, contact our [name] department at [phone].  If your [name] becomes loose or falls off after 4 days call [employer] at [phone] for  suggestions on securing your [name]

## 2021-08-21 ENCOUNTER — Encounter: Payer: BC Managed Care – PPO | Admitting: Skilled Nursing Facility1

## 2021-08-21 ENCOUNTER — Other Ambulatory Visit: Payer: Self-pay

## 2021-08-21 DIAGNOSIS — E6609 Other obesity due to excess calories: Secondary | ICD-10-CM

## 2021-08-21 NOTE — Progress Notes (Signed)
Pre-Operative Nutrition Class:    Patient was seen on 08/21/2021 for Pre-Operative Bariatric Surgery Education [facility]    Surgery date: 09/26/2021 Surgery type: sleeve Start weight at [facility]: 253 Weight today: 257.9  Samples given per MNT protocol. Patient educated on appropriate usage: Ensure max exp: December 20, 2021 Ensure max lot: [id number]  Chewable bariatric advantage: advanced multi EA exp: 08/23 Chewable bariatric advantage: advanced multi EA lot: [id number]  Bariatric advantage calcium citrate exp: 02/23 Bariatric advantage calcium citrate lot: [id number]   The following the learning objectives were met by the patient during this course: Identify Pre-Op Dietary Goals and will begin 2 weeks pre-operatively Identify appropriate sources of fluids and proteins  State protein recommendations and appropriate sources pre and post-operatively Identify Post-Operative Dietary Goals and will follow for 2 weeks post-operatively Identify appropriate multivitamin and calcium sources Describe the need for physical activity post-operatively and will follow MD recommendations State when to call healthcare provider regarding medication questions or post-operative complications When having a diagnosis of diabetes understanding hypoglycemia symptoms and the inclusion of 1 complex carbohydrate per meal  Handouts given during class include: Pre-Op Bariatric Surgery Diet Handout Protein Shake Handout Post-Op Bariatric Surgery Nutrition Handout BELT Program Information Flyer Support Group Information Flyer [facility] Bariatric Supplements Price List  Follow-Up Plan: Patient will follow-up at [facility] 2 weeks post operatively for diet advancement per MD.  

## 2021-08-23 ENCOUNTER — Ambulatory Visit (INDEPENDENT_AMBULATORY_CARE_PROVIDER_SITE_OTHER): Payer: BC Managed Care – PPO | Admitting: Internal Medicine

## 2021-08-23 ENCOUNTER — Encounter: Payer: Self-pay | Admitting: Internal Medicine

## 2021-08-23 ENCOUNTER — Other Ambulatory Visit: Payer: Self-pay

## 2021-08-23 VITALS — BP 122/74 | HR 100 | Temp 98.5°F | Ht 65.5 in | Wt 255.2 lb

## 2021-08-23 DIAGNOSIS — I10 Essential (primary) hypertension: Secondary | ICD-10-CM | POA: Diagnosis not present

## 2021-08-23 DIAGNOSIS — Z2821 Immunization not carried out because of patient refusal: Secondary | ICD-10-CM

## 2021-08-23 DIAGNOSIS — Z0000 Encounter for general adult medical examination without abnormal findings: Secondary | ICD-10-CM

## 2021-08-23 DIAGNOSIS — R61 Generalized hyperhidrosis: Secondary | ICD-10-CM | POA: Diagnosis not present

## 2021-08-23 DIAGNOSIS — F3289 Other specified depressive episodes: Secondary | ICD-10-CM | POA: Diagnosis not present

## 2021-08-23 DIAGNOSIS — F5102 Adjustment insomnia: Secondary | ICD-10-CM

## 2021-08-23 DIAGNOSIS — D649 Anemia, unspecified: Secondary | ICD-10-CM

## 2021-08-23 DIAGNOSIS — F5101 Primary insomnia: Secondary | ICD-10-CM

## 2021-08-23 DIAGNOSIS — Z6841 Body Mass Index (BMI) 40.0 and over, adult: Secondary | ICD-10-CM

## 2021-08-23 DIAGNOSIS — F988 Other specified behavioral and emotional disorders with onset usually occurring in childhood and adolescence: Secondary | ICD-10-CM | POA: Diagnosis not present

## 2021-08-23 DIAGNOSIS — R8290 Unspecified abnormal findings in urine: Secondary | ICD-10-CM

## 2021-08-23 HISTORY — DX: Anemia, unspecified: D64.9

## 2021-08-23 LAB — POCT URINALYSIS DIPSTICK
Blood, UA: NEGATIVE
Glucose, UA: NEGATIVE
Nitrite, UA: NEGATIVE
Protein, UA: POSITIVE — AB
Spec Grav, UA: 1.025 (ref 1.010–1.025)
Urobilinogen, UA: 0.2 E.U./dL
pH, UA: 6 (ref 5.0–8.0)

## 2021-08-23 LAB — POCT UA - MICROALBUMIN
Albumin/Creatinine Ratio, Urine, POC: <30
Creatinine, POC: 300 mg/dL
Microalbumin Ur, POC: 30 mg/L

## 2021-08-23 NOTE — Patient Instructions (Signed)
Health Maintenance, Female Adopting a healthy lifestyle and getting preventive care are important in promoting health and wellness. Ask your health care provider about: The right schedule for you to have regular tests and exams. Things you can do on your own to prevent diseases and keep yourself healthy. What should I know about diet, weight, and exercise? Eat a healthy diet  Eat a diet that includes plenty of vegetables, fruits, low-fat dairy products, and lean protein. Do not eat a lot of foods that are high in solid fats, added sugars, or sodium. Maintain a healthy weight Body mass index (BMI) is used to identify weight problems. It estimates body fat based on height and weight. Your health care provider can help determine your BMI and help you achieve or maintain a healthy weight. Get regular exercise Get regular exercise. This is one of the most important things you can do for your health. Most adults should: Exercise for at least 150 minutes each week. The exercise should increase your heart rate and make you sweat (moderate-intensity exercise). Do strengthening exercises at least twice a week. This is in addition to the moderate-intensity exercise. Spend less time sitting. Even light physical activity can be beneficial. Watch cholesterol and blood lipids Have your blood tested for lipids and cholesterol at 40 years of age, then have this test every 5 years. Have your cholesterol levels checked more often if: Your lipid or cholesterol levels are high. You are older than 40 years of age. You are at high risk for heart disease. What should I know about cancer screening? Depending on your health history and family history, you may need to have cancer screening at various ages. This may include screening for: Breast cancer. Cervical cancer. Colorectal cancer. Skin cancer. Lung cancer. What should I know about heart disease, diabetes, and high blood pressure? Blood pressure and heart  disease High blood pressure causes heart disease and increases the risk of stroke. This is more likely to develop in people who have high blood pressure readings, are of African descent, or are overweight. Have your blood pressure checked: Every 3-5 years if you are 18-39 years of age. Every year if you are 40 years old or older. Diabetes Have regular diabetes screenings. This checks your fasting blood sugar level. Have the screening done: Once every three years after age 40 if you are at a normal weight and have a low risk for diabetes. More often and at a younger age if you are overweight or have a high risk for diabetes. What should I know about preventing infection? Hepatitis B If you have a higher risk for hepatitis B, you should be screened for this virus. Talk with your health care provider to find out if you are at risk for hepatitis B infection. Hepatitis C Testing is recommended for: Everyone born from 1945 through 1965. Anyone with known risk factors for hepatitis C. Sexually transmitted infections (STIs) Get screened for STIs, including gonorrhea and chlamydia, if: You are sexually active and are younger than 40 years of age. You are older than 40 years of age and your health care provider tells you that you are at risk for this type of infection. Your sexual activity has changed since you were last screened, and you are at increased risk for chlamydia or gonorrhea. Ask your health care provider if you are at risk. Ask your health care provider about whether you are at high risk for HIV. Your health care provider may recommend a prescription medicine   to help prevent HIV infection. If you choose to take medicine to prevent HIV, you should first get tested for HIV. You should then be tested every 3 months for as long as you are taking the medicine. Pregnancy If you are about to stop having your period (premenopausal) and you may become pregnant, seek counseling before you get  pregnant. Take 400 to 800 micrograms (mcg) of folic acid every day if you become pregnant. Ask for birth control (contraception) if you want to prevent pregnancy. Osteoporosis and menopause Osteoporosis is a disease in which the bones lose minerals and strength with aging. This can result in bone fractures. If you are 65 years old or older, or if you are at risk for osteoporosis and fractures, ask your health care provider if you should: Be screened for bone loss. Take a calcium or vitamin D supplement to lower your risk of fractures. Be given hormone replacement therapy (HRT) to treat symptoms of menopause. Follow these instructions at home: Lifestyle Do not use any products that contain nicotine or tobacco, such as cigarettes, e-cigarettes, and chewing tobacco. If you need help quitting, ask your health care provider. Do not use street drugs. Do not share needles. Ask your health care provider for help if you need support or information about quitting drugs. Alcohol use Do not drink alcohol if: Your health care provider tells you not to drink. You are pregnant, may be pregnant, or are planning to become pregnant. If you drink alcohol: Limit how much you use to 0-1 drink a day. Limit intake if you are breastfeeding. Be aware of how much alcohol is in your drink. In the [place] one drink equals one 12 oz bottle of beer (355 mL), one 5 oz glass of wine (148 mL), or one 1 oz glass of hard liquor (44 mL). General instructions Schedule regular health, dental, and eye exams. Stay current with your vaccines. Tell your health care provider if: You often feel depressed. You have ever been abused or do not feel safe at home. Summary Adopting a healthy lifestyle and getting preventive care are important in promoting health and wellness. Follow your health care provider's instructions about healthy diet, exercising, and getting tested or screened for diseases. Follow your health care provider's  instructions on monitoring your cholesterol and blood pressure. This information is not intended to replace advice given to you by your health care provider. Make sure you discuss any questions you have with your health care provider. Document Revised: 12/16/2020 Document Reviewed: 10/01/2018 Elsevier Patient Education  2022 [employer] Inc.  

## 2021-08-23 NOTE — Progress Notes (Signed)
I,Katawbba Wiggins,acting as a Education administrator for Maximino Greenland, MD.,have documented all relevant documentation on the behalf of Maximino Greenland, MD,as directed by  Maximino Greenland, MD while in the presence of Maximino Greenland, MD.  This visit occurred during the SARS-CoV-2 public health emergency.  Safety protocols were in place, including screening questions prior to the visit, additional usage of staff PPE, and extensive cleaning of exam room while observing appropriate contact time as indicated for disinfecting solutions.  Subjective:     Patient ID: Shannon [name] , female    DOB: [birth year] , 40 y.o.   MRN: [id number]   Chief Complaint  Patient presents with   Annual Exam    HPI  She is here today for a full physical examination. She has her pap smears performed by Dr. Everett Graff next appt for pap is 08/23/2021.    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   Vitamin D deficiency      Family History  Problem Relation Age of Onset   Hypertension Mother    Diabetes Mother    Mental illness Mother    Hyperlipidemia Mother    Stroke Mother    Depression Mother    Anxiety disorder Mother    Bipolar disorder Mother    Liver disease Mother    Sleep apnea Mother    Obesity Mother    Hypertension Father    Asthma Brother    Seizures Brother    Pancreatic cancer Maternal Aunt 48   Lung cancer Maternal Aunt        dx after 73; smoking hx   Liver cancer Maternal Uncle        dx after 74   Breast cancer Paternal Aunt        two paternal aunts, age 5s-40s   Ovarian cancer Paternal Aunt        two paternal  aunts, reported BRCA mutation   Colon polyps Paternal Uncle        two paternal uncles; more than 33 lifetime polyps   Heart disease Maternal Grandmother    Hypertension Maternal Grandmother    Breast cancer Maternal Grandmother        dx 30s-40s; d. 42   Ovarian cancer Paternal Grandmother    Ovarian cancer Other        PGM's mother   Breast cancer Cousin        dx 63s   Ovarian cancer Cousin        dx 71s     Current Outpatient Medications:    acetaminophen (TYLENOL) 500 MG tablet, Take 500 mg by mouth every 6 (six) hours as needed for headache (Cramps)., Disp: , Rfl:    albuterol (VENTOLIN HFA) 108 (90 Base) MCG/ACT inhaler, Inhale 2 puffs into the lungs every 6 (six) hours as needed for wheezing or shortness of breath., Disp: 1 each, Rfl: 3   Cholecalciferol (VITAMIN D3) 1.25 MG (50000 UT) CAPS, Take 50,000 Int'l Units by mouth once a week., Disp: , Rfl:  DULoxetine (CYMBALTA) 30 MG capsule, Take 30 mg by mouth See admin instructions. Take with 60 mg for a total of 90 mg at bedtime, Disp: , Rfl:    DULoxetine (CYMBALTA) 60 MG capsule, Take 60 mg by mouth See admin instructions. Take with 30 mg for a total of 90 mg at bedtime, Disp: , Rfl:    propranolol (INDERAL) 20 MG tablet, Take 1 tablet (20 mg total) by mouth 2 (two) times daily. (Patient taking differently: Take 40 mg by mouth at bedtime.), Disp: 60 tablet, Rfl: 3   VYVANSE 70 MG capsule, Take 70 mg by mouth every morning., Disp: , Rfl:    baclofen (LIORESAL) 10 MG tablet, Take 0.5-1 tablets (5-10 mg total) by mouth 3 (three) times daily as needed for muscle spasms., Disp: 90 each, Rfl: 5   Allergies  Allergen Reactions   Ceftriaxone Hives and Rash    No SOB - patient willing to try again   Diclofenac Rash   Fluconazole Hives    Raw rash on abdomen and she had sores and swelling in mouth  Other reaction(s): Angioedema Raw rash on abdomen and she had sores and swelling in mouth    Pineapple Shortness Of Breath   Latex  Rash   Bactrim [Sulfamethoxazole-Trimethoprim] Hives   Ceftriaxone Sodium Rash    No SOB - patient willing to try again     The patient states she uses none for birth control. Last LMP was Patient's last menstrual period was 08/07/2021.. . Negative for: breast discharge, breast lump(s), breast pain and breast self exam. Associated symptoms include abnormal vaginal bleeding. Pertinent negatives include abnormal bleeding (hematology), anxiety, decreased libido, depression, difficulty falling sleep, dyspareunia, history of infertility, nocturia, sexual dysfunction, sleep disturbances, urinary incontinence, urinary urgency, vaginal discharge and vaginal itching. Diet regular.The patient states her exercise level is  minimal.  . The patient's tobacco use is:  Social History   Tobacco Use  Smoking Status Never  Smokeless Tobacco Never  . She has been exposed to passive smoke. The patient's alcohol use is:  Social History   Substance and Sexual Activity  Alcohol Use No   Comment: occasionally   Review of Systems  Constitutional: Negative.   HENT: Negative.    Eyes: Negative.   Respiratory: Negative.    Cardiovascular:  Positive for palpitations.  Gastrointestinal: Negative.   Endocrine: Negative.        Still with sweating spells. Unable to determine what triggers her sx. Appears to occur spontaneously.   Genitourinary: Negative.   Musculoskeletal: Negative.   Skin: Negative.   Allergic/Immunologic: Negative.   Neurological: Negative.   Hematological: Negative.   Psychiatric/Behavioral: Negative.      Today's Vitals   08/23/21 1514  BP: 122/74  Pulse: 100  Temp: 98.5 F (36.9 C)  Weight: 255 lb 3.2 oz (115.8 kg)  Height: 5' 5.5" (1.664 m)   Body mass index is 41.82 kg/m.  Wt Readings from Last 3 Encounters:  08/25/21 254 lb (115.2 kg)  08/23/21 255 lb 3.2 oz (115.8 kg)  08/21/21 257 lb 14.4 oz (117 kg)    BP Readings from Last 3 Encounters:  08/25/21 (!) 130/91   08/23/21 122/74  08/01/21 (!) 134/94    Objective:  Physical Exam Vitals and nursing note reviewed.  Constitutional:      Appearance: Normal appearance. She is obese.  HENT:     Head: Normocephalic and atraumatic.     Right Ear: Tympanic membrane, ear canal and external ear normal.  Left Ear: Tympanic membrane, ear canal and external ear normal.     Nose:     Comments: Masked     Mouth/Throat:     Comments: Masked  Eyes:     Extraocular Movements: Extraocular movements intact.     Conjunctiva/sclera: Conjunctivae normal.     Pupils: Pupils are equal, round, and reactive to light.  Cardiovascular:     Rate and Rhythm: Regular rhythm. Tachycardia present.     Pulses: Normal pulses.     Heart sounds: Normal heart sounds.  Pulmonary:     Effort: Pulmonary effort is normal.     Breath sounds: Normal breath sounds.  Chest:  Breasts:    Tanner Score is 5.     Right: Normal.     Left: Normal.  Abdominal:     General: Bowel sounds are normal.     Palpations: Abdomen is soft.     Comments: Obese, soft  Genitourinary:    Comments: deferred Musculoskeletal:        General: Normal range of motion.     Cervical back: Normal range of motion and neck supple.  Skin:    General: Skin is warm and dry.  Neurological:     General: No focal deficit present.     Mental Status: She is alert and oriented to person, place, and time.  Psychiatric:        Mood and Affect: Mood normal.        Behavior: Behavior normal.        Assessment And Plan:     1. Routine general medical examination at a health care facility Comments: A full exam was performed. Importance of monthly self breast exams was discussed with the patient. PATIENT IS ADVISED TO GET 30-45 MINUTES REGULAR EXERCISE NO LESS THAN FOUR TO FIVE DAYS PER WEEK - BOTH WEIGHTBEARING EXERCISES AND AEROBIC ARE RECOMMENDED.  PATIENT IS ADVISED TO FOLLOW A HEALTHY DIET WITH AT LEAST SIX FRUITS/VEGGIES PER DAY, DECREASE INTAKE OF RED  MEAT, AND TO INCREASE FISH INTAKE TO TWO DAYS PER WEEK.  MEATS/FISH SHOULD NOT BE FRIED, BAKED OR BROILED IS PREFERABLE.  IT IS ALSO IMPORTANT TO CUT BACK ON YOUR SUGAR INTAKE. PLEASE AVOID ANYTHING WITH ADDED SUGAR, CORN SYRUP OR OTHER SWEETENERS. IF YOU MUST USE A SWEETENER, YOU CAN TRY STEVIA. IT IS ALSO IMPORTANT TO AVOID ARTIFICIALLY SWEETENERS AND DIET BEVERAGES. LASTLY, I SUGGEST WEARING SPF 50 SUNSCREEN ON EXPOSED PARTS AND ESPECIALLY WHEN IN THE DIRECT SUNLIGHT FOR AN EXTENDED PERIOD OF TIME.  PLEASE AVOID FAST FOOD RESTAURANTS AND INCREASE YOUR WATER INTAKE.  - CBC - Hemoglobin A1c - Liver Profile  2. Essential hypertension, benign Comments: Chronic, well controlled. EKG performed, NSR w/o acute changes. She is encouraged to follow low sodium diet. She will f/u in 6 months.  - POCT Urinalysis Dipstick (81002) - POCT UA - Microalbumin  3. Other depression Comments: She is followed by Stephannie Peters, NP. She is currently on Cymbalta 42m daily.   4. Diaphoresis Comments: Her sx are persistent. Associated with tachycardia, insomnia - Will refer to Endocrine for further eval of possible adrenal gland disorder. However, it is also possible that her sx are due to concomitant use of high dose duloxetine 935malong with Vyvanse 7018mI am hesitant to adjust meds since she is managed by Psych; however, will consider decreasing duloxetine to 31m71mter review of labs. 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 She agrees to ADD specialist for further evaluation.  - Ambulatory referral to Internal Medicine  6. Abnormal urine Comments: I will send urine off for culture. She is encouraged to stay well hydrated.  - Urine Culture  7. Adjustment insomnia Comments: Will consider use of temazepam. Zolpidem caused her to sleep eat per her Mom's reports.   8. Class 3 severe  obesity due to excess calories without serious comorbidity with body mass index (BMI) of 40.0 to 44.9 in adult ([facility] Comments: BMI 41. Planning to move forward with bariatric surgery, she has been approved to schedule a surgical date.   9. Influenza vaccination declined  Patient was given opportunity to ask questions. Patient verbalized understanding of the plan and was able to repeat key elements of the plan. All questions were answered to their satisfaction.   I, RMaximino Greenland MD, have reviewed all documentation for this visit. The documentation on 08/23/21 for the exam, diagnosis, procedures, and orders are all accurate and complete.  THE PATIENT IS ENCOURAGED TO PRACTICE SOCIAL DISTANCING DUE TO THE COVID-19 PANDEMIC.

## 2021-08-24 LAB — CBC
Hematocrit: 35.8 % (ref 34.0–46.6)
Hemoglobin: 11.9 g/dL (ref 11.1–15.9)
MCH: 25.4 pg — ABNORMAL LOW (ref 26.6–33.0)
MCHC: 33.2 g/dL (ref 31.5–35.7)
MCV: 77 fL — ABNORMAL LOW (ref 79–97)
Platelets: 261 10*3/uL (ref 150–450)
RBC: 4.68 x10E6/uL (ref 3.77–5.28)
RDW: 14.1 % (ref 11.7–15.4)
WBC: 7.2 10*3/uL (ref 3.4–10.8)

## 2021-08-24 LAB — HEPATIC FUNCTION PANEL
ALT: 15 IU/L (ref 0–32)
AST: 19 IU/L (ref 0–40)
Albumin: 4.1 g/dL (ref 3.8–4.8)
Alkaline Phosphatase: 111 IU/L (ref 44–121)
Bilirubin Total: 0.3 mg/dL (ref 0.0–1.2)
Bilirubin, Direct: <0.1 mg/dL (ref 0.00–0.40)
Total Protein: 6.6 g/dL (ref 6.0–8.5)

## 2021-08-24 LAB — HEMOGLOBIN A1C
Est. average glucose Bld gHb Est-mCnc: 111 mg/dL
Hgb A1c MFr Bld: 5.5 % (ref 4.8–5.6)

## 2021-08-24 LAB — HM PAP SMEAR

## 2021-08-25 ENCOUNTER — Other Ambulatory Visit: Payer: Self-pay

## 2021-08-25 ENCOUNTER — Encounter: Payer: Self-pay | Admitting: Physical Medicine and Rehabilitation

## 2021-08-25 ENCOUNTER — Encounter
Payer: BC Managed Care – PPO | Attending: Physical Medicine and Rehabilitation | Admitting: Physical Medicine and Rehabilitation

## 2021-08-25 VITALS — BP 130/91 | HR 104 | Temp 98.2°F | Ht 65.5 in | Wt 254.0 lb

## 2021-08-25 DIAGNOSIS — M7918 Myalgia, other site: Secondary | ICD-10-CM | POA: Insufficient documentation

## 2021-08-25 DIAGNOSIS — D447 Neoplasm of uncertain behavior of aortic body and other paraganglia: Secondary | ICD-10-CM | POA: Insufficient documentation

## 2021-08-25 DIAGNOSIS — R000 Tachycardia, unspecified: Secondary | ICD-10-CM | POA: Diagnosis present

## 2021-08-25 DIAGNOSIS — G8929 Other chronic pain: Secondary | ICD-10-CM | POA: Insufficient documentation

## 2021-08-25 DIAGNOSIS — L749 Eccrine sweat disorder, unspecified: Secondary | ICD-10-CM | POA: Insufficient documentation

## 2021-08-25 DIAGNOSIS — R61 Generalized hyperhidrosis: Secondary | ICD-10-CM | POA: Diagnosis present

## 2021-08-25 DIAGNOSIS — M545 Low back pain, unspecified: Secondary | ICD-10-CM | POA: Insufficient documentation

## 2021-08-25 MED ORDER — BACLOFEN 10 MG PO TABS: 5.0000 mg | ORAL_TABLET | Freq: Three times a day (TID) | ORAL | 5 refills | Status: DC | PRN

## 2021-08-25 NOTE — Patient Instructions (Signed)
  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   Suggest not swedish massage- only myofascial release- can get massage-  had laminectomy at 1 level only- so doesn't need fusion. Mainly just had a mass outside the spinal cord removed.   2.  Baclofen 5-10 mg 3x/day as needed for muscle tightness.    3. Didn't have Sx's of HA, high BP, tachycardia PRIOR to paraganglionoma- and per Dr Mickeal Skinner are Sx's of paraganglionoma but it's grossly gone based on MRI, so it doesn't make sense.    4. Hasn't gone back to work til 12/22- and might start on half days- per Dr Mickeal Skinner.    5. PCP wants her to decrease Vyvanse- however pt is concerned about working with poor concentration has appointment with psychiatry to discuss possible options.   Suggest trying to do this now, not after starts back to work.  So can prove or disprove if it's an issue.   6.  F/U in 3 months- - but call me and let me know how Baclofen is doing.

## 2021-08-25 NOTE — Progress Notes (Signed)
Subjective:    Patient ID: Shannon [name], female    DOB: September 12, 1981, 40 y.o.   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 Also has BMI of 41 and tachycardia Here for f/u on his paraplegia/on her LE weakness due to ependymoma/paraganglionoma.  Doing "OK"- Low back still painful- constant back pain- won't loosen up.    Trigger point injections were "terrible".  They made the pain worse, so doesn't want again.    Still taking the Duloxetine. Flexeril just made her sleepy- Doesn't have Rx for Baclofen. Liked Baclofen in hospital more than anything else.   Was asking about massage- and see if can get them.    Was denied by Endo, so PCP is sending referral to [facility] for this.   BP and HR are still labile- and still sweating- all over- so definitely not Autonomic dysreflexia- sweats on arms and legs as well as torso.  Anything can set it off. Describes a hot flash, but was told isn't menopausal or even close.   HA's- thinks might be coming from high BP or lack of sleep at night.  When has HA, BP will be "a little high"- but isn't aware of how high it gets- mother reads it.    Dr Mickeal Skinner- has appointment in late December- increased propranolol- but hasn't fixed the problem.      Pain Inventory Average Pain 6 Pain Right Now 5 My pain is constant, tingling, and tightness  In the last 24 hours, has pain interfered with the following? General activity 0 Relation with others 0 Enjoyment of life 0 What TIME of day is your pain at its worst? morning , daytime, evening, and night Sleep (in general) Poor  Pain is worse with: sitting and standing Pain improves with: heat/ice Relief from Meds: 2  Family History  Problem Relation Age of Onset   Hypertension Mother    Diabetes Mother     Mental illness Mother    Hyperlipidemia Mother    Stroke Mother    Depression Mother    Anxiety disorder Mother    Bipolar disorder Mother    Liver disease Mother    Sleep apnea Mother    Obesity Mother    Hypertension Father    Asthma Brother    Seizures Brother    Pancreatic cancer Maternal Aunt 46   Lung cancer Maternal Aunt        dx after 76; smoking hx   Liver cancer Maternal Uncle        dx after 3   Breast cancer Paternal Aunt        two paternal aunts, age 46s-40s   Ovarian cancer Paternal Aunt        two paternal aunts, reported BRCA mutation   Colon polyps Paternal Uncle        two paternal uncles; more than 67 lifetime polyps   Heart disease Maternal Grandmother    Hypertension Maternal Grandmother    Breast cancer Maternal Grandmother        dx 30s-40s; d. 66   Ovarian cancer Paternal Grandmother    Ovarian cancer Other        PGM's mother   Breast cancer Cousin        dx 4s   Ovarian cancer Cousin  dx 47s   Social History   Socioeconomic History   Marital status: Legally Separated    Spouse name: Not on file   Number of children: Not on file   Years of education: Not on file   Highest education level: Not on file  Occupational History   Not on file  Tobacco Use   Smoking status: Never   Smokeless tobacco: Never  Vaping Use   Vaping Use: Never used  Substance and Sexual Activity   Alcohol use: No    Comment: occasionally   Drug use: No   Sexual activity: Yes    Birth control/protection: None  Other Topics Concern   Not on file  Social History Narrative   Not on file   Social Determinants of Health   Financial Resource Strain: Not on file  Food Insecurity: Not on file  Transportation Needs: Not on file  Physical Activity: Not on file  Stress: Not on file  Social Connections: Not on file   Past Surgical History:  Procedure Laterality Date   BACK SURGERY     BIOPSY  07/25/2021   Procedure: BIOPSY;  Surgeon: Felicie Morn, MD;  Location: [facility];  Service: General;;   [place] N/A 02/03/2014   Procedure: DILATATION & CURETTAGE/HYSTEROSCOPY ;  Surgeon: Delice Lesch, MD;  Location: [facility];  Service: Gynecology;  Laterality: N/A;   DILATION AND CURETTAGE OF UTERUS     ESOPHAGOGASTRODUODENOSCOPY N/A 07/25/2021   Procedure: ESOPHAGOGASTRODUODENOSCOPY (EGD);  Surgeon: Felicie Morn, MD;  Location: [facility];  Service: General;  Laterality: N/A;   LAMINECTOMY N/A 03/17/2021   Procedure: Laminectomy - Lumbar Four-Lumbar Five for Intradural mass;  Surgeon: Kary Kos, MD;  Location: [facility];  Service: Neurosurgery;  Laterality: N/A;  3C   MYOMECTOMY N/A 02/03/2014   Procedure: MYOMECTOMY ;  Surgeon: Delice Lesch, MD;  Location: [facility];  Service: Gynecology;  Laterality: N/A;   SALPINGECTOMY     TONSILLECTOMY     tonsils and adnoids removed     TUBAL LIGATION N/A 08/19/2015   Procedure: POST PARTUM TUBAL LIGATION;  Surgeon: Everett Graff, MD;  Location: [facility];  Service: Gynecology;  Laterality: N/A;   WISDOM TOOTH EXTRACTION     Past Surgical History:  Procedure Laterality Date   BACK SURGERY     BIOPSY  07/25/2021   Procedure: BIOPSY;  Surgeon: Felicie Morn, MD;  Location: [facility];  Service: General;;   [place] N/A 02/03/2014   Procedure: DILATATION & CURETTAGE/HYSTEROSCOPY ;  Surgeon: Delice Lesch, MD;  Location: [facility];  Service: Gynecology;  Laterality: N/A;   DILATION AND CURETTAGE OF UTERUS     ESOPHAGOGASTRODUODENOSCOPY N/A 07/25/2021   Procedure: ESOPHAGOGASTRODUODENOSCOPY (EGD);  Surgeon: Felicie Morn, MD;  Location: [facility];  Service: General;  Laterality: N/A;   LAMINECTOMY N/A 03/17/2021   Procedure: Laminectomy - Lumbar Four-Lumbar Five for Intradural mass;  Surgeon: Kary Kos, MD;  Location: [facility];  Service: Neurosurgery;  Laterality: N/A;  3C   MYOMECTOMY N/A  02/03/2014   Procedure: MYOMECTOMY ;  Surgeon: Delice Lesch, MD;  Location: [facility];  Service: Gynecology;  Laterality: N/A;   SALPINGECTOMY     TONSILLECTOMY     tonsils and adnoids removed     TUBAL LIGATION N/A 08/19/2015   Procedure: POST PARTUM TUBAL LIGATION;  Surgeon: Everett Graff, MD;  Location: [facility];  Service: Gynecology;  Laterality: N/A;   WISDOM  TOOTH EXTRACTION     Past Medical History:  Diagnosis Date   ADD (attention deficit disorder)    Anemia    Anxiety    Asthma    Back pain    Depression    Family history of breast cancer 04/28/2021   Family history of ovarian cancer 04/28/2021   Family history of pancreatic cancer 04/28/2021   Hyperlipidemia    Hypertension    Preeclampsia post delivery   Multiple food allergies    Obesity    Paraganglioma (HCC)    Lumbar   Vitamin D deficiency    BP (!) 130/91   Pulse (!) 104   Temp 98.2 F (36.8 C)   Ht 5' 5.5" (1.664 m)   Wt 254 lb (115.2 kg)   LMP 08/07/2021   SpO2 98%   BMI 41.62 kg/m   Opioid Risk Score:   Fall Risk Score:  `1  Depression screen PHQ 2/9  Depression screen [facility] 2/9 08/25/2021 06/12/2021 04/14/2021 12/27/2020 12/23/2019 07/08/2019 02/03/2019  Decreased Interest _0 0 0  Down, Depressed, Hopeless 1 1 0 3 1 0 0  PHQ - 2 Score _1 0 0  Altered sleeping - - _2 - -  Tired, decreased energy - - _3 - -  Change in appetite - - 1 0 1 - -  Feeling bad or failure about yourself  - - 0 0 0 - -  Trouble concentrating - - _4 - -  Moving slowly or fidgety/restless - - 1 0 1 - -  Suicidal thoughts - - 0 0 0 - -  PHQ-9 Score - - _5 - -  Difficult doing work/chores - - Very difficult Extremely dIfficult Somewhat difficult - -     Review of Systems  Constitutional: Negative.   HENT: Negative.    Eyes: Negative.   Respiratory: Negative.    Cardiovascular: Negative.   Gastrointestinal: Negative.   Endocrine: Negative.   Genitourinary: Negative.   Musculoskeletal:  Positive  for back pain.  Skin: Negative.   Allergic/Immunologic: Negative.   Neurological: Negative.   Hematological: Negative.   Psychiatric/Behavioral: Negative.    All other systems reviewed and are negative.     Objective:   Physical Exam Awake, alert, appropriate, walking with no assistive device, sitting on table; wearing low heels, NAD No sweating currently.  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   Suggest not swedish massage- only myofascial release- can get massage-  had laminectomy at 1 level only- so doesn't need fusion. Mainly just had a mass outside the spinal cord removed.   2.  Baclofen 5-10 mg 3x/day as needed for muscle tightness.    3. Didn't have Sx's of HA, high BP, tachycardia PRIOR to paraganglionoma- and per Dr Mickeal Skinner are Sx's of paraganglionoma but it's grossly gone based on MRI, so it doesn't make sense.    4. Hasn't gone back to work til 12/22- and might start on half days- per Dr Mickeal Skinner.    5. PCP wants her to decrease Vyvanse- however pt is concerned about  working with poor concentration has appointment with psychiatry to discuss possible options.   Suggest trying to do this now, not after starts back to work.  So can prove or disprove if it's an issue.   6.  F/U in 3 months- - but call me and let me know how Baclofen is doing.

## 2021-08-28 ENCOUNTER — Telehealth: Payer: Self-pay

## 2021-08-28 NOTE — Telephone Encounter (Signed)
I left a detailed message that Dr. Baird Cancer said her sweating/palpitations could possibly be due to interaction between duloxetine and vyvanse. she should contact Crystal, her psych provider. I also asked the pt to let me know that she got the message.

## 2021-08-29 LAB — URINE CULTURE

## 2021-08-30 ENCOUNTER — Encounter (HOSPITAL_BASED_OUTPATIENT_CLINIC_OR_DEPARTMENT_OTHER): Payer: Self-pay | Admitting: Cardiology

## 2021-08-30 ENCOUNTER — Encounter: Payer: Self-pay | Admitting: Internal Medicine

## 2021-08-30 ENCOUNTER — Encounter (HOSPITAL_BASED_OUTPATIENT_CLINIC_OR_DEPARTMENT_OTHER): Payer: Self-pay

## 2021-08-30 ENCOUNTER — Ambulatory Visit (INDEPENDENT_AMBULATORY_CARE_PROVIDER_SITE_OTHER): Payer: BC Managed Care – PPO | Admitting: Cardiology

## 2021-08-30 ENCOUNTER — Telehealth: Payer: Self-pay | Admitting: Cardiology

## 2021-08-30 ENCOUNTER — Other Ambulatory Visit: Payer: Self-pay

## 2021-08-30 VITALS — BP 136/88 | HR 89 | Ht 64.5 in | Wt 259.1 lb

## 2021-08-30 DIAGNOSIS — R002 Palpitations: Secondary | ICD-10-CM | POA: Diagnosis not present

## 2021-08-30 DIAGNOSIS — Z712 Person consulting for explanation of examination or test findings: Secondary | ICD-10-CM

## 2021-08-30 DIAGNOSIS — I10 Essential (primary) hypertension: Secondary | ICD-10-CM | POA: Diagnosis not present

## 2021-08-30 DIAGNOSIS — Z7189 Other specified counseling: Secondary | ICD-10-CM

## 2021-08-30 DIAGNOSIS — Z01810 Encounter for preprocedural cardiovascular examination: Secondary | ICD-10-CM | POA: Diagnosis not present

## 2021-08-30 NOTE — Progress Notes (Signed)
Cardiology Office Note:    Date:  08/30/2021   ID:  Shannon [name], DOB 1981/03/23, MRN [id number]  PCP:  Shannon Chard, MD  Cardiologist:  Shannon Lesches, MD  Referring MD: Shannon Chard, MD   CC: follow up   History of Present Illness:    Shannon [name] is a 40 y.o. female with a hx of ADD, anemia, anxiety, asthma, depression, hyperlipidemia, hypertension, obesity, and paraganglioma, who is seen for follow-up. I initially met her 08/01/2021 as a new consult at the request of Shannon Chard, MD for the evaluation and management of shortness of breath and tachycardia.  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 Currently, her palpitations are occurring randomly for a short duration. Sometimes she has associated mild chest pain or a "rush" of dizziness.  She is scheduled to undergo a gastric sleeve resection on 09/26/2021.  We reviewed her monitor results together at length today.   She denies any shortness of breath, headaches, syncope, orthopnea, PND, lower extremity edema or exertional symptoms.  Past Medical History:  Diagnosis Date   ADD (attention deficit disorder)    Anemia    Anxiety    Asthma    Back pain    Depression    Family history of breast cancer 04/28/2021   Family history of ovarian cancer 04/28/2021   Family history of pancreatic cancer 04/28/2021   Hyperlipidemia    Hypertension    Preeclampsia post delivery   Multiple food allergies    Obesity    Paraganglioma ([place])     Lumbar   Vitamin D deficiency     Past Surgical History:  Procedure Laterality Date   BACK SURGERY     BIOPSY  07/25/2021   Procedure: BIOPSY;  Surgeon: Shannon Morn, MD;  Location: [facility];  Service: General;;   [place] N/A 02/03/2014   Procedure: DILATATION & CURETTAGE/HYSTEROSCOPY ;  Surgeon: Shannon Lesch, MD;  Location: [facility];  Service: Gynecology;  Laterality: N/A;   DILATION AND CURETTAGE OF UTERUS     ESOPHAGOGASTRODUODENOSCOPY N/A 07/25/2021   Procedure: ESOPHAGOGASTRODUODENOSCOPY (EGD);  Surgeon: Shannon Morn, MD;  Location: [facility];  Service: General;  Laterality: N/A;   LAMINECTOMY N/A 03/17/2021   Procedure: Laminectomy - Lumbar Four-Lumbar Five for Intradural mass;  Surgeon: Shannon Kos, MD;  Location: [facility];  Service: Neurosurgery;  Laterality: N/A;  3C   MYOMECTOMY N/A 02/03/2014   Procedure: MYOMECTOMY ;  Surgeon: Shannon Lesch, MD;  Location: [facility];  Service: Gynecology;  Laterality: N/A;   SALPINGECTOMY     TONSILLECTOMY     tonsils and adnoids removed     TUBAL LIGATION N/A 08/19/2015   Procedure: POST PARTUM TUBAL LIGATION;  Surgeon: Shannon Graff, MD;  Location: [facility];  Service: Gynecology;  Laterality: N/A;   WISDOM TOOTH EXTRACTION      Current Medications: Current Outpatient Medications on File Prior to Visit  Medication Sig   acetaminophen (TYLENOL) 500 MG tablet Take 500 mg by mouth every 6 (six) hours as needed for headache (Cramps).   albuterol (VENTOLIN HFA) 108 (90 Base) MCG/ACT inhaler Inhale 2 puffs into the lungs every 6 (  six) hours as needed for wheezing or shortness of breath.   baclofen (LIORESAL) 10 MG tablet Take 0.5-1 tablets (5-10 mg total) by mouth 3 (three) times daily as needed for muscle spasms.   DULoxetine (CYMBALTA) 30 MG capsule Take 30 mg by mouth See admin instructions. Take with 60 mg for a total of 90 mg at bedtime   DULoxetine (CYMBALTA) 60 MG capsule  Take 60 mg by mouth See admin instructions. Take with 30 mg for a total of 90 mg at bedtime   propranolol (INDERAL) 20 MG tablet Take 1 tablet (20 mg total) by mouth 2 (two) times daily. (Patient taking differently: Take 20 mg by mouth daily.)   VYVANSE 70 MG capsule Take 70 mg by mouth every morning.   tranexamic acid (LYSTEDA) 650 MG TABS tablet Take 1,300 mg by mouth 3 (three) times daily.   No current facility-administered medications on file prior to visit.     Allergies:   Ceftriaxone, Diclofenac, Fluconazole, Pineapple, Latex, and Bactrim [sulfamethoxazole-trimethoprim]   Social History   Tobacco Use   Smoking status: Never   Smokeless tobacco: Never  Vaping Use   Vaping Use: Never used  Substance Use Topics   Alcohol use: No    Comment: occasionally   Drug use: No    Family History: family history includes Anxiety disorder in her mother; Asthma in her brother; Bipolar disorder in her mother; Breast cancer in her cousin, maternal grandmother, and paternal aunt; Colon polyps in her paternal uncle; Depression in her mother; Diabetes in her mother; Heart disease in her maternal grandmother; Hyperlipidemia in her mother; Hypertension in her father, maternal grandmother, and mother; Liver cancer in her maternal uncle; Liver disease in her mother; Lung cancer in her maternal aunt; Mental illness in her mother; Obesity in her mother; Ovarian cancer in her cousin, paternal aunt, paternal grandmother, and another family member; Pancreatic cancer (age of onset: 9) in her maternal aunt; Seizures in her brother; Sleep apnea in her mother; Stroke in her mother.  ROS:   Please see the history of present illness. (+) Palpitations (+) Mild chest pain (+) Dizziness All other systems are reviewed and negative.    EKGs/Labs/Other Studies Reviewed:    The following studies were reviewed today:  Monitor 07/2021: ~6 days of data recorded on Zio monitor. Patient had a min HR of 51 bpm, max HR  of 250 bpm, and avg HR of 92 bpm. Predominant underlying rhythm was Sinus Rhythm. 1 run of Ventricular Tachycardia occurred lasting 4 beats with a max rate of 250 bpm (avg 221 bpm). No SVT, atrial fibrillation, high degree block, or pauses noted. Isolated atrial and ventricular ectopy was rare (<1%). There were 3 triggered events, which were sinus rhythm. No significant arrhythmias detected.  Echo 05/29/2021:  1. Left ventricular ejection fraction, by estimation, is 60 to 65%. The  left ventricle has normal function. The left ventricle has no regional  wall motion abnormalities. Left ventricular diastolic parameters were  normal.   2. Right ventricular systolic function is normal. The right ventricular  size is normal.   3. The mitral valve is normal in structure. No evidence of mitral valve  regurgitation. No evidence of mitral stenosis.   4. The aortic valve is normal in structure. Aortic valve regurgitation is  not visualized. No aortic stenosis is present.   5. The inferior vena cava is normal in size with greater than 50%  respiratory variability, suggesting right atrial pressure of 3 mmHg.  EKG:  EKG is personally reviewed.   08/30/2021: not ordered today 08/01/21: sinus tachycardia at 109 bpm  Recent Labs: 04/18/2021: BNP <2.5; Magnesium 2.1 06/07/2021: BUN 9; Creatinine 1.0; Potassium 4.6; Sodium 140; TSH 1.19 08/23/2021: ALT 15; Hemoglobin 11.9; Platelets 261   Recent Lipid Panel    Component Value Date/Time   CHOL 215 (A) 06/07/2021 0000   CHOL 221 (H) 08/08/2020 1653   TRIG 110 06/07/2021 0000   HDL 46 06/07/2021 0000   HDL 68 08/08/2020 1653   CHOLHDL 3.3 08/08/2020 1653   CHOLHDL 2.9 11/20/2012 1535   VLDL 15 11/20/2012 1535   LDLCALC 149 06/07/2021 0000   LDLCALC 134 (H) 08/08/2020 1653    Physical Exam:    VS:  BP 136/88 (BP Location: Left Arm, Patient Position: Sitting, Cuff Size: Large)   Pulse 89   Ht 5' 4.5" (1.638 m)   Wt 259 lb 1.6 oz (117.5 kg)   LMP  08/07/2021   SpO2 99%   BMI 43.79 kg/m     Wt Readings from Last 3 Encounters:  08/30/21 259 lb 1.6 oz (117.5 kg)  08/25/21 254 lb (115.2 kg)  08/23/21 255 lb 3.2 oz (115.8 kg)    GEN: Well nourished, well developed in no acute distress HEENT: Normal, moist mucous membranes NECK: No JVD CARDIAC: regular rhythm, normal S1 and S2, no rubs or gallops. No murmur. VASCULAR: Radial and DP pulses 2+ bilaterally. No carotid bruits RESPIRATORY:  Clear to auscultation without rales, wheezing or rhonchi  ABDOMEN: Soft, non-tender, non-distended MUSCULOSKELETAL:  Ambulates independently SKIN: Warm and dry, no edema NEUROLOGIC:  Alert and oriented x 3. No focal neuro deficits noted. PSYCHIATRIC:  Normal affect    ASSESSMENT:    1. Heart palpitations   2. Preop cardiovascular exam   3. Essential hypertension   4. Cardiac risk counseling   5. Counseling on health promotion and disease prevention   6. Encounter to discuss test results    PLAN:    Palpitations: -Reviewed monitor results, reassuring. There were only 4 beats of NSVT, which with normal echo does not give an elevated risk. No SVT or other arrythmias -suspect noncardiac cause of symptoms -she wishes to continue her vyvanse. With normal echo and current monitor findings, this is acceptable -wishes to follow up post op to see if symptoms improved  Preoperative cardiovascular risk: Based on available date, patient's RCRI score = 0, which carries a 3.9% 30-day risk of death, MI, or cardiac arrest.  The patient is not currently having active cardiac symptoms, and they can achieve >4 METs of activity.  According to ACC/AHA Guidelines, no further testing is needed.  Proceed with surgery at acceptable risk.  Our service is available as needed in the peri-operative period.    Elevated blood pressure: pending bariatric surgery. Follow up post op to make sure readings have improved.  Cardiac risk counseling and prevention  recommendations: -recommend heart healthy/Mediterranean diet, with whole grains, fruits, vegetable, fish, lean meats, nuts, and olive oil. Limit salt. -recommend moderate walking, 3-5 times/week for 30-50 minutes each session. Aim for at least 150 minutes.week. Goal should be pace of 3 miles/hours, or walking 1.5 miles in 30 minutes -recommend avoidance of tobacco products. Avoid excess alcohol. -ASCVD risk score: The 10-year ASCVD risk score (Arnett DK, et al., 2019) is: 3.4%   Values used to calculate the score:     Age: 30 years     Sex: Female     Is Non-Hispanic African American: Yes  Diabetic: No     Tobacco smoker: No     Systolic Blood Pressure: 217 mmHg     Is BP treated: Yes     HDL Cholesterol: 46 mg/dL     Total Cholesterol: 215 mg/dL    Plan for follow up: 4 months or sooner as needed.  Shannon Dresser, MD, PhD, [facility]    Medication Adjustments/Labs and Tests Ordered: Current medicines are reviewed at length with the patient today.  Concerns regarding medicines are outlined above.   No orders of the defined types were placed in this encounter.  No orders of the defined types were placed in this encounter.  Patient Instructions  Medication Instructions:  Your Physician recommend you continue on your current medication as directed.    *If you need a refill on your cardiac medications before your next appointment, please call your pharmacy*   Lab Work: None ordered today   Testing/Procedures: None ordered today   Follow-Up: At [facility], you and your health needs are our priority.  As part of our continuing mission to provide you with exceptional heart care, we have created designated Provider Care Teams.  These Care Teams include your primary Cardiologist (physician) and Advanced Practice Providers (APPs -  Physician Assistants and Nurse Practitioners) who all work together to provide you with the care you need, when you  need it.  We recommend signing up for the patient portal called "MyChart".  Sign up information is provided on this After Visit Summary.  MyChart is used to connect with patients for Virtual Visits (Telemedicine).  Patients are able to view lab/test results, encounter notes, upcoming appointments, etc.  Non-urgent messages can be sent to your provider as well.   To learn more about what you can do with MyChart, go to [email or web address].    Your next appointment:   4 month(s)  The format for your next appointment:   In Person  Provider:   Buford Dresser, MD      [facility],acting as a scribe for Shannon Dresser, MD.,have documented all relevant documentation on the behalf of Shannon Dresser, MD,as directed by  Shannon Dresser, MD while in the presence of Shannon Dresser, MD.  I, Shannon Dresser, MD, have reviewed all documentation for this visit. The documentation on 08/30/21 for the exam, diagnosis, procedures, and orders are all accurate and complete.   Signed, Shannon Dresser, MD PhD 08/30/2021 5:24 PM    [place]

## 2021-08-30 NOTE — Telephone Encounter (Signed)
Per Dr. Harrell Gave, ok for pt to take Vyvanse. Pt made aware and verbalized understanding. Pt will also send pcp and med management fax number via mychart to send office note.

## 2021-08-30 NOTE — Patient Instructions (Signed)
Medication Instructions:  Your Physician recommend you continue on your current medication as directed.    *If you need a refill on your cardiac medications before your next appointment, please call your pharmacy*   Lab Work: None ordered today   Testing/Procedures: None ordered today   Follow-Up: At [facility], you and your health needs are our priority.  As part of our continuing mission to provide you with exceptional heart care, we have created designated Provider Care Teams.  These Care Teams include your primary Cardiologist (physician) and Advanced Practice Providers (APPs -  Physician Assistants and Nurse Practitioners) who all work together to provide you with the care you need, when you need it.  We recommend signing up for the patient portal called "MyChart".  Sign up information is provided on this After Visit Summary.  MyChart is used to connect with patients for Virtual Visits (Telemedicine).  Patients are able to view lab/test results, encounter notes, upcoming appointments, etc.  Non-urgent messages can be sent to your provider as well.   To learn more about what you can do with MyChart, go to [email or web address].    Your next appointment:   4 month(s)  The format for your next appointment:   In Person  Provider:   Buford Dresser, MD

## 2021-08-30 NOTE — Telephone Encounter (Signed)
Patient has question about her results, she wants to know if she is cleared to continue taking her VYVANSE 70 MG capsule. She also needs documentation for clearance sent to PCP and her med mgmt doctor.

## 2021-08-31 ENCOUNTER — Other Ambulatory Visit (HOSPITAL_COMMUNITY): Payer: Self-pay

## 2021-09-01 ENCOUNTER — Ambulatory Visit: Payer: Self-pay | Admitting: Surgery

## 2021-09-01 NOTE — Progress Notes (Signed)
Sent message, via epic in basket, requesting orders in epic from surgeon.  

## 2021-09-01 NOTE — Patient Instructions (Addendum)
DUE TO COVID-19 ONLY ONE VISITOR IS ALLOWED TO COME WITH YOU AND STAY IN THE WAITING ROOM ONLY DURING PRE OP AND PROCEDURE DAY OF SURGERY IF YOU ARE GOING HOME AFTER SURGERY. IF YOU ARE SPENDING THE NIGHT 2 PEOPLE MAY VISIT WITH YOU IN YOUR PRIVATE ROOM AFTER SURGERY UNTIL VISITING  HOURS ARE OVER AT 8:00 PM AND 1  VISITOR  MAY  SPEND THE NIGHT.   YOU NEED TO HAVE A COVID 19 TEST ON__12/2__THIS TEST MUST BE DONE BEFORE SURGERY,  COVID TESTING SITE  IS LOCATED AT [place], [place]. REMAIN IN YOUR CAR THIS IS A DRIVE UP TEST. AFTER YOUR COVID TEST PLEASE WEAR A MASK OUT IN PUBLIC AND SOCIAL DISTANCE AND [place] YOUR HANDS FREQUENTLY, ALSO ASK ALL YOUR CLOSE CONTACT PERSONS TO WEAR A MASK AND SOCIAL DISTANCE AND [place] THEIR HANDS FREQUENTLY ALSO.               Hula Moskal     Your procedure is scheduled on: 09/26/21   Report to [facility] Main  Entrance   Report to admitting at   6:15 AM     Call this number if you have problems the morning of surgery [phone]      CLEAR LIQUID DIET   Foods Allowed                                                                     Foods Excluded  Coffee and tea, regular and decaf                             liquids that you cannot  Plain Jell-O any favor except red or purple                                           see through such as: Fruit ices (not with fruit pulp)                                     milk, soups, orange juice  Iced Popsicles                                    All solid food Carbonated beverages, regular and diet                                    Cranberry, grape and apple juices Sports drinks like Gatorade Lightly seasoned clear broth or consume(fat free) Sugar    NO SOLID FOOD AFTER 6:00 PM THE NIGHT BEFORE YOUR SURGERY.   YOU MAY DRINK CLEAR FLUIDS. UNTIL 5:30 am  THE G2 DRINK YOU DRINK BEFORE YOU LEAVE HOME WILL BE THE LAST FLUIDS YOU DRINK BEFORE SURGERY.   PAIN IS EXPECTED AFTER SURGERY  AND WILL NOT BE COMPLETELY ELIMINATED.   AMBULATION AND TYLENOL WILL HELP REDUCE INCISIONAL AND  GAS PAIN. MOVEMENT IS KEY!  YOU ARE EXPECTED TO BE OUT OF BED WITHIN 4 HOURS OF ADMISSION TO YOUR PATIENT ROOM.  SITTING IN THE RECLINER THROUGHOUT THE DAY IS IMPORTANT FOR DRINKING FLUIDS AND MOVING GAS THROUGHOUT THE GI TRACT.  COMPRESSION STOCKINGS SHOULD BE WORN [place] UNLESS YOU ARE WALKING.   INCENTIVE SPIROMETER SHOULD BE USED EVERY HOUR WHILE AWAKE TO DECREASE POST-OPERATIVE COMPLICATIONS SUCH AS PNEUMONIA.  WHEN DISCHARGED HOME, IT IS IMPORTANT TO CONTINUE TO WALK EVERY HOUR AND USE THE INCENTIVE SPIROMETER EVERY HOUR.      BRUSH YOUR TEETH MORNING OF SURGERY AND RINSE YOUR MOUTH OUT, NO CHEWING GUM CANDY OR MINTS.     Take these medicines the morning of surgery with A SIP OF WATER: Duloxetine, Propranolol, Use your inhaler and bring it with you                                You may not have any metal on your body including hair pins and              piercings  Do not wear jewelry, make-up, lotions, powders or perfumes, deodorant             Do not wear nail polish on your fingernails.  Do not shave  48 hours prior to surgery.                 Do not bring valuables to the hospital. [place].  Contacts, dentures or bridgework may not be worn into surgery.  Leave suitcase in the car. After surgery it may be brought to your room.                 [place] - Preparing for Surgery Before surgery, you can play an important role.  Because skin is not sterile, your skin needs to be as free of germs as possible.  You can reduce the number of germs on your skin by washing with CHG (chlorahexidine gluconate) soap before surgery.  CHG is an antiseptic cleaner which kills germs and bonds with the skin to continue killing germs even after washing. Please DO NOT use if you have an allergy to CHG or antibacterial  soaps.  If your skin becomes reddened/irritated stop using the CHG and inform your nurse when you arrive at Short Stay. Do not shave (including legs and underarms) for at least 48 hours prior to the first CHG shower.   Please follow these instructions carefully:  1.  Shower with CHG Soap the night before surgery and the  morning of Surgery.  2.  If you choose to wash your hair, wash your hair first as usual with your  normal  shampoo.  3.  After you shampoo, rinse your hair and body thoroughly to remove the  shampoo.                            4.  Use CHG as you would any other liquid soap.  You can apply chg directly  to the skin and wash                       Gently with a scrungie or clean washcloth.  5.  Apply the  CHG Soap to your body ONLY FROM THE NECK DOWN.   Do not use on face/ open                           Wound or open sores. Avoid contact with eyes, ears mouth and genitals (private parts).                       Wash face,  Genitals (private parts) with your normal soap.             6.  Wash thoroughly, paying special attention to the area where your surgery  will be performed.  7.  Thoroughly rinse your body with warm water from the neck down.  8.  DO NOT shower/wash with your normal soap after using and rinsing off  the CHG Soap.                9.  Pat yourself dry with a clean towel.            10.  Wear clean pajamas.            11.  Place clean sheets on your bed the night of your first shower and do not  sleep with pets. Day of Surgery : Do not apply any lotions/deodorants the morning of surgery.  Please wear clean clothes to the hospital/surgery center.  FAILURE TO FOLLOW THESE INSTRUCTIONS MAY RESULT IN THE CANCELLATION OF YOUR SURGERY PATIENT SIGNATURE_________________________________  NURSE SIGNATURE__________________________________  ________________________________________________________________________

## 2021-09-03 ENCOUNTER — Encounter (HOSPITAL_BASED_OUTPATIENT_CLINIC_OR_DEPARTMENT_OTHER): Payer: Self-pay | Admitting: Cardiology

## 2021-09-04 ENCOUNTER — Encounter (HOSPITAL_COMMUNITY): Payer: Self-pay

## 2021-09-04 ENCOUNTER — Encounter: Payer: Self-pay | Admitting: Internal Medicine

## 2021-09-04 ENCOUNTER — Other Ambulatory Visit: Payer: Self-pay

## 2021-09-04 ENCOUNTER — Encounter (HOSPITAL_COMMUNITY)
Admission: RE | Admit: 2021-09-04 | Discharge: 2021-09-04 | Disposition: A | Discharge status: Home / Self Care | Payer: BC Managed Care – PPO | Source: Ambulatory Visit | Attending: Surgery | Admitting: Surgery

## 2021-09-04 VITALS — BP 132/92 | HR 98 | Temp 98.8°F | Resp 18 | Ht 64.6 in | Wt 253.0 lb

## 2021-09-04 DIAGNOSIS — I10 Essential (primary) hypertension: Secondary | ICD-10-CM | POA: Diagnosis not present

## 2021-09-04 DIAGNOSIS — Z01812 Encounter for preprocedural laboratory examination: Secondary | ICD-10-CM | POA: Diagnosis not present

## 2021-09-04 LAB — CBC
HCT: 37.2 % (ref 36.0–46.0)
Hemoglobin: 11.5 g/dL — ABNORMAL LOW (ref 12.0–15.0)
MCH: 24.7 pg — ABNORMAL LOW (ref 26.0–34.0)
MCHC: 30.9 g/dL (ref 30.0–36.0)
MCV: 80 fL (ref 80.0–100.0)
Platelets: 280 10*3/uL (ref 150–400)
RBC: 4.65 MIL/uL (ref 3.87–5.11)
RDW: 13.9 % (ref 11.5–15.5)
WBC: 6.9 10*3/uL (ref 4.0–10.5)
nRBC: 0 % (ref 0.0–0.2)

## 2021-09-04 LAB — BASIC METABOLIC PANEL
Anion gap: 5 (ref 5–15)
BUN: 10 mg/dL (ref 6–20)
CO2: 27 mmol/L (ref 22–32)
Calcium: 9.2 mg/dL (ref 8.9–10.3)
Chloride: 106 mmol/L (ref 98–111)
Creatinine, Ser: 0.87 mg/dL (ref 0.44–1.00)
GFR, Estimated: >60 mL/min (ref >60)
Glucose, Bld: 88 mg/dL (ref 70–99)
Potassium: 3.9 mmol/L (ref 3.5–5.1)
Sodium: 138 mmol/L (ref 135–145)

## 2021-09-04 NOTE — Progress Notes (Signed)
COVID test 09/22/21   PCP - Dr. Johnnye Lana Cardiologist - Dr. Myles Gip  Chest x-ray - no EKG - 08/01/21-epic Stress Test - no ECHO - 05/29/21 for fast HR Cardiac Cath - no Pacemaker/ICD device last checked:NA  Sleep Study - yes- negative CPAP - no  Fasting Blood Sugar - NA Checks Blood Sugar _____ times a day  Blood Thinner Instructions:NA Aspirin Instructions: Last Dose:  Anesthesia review:   Patient denies shortness of breath, fever, cough and chest pain at PAT appointment Pt can climb 2-3 flights , do housework and ADLs without any SOB.   Patient verbalized understanding of instructions that were given to them at the PAT appointment. Patient was also instructed that they will need to review over the PAT instructions again at home before surgery. yes

## 2021-09-05 ENCOUNTER — Ambulatory Visit: Payer: BC Managed Care – PPO

## 2021-09-05 ENCOUNTER — Telehealth: Payer: Self-pay

## 2021-09-05 VITALS — BP 126/80 | HR 78 | Temp 98.1°F | Ht 64.6 in | Wt 260.0 lb

## 2021-09-05 DIAGNOSIS — I10 Essential (primary) hypertension: Secondary | ICD-10-CM

## 2021-09-05 NOTE — Telephone Encounter (Signed)
error 

## 2021-09-05 NOTE — Progress Notes (Addendum)
Patient is here for bp check. She states that her BP has been elevated at other appointments. Provider would like for pt to return in a week for blood pressure check to make sure that it is still normal.   During nurse visit, pt expressed that she felt that her providers did not like each other. She heard a rumor that Dr Chauncey Cruel does not like her Mental health provider because of past personal history. Per Dr. Baird Cancer, she has never met Stephannie Peters, NP, a former employee of Dr. Darleene Cleaver.  Pt feels that the two providers do not agree on her meds. Dr. Baird Cancer previously advised pt to speak with Crystal, NP regarding her meds Vyvanse and duloxetine which could have been contributing to her symptoms of palpitations and diaphoresis. Pt states she has since been taken off of the Vyvanse.    Dr. Baird Cancer asked me to advise pt - if she does not feel she is getting her needs met, perhaps she should seek care elsewhere. Pt stated that this is what she plans to do. However, she does plan to RTO next week for repeat BP as a nurse visit.

## 2021-09-12 ENCOUNTER — Ambulatory Visit: Payer: BC Managed Care – PPO

## 2021-09-12 NOTE — Progress Notes (Deleted)
Pt presents today for BPC.

## 2021-09-13 ENCOUNTER — Other Ambulatory Visit: Payer: Self-pay | Admitting: Obstetrics and Gynecology

## 2021-09-22 ENCOUNTER — Other Ambulatory Visit: Payer: Self-pay | Admitting: Surgery

## 2021-09-23 LAB — SARS CORONAVIRUS 2 (TAT 6-24 HRS): SARS Coronavirus 2: NEGATIVE

## 2021-09-25 NOTE — H&P (Signed)
Admitting Physician: [place]  Service: Bariatric Surgery  CC: Morbid obesity  Subjective   HPI: Shannon [name] is an 40 y.o. female who is here for robotic sleeve gastrectomy.  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   Vitamin D deficiency     Past Surgical History:  Procedure Laterality Date   BIOPSY  07/25/2021   Procedure: BIOPSY;  Surgeon: Felicie Morn, MD;  Location: [facility];  Service: General;;   [place] N/A 02/03/2014   Procedure: DILATATION & CURETTAGE/HYSTEROSCOPY ;  Surgeon: Delice Lesch, MD;  Location: [facility];  Service: Gynecology;  Laterality: N/A;   DILATION AND CURETTAGE OF UTERUS  2001   ESOPHAGOGASTRODUODENOSCOPY N/A 07/25/2021   Procedure: ESOPHAGOGASTRODUODENOSCOPY (EGD);  Surgeon: Felicie Morn, MD;  Location: [facility];  Service: General;  Laterality: N/A;   LAMINECTOMY N/A 03/17/2021   Procedure: Laminectomy - Lumbar Four-Lumbar Five for Intradural mass;  Surgeon: Kary Kos, MD;  Location: [facility];  Service: Neurosurgery;  Laterality: N/A;  3C   MYOMECTOMY N/A 02/03/2014   Procedure: MYOMECTOMY ;  Surgeon: Delice Lesch, MD;  Location: [facility];  Service: Gynecology;  Laterality: N/A;   SALPINGECTOMY  2016   TONSILLECTOMY     age 72   TUBAL LIGATION N/A 08/19/2015   Procedure: POST PARTUM TUBAL LIGATION;  Surgeon: Everett Graff, MD;  Location: [facility];  Service: Gynecology;  Laterality: N/A;   WISDOM TOOTH EXTRACTION     age 64    Family History  Problem Relation Age of Onset    Hypertension Mother    Diabetes Mother    Mental illness Mother    Hyperlipidemia Mother    Stroke Mother    Depression Mother    Anxiety disorder Mother    Bipolar disorder Mother    Liver disease Mother    Sleep apnea Mother    Obesity Mother    Hypertension Father    Asthma Brother    Seizures Brother    Pancreatic cancer Maternal Aunt 72   Lung cancer Maternal Aunt        dx after 4; smoking hx   Liver cancer Maternal Uncle        dx after 4   Breast cancer Paternal Aunt        two paternal aunts, age 27s-40s   Ovarian cancer Paternal Aunt        two paternal aunts, reported BRCA mutation   Colon polyps Paternal Uncle        two paternal uncles; more than 2 lifetime polyps   Heart disease Maternal Grandmother    Hypertension Maternal Grandmother    Breast cancer Maternal Grandmother        dx 32s-40s; d. 46   Ovarian cancer Paternal Grandmother    Ovarian cancer Other        PGM's mother   Breast cancer Cousin        dx 62s   Ovarian cancer Cousin  dx 46s    Social:  reports that she has never smoked. She has never used smokeless tobacco. She reports that she does not drink alcohol and does not use drugs.  Allergies:  Allergies  Allergen Reactions   Ceftriaxone Hives and Rash    No SOB - patient willing to try again   Diclofenac Rash   Fluconazole Hives    Raw rash on abdomen and she had sores and swelling in mouth     Pineapple Shortness Of Breath   Latex Rash   Bactrim [Sulfamethoxazole-Trimethoprim] Hives    Medications: Current Outpatient Medications  Medication Instructions   acetaminophen (TYLENOL) 500 mg, Oral, Every 6 hours PRN   albuterol (VENTOLIN HFA) 108 (90 Base) MCG/ACT inhaler 2 puffs, Inhalation, Every 6 hours PRN   baclofen (LIORESAL) 5-10 mg, Oral, 3 times daily PRN   DULoxetine (CYMBALTA) 60 mg, Oral, See admin instructions, Take with 30 mg for a total of 90 mg at bedtime   DULoxetine (CYMBALTA) 30 mg, Oral, See admin  instructions, Take with 60 mg for a total of 90 mg at bedtime   propranolol (INDERAL) 20 mg, Oral, 2 times daily   tranexamic acid (LYSTEDA) 1,300 mg, Oral, 3 times daily   Vyvanse 70 mg, Oral, Every morning    ROS - all of the below systems have been reviewed with the patient and positives are indicated with bold text General: chills, fever or night sweats Eyes: blurry vision or double vision ENT: epistaxis or sore throat Allergy/Immunology: itchy/watery eyes or nasal congestion Hematologic/Lymphatic: bleeding problems, blood clots or swollen lymph nodes Endocrine: temperature intolerance or unexpected weight changes Breast: new or changing breast lumps or nipple discharge Resp: cough, shortness of breath, or wheezing CV: chest pain or dyspnea on exertion GI: as per HPI GU: dysuria, trouble voiding, or hematuria MSK: joint pain or joint stiffness Neuro: TIA or stroke symptoms Derm: pruritus and skin lesion changes Psych: anxiety and depression  Objective   PE Blood pressure 128/88, pulse 87, temperature 98.7 F (37.1 C), temperature source Oral, resp. rate 16, height 5' 4.6" (1.641 m), weight 114.7 kg, last menstrual period 09/04/2021, SpO2 95 %, unknown if currently breastfeeding. Constitutional: NAD; conversant; no deformities Eyes: Moist conjunctiva; no lid lag; anicteric; PERRL Neck: Trachea midline; no thyromegaly Lungs: Normal respiratory effort; no tactile fremitus CV: RRR; no palpable thrills; no pitting edema GI: Abd soft, nontender; no palpable hepatosplenomegaly MSK: Normal range of motion of extremities; no clubbing/cyanosis Psychiatric: Appropriate affect; alert and oriented x3 Lymphatic: No palpable cervical or axillary lymphadenopathy  Results for orders placed or performed during the hospital encounter of 09/26/21 (from the past 24 hour(s))  Pregnancy, urine     Status: None   Collection Time: 09/26/21  6:23 AM  Result Value Ref Range   Preg Test, Ur  NEGATIVE NEGATIVE    Imaging Orders  No imaging studies ordered today     Assessment and Plan   Shannon [name] is an 40 y.o. female who is seen at the request of Dr. Baird Cancer for bariatric surgery consultation. The patient has morbid obesity with a BMI of Body mass index is 42.93 kg/m and the following conditions related to obesity: hypertension, hyperlipidemia.  We discussed the surgical options to treat obesity and its associated comorbidity. After discussing the available procedures in the region, we discussed in great detail the surgeries I offer: robotic sleeve gastrectomy and robotic roux-en-y gastric bypass. We discussed the procedures themselves as well as their risks, benefits and  alternatives. I entered the patient's basic information into the [facility] Metabolic Surgery Risk/Benefit Calculator to facilitate this discussion.   After a full discussion and all questions answered, the patient is interested in pursuing a sleeve gastrectomy.  She completed the preoperative pathway: - Bloodwork - Dietician consult - Completed with Sandie Ano - EKG - Completed 06/29/21 - Psychology evaluation - Sleep study - already ordered by PCP - Completed 06/27/21 with Dr. Annamaria Boots.  No sleep apnea, some insomnia - Cardiology preoperative evaluation - completed with Dr. Domenic Polite 06/29/21.  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 - Upper endoscopy with biopsy. Normal, negative for H. Pylori  She presents today for robotic gastric bypass.  We discussed the risks, benefits and alternatives again and the patient granted consent to proceed.  Will proceed today as scheduled.    Felicie Morn, MD  [facility] Use [email or web address] to contact on call provider

## 2021-09-26 ENCOUNTER — Inpatient Hospital Stay (HOSPITAL_COMMUNITY): Payer: BC Managed Care – PPO | Admitting: Anesthesiology

## 2021-09-26 ENCOUNTER — Encounter (HOSPITAL_COMMUNITY)
Admission: RE | Disposition: A | Discharge status: Home / Self Care | Payer: Self-pay | Source: Home / Self Care | Attending: Surgery

## 2021-09-26 ENCOUNTER — Encounter (HOSPITAL_COMMUNITY): Payer: Self-pay | Admitting: Surgery

## 2021-09-26 ENCOUNTER — Inpatient Hospital Stay (HOSPITAL_COMMUNITY)
Admission: RE | Admit: 2021-09-26 | Discharge: 2021-09-27 | DRG: 621 | Disposition: A | Discharge status: Home / Self Care | Payer: BC Managed Care – PPO | Attending: Surgery | Admitting: Surgery

## 2021-09-26 ENCOUNTER — Other Ambulatory Visit: Payer: Self-pay

## 2021-09-26 DIAGNOSIS — Z888 Allergy status to other drugs, medicaments and biological substances status: Secondary | ICD-10-CM | POA: Diagnosis not present

## 2021-09-26 DIAGNOSIS — Z833 Family history of diabetes mellitus: Secondary | ICD-10-CM | POA: Diagnosis not present

## 2021-09-26 DIAGNOSIS — Z79899 Other long term (current) drug therapy: Secondary | ICD-10-CM | POA: Diagnosis not present

## 2021-09-26 DIAGNOSIS — Z8249 Family history of ischemic heart disease and other diseases of the circulatory system: Secondary | ICD-10-CM | POA: Diagnosis not present

## 2021-09-26 DIAGNOSIS — Z825 Family history of asthma and other chronic lower respiratory diseases: Secondary | ICD-10-CM

## 2021-09-26 DIAGNOSIS — F419 Anxiety disorder, unspecified: Secondary | ICD-10-CM | POA: Diagnosis present

## 2021-09-26 DIAGNOSIS — Z91040 Latex allergy status: Secondary | ICD-10-CM | POA: Diagnosis not present

## 2021-09-26 DIAGNOSIS — Z91018 Allergy to other foods: Secondary | ICD-10-CM | POA: Diagnosis not present

## 2021-09-26 DIAGNOSIS — Z823 Family history of stroke: Secondary | ICD-10-CM

## 2021-09-26 DIAGNOSIS — Z86018 Personal history of other benign neoplasm: Secondary | ICD-10-CM

## 2021-09-26 DIAGNOSIS — Z9851 Tubal ligation status: Secondary | ICD-10-CM | POA: Diagnosis not present

## 2021-09-26 DIAGNOSIS — Z8041 Family history of malignant neoplasm of ovary: Secondary | ICD-10-CM | POA: Diagnosis not present

## 2021-09-26 DIAGNOSIS — E785 Hyperlipidemia, unspecified: Secondary | ICD-10-CM | POA: Diagnosis present

## 2021-09-26 DIAGNOSIS — J45909 Unspecified asthma, uncomplicated: Secondary | ICD-10-CM | POA: Diagnosis present

## 2021-09-26 DIAGNOSIS — I10 Essential (primary) hypertension: Secondary | ICD-10-CM | POA: Diagnosis present

## 2021-09-26 DIAGNOSIS — F32A Depression, unspecified: Secondary | ICD-10-CM | POA: Diagnosis present

## 2021-09-26 DIAGNOSIS — Z6841 Body Mass Index (BMI) 40.0 and over, adult: Secondary | ICD-10-CM | POA: Diagnosis not present

## 2021-09-26 DIAGNOSIS — F988 Other specified behavioral and emotional disorders with onset usually occurring in childhood and adolescence: Secondary | ICD-10-CM | POA: Diagnosis present

## 2021-09-26 DIAGNOSIS — Z8371 Family history of colonic polyps: Secondary | ICD-10-CM | POA: Diagnosis not present

## 2021-09-26 DIAGNOSIS — Z800 Family history of malignant neoplasm of digestive organs: Secondary | ICD-10-CM | POA: Diagnosis not present

## 2021-09-26 DIAGNOSIS — Z803 Family history of malignant neoplasm of breast: Secondary | ICD-10-CM | POA: Diagnosis not present

## 2021-09-26 DIAGNOSIS — Z801 Family history of malignant neoplasm of trachea, bronchus and lung: Secondary | ICD-10-CM | POA: Diagnosis not present

## 2021-09-26 DIAGNOSIS — Z818 Family history of other mental and behavioral disorders: Secondary | ICD-10-CM | POA: Diagnosis not present

## 2021-09-26 DIAGNOSIS — Z83438 Family history of other disorder of lipoprotein metabolism and other lipidemia: Secondary | ICD-10-CM

## 2021-09-26 DIAGNOSIS — Z01818 Encounter for other preprocedural examination: Secondary | ICD-10-CM

## 2021-09-26 HISTORY — PX: LAPAROSCOPIC GASTRIC SLEEVE RESECTION: SHX5895

## 2021-09-26 HISTORY — PX: UPPER GI ENDOSCOPY: SHX6162

## 2021-09-26 HISTORY — DX: Morbid (severe) obesity due to excess calories: E66.01

## 2021-09-26 LAB — HEMOGLOBIN AND HEMATOCRIT, BLOOD
HCT: 36.4 % (ref 36.0–46.0)
Hemoglobin: 11.8 g/dL — ABNORMAL LOW (ref 12.0–15.0)

## 2021-09-26 LAB — CBC
HCT: 34.3 % — ABNORMAL LOW (ref 36.0–46.0)
Hemoglobin: 11 g/dL — ABNORMAL LOW (ref 12.0–15.0)
MCH: 25.5 pg — ABNORMAL LOW (ref 26.0–34.0)
MCHC: 32.1 g/dL (ref 30.0–36.0)
MCV: 79.4 fL — ABNORMAL LOW (ref 80.0–100.0)
Platelets: 267 10*3/uL (ref 150–400)
RBC: 4.32 MIL/uL (ref 3.87–5.11)
RDW: 14.2 % (ref 11.5–15.5)
WBC: 8.2 10*3/uL (ref 4.0–10.5)
nRBC: 0 % (ref 0.0–0.2)

## 2021-09-26 LAB — CREATININE, SERUM
Creatinine, Ser: 0.76 mg/dL (ref 0.44–1.00)
GFR, Estimated: >60 mL/min (ref >60)

## 2021-09-26 LAB — PREGNANCY, URINE: Preg Test, Ur: NEGATIVE

## 2021-09-26 SURGERY — GASTRECTOMY, SLEEVE, ROBOT-ASSISTED
Anesthesia: General | Site: Abdomen

## 2021-09-26 MED ORDER — KETAMINE HCL-SODIUM CHLORIDE 100-0.9 MG/10ML-% IV SOSY
PREFILLED_SYRINGE | INTRAVENOUS | Status: AC
  Filled 2021-09-26: qty 10

## 2021-09-26 MED ORDER — CIPROFLOXACIN IN D5W 400 MG/200ML IV SOLN
400.0000 mg | INTRAVENOUS | Status: AC
  Administered 2021-09-26: 400 mg via INTRAVENOUS

## 2021-09-26 MED ORDER — SUGAMMADEX SODIUM 500 MG/5ML IV SOLN
INTRAVENOUS | Status: DC | PRN
  Administered 2021-09-26: 400 mg via INTRAVENOUS

## 2021-09-26 MED ORDER — ENOXAPARIN SODIUM 30 MG/0.3ML IJ SOSY
30.0000 mg | PREFILLED_SYRINGE | Freq: Two times a day (BID) | INTRAMUSCULAR | Status: DC
  Administered 2021-09-26 – 2021-09-27 (×2): 30 mg via SUBCUTANEOUS
  Filled 2021-09-26 (×2): qty 0.3

## 2021-09-26 MED ORDER — SCOPOLAMINE 1 MG/3DAYS TD PT72
MEDICATED_PATCH | TRANSDERMAL | Status: AC
  Filled 2021-09-26: qty 1

## 2021-09-26 MED ORDER — PANTOPRAZOLE SODIUM 40 MG IV SOLR
40.0000 mg | Freq: Every day | INTRAVENOUS | Status: DC
  Administered 2021-09-26: 40 mg via INTRAVENOUS
  Filled 2021-09-26: qty 40

## 2021-09-26 MED ORDER — SUGAMMADEX SODIUM 500 MG/5ML IV SOLN
INTRAVENOUS | Status: AC
  Filled 2021-09-26: qty 5

## 2021-09-26 MED ORDER — KETAMINE HCL 10 MG/ML IJ SOLN
INTRAMUSCULAR | Status: DC | PRN
  Administered 2021-09-26: 30 mg via INTRAVENOUS

## 2021-09-26 MED ORDER — DEXAMETHASONE SODIUM PHOSPHATE 10 MG/ML IJ SOLN
INTRAMUSCULAR | Status: DC | PRN
  Administered 2021-09-26: 5 mg via INTRAVENOUS

## 2021-09-26 MED ORDER — ACETAMINOPHEN 500 MG PO TABS
1000.0000 mg | ORAL_TABLET | Freq: Three times a day (TID) | ORAL | Status: DC
  Administered 2021-09-26 – 2021-09-27 (×2): 1000 mg via ORAL
  Filled 2021-09-26 (×2): qty 2

## 2021-09-26 MED ORDER — LACTATED RINGERS IR SOLN
Status: DC | PRN
  Administered 2021-09-26: 1000 mL

## 2021-09-26 MED ORDER — SCOPOLAMINE 1 MG/3DAYS TD PT72
MEDICATED_PATCH | TRANSDERMAL | Status: DC | PRN
  Administered 2021-09-26: 1.5 mg via TRANSDERMAL

## 2021-09-26 MED ORDER — BUPIVACAINE-EPINEPHRINE (PF) 0.25% -1:200000 IJ SOLN
INTRAMUSCULAR | Status: AC
  Filled 2021-09-26: qty 30

## 2021-09-26 MED ORDER — MORPHINE SULFATE (PF) 2 MG/ML IV SOLN: 1.0000 mg | INTRAVENOUS | Status: DC | PRN

## 2021-09-26 MED ORDER — LIDOCAINE 2% (20 MG/ML) 5 ML SYRINGE
INTRAMUSCULAR | Status: DC | PRN
  Administered 2021-09-26: 60 mg via INTRAVENOUS

## 2021-09-26 MED ORDER — SIMETHICONE 80 MG PO CHEW
80.0000 mg | CHEWABLE_TABLET | Freq: Four times a day (QID) | ORAL | Status: DC | PRN
  Administered 2021-09-26: 80 mg via ORAL
  Filled 2021-09-26: qty 1

## 2021-09-26 MED ORDER — ROCURONIUM BROMIDE 10 MG/ML (PF) SYRINGE
PREFILLED_SYRINGE | INTRAVENOUS | Status: DC | PRN
  Administered 2021-09-26: 10 mg via INTRAVENOUS
  Administered 2021-09-26: 70 mg via INTRAVENOUS

## 2021-09-26 MED ORDER — METRONIDAZOLE 500 MG/100ML IV SOLN
INTRAVENOUS | Status: AC
  Filled 2021-09-26: qty 100

## 2021-09-26 MED ORDER — OXYCODONE HCL 5 MG/5ML PO SOLN: 5.0000 mg | Freq: Once | ORAL | Status: DC | PRN

## 2021-09-26 MED ORDER — BUPIVACAINE-EPINEPHRINE 0.25% -1:200000 IJ SOLN
INTRAMUSCULAR | Status: DC | PRN
  Administered 2021-09-26: 30 mL

## 2021-09-26 MED ORDER — ACETAMINOPHEN 500 MG PO TABS
1000.0000 mg | ORAL_TABLET | ORAL | Status: AC
  Administered 2021-09-26: 1000 mg via ORAL

## 2021-09-26 MED ORDER — LACTATED RINGERS IV SOLN: INTRAVENOUS | Status: DC

## 2021-09-26 MED ORDER — METRONIDAZOLE 500 MG/100ML IV SOLN
500.0000 mg | INTRAVENOUS | Status: AC
  Administered 2021-09-26: 500 mg via INTRAVENOUS

## 2021-09-26 MED ORDER — BUPIVACAINE LIPOSOME 1.3 % IJ SUSP
INTRAMUSCULAR | Status: DC | PRN
  Administered 2021-09-26: 20 mL

## 2021-09-26 MED ORDER — PROPOFOL 10 MG/ML IV BOLUS
INTRAVENOUS | Status: AC
  Filled 2021-09-26: qty 20

## 2021-09-26 MED ORDER — BUPIVACAINE LIPOSOME 1.3 % IJ SUSP: 20.0000 mL | Freq: Once | INTRAMUSCULAR | Status: DC

## 2021-09-26 MED ORDER — MIDAZOLAM HCL 2 MG/2ML IJ SOLN
INTRAMUSCULAR | Status: DC | PRN
  Administered 2021-09-26: 2 mg via INTRAVENOUS

## 2021-09-26 MED ORDER — CIPROFLOXACIN IN D5W 400 MG/200ML IV SOLN
INTRAVENOUS | Status: AC
  Filled 2021-09-26: qty 200

## 2021-09-26 MED ORDER — GABAPENTIN 300 MG PO CAPS
ORAL_CAPSULE | ORAL | Status: AC
  Filled 2021-09-26: qty 1

## 2021-09-26 MED ORDER — PROMETHAZINE HCL 25 MG/ML IJ SOLN
6.2500 mg | INTRAMUSCULAR | Status: DC | PRN
  Administered 2021-09-26: 6.25 mg via INTRAVENOUS

## 2021-09-26 MED ORDER — OXYCODONE HCL 5 MG/5ML PO SOLN
5.0000 mg | Freq: Four times a day (QID) | ORAL | Status: DC | PRN
  Administered 2021-09-26 (×2): 5 mg via ORAL
  Filled 2021-09-26 (×2): qty 5

## 2021-09-26 MED ORDER — ONDANSETRON HCL 4 MG/2ML IJ SOLN
4.0000 mg | INTRAMUSCULAR | Status: DC | PRN
  Administered 2021-09-26 – 2021-09-27 (×3): 4 mg via INTRAVENOUS
  Filled 2021-09-26 (×3): qty 2

## 2021-09-26 MED ORDER — ACETAMINOPHEN 160 MG/5ML PO SOLN: 1000.0000 mg | Freq: Three times a day (TID) | ORAL | Status: DC

## 2021-09-26 MED ORDER — ONDANSETRON HCL 4 MG/2ML IJ SOLN
INTRAMUSCULAR | Status: DC | PRN
  Administered 2021-09-26: 4 mg via INTRAVENOUS

## 2021-09-26 MED ORDER — PROMETHAZINE HCL 25 MG/ML IJ SOLN
INTRAMUSCULAR | Status: AC
  Filled 2021-09-26: qty 1

## 2021-09-26 MED ORDER — GABAPENTIN 300 MG PO CAPS
300.0000 mg | ORAL_CAPSULE | ORAL | Status: AC
  Administered 2021-09-26: 300 mg via ORAL

## 2021-09-26 MED ORDER — OXYCODONE HCL 5 MG PO TABS: 5.0000 mg | ORAL_TABLET | Freq: Once | ORAL | Status: DC | PRN

## 2021-09-26 MED ORDER — ENOXAPARIN SODIUM 40 MG/0.4ML IJ SOSY
PREFILLED_SYRINGE | INTRAMUSCULAR | Status: AC
  Filled 2021-09-26: qty 0.4

## 2021-09-26 MED ORDER — CHLORHEXIDINE GLUCONATE CLOTH 2 % EX PADS: 6.0000 | MEDICATED_PAD | Freq: Once | CUTANEOUS | Status: DC

## 2021-09-26 MED ORDER — 0.9 % SODIUM CHLORIDE (POUR BTL) OPTIME
TOPICAL | Status: DC | PRN
  Administered 2021-09-26: 1000 mL

## 2021-09-26 MED ORDER — LIDOCAINE HCL (PF) 2 % IJ SOLN
INTRAMUSCULAR | Status: AC
  Filled 2021-09-26: qty 5

## 2021-09-26 MED ORDER — BUPIVACAINE LIPOSOME 1.3 % IJ SUSP
INTRAMUSCULAR | Status: AC
  Filled 2021-09-26: qty 20

## 2021-09-26 MED ORDER — ENOXAPARIN SODIUM 40 MG/0.4ML IJ SOSY
40.0000 mg | PREFILLED_SYRINGE | Freq: Once | INTRAMUSCULAR | Status: AC
  Administered 2021-09-26: 40 mg via SUBCUTANEOUS

## 2021-09-26 MED ORDER — FENTANYL CITRATE PF 50 MCG/ML IJ SOSY
25.0000 ug | PREFILLED_SYRINGE | INTRAMUSCULAR | Status: DC | PRN
  Administered 2021-09-26: 25 ug via INTRAVENOUS

## 2021-09-26 MED ORDER — ORAL CARE MOUTH RINSE: 15.0000 mL | Freq: Once | OROMUCOSAL | Status: AC

## 2021-09-26 MED ORDER — ACETAMINOPHEN 500 MG PO TABS
ORAL_TABLET | ORAL | Status: AC
  Filled 2021-09-26: qty 2

## 2021-09-26 MED ORDER — MIDAZOLAM HCL 2 MG/2ML IJ SOLN
INTRAMUSCULAR | Status: AC
  Filled 2021-09-26: qty 2

## 2021-09-26 MED ORDER — ENSURE MAX PROTEIN PO LIQD
2.0000 [oz_av] | ORAL | Status: DC
  Administered 2021-09-27 (×3): 2 [oz_av] via ORAL

## 2021-09-26 MED ORDER — PROPOFOL 10 MG/ML IV BOLUS
INTRAVENOUS | Status: DC | PRN
  Administered 2021-09-26: 200 mg via INTRAVENOUS

## 2021-09-26 MED ORDER — FENTANYL CITRATE (PF) 250 MCG/5ML IJ SOLN
INTRAMUSCULAR | Status: AC
  Filled 2021-09-26: qty 5

## 2021-09-26 MED ORDER — FENTANYL CITRATE PF 50 MCG/ML IJ SOSY
PREFILLED_SYRINGE | INTRAMUSCULAR | Status: AC
  Administered 2021-09-26: 25 ug via INTRAVENOUS
  Filled 2021-09-26: qty 2

## 2021-09-26 MED ORDER — CHLORHEXIDINE GLUCONATE 0.12 % MT SOLN
15.0000 mL | Freq: Once | OROMUCOSAL | Status: AC
  Administered 2021-09-26: 15 mL via OROMUCOSAL

## 2021-09-26 MED ORDER — FENTANYL CITRATE (PF) 250 MCG/5ML IJ SOLN
INTRAMUSCULAR | Status: DC | PRN
  Administered 2021-09-26: 50 ug via INTRAVENOUS
  Administered 2021-09-26 (×2): 100 ug via INTRAVENOUS

## 2021-09-26 SURGICAL SUPPLY — 77 items
ADH SKN CLS APL DERMABOND .7 (GAUZE/BANDAGES/DRESSINGS) ×2
APL PRP STRL LF DISP 70% ISPRP (MISCELLANEOUS) ×4
APPLIER CLIP 5 13 M/L LIGAMAX5 (MISCELLANEOUS)
APPLIER CLIP ROT 10 11.4 M/L (STAPLE)
APR CLP MED LRG 11.4X10 (STAPLE)
APR CLP MED LRG 5 ANG JAW (MISCELLANEOUS)
BAG COUNTER SPONGE SURGICOUNT (BAG) ×3 IMPLANT
BAG SPNG CNTER NS LX DISP (BAG) ×2
BLADE SURG SZ11 CARB STEEL (BLADE) ×3 IMPLANT
CANNULA REDUC XI 12-8 STAPL (CANNULA) ×3
CANNULA REDUCER 12-8 DVNC XI (CANNULA) ×2 IMPLANT
CHLORAPREP W/TINT 26 (MISCELLANEOUS) ×6 IMPLANT
CLIP APPLIE 5 13 M/L LIGAMAX5 (MISCELLANEOUS) IMPLANT
CLIP APPLIE ROT 10 11.4 M/L (STAPLE) IMPLANT
COVER SURGICAL LIGHT HANDLE (MISCELLANEOUS) ×3 IMPLANT
COVER TIP SHEARS 8 DVNC (MISCELLANEOUS) IMPLANT
COVER TIP SHEARS 8MM DA VINCI (MISCELLANEOUS)
DECANTER SPIKE VIAL GLASS SM (MISCELLANEOUS) ×3 IMPLANT
DERMABOND ADVANCED (GAUZE/BANDAGES/DRESSINGS) ×1
DERMABOND ADVANCED .7 DNX12 (GAUZE/BANDAGES/DRESSINGS) ×2 IMPLANT
DRAPE ARM DVNC X/XI (DISPOSABLE) ×8 IMPLANT
DRAPE COLUMN DVNC XI (DISPOSABLE) ×2 IMPLANT
DRAPE DA VINCI XI ARM (DISPOSABLE) ×12
DRAPE DA VINCI XI COLUMN (DISPOSABLE) ×3
ELECT REM PT RETURN 15FT ADLT (MISCELLANEOUS) ×3 IMPLANT
GAUZE 4X4 16PLY ~~LOC~~+RFID DBL (SPONGE) ×3 IMPLANT
GLOVE SURG ENC MOIS LTX SZ7.5 (GLOVE) ×6 IMPLANT
GLOVE SURG UNDER LTX SZ8 (GLOVE) ×6 IMPLANT
GOWN STRL REUS W/TWL XL LVL3 (GOWN DISPOSABLE) ×6 IMPLANT
GRASPER SUT TROCAR 14GX15 (MISCELLANEOUS) ×3 IMPLANT
HEMOSTAT SNOW SURGICEL 2X4 (HEMOSTASIS) IMPLANT
HOVERMATT SINGLE USE (MISCELLANEOUS) ×3 IMPLANT
IRRIG SUCT STRYKERFLOW 2 WTIP (MISCELLANEOUS) ×3
IRRIGATION SUCT STRKRFLW 2 WTP (MISCELLANEOUS) ×2 IMPLANT
KIT BASIN OR (CUSTOM PROCEDURE TRAY) ×3 IMPLANT
KIT TURNOVER KIT A (KITS) IMPLANT
LUBRICANT JELLY K Y 4OZ (MISCELLANEOUS) IMPLANT
MARKER SKIN DUAL TIP RULER LAB (MISCELLANEOUS) IMPLANT
NDL SPNL 18GX3.5 QUINCKE PK (NEEDLE) ×2 IMPLANT
NEEDLE SPNL 18GX3.5 QUINCKE PK (NEEDLE) ×3 IMPLANT
OBTURATOR OPTICAL STANDARD 8MM (TROCAR) ×3
OBTURATOR OPTICAL STND 8 DVNC (TROCAR) ×2
OBTURATOR OPTICALSTD 8 DVNC (TROCAR) ×2 IMPLANT
PACK CARDIOVASCULAR III (CUSTOM PROCEDURE TRAY) ×3 IMPLANT
RELOAD STAPLE 60 2.5 WHT DVNC (STAPLE) IMPLANT
RELOAD STAPLE 60 3.5 BLU DVNC (STAPLE) IMPLANT
RELOAD STAPLER 2.5X60 WHT DVNC (STAPLE) IMPLANT
RELOAD STAPLER 3.5X60 BLU DVNC (STAPLE) IMPLANT
SCISSORS LAP 5X35 DISP (ENDOMECHANICALS) IMPLANT
SEAL CANN UNIV 5-8 DVNC XI (MISCELLANEOUS) ×6 IMPLANT
SEAL XI 5MM-8MM UNIVERSAL (MISCELLANEOUS) ×9
SEALER SYNCHRO 8 IS4000 DV (MISCELLANEOUS) ×3
SEALER SYNCHRO 8 IS4000 DVNC (MISCELLANEOUS) ×2 IMPLANT
SLEEVE GASTRECTOMY 40FR VISIGI (MISCELLANEOUS) IMPLANT
SOL ANTI FOG 6CC (MISCELLANEOUS) ×2 IMPLANT
SOLUTION ANTI FOG 6CC (MISCELLANEOUS) ×1
SOLUTION ELECTROLUBE (MISCELLANEOUS) ×3 IMPLANT
STAPLER 60 DA VINCI SURE FORM (STAPLE) ×3
STAPLER 60 SUREFORM DVNC (STAPLE) ×2 IMPLANT
STAPLER CANNULA SEAL DVNC XI (STAPLE) ×2 IMPLANT
STAPLER CANNULA SEAL XI (STAPLE) ×3
STAPLER RELOAD 2.5X60 WHITE (STAPLE)
STAPLER RELOAD 2.5X60 WHT DVNC (STAPLE)
STAPLER RELOAD 3.5X60 BLU DVNC (STAPLE)
STAPLER RELOAD 3.5X60 BLUE (STAPLE)
SUT MNCRL AB 4-0 PS2 18 (SUTURE) ×6 IMPLANT
SUT VIC AB 0 CT1 27 (SUTURE) ×3
SUT VIC AB 0 CT1 27XBRD ANTBC (SUTURE) ×2 IMPLANT
SUT VIC AB 2-0 SH 27 (SUTURE)
SUT VIC AB 2-0 SH 27XBRD (SUTURE) IMPLANT
SYR 20ML LL LF (SYRINGE) ×3 IMPLANT
TOWEL OR 17X26 10 PK STRL BLUE (TOWEL DISPOSABLE) ×3 IMPLANT
TRAY FOLEY MTR SLVR 16FR STAT (SET/KITS/TRAYS/PACK) IMPLANT
TROCAR ADV FIXATION 12X100MM (TROCAR) IMPLANT
TROCAR Z-THREAD FIOS 5X100MM (TROCAR) ×3 IMPLANT
TUBE CALIBRATION LAPBAND (TUBING) IMPLANT
TUBING INSUFFLATION 10FT LAP (TUBING) ×3 IMPLANT

## 2021-09-26 NOTE — Progress Notes (Addendum)
MD Marcello Moores paged because patient wants home medication ordered. Per MD Marcello Moores wait till morning for MD to reorder medication.

## 2021-09-26 NOTE — Progress Notes (Signed)

## 2021-09-26 NOTE — Transfer of Care (Signed)
Immediate Anesthesia Transfer of Care Note  Patient: Shannon [name]  Procedure(s) Performed: XI ROBOT ASSISTED GASTRIC SLEEVE RESECTION (Abdomen) UPPER GI ENDOSCOPY  Patient Location: PACU  Anesthesia Type:General  Level of Consciousness: awake, alert  and patient cooperative  Airway & Oxygen Therapy: Patient Spontanous Breathing and Patient connected to face mask oxygen  Post-op Assessment: Report given to RN and Post -op Vital signs reviewed and stable  Post vital signs: Reviewed and stable  Last Vitals:  Vitals Value Taken Time  BP 157/100 09/26/21 1005  Temp 37.3 C 09/26/21 1005  Pulse 79 09/26/21 1006  Resp 20 09/26/21 1006  SpO2 100 % 09/26/21 1006  Vitals shown include unvalidated device data.  Last Pain:  Vitals:   09/26/21 0657  TempSrc: Oral         Complications: No notable events documented.

## 2021-09-26 NOTE — Progress Notes (Signed)
PHARMACY CONSULT FOR:  Risk Assessment for Post-Discharge VTE Following Bariatric Surgery  Post-Discharge VTE Risk Assessment: This patient's probability of 30-day post-discharge VTE is increased due to the factors marked:   Female    Age >/=60 years    BMI >/=50 kg/m2    CHF    Dyspnea at Rest    Paraplegia  X  Non-gastric-band surgery    Operation Time >/=3 hr    Return to OR     Length of Stay >/= 3 d   Hx of VTE   Hypercoagulable condition   Significant venous stasis       Predicted probability of 30-day post-discharge VTE: 0.16%  Other patient-specific factors to consider: N/A  Recommendation for Discharge: No pharmacologic prophylaxis post-discharge  Shannon [name] is a 40 y.o. female who underwent laparoscopic sleeve gastrectomy 09/26/2021   Allergies  Allergen Reactions   Ceftriaxone Hives and Rash    No SOB - patient willing to try again   Diclofenac Rash   Fluconazole Hives    Raw rash on abdomen and she had sores and swelling in mouth     Pineapple Shortness Of Breath   Latex Rash   Bactrim [Sulfamethoxazole-Trimethoprim] Hives    Patient Measurements: Height: 5' 4.6" (164.1 cm) Weight: 114.7 kg (252 lb 12.8 oz) IBW/kg (Calculated) : 56.08 Body mass index is 42.59 kg/m.  Recent Labs    09/26/21 1023  WBC 8.2  HGB 11.0*  HCT 34.3*  PLT 267  CREATININE 0.76   Estimated Creatinine Clearance: 117.3 mL/min (by C-G formula based on SCr of 0.76 mg/dL).    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   Past Month   albuterol (VENTOLIN HFA) 108 (90 Base) MCG/ACT inhaler Inhale 2 puffs into the lungs every 6 (six) hours as needed for wheezing or shortness of breath. 1 each 3 Past Week   baclofen (LIORESAL) 10 MG tablet Take 0.5-1 tablets (5-10 mg total) by mouth 3 (three) times daily as needed for muscle spasms. 90 each 5 Past Month   DULoxetine (CYMBALTA) 30 MG capsule Take 30 mg by mouth See admin instructions. Take with 60 mg for a total of 90 mg at bedtime   09/25/2021   DULoxetine (CYMBALTA) 60 MG capsule Take 60 mg by mouth See admin instructions. Take with 30 mg for a total of 90 mg at bedtime   09/25/2021   propranolol (INDERAL) 20 MG tablet Take 1 tablet (20 mg total) by mouth 2 (two) times daily. (Patient taking differently: Take 20 mg by mouth daily.) 60 tablet 3 09/25/2021   VYVANSE 70 MG capsule Take 70 mg by mouth every morning.   09/25/2021   tranexamic acid (LYSTEDA) 650 MG TABS tablet Take 1,300 mg by mouth 3 (three) times daily.  Eudelia Bunch, Pharm.D 09/26/2021 12:01 PM

## 2021-09-26 NOTE — Anesthesia Postprocedure Evaluation (Signed)
Anesthesia Post Note  Patient: Shannon [name]  Procedure(s) Performed: XI ROBOT ASSISTED GASTRIC SLEEVE RESECTION (Abdomen) UPPER GI ENDOSCOPY     Patient location during evaluation: PACU Anesthesia Type: General Level of consciousness: awake and alert Pain management: pain level controlled Vital Signs Assessment: post-procedure vital signs reviewed and stable Respiratory status: spontaneous breathing, nonlabored ventilation, respiratory function stable and patient connected to nasal cannula oxygen Cardiovascular status: blood pressure returned to baseline and stable Postop Assessment: no apparent nausea or vomiting Anesthetic complications: no   No notable events documented.  Last Vitals:  Vitals:   09/26/21 1031 09/26/21 1052  BP: (!) 151/90 (!) 153/99  Pulse: 71 70  Resp: 20 18  Temp:  36.6 C  SpO2: 99% 100%    Last Pain:  Vitals:   09/26/21 1052  TempSrc:   PainSc: [name]

## 2021-09-26 NOTE — Op Note (Signed)
Patient: Shannon [name] ([birth year], [id number])  Date of Surgery: 09/26/2021   Preoperative Diagnosis: Morbid obesity   Postoperative Diagnosis: Morbid obesity   Surgical Procedure: XI ROBOT ASSISTED GASTRIC SLEEVE RESECTION:  UPPER GI ENDOSCOPY: [id number]   Operative Team Members:  Surgeon(s) and Role:    * Arisa Congleton, Nickola Major, MD - Primary    * Johnathan Hausen, MD - Assisting   Anesthesiologist: Audry Pili, MD CRNA: Lollie Sails, CRNA   Anesthesia: General   Fluids:  Total I/O In: 300 [IV Piggyback:300] Out: 25 [XNATF:57]  Complications: None  Drains:  none   Specimen:  ID Type Source Tests Collected by Time Destination  1 : Greater curviture of stomach Tissue PATH GI benign resection SURGICAL PATHOLOGY [name], Nickola Major, MD 09/26/2021 0901      Disposition:  PACU - hemodynamically stable.  Plan of Care: Admit to inpatient     Indications for Procedure:  Shannon [name] is an 40 y.o. female who is seen at the request of Dr. Baird Cancer for bariatric surgery consultation. The patient has morbid obesity with a BMI of Body mass index is 42.93 kg/m and the following conditions related to obesity: hypertension, hyperlipidemia.  We discussed the surgical options to treat obesity and its associated comorbidity. After discussing the available procedures in the region, we discussed in great detail the surgeries I offer: robotic sleeve gastrectomy and robotic roux-en-y gastric bypass. We discussed the procedures themselves as well as their risks, benefits and alternatives. I entered the patient's basic information into the [facility] Metabolic Surgery Risk/Benefit Calculator to facilitate this discussion.   After a full discussion and all questions answered, the patient is interested in pursuing a sleeve gastrectomy.  She completed the preoperative pathway: - Bloodwork - Dietician consult - Completed with Sandie Ano - EKG - Completed 06/29/21 - Psychology evaluation -  Sleep study - already ordered by PCP - Completed 06/27/21 with Dr. Annamaria Boots.  No sleep apnea, some insomnia - Cardiology preoperative evaluation - completed with Dr. Domenic Polite 06/29/21.  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 - Upper endoscopy with biopsy. Normal, negative for H. Pylori   She presents today for robotic sleeve gastrectomy.  We discussed the risks, benefits and alternatives again and the patient granted consent to proceed.  Will proceed today as scheduled.    Findings: normal anatomy  Infection status: Patient: Private Patient Elective Case Case: Elective Infection Present At Time Of Surgery (PATOS): None   Description of Procedure:   On the date stated above, the patient was taken to the operating room suite and placed in supine positioning.  General endotracheal anesthesia was induced.  A timeout was completed verifying the correct patient, procedure, positioning and equipment needed for the case.  The patient's abdomen was prepped and draped in the usual sterile fashion.  I entered the patient's left upper quadrant using a 5 mm trocar in the optical technique.  There was no trauma to underlying viscera with initial trocar placement.  The abdomen was insufflated 15 mmHg.  A total of 4 robotic trochars were placed across the mid abdomen, including the 5 mm initial trocar being upsized to a 8 mm trocar.  The robotic stapler trocar was placed in the number two position.  The [facility] liver retractor was placed through the subxiphoid region and under the left lobe of the liver and was connected to the rail of the bed.  A TAP block was placed using marcaine  and Exparel under direct vision of the laparoscope.  The [place] was docked and we transitioned to robotic surgery.   Using the tip up fenestrated grasper, fenestrated bipolar, 30 degree camera and Synchroseal from the patient's right to left, we began  by dissecting the angle of His off the left crus of the diaphragm.  The adhesions between the stomach, spleen and diaphragm were divided using the Synchroseal to define the angle of His.  I then started 6 cm away from the pylorus along the greater curve the stomach and divided the gastroepiploic vessels and the gastrocolic ligament.  The lesser sac was entered.  There were really no adhesions to the posterior wall of the stomach.  The greater curve was mobilized working superiorly toward the spleen.  All of the gastroepiploic and short gastric vessels were divided as we divided the gastrocolic and gastrosplenic ligaments.  As we reached the splenic hilum, I lifted the stomach anteriorly.  I created a tunnel between the stomach and its attachments to the retroperitoneum posteriorly just to the left of the GE junction until I encountered the left crus and my previous angle of His dissection.  We then were able to approach the shortest of the short gastrics both from the greater curve the stomach laterally and from the left crus medially.  These were divided using the Synchroseal and the fundus of the stomach was fully mobilized.  With the stomach fully mobilized we direct our attention to stapling.  A 40 French VISI G was inserted into the stomach and positioned along the lesser curve the stomach and suction was applied.  The 60 mm robotic sureform linear stapler was used to create the sleeve gastrectomy.  We started 6 cm from the pylorus and were careful to avoid narrowing at the incisura.  We stayed about 1 cm away from the GE junction to protect the sling fibers.  We used 1 blue loads, and 4 white loads of the linear stapler.  A 2-0 vicryl was used to pexy the omentum to the distal two most staple lines which provided good hemostasis.  With the sleeve gastrectomy completed the VISI G was taken off suction and removed and we performed an upper endoscopy.  The foregut was submerged in saline irrigation and the adult  upper endoscope was inserted into the stomach as far as the pylorus to inspect the sleeve.  The sleeve appeared appropriately oriented without any twisting.  There was good hemostasis.  The sleeve was widely patent at the incisura with no narrowing.  There was no significant retained fundus.  The sleeve was inflated with the endoscope and there was no bubbling of the irrigation, suggesting a negative leak test and a airtight sleeve gastrectomy.  The foregut was decompressed with the endoscope and the endoscope was removed. There was good hemostasis at the end of the case.  The robot was undocked and moved away from the field.  The sleeve gastrectomy specimen was removed from the stapler port.  The fascia of the stapler port was closed using a 0 Vicryl on a PMI suture passer.   The liver retractor was removed under direct vision.  The pneumoperitoneum was evacuated.  The skin was closed using 4-0 Monocryl and Dermabond.  All sponge and needle counts were correct at the end of the case.    Louanna Raw, MD General, Bariatric, & Minimally Invasive Surgery [facility], [place]

## 2021-09-26 NOTE — Anesthesia Preprocedure Evaluation (Addendum)
Anesthesia Evaluation  Patient identified by MRN, date of birth, ID band Patient awake    Reviewed: Allergy & Precautions, NPO status , Patient's Chart, lab work & pertinent test results, reviewed documented beta blocker date and time   History of Anesthesia Complications Negative for: history of anesthetic complications  Airway Mallampati: II  TM Distance: >3 FB Neck ROM: Full    Dental  (+) Dental Advisory Given, Teeth Intact   Pulmonary asthma ,    Pulmonary exam normal        Cardiovascular hypertension, Pt. on medications and Pt. on home beta blockers Normal cardiovascular exam     Neuro/Psych  Headaches, PSYCHIATRIC DISORDERS Anxiety Depression  Neuromuscular disease    GI/Hepatic negative GI ROS, Neg liver ROS,   Endo/Other  Morbid obesity  Renal/GU negative Renal ROS     Musculoskeletal negative musculoskeletal ROS (+)   Abdominal   Peds  (+) ATTENTION DEFICIT DISORDER WITHOUT HYPERACTIVITY Hematology negative hematology ROS (+)   Anesthesia Other Findings   Reproductive/Obstetrics                            Anesthesia Physical Anesthesia Plan  ASA: 3  Anesthesia Plan: General   Post-op Pain Management: Tylenol PO (pre-op)   Induction: Intravenous  PONV Risk Score and Plan: 4 or greater and Treatment may vary due to age or medical condition, Ondansetron, Dexamethasone, Aprepitant, Midazolam and Scopolamine patch - Pre-op  Airway Management Planned: Oral ETT  Additional Equipment: None  Intra-op Plan:   Post-operative Plan: Extubation in OR  Informed Consent: I have reviewed the patients History and Physical, chart, labs and discussed the procedure including the risks, benefits and alternatives for the proposed anesthesia with the patient or authorized representative who has indicated his/her understanding and acceptance.     Dental advisory given  Plan Discussed  with: CRNA and Anesthesiologist  Anesthesia Plan Comments:        Anesthesia Quick Evaluation

## 2021-09-26 NOTE — Anesthesia Procedure Notes (Signed)

## 2021-09-27 ENCOUNTER — Encounter (HOSPITAL_COMMUNITY): Payer: Self-pay | Admitting: Surgery

## 2021-09-27 ENCOUNTER — Other Ambulatory Visit (HOSPITAL_COMMUNITY): Payer: Self-pay

## 2021-09-27 ENCOUNTER — Encounter: Payer: Self-pay | Admitting: Internal Medicine

## 2021-09-27 LAB — CBC WITH DIFFERENTIAL/PLATELET
Abs Immature Granulocytes: 0.03 10*3/uL (ref 0.00–0.07)
Basophils Absolute: 0 10*3/uL (ref 0.0–0.1)
Basophils Relative: 0 %
Eosinophils Absolute: 0 10*3/uL (ref 0.0–0.5)
Eosinophils Relative: 0 %
HCT: 33.8 % — ABNORMAL LOW (ref 36.0–46.0)
Hemoglobin: 10.8 g/dL — ABNORMAL LOW (ref 12.0–15.0)
Immature Granulocytes: 0 %
Lymphocytes Relative: 32 %
Lymphs Abs: 2.4 10*3/uL (ref 0.7–4.0)
MCH: 25.5 pg — ABNORMAL LOW (ref 26.0–34.0)
MCHC: 32 g/dL (ref 30.0–36.0)
MCV: 79.9 fL — ABNORMAL LOW (ref 80.0–100.0)
Monocytes Absolute: 0.6 10*3/uL (ref 0.1–1.0)
Monocytes Relative: 8 %
Neutro Abs: 4.4 10*3/uL (ref 1.7–7.7)
Neutrophils Relative %: 60 %
Platelets: 230 10*3/uL (ref 150–400)
RBC: 4.23 MIL/uL (ref 3.87–5.11)
RDW: 14.2 % (ref 11.5–15.5)
WBC: 7.5 10*3/uL (ref 4.0–10.5)
nRBC: 0 % (ref 0.0–0.2)

## 2021-09-27 LAB — SURGICAL PATHOLOGY

## 2021-09-27 MED ORDER — OXYCODONE HCL 5 MG PO TABS
5.0000 mg | ORAL_TABLET | Freq: Four times a day (QID) | ORAL | 0 refills | Status: DC | PRN
  Filled 2021-09-27: qty 10, 3d supply, fill #0

## 2021-09-27 MED ORDER — ACETAMINOPHEN 500 MG PO TABS: 1000.0000 mg | ORAL_TABLET | Freq: Three times a day (TID) | ORAL | 0 refills | Status: AC

## 2021-09-27 MED ORDER — PANTOPRAZOLE SODIUM 40 MG PO TBEC
40.0000 mg | DELAYED_RELEASE_TABLET | Freq: Every day | ORAL | 0 refills | Status: DC
  Filled 2021-09-27: qty 90, 90d supply, fill #0

## 2021-09-27 MED ORDER — ONDANSETRON 4 MG PO TBDP
4.0000 mg | ORAL_TABLET | Freq: Four times a day (QID) | ORAL | 0 refills | Status: DC | PRN
  Filled 2021-09-27: qty 18, 21d supply, fill #0

## 2021-09-27 MED ORDER — DULOXETINE HCL 60 MG PO CPEP: 60.0000 mg | ORAL_CAPSULE | ORAL | Status: DC

## 2021-09-27 MED ORDER — LISDEXAMFETAMINE DIMESYLATE 70 MG PO CAPS
70.0000 mg | ORAL_CAPSULE | Freq: Every morning | ORAL | Status: DC
Start: 2021-09-27 — End: 2021-09-27

## 2021-09-27 MED ORDER — DULOXETINE HCL 60 MG PO CPEP: 90.0000 mg | ORAL_CAPSULE | Freq: Every day | ORAL | Status: DC

## 2021-09-27 MED ORDER — ALBUTEROL SULFATE (2.5 MG/3ML) 0.083% IN NEBU: 2.5000 mg | INHALATION_SOLUTION | Freq: Four times a day (QID) | RESPIRATORY_TRACT | Status: DC | PRN

## 2021-09-27 MED ORDER — BACLOFEN 10 MG PO TABS: 5.0000 mg | ORAL_TABLET | Freq: Three times a day (TID) | ORAL | Status: DC | PRN

## 2021-09-27 MED ORDER — PROPRANOLOL HCL 20 MG PO TABS
20.0000 mg | ORAL_TABLET | Freq: Every day | ORAL | Status: DC
  Filled 2021-09-27: qty 1

## 2021-09-27 MED ORDER — DULOXETINE HCL 30 MG PO CPEP: 30.0000 mg | ORAL_CAPSULE | ORAL | Status: DC

## 2021-09-27 NOTE — Progress Notes (Signed)
Discharge instructions given to patient and all questions were answered.  

## 2021-09-27 NOTE — Discharge Summary (Signed)
Patient ID: Shannon [name] [id number] 40 y.o. 24-Feb-1981  09/26/2021  Discharge date and time: 09/27/2021  Admitting Physician: Collins  Discharge Physician: [place]  Admission Diagnoses: Morbid obesity with BMI of 40.0-44.9, adult ([place]) [E66.01, Z68.41] Patient Active Problem List   Diagnosis Date Noted   Morbid obesity with BMI of 40.0-44.9, adult ([place]) 09/26/2021   Chronic right-sided low back pain without sciatica 08/25/2021   Sweating profusely 08/25/2021   Genetic testing 05/08/2021   Family history of breast cancer 04/28/2021   Family history of pancreatic cancer 04/28/2021   Family history of ovarian cancer 04/28/2021   Myofascial pain on right side 04/14/2021   Paraganglioma ([place]) 04/13/2021   High blood pressure 04/05/2021   Tachycardia, unspecified 04/05/2021   Lumbar radiculitis 03/24/2021   Neurogenic bladder    Elevated blood pressure reading    Postoperative pain    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   Elevated blood-pressure reading without diagnosis of hypertension 05/26/2019   Class 2 obesity due to excess calories without serious comorbidity with body mass index (BMI) of 39.0 to 39.9 in adult 05/26/2019   Headache disorder 08/28/2015   Pre-eclampsia in postpartum period 08/26/2015   Susceptible to varicella (non-immune), currently pregnant 08/18/2015   History of myomectomy 08/18/2015   Latex allergy 08/18/2015   Hypertensive disorder-resolved with weight loss 08/18/2015   SVD (spontaneous vaginal delivery) 08/18/2015   Depression 01/19/2013   Vitamin D deficiency 11/21/2012   OVERWEIGHT 03/25/2009   BACK PAIN WITH RADICULOPATHY 03/23/2009   ASTHMA, UNSPECIFIED, UNSPECIFIED STATUS 10/03/2008     Discharge Diagnoses: Morbid obesity Patient Active Problem List   Diagnosis Date Noted   Morbid obesity with BMI of 40.0-44.9, adult ([place]) 09/26/2021   Chronic right-sided low back pain without sciatica 08/25/2021    Sweating profusely 08/25/2021   Genetic testing 05/08/2021   Family history of breast cancer 04/28/2021   Family history of pancreatic cancer 04/28/2021   Family history of ovarian cancer 04/28/2021   Myofascial pain on right side 04/14/2021   Paraganglioma ([place]) 04/13/2021   High blood pressure 04/05/2021   Tachycardia, unspecified 04/05/2021   Lumbar radiculitis 03/24/2021   Neurogenic bladder    Elevated blood pressure reading    Postoperative pain    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   Elevated blood-pressure reading without diagnosis of hypertension 05/26/2019   Class 2 obesity due to excess calories without serious comorbidity with body mass index (BMI) of 39.0 to 39.9 in adult 05/26/2019   Headache disorder 08/28/2015   Pre-eclampsia in postpartum period 08/26/2015   Susceptible to varicella (non-immune), currently pregnant 08/18/2015   History of myomectomy 08/18/2015   Latex allergy 08/18/2015   Hypertensive disorder-resolved with weight loss 08/18/2015   SVD (spontaneous vaginal delivery) 08/18/2015   Depression 01/19/2013   Vitamin D deficiency 11/21/2012   OVERWEIGHT 03/25/2009   BACK PAIN WITH RADICULOPATHY 03/23/2009   ASTHMA, UNSPECIFIED, UNSPECIFIED STATUS 10/03/2008    Operations: Procedure(s): XI ROBOT ASSISTED GASTRIC SLEEVE RESECTION UPPER GI ENDOSCOPY  Admission Condition: good  Discharged Condition: good  Indication for Admission: Morbid obesity  Hospital Course: Ms. Azure presented for elective sleeve gastrectomy.  She recovered well and was discharged.  Consults: None  Significant Diagnostic Studies: None  Treatments: surgery: as above  Disposition: Home  Patient Instructions:    Activity: no lifting, driving, or strenuous exercise for 4 weeks Diet:  bariatric diet protocol Wound Care: keep wound clean and dry  Follow-up:  With Dr.  Verniece Encarnacion per bariatric surgery protocol.  Signed: Nickola Major  Nasim Garofano General, Bariatric, & Minimally Invasive Surgery [facility], [place]   09/27/2021, 1:46 PM

## 2021-09-27 NOTE — Progress Notes (Signed)
24hr fluid recall: 810mL.  Per dehydration protocol, will call pt to f/u within one week post op.

## 2021-09-27 NOTE — Discharge Instructions (Signed)

## 2021-09-27 NOTE — Progress Notes (Signed)
Patient alert and oriented, pain is controlled. Patient is tolerating fluids, advanced to protein shake today, patient is tolerating well.  Reviewed Gastric sleeve discharge instructions with patient and patient is able to articulate understanding.  Provided information on BELT program, Support Group and [facility] outpatient pharmacy. All questions answered, will continue to monitor.  

## 2021-10-02 ENCOUNTER — Telehealth (HOSPITAL_COMMUNITY): Payer: Self-pay | Admitting: *Deleted

## 2021-10-02 NOTE — Telephone Encounter (Signed)
1.  Tell me about your pain and pain management? Pt states that her right lower abdominal incision pain with movement and exertion is getting better.  Pt states that she has been taking tylenol. Had discussed with patient to try and splint her abdomen with changing positions to assist with the discomfort.  Encouraged pt to try options and/or contact [facility] if still concerned.   2.  Let's talk about fluid intake.  How much total fluid are you taking in? Pt states that she is getting in at least 64oz of fluid including protein shakes, bottled water, and G2 Gatorade.  Pt instructed to assess status and suggestions daily utilizing Hydration Action Plan on discharge folder and to call [facility] if in the "red zone".   3.  How much protein have you taken in the last 2 days? Pt states she is meeting her goal of 60g of protein each day with the protein shakes.  4.  Have you had nausea?  Tell me about when have experienced nausea and what you did to help? Pt denies nausea.   5.  Has the frequency or color changed with your urine? Pt states that she is urinating "fine" with no changes in frequency or urgency.     6.  Tell me what your incisions look like? "Incisions look fine". Pt denies a fever, chills.  Pt states incisions are not swollen, open, or draining.  Pt encouraged to call [facility] if incisions change.   7.  Have you been passing gas? BM? Pt states that she is having BMs. Last BM 10/01/21.  Pt stated that she experienced constipation and after two doses of Miralax, pt did not have BM. Pt stated that she ate a few tablespoons of mashed potatoes and was able to have a BM.  Discussed with pt to call [facility] if any other issues with constipation and to avoid any solid foods until after meeting with nutrition on 12/20.   8.  If a problem or question were to arise who would you call?  Do you know contact numbers for [facility], [facility], and [facility]? Pt denies dehydration symptoms.  Pt can describe s/sx of dehydration.  Pt knows to  call [facility] for surgical, [facility] for nutrition, and [facility] for non-urgent questions or concerns.   9.  How has the walking going? Pt states she is walking around and able to be active without difficulty.   10. Are you still using your incentive spirometer?  If so, how often? Pt states that she is doing I.S. some. Pt encouraged to use incentive spirometer, at least 10x every hour while awake until she sees the surgeon.  11.  How are your vitamins and calcium going?  How are you taking them? Pt states that she is taking her supplements and vitamins without difficulty.  Reminded patient that the first 30 days post-operatively are important for successful recovery.  Practice good hand hygiene, wearing a mask when appropriate (since optional in most places), and minimizing exposure to people who live outside of the home, especially if they are exhibiting any respiratory, GI, or illness-like symptoms.

## 2021-10-05 ENCOUNTER — Other Ambulatory Visit: Payer: Self-pay | Admitting: Obstetrics and Gynecology

## 2021-10-05 ENCOUNTER — Encounter: Payer: Self-pay | Admitting: *Deleted

## 2021-10-05 NOTE — Progress Notes (Signed)
Shannon [name]  Clinical Social [name] received request from patient for letter to provide to employer.  CSW created general that states patient is under the care of Dr. Mickeal Skinner (see letter section). CSW provided to patient.      Gwinda Maine, LCSW  Clinical Social Worker [facility]

## 2021-10-06 ENCOUNTER — Encounter: Payer: Self-pay | Admitting: Internal Medicine

## 2021-10-09 ENCOUNTER — Other Ambulatory Visit: Payer: Self-pay

## 2021-10-09 ENCOUNTER — Encounter (HOSPITAL_BASED_OUTPATIENT_CLINIC_OR_DEPARTMENT_OTHER): Payer: Self-pay | Admitting: Obstetrics and Gynecology

## 2021-10-09 NOTE — Progress Notes (Signed)
Spoke w/ via phone for pre-op interview---pt Lab needs dos----urine pregnancy POCT               Lab results------10/10/21 type and screen, CBC, BMP, urine pregnancy, 08/02/21 EKG in chart & Epic, 05/29/21 Echo in Epic. COVID test -----patient states asymptomatic no test needed Arrive at -------1215 on 10/12/21 NPO after MN NO Solid Food.  Clear liquids from MN until---1115 Med rec completed Medications to take morning of surgery -----Albuterol prn, Protonix Diabetic medication -----n/a Patient instructed no nail polish to be worn day of surgery Patient instructed to bring photo id and insurance card day of surgery Patient aware to have Driver (ride ) / caregiver    for 24 hours after surgery - daughter (20 years old) or father Patient Special Instructions -----Bring inhaler. Pre-Op special Istructions -----none Patient verbalized understanding of instructions that were given at this phone interview. Patient denies shortness of breath, chest pain, fever, cough at this phone interview.   Patient had a lumbar laminectomy with removal of a cancerous paraganglioma on 03/17/21. After surgery she had episodes of tachycardia/palpitations and SOB. As of 10/09/2021 she denies any cardiac symptoms. She saw Dr. Buford Dresser (cardiologist) on 08/30/2021. She was given cardiac clearance for her recent 09/26/21 bariatric surgery.  08/02/21 EKG in chart & Epic 05/29/2021 Echo LVEF 60-65% in Epic 07/2021 Zio monitor for 6 days. Patient had one run of VT lasting 4 beats. Underlying rhythm was sinus rhythm. No significant arrhythmias detected. LOV: Cardiology Dr. Harrell Gave 08/30/21

## 2021-10-10 ENCOUNTER — Encounter: Payer: BC Managed Care – PPO | Attending: Surgery | Admitting: Skilled Nursing Facility1

## 2021-10-10 ENCOUNTER — Encounter (HOSPITAL_COMMUNITY)
Admission: RE | Admit: 2021-10-10 | Discharge: 2021-10-10 | Disposition: A | Discharge status: Home / Self Care | Payer: BC Managed Care – PPO | Source: Ambulatory Visit | Attending: Obstetrics and Gynecology | Admitting: Obstetrics and Gynecology

## 2021-10-10 DIAGNOSIS — I10 Essential (primary) hypertension: Secondary | ICD-10-CM | POA: Diagnosis not present

## 2021-10-10 DIAGNOSIS — Z6841 Body Mass Index (BMI) 40.0 and over, adult: Secondary | ICD-10-CM | POA: Insufficient documentation

## 2021-10-10 DIAGNOSIS — F419 Anxiety disorder, unspecified: Secondary | ICD-10-CM | POA: Diagnosis not present

## 2021-10-10 DIAGNOSIS — Z79899 Other long term (current) drug therapy: Secondary | ICD-10-CM | POA: Diagnosis not present

## 2021-10-10 DIAGNOSIS — K219 Gastro-esophageal reflux disease without esophagitis: Secondary | ICD-10-CM | POA: Diagnosis not present

## 2021-10-10 DIAGNOSIS — D259 Leiomyoma of uterus, unspecified: Secondary | ICD-10-CM | POA: Diagnosis present

## 2021-10-10 DIAGNOSIS — Z01812 Encounter for preprocedural laboratory examination: Secondary | ICD-10-CM | POA: Insufficient documentation

## 2021-10-10 DIAGNOSIS — J45909 Unspecified asthma, uncomplicated: Secondary | ICD-10-CM | POA: Diagnosis not present

## 2021-10-10 LAB — CBC
HCT: 39.9 % (ref 36.0–46.0)
Hemoglobin: 12.7 g/dL (ref 12.0–15.0)
MCH: 25.8 pg — ABNORMAL LOW (ref 26.0–34.0)
MCHC: 31.8 g/dL (ref 30.0–36.0)
MCV: 81.1 fL (ref 80.0–100.0)
Platelets: 316 10*3/uL (ref 150–400)
RBC: 4.92 MIL/uL (ref 3.87–5.11)
RDW: 14.5 % (ref 11.5–15.5)
WBC: 5.5 10*3/uL (ref 4.0–10.5)
nRBC: 0 % (ref 0.0–0.2)

## 2021-10-10 LAB — BASIC METABOLIC PANEL
Anion gap: 7 (ref 5–15)
BUN: 10 mg/dL (ref 6–20)
CO2: 26 mmol/L (ref 22–32)
Calcium: 9.9 mg/dL (ref 8.9–10.3)
Chloride: 105 mmol/L (ref 98–111)
Creatinine, Ser: 1 mg/dL (ref 0.44–1.00)
GFR, Estimated: >60 mL/min (ref >60)
Glucose, Bld: 103 mg/dL — ABNORMAL HIGH (ref 70–99)
Potassium: 4.2 mmol/L (ref 3.5–5.1)
Sodium: 138 mmol/L (ref 135–145)

## 2021-10-11 NOTE — Progress Notes (Signed)
2 Week Post-Operative Nutrition Class   Patient was seen on 10/10/2021 for Post-Operative Nutrition education at the [facility].    Surgery date: 09/26/2021 Surgery type: sleeve Start weight at [facility]: 253 Weight today: 239.3 Bowel Habits: Every day to every other day no complaints   Body Composition Scale 10/11/2021  Current Body Weight 239.3  Total Body Fat % 43.8  Visceral Fat 14  Fat-Free Mass % 56.1   Total Body Water % 42.5  Muscle-Mass lbs 32.1  BMI 41  Body Fat Displacement          Torso  lbs 65.1         Left Leg  lbs 13         Right Leg  lbs 13         Left Arm  lbs 6.5         Right Arm   lbs 6.5      The following the learning objectives were met by the patient during this course: Identifies Phase 3 (Soft, High Proteins) Dietary Goals and will begin from 2 weeks post-operatively to 2 months post-operatively Identifies appropriate sources of fluids and proteins  Identifies appropriate fat sources and healthy verses unhealthy fat types   States protein recommendations and appropriate sources post-operatively Identifies the need for appropriate texture modifications, mastication, and bite sizes when consuming solids Identifies appropriate fat consumption and sources Identifies appropriate multivitamin and calcium sources post-operatively Describes the need for physical activity post-operatively and will follow MD recommendations States when to call healthcare provider regarding medication questions or post-operative complications   Handouts given during class include: Phase 3A: Soft, High Protein Diet Handout Phase 3 High Protein Meals Healthy Fats   Follow-Up Plan: Patient will follow-up at [facility] in 6 weeks for 2 month post-op nutrition visit for diet advancement per MD.

## 2021-10-11 NOTE — Anesthesia Preprocedure Evaluation (Addendum)
Anesthesia Evaluation  Patient identified by MRN, date of birth, ID band Patient awake    Reviewed: Allergy & Precautions, NPO status , Patient's Chart, lab work & pertinent test results  Airway Mallampati: I  TM Distance: >3 FB Neck ROM: Full    Dental no notable dental hx. (+) Teeth Intact, Dental Advisory Given   Pulmonary asthma ,    Pulmonary exam normal breath sounds clear to auscultation       Cardiovascular hypertension, Pt. on medications Normal cardiovascular exam Rhythm:Regular Rate:Normal     Neuro/Psych  Headaches, Anxiety    GI/Hepatic Neg liver ROS, GERD  ,  Endo/Other  Morbid obesity (BMI 41.08)  Renal/GU negative Renal ROS     Musculoskeletal   Abdominal (+) + obese,   Peds  Hematology  (+) anemia , Lab Results      Component                Value               Date                      WBC                      5.5                 10/10/2021                HGB                      12.7                10/10/2021                HCT                      39.9                10/10/2021                MCV                      81.1                10/10/2021                PLT                      316                 10/10/2021           Lab Results      Component                Value               Date                      WBC                      5.5                 10/10/2021                HGB  12.7                10/10/2021                HCT                      39.9                10/10/2021                MCV                      81.1                10/10/2021                PLT                      316                 10/10/2021              Anesthesia Other Findings All: Ceftriaxon, Diclofenac, Fluconazole  Reproductive/Obstetrics negative OB ROS                            Anesthesia Physical Anesthesia Plan  ASA: 3  Anesthesia Plan: General    Post-op Pain Management: Dilaudid IV, Toradol IV (intra-op) and Tylenol PO (pre-op)   Induction:   PONV Risk Score and Plan: 4 or greater and Treatment may vary due to age or medical condition, Midazolam, Dexamethasone and Ondansetron  Airway Management Planned: LMA  Additional Equipment: None  Intra-op Plan:   Post-operative Plan: Extubation in OR  Informed Consent: I have reviewed the patients History and Physical, chart, labs and discussed the procedure including the risks, benefits and alternatives for the proposed anesthesia with the patient or authorized representative who has indicated his/her understanding and acceptance.     Dental advisory given  Plan Discussed with: CRNA and Anesthesiologist  Anesthesia Plan Comments:        Anesthesia Quick Evaluation

## 2021-10-12 ENCOUNTER — Ambulatory Visit (HOSPITAL_BASED_OUTPATIENT_CLINIC_OR_DEPARTMENT_OTHER): Payer: BC Managed Care – PPO | Admitting: Anesthesiology

## 2021-10-12 ENCOUNTER — Other Ambulatory Visit: Payer: Self-pay

## 2021-10-12 ENCOUNTER — Encounter (HOSPITAL_BASED_OUTPATIENT_CLINIC_OR_DEPARTMENT_OTHER)
Admission: RE | Disposition: A | Discharge status: Home / Self Care | Payer: Self-pay | Source: Home / Self Care | Attending: Obstetrics and Gynecology

## 2021-10-12 ENCOUNTER — Ambulatory Visit (HOSPITAL_BASED_OUTPATIENT_CLINIC_OR_DEPARTMENT_OTHER)
Admission: RE | Admit: 2021-10-12 | Discharge: 2021-10-12 | Disposition: A | Discharge status: Home / Self Care | Payer: BC Managed Care – PPO | Attending: Obstetrics and Gynecology | Admitting: Obstetrics and Gynecology

## 2021-10-12 ENCOUNTER — Encounter (HOSPITAL_BASED_OUTPATIENT_CLINIC_OR_DEPARTMENT_OTHER): Payer: Self-pay | Admitting: Obstetrics and Gynecology

## 2021-10-12 DIAGNOSIS — D259 Leiomyoma of uterus, unspecified: Secondary | ICD-10-CM | POA: Insufficient documentation

## 2021-10-12 DIAGNOSIS — I10 Essential (primary) hypertension: Secondary | ICD-10-CM | POA: Insufficient documentation

## 2021-10-12 DIAGNOSIS — F419 Anxiety disorder, unspecified: Secondary | ICD-10-CM | POA: Insufficient documentation

## 2021-10-12 DIAGNOSIS — K219 Gastro-esophageal reflux disease without esophagitis: Secondary | ICD-10-CM | POA: Insufficient documentation

## 2021-10-12 DIAGNOSIS — J45909 Unspecified asthma, uncomplicated: Secondary | ICD-10-CM | POA: Insufficient documentation

## 2021-10-12 DIAGNOSIS — Z79899 Other long term (current) drug therapy: Secondary | ICD-10-CM | POA: Insufficient documentation

## 2021-10-12 DIAGNOSIS — Z6841 Body Mass Index (BMI) 40.0 and over, adult: Secondary | ICD-10-CM | POA: Insufficient documentation

## 2021-10-12 HISTORY — PX: ENDOMETRIAL ABLATION: SHX621

## 2021-10-12 HISTORY — DX: Presence of spectacles and contact lenses: Z97.3

## 2021-10-12 HISTORY — PX: DILATATION & CURETTAGE/HYSTEROSCOPY WITH MYOSURE: SHX6511

## 2021-10-12 LAB — TYPE AND SCREEN
ABO/RH(D): B POS
Antibody Screen: NEGATIVE

## 2021-10-12 LAB — POCT PREGNANCY, URINE: Preg Test, Ur: NEGATIVE

## 2021-10-12 SURGERY — DILATATION & CURETTAGE/HYSTEROSCOPY WITH MYOSURE
Anesthesia: General | Site: Uterus

## 2021-10-12 MED ORDER — HYDROMORPHONE HCL 1 MG/ML IJ SOLN
0.2500 mg | INTRAMUSCULAR | Status: DC | PRN
  Administered 2021-10-12 (×4): 0.5 mg via INTRAVENOUS

## 2021-10-12 MED ORDER — FENTANYL CITRATE (PF) 100 MCG/2ML IJ SOLN
INTRAMUSCULAR | Status: DC | PRN
  Administered 2021-10-12: 50 ug via INTRAVENOUS
  Administered 2021-10-12 (×4): 25 ug via INTRAVENOUS

## 2021-10-12 MED ORDER — FENTANYL CITRATE (PF) 100 MCG/2ML IJ SOLN
INTRAMUSCULAR | Status: AC
  Filled 2021-10-12: qty 2

## 2021-10-12 MED ORDER — DIPHENHYDRAMINE HCL 50 MG/ML IJ SOLN
12.5000 mg | Freq: Once | INTRAMUSCULAR | Status: AC
  Administered 2021-10-12: 18:00:00 12.5 mg via INTRAVENOUS

## 2021-10-12 MED ORDER — KETOROLAC TROMETHAMINE 30 MG/ML IJ SOLN
INTRAMUSCULAR | Status: DC | PRN
  Administered 2021-10-12: 30 mg via INTRAVENOUS

## 2021-10-12 MED ORDER — LIDOCAINE 2% (20 MG/ML) 5 ML SYRINGE
INTRAMUSCULAR | Status: AC
  Filled 2021-10-12: qty 5

## 2021-10-12 MED ORDER — LIDOCAINE HCL 1 % IJ SOLN
INTRAMUSCULAR | Status: DC | PRN
  Administered 2021-10-12: 9 mL

## 2021-10-12 MED ORDER — OXYCODONE HCL 5 MG/5ML PO SOLN: 5.0000 mg | Freq: Once | ORAL | Status: AC | PRN

## 2021-10-12 MED ORDER — HYDROMORPHONE HCL 1 MG/ML IJ SOLN
INTRAMUSCULAR | Status: AC
  Filled 2021-10-12: qty 1

## 2021-10-12 MED ORDER — DEXAMETHASONE SODIUM PHOSPHATE 10 MG/ML IJ SOLN
INTRAMUSCULAR | Status: DC | PRN
  Administered 2021-10-12 (×2): 5 mg via INTRAVENOUS

## 2021-10-12 MED ORDER — MIDAZOLAM HCL 2 MG/2ML IJ SOLN
INTRAMUSCULAR | Status: DC | PRN
  Administered 2021-10-12: 2 mg via INTRAVENOUS

## 2021-10-12 MED ORDER — LIDOCAINE 2% (20 MG/ML) 5 ML SYRINGE
INTRAMUSCULAR | Status: DC | PRN
  Administered 2021-10-12: 100 mg via INTRAVENOUS

## 2021-10-12 MED ORDER — ONDANSETRON HCL 4 MG/2ML IJ SOLN
INTRAMUSCULAR | Status: AC
  Filled 2021-10-12: qty 2

## 2021-10-12 MED ORDER — CLINDAMYCIN PHOSPHATE 900 MG/50ML IV SOLN
900.0000 mg | INTRAVENOUS | Status: AC
  Administered 2021-10-12: 14:00:00 900 mg via INTRAVENOUS

## 2021-10-12 MED ORDER — OXYCODONE HCL 5 MG PO TABS
5.0000 mg | ORAL_TABLET | Freq: Once | ORAL | Status: AC | PRN
  Administered 2021-10-12: 18:00:00 5 mg via ORAL

## 2021-10-12 MED ORDER — OXYCODONE HCL 5 MG PO TABS
ORAL_TABLET | ORAL | Status: AC
  Filled 2021-10-12: qty 1

## 2021-10-12 MED ORDER — CLINDAMYCIN PHOSPHATE 900 MG/50ML IV SOLN
INTRAVENOUS | Status: AC
  Filled 2021-10-12: qty 50

## 2021-10-12 MED ORDER — SODIUM CHLORIDE 0.9 % IR SOLN
Status: DC | PRN
  Administered 2021-10-12: 3000 mL

## 2021-10-12 MED ORDER — ONDANSETRON HCL 4 MG/2ML IJ SOLN
INTRAMUSCULAR | Status: DC | PRN
  Administered 2021-10-12: 4 mg via INTRAVENOUS

## 2021-10-12 MED ORDER — MIDAZOLAM HCL 2 MG/2ML IJ SOLN
INTRAMUSCULAR | Status: AC
  Filled 2021-10-12: qty 2

## 2021-10-12 MED ORDER — ACETAMINOPHEN 500 MG PO TABS
ORAL_TABLET | ORAL | Status: AC
  Filled 2021-10-12: qty 2

## 2021-10-12 MED ORDER — DEXAMETHASONE SODIUM PHOSPHATE 10 MG/ML IJ SOLN
INTRAMUSCULAR | Status: AC
  Filled 2021-10-12: qty 1

## 2021-10-12 MED ORDER — OXYCODONE-ACETAMINOPHEN 5-325 MG PO TABS: 1.0000 | ORAL_TABLET | Freq: Four times a day (QID) | ORAL | 0 refills | Status: DC | PRN

## 2021-10-12 MED ORDER — POVIDONE-IODINE 10 % EX SWAB: 2.0000 "application " | Freq: Once | CUTANEOUS | Status: DC

## 2021-10-12 MED ORDER — PROPOFOL 10 MG/ML IV BOLUS
INTRAVENOUS | Status: AC
  Filled 2021-10-12: qty 20

## 2021-10-12 MED ORDER — KETOROLAC TROMETHAMINE 30 MG/ML IJ SOLN: 30.0000 mg | Freq: Once | INTRAMUSCULAR | Status: DC | PRN

## 2021-10-12 MED ORDER — PROPOFOL 10 MG/ML IV BOLUS
INTRAVENOUS | Status: DC | PRN
  Administered 2021-10-12: 200 mg via INTRAVENOUS

## 2021-10-12 MED ORDER — KETOROLAC TROMETHAMINE 30 MG/ML IJ SOLN
INTRAMUSCULAR | Status: AC
  Filled 2021-10-12: qty 1

## 2021-10-12 MED ORDER — IBUPROFEN 600 MG PO TABS: 600.0000 mg | ORAL_TABLET | Freq: Three times a day (TID) | ORAL | 1 refills | Status: DC | PRN

## 2021-10-12 MED ORDER — ONDANSETRON HCL 4 MG/2ML IJ SOLN: 4.0000 mg | Freq: Once | INTRAMUSCULAR | Status: DC | PRN

## 2021-10-12 MED ORDER — LACTATED RINGERS IV SOLN: INTRAVENOUS | Status: DC

## 2021-10-12 MED ORDER — DIPHENHYDRAMINE HCL 50 MG/ML IJ SOLN
INTRAMUSCULAR | Status: AC
  Filled 2021-10-12: qty 1

## 2021-10-12 MED ORDER — GENTAMICIN SULFATE 40 MG/ML IJ SOLN
5.0000 mg/kg | INTRAVENOUS | Status: AC
  Administered 2021-10-12: 15:00:00 380 mg via INTRAVENOUS
  Filled 2021-10-12: qty 9.5

## 2021-10-12 SURGICAL SUPPLY — 25 items
ABLATOR SURESOUND NOVASURE (ABLATOR) ×2 IMPLANT
CATH ROBINSON RED A/P 16FR (CATHETERS) ×5 IMPLANT
DEVICE MYOSURE LITE (MISCELLANEOUS) IMPLANT
DEVICE MYOSURE REACH (MISCELLANEOUS) IMPLANT
ELECT DISPERSIVE SONATA (ELECTRODE) ×6 IMPLANT
ELECT REM PT RETURN 9FT ADLT (ELECTROSURGICAL)
ELECTRODE REM PT RTRN 9FT ADLT (ELECTROSURGICAL) ×1 IMPLANT
GAUZE 4X4 16PLY ~~LOC~~+RFID DBL (SPONGE) ×3 IMPLANT
GLOVE SURG ENC MOIS LTX SZ7.5 (GLOVE) ×3 IMPLANT
GLOVE SURG UNDER POLY LF SZ7 (GLOVE) ×3 IMPLANT
GLOVE SURG UNDER POLY LF SZ7.5 (GLOVE) ×3 IMPLANT
GOWN STRL REUS W/TWL LRG LVL3 (GOWN DISPOSABLE) ×6 IMPLANT
HANDPIECE RFA SONATA (MISCELLANEOUS) ×3 IMPLANT
KIT PROCEDURE FLUENT (KITS) ×3 IMPLANT
KIT TURNOVER CYSTO (KITS) ×3 IMPLANT
MYOSURE XL FIBROID (MISCELLANEOUS)
PACK VAGINAL MINOR WOMEN LF (CUSTOM PROCEDURE TRAY) ×3 IMPLANT
PAD OB MATERNITY 4.3X12.25 (PERSONAL CARE ITEMS) ×3 IMPLANT
PAD PREP 24X48 CUFFED NSTRL (MISCELLANEOUS) ×3 IMPLANT
SEAL CERVICAL OMNI LOK (ABLATOR) IMPLANT
SEAL ROD LENS SCOPE MYOSURE (ABLATOR) ×3 IMPLANT
SYR 50ML LL SCALE MARK (SYRINGE) ×3 IMPLANT
SYSTEM TISS REMOVAL MYOSURE XL (MISCELLANEOUS) IMPLANT
TOWEL OR 17X26 10 PK STRL BLUE (TOWEL DISPOSABLE) ×3 IMPLANT
WATER STERILE IRR 500ML POUR (IV SOLUTION) ×3 IMPLANT

## 2021-10-12 NOTE — H&P (Addendum)
Shannon [name] is an 40 y.o. female. Pt presents for scheduled hysteroscopy D&C Sonata and Ablation due to symptomatic fibroids.  Em Bx neg 10-05-21.  Pertinent Gynecological History: LMP 09-28-21  Menstrual History: Heavy cycles Patient's last menstrual period was 09/28/2021 (approximate).    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   Vitamin D deficiency    Wears contact lenses    Wears glasses     Past Surgical History:  Procedure Laterality Date   BIOPSY  07/25/2021   Procedure: BIOPSY;  Surgeon: Felicie Morn, MD;  Location: [facility];  Service: General;;   [place] N/A 02/03/2014   Procedure: DILATATION & CURETTAGE/HYSTEROSCOPY ;  Surgeon: Delice Lesch, MD;  Location: [facility];  Service: Gynecology;  Laterality: N/A;   DILATION AND CURETTAGE OF UTERUS  2001   ESOPHAGOGASTRODUODENOSCOPY N/A 07/25/2021   Procedure: ESOPHAGOGASTRODUODENOSCOPY (EGD);  Surgeon: Felicie Morn, MD;  Location: [facility];  Service: General;  Laterality: N/A;   LAMINECTOMY N/A 03/17/2021   Procedure: Laminectomy - Lumbar Four-Lumbar Five for Intradural mass;  Surgeon: Kary Kos, MD;  Location: [facility];  Service: Neurosurgery;  Laterality: N/A;  3C   LAPAROSCOPIC GASTRIC SLEEVE RESECTION  09/26/2021   MYOMECTOMY N/A 02/03/2014   Procedure: MYOMECTOMY ;  Surgeon: Delice Lesch, MD;  Location: [facility];  Service: Gynecology;  Laterality: N/A;   SALPINGECTOMY   2016   TONSILLECTOMY     age 61   TUBAL LIGATION N/A 08/19/2015   Procedure: POST PARTUM TUBAL LIGATION;  Surgeon: Everett Graff, MD;  Location: [facility];  Service: Gynecology;  Laterality: N/A;   UPPER GI ENDOSCOPY N/A 09/26/2021   Procedure: UPPER GI ENDOSCOPY;  Surgeon: Felicie Morn, MD;  Location: [facility];  Service: General;  Laterality: N/A;   WISDOM TOOTH EXTRACTION     age 41    Family History  Problem Relation Age of Onset   Hypertension Mother    Diabetes Mother    Mental illness Mother    Hyperlipidemia Mother    Stroke Mother    Depression Mother    Anxiety disorder Mother    Bipolar disorder Mother    Liver disease Mother    Sleep apnea Mother    Obesity Mother    Hypertension Father    Asthma Brother    Seizures Brother    Pancreatic cancer Maternal Aunt 4   Lung cancer Maternal Aunt        dx after 68; smoking hx   Liver cancer Maternal Uncle        dx after 81   Breast cancer Paternal Aunt        two paternal aunts, age 68s-40s   Ovarian cancer Paternal Aunt        two paternal aunts, reported BRCA mutation   Colon polyps Paternal Uncle        two paternal  uncles; more than 10 lifetime polyps   Heart disease Maternal Grandmother    Hypertension Maternal Grandmother    Breast cancer Maternal Grandmother        dx 30s-40s; d. 71   Ovarian cancer Paternal Grandmother    Ovarian cancer Other        PGM's mother   Breast cancer Cousin        dx 4s   Ovarian cancer Cousin        dx 79s    Social History:  reports that she has never smoked. She has never used smokeless tobacco. She reports that she does not drink alcohol and does not use drugs.  Allergies:  Allergies  Allergen Reactions   Ceftriaxone Hives and Rash    No SOB - patient willing to try again   Diclofenac Rash   Fluconazole Hives    Raw rash on abdomen and she had sores and swelling in mouth     Pineapple Shortness Of Breath   Latex Rash   Bactrim  [Sulfamethoxazole-Trimethoprim] Hives    Medications Prior to Admission  Medication Sig Dispense Refill Last Dose   Calcium Citrate-Vitamin D (CELEBRATE CALCIUM CITRATE) 500-12.5 MG-MCG CHEW Chew by mouth in the morning, at noon, and at bedtime.   10/11/2021 at 2000   DULoxetine (CYMBALTA) 60 MG capsule Take 60 mg by mouth at bedtime.   10/11/2021   Multiple Vitamins-Minerals (CELEBRATE MULTI-COMPLETE 18) CHEW Chew by mouth daily.   10/11/2021 at 2000   pantoprazole (PROTONIX) 40 MG tablet Take 1 tablet  by mouth daily. (Patient taking differently: Take 40 mg by mouth as needed.) 90 tablet 0 10/11/2021   propranolol (INDERAL) 20 MG tablet Take 1 tablet (20 mg total) by mouth 2 (two) times daily. (Patient taking differently: Take 20 mg by mouth daily.) 60 tablet 3 10/11/2021 at 2000   zolpidem (AMBIEN) 10 MG tablet Take 10 mg by mouth at bedtime as needed for sleep.   10/11/2021 at 2000   acetaminophen (TYLENOL) 500 MG tablet Take 500 mg by mouth every 6 (six) hours as needed for headache (Cramps).   More than a month   albuterol (VENTOLIN HFA) 108 (90 Base) MCG/ACT inhaler Inhale 2 puffs into the lungs every 6 (six) hours as needed for wheezing or shortness of breath. 1 each 3 09/21/2021   baclofen (LIORESAL) 10 MG tablet Take 0.5-1 tablets (5-10 mg total) by mouth 3 (three) times daily as needed for muscle spasms. 90 each 5 More than a month   ondansetron (ZOFRAN-ODT) 4 MG disintegrating tablet Dissolve 1 tablet in mouth every 6 hours as needed for nausea or vomiting. 20 tablet 0 09/28/2021   VYVANSE 70 MG capsule Take 70 mg by mouth daily as needed. (Patient not taking: Reported on 10/09/2021)   More than a month    Review of Systems  Blood pressure 123/88, pulse 94, temperature 98.6 F (37 C), temperature source Oral, resp. rate 18, height 5' 4.5" (1.638 m), weight 108.5 kg, last menstrual period 09/28/2021, SpO2 99 %, unknown if currently breastfeeding. Physical Exam  Lungs CTA CV  RRR Abdomen soft, NT Ext no calf tenderness  Results for orders placed or performed during the hospital encounter of 10/12/21 (from the past 24 hour(s))  Pregnancy, urine POC     Status: None   Collection Time: 10/12/21 12:08 PM  Result Value Ref Range   Preg Test, Ur NEGATIVE NEGATIVE    No results found.  Assessment/Plan: Pt is [id number] with symptomatic  fibroids presenting today for hysteroscopy D&C Sonata and Ablation.  Questions answered and consent signed and witnessed.  Delice Lesch 10/12/2021, 2:04 PM

## 2021-10-12 NOTE — Op Note (Incomplete Revision)
Preop Diagnosis: Symptomatic Fibroids  Postop Diagnosis: Symptomatic Fibroids  Procedure: 1. HYSTEROSCOPY 2.DILATATION & CURETTAGE 3.RADIO FREQUENCY ABLATION WITH SONATA 4.NOVASURE ENDOMETRIAL ABLATION  Anesthesia: General  Anesthesiologist: Barnet Glasgow, MD   Attending: Everett Graff, MD   Assistant: N/a  Findings: Multiple fibroids  Pathology: Endometrial Curettings  Fluids: See flowsheet  UOP: 25 cc  EBL: 2cc  Complications: None  Procedure: The patient was taken to the operating room after the risks, benefits and alternatives discussed with the patient. The patient verbalized understanding and consent signed and witnessed. The patient was given a spinal per anesthesia and prepped and draped in the normal sterile fashion and Time Out performed per protocol. A bivalve speculum was placed in the patient's vagina and the anterior lip of the cervix was grasped with a single tooth tenaculum. A paracervical block was administered using a total of 10 cc of 1% lidocaine. The uterus sounded to 10.5 cm. The cervix was dilated for passage of the hysteroscope. The hysteroscope was introduced into the uterine cavity and findings as noted above. Sharp curettage was performed until a gritty texture was noted and currettings sent to pathology. The hysteroscope was reintroduced and no obvious remaining intracavitary lesions were noted. The Novasure instrument was introduced and ablation performed without difficulty. Hysteroscope reintroduced and good ablation results were noted. All instruments were removed. Sponge lap and needle count was correct. The patient tolerated the procedure well and was returned to the recovery room in good condition.

## 2021-10-12 NOTE — Anesthesia Postprocedure Evaluation (Signed)
Anesthesia Post Note  Patient: Shannon [name]  Procedure(s) Performed: DILATATION & CURETTAGE/HYSTEROSCOPY (Uterus) ENDOMETRIAL ABLATION (Uterus) Radio Frequency Ablation with Sonata (Uterus)     Patient location during evaluation: PACU Anesthesia Type: General Level of consciousness: awake and alert and oriented Pain management: pain level controlled Vital Signs Assessment: post-procedure vital signs reviewed and stable Respiratory status: spontaneous breathing, nonlabored ventilation and respiratory function stable Cardiovascular status: blood pressure returned to baseline Postop Assessment: no apparent nausea or vomiting Anesthetic complications: no   No notable events documented.  Last Vitals:  Vitals:   10/12/21 1700 10/12/21 1710  BP: 105/75 110/73  Pulse: 78 86  Resp: 12 12  Temp:  36.4 C  SpO2: 97% 95%                  [name]

## 2021-10-12 NOTE — Anesthesia Procedure Notes (Signed)
Procedure Name: LMA Insertion Date/Time: 10/12/2021 2:23 PM Performed by: Suan Halter, CRNA Pre-anesthesia Checklist: Patient identified, Emergency Drugs available, Suction available and Patient being monitored Patient Re-evaluated:Patient Re-evaluated prior to induction Oxygen Delivery Method: Circle system utilized Preoxygenation: Pre-oxygenation with 100% oxygen Induction Type: IV induction Ventilation: Mask ventilation without difficulty LMA: LMA inserted LMA Size: 4.0 Number of attempts: 1 Airway Equipment and Method: Bite block Placement Confirmation: positive ETCO2 Tube secured with: Tape Dental Injury: Teeth and Oropharynx as per pre-operative assessment

## 2021-10-12 NOTE — Op Note (Addendum)
Preop Diagnosis: Symptomatic Fibroids  Postop Diagnosis: Symptomatic Fibroids  Procedure: 1. HYSTEROSCOPY 2.DILATATION & CURETTAGE 3.RADIO FREQUENCY ABLATION WITH SONATA 4.NOVASURE ENDOMETRIAL ABLATION  Anesthesia: General  Anesthesiologist: Barnet Glasgow, MD   Attending: Everett Graff, MD   Assistant: N/a  Findings: Multiple fibroids  Pathology: Endometrial Curettings  Fluids: See flowsheet  UOP: 25 cc  EBL: 2cc  Complications: None  Procedure: The patient was taken to the operating room after the risks, benefits and alternatives discussed with the patient. The patient verbalized understanding and consent signed and witnessed. The patient was given a spinal per anesthesia and prepped and draped in the normal sterile fashion and Time Out performed per protocol. A bivalve speculum was placed in the patient's vagina and the anterior lip of the cervix was grasped with a single tooth tenaculum. A paracervical block was administered using a total of 10 cc of 1% lidocaine. The uterus sounded to 10.5 cm. The cervix was dilated for passage of the hysteroscope. The hysteroscope was introduced into the uterine cavity and findings as noted above. Sharp curettage was performed until a gritty texture was noted and curettings sent to pathology. The ultrasound sonata probe was placed in the uterus and with the guidance of the sonata ultrasound tech, 4 ablation cycles were performed.  The hysteroscope was reintroduced and no obvious intracavitary lesions were noted. The Novasure instrument was introduced and ablation performed without difficulty. Hysteroscope was reintroduced and good ablation results were noted. All instruments were removed. Sponge lap and needle count was correct. The patient tolerated the procedure well and was returned to the recovery room in good condition.

## 2021-10-12 NOTE — Transfer of Care (Signed)
Immediate Anesthesia Transfer of Care Note  Patient: Shannon [name]  Procedure(s) Performed: Procedure(s) (LRB): DILATATION & CURETTAGE/HYSTEROSCOPY (N/A) ENDOMETRIAL ABLATION (N/A) Radio Frequency Ablation with Sonata (N/A)  Patient Location: PACU  Anesthesia Type: General  Level of Consciousness: awake, oriented, sedated and patient cooperative  Airway & Oxygen Therapy: Patient Spontanous Breathing and Patient connected to face mask oxygen  Post-op Assessment: Report given to PACU RN and Post -op Vital signs reviewed and stable  Post vital signs: Reviewed and stable  Complications: No apparent anesthesia complications  Last Vitals:  Vitals Value Taken Time  BP 116/86 10/12/21 1606  Temp    Pulse 78 10/12/21 1610  Resp 15 10/12/21 1610  SpO2 98 % 10/12/21 1610  Vitals shown include unvalidated device data.  Last Pain:  Vitals:   10/12/21 1225  TempSrc: Oral  PainSc: 0-No pain      Patients Stated Pain Goal: 3 (77/93/90 3009)  Complications: No notable events documented.

## 2021-10-12 NOTE — Discharge Instructions (Addendum)

## 2021-10-17 ENCOUNTER — Encounter (HOSPITAL_BASED_OUTPATIENT_CLINIC_OR_DEPARTMENT_OTHER): Payer: Self-pay | Admitting: Obstetrics and Gynecology

## 2021-10-17 ENCOUNTER — Telehealth: Payer: Self-pay | Admitting: Skilled Nursing Facility1

## 2021-10-17 ENCOUNTER — Encounter: Payer: Self-pay | Admitting: *Deleted

## 2021-10-17 ENCOUNTER — Other Ambulatory Visit: Payer: Self-pay | Admitting: *Deleted

## 2021-10-17 ENCOUNTER — Telehealth: Payer: Self-pay | Admitting: *Deleted

## 2021-10-17 LAB — SURGICAL PATHOLOGY

## 2021-10-17 NOTE — Telephone Encounter (Signed)
Patient called asking how Dr Mickeal Skinner felt about patient returning to work.  She requested return with half days to start if he is agreeable.  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  Routing to MD to review

## 2021-10-17 NOTE — Telephone Encounter (Signed)
RD called pt to verify fluid intake once starting soft, solid proteins 2 week post-bariatric surgery.   Daily Fluid intake: 64 oz Daily Protein intake: 60 g Bowel Habits: every day to every other day  Concerns/issues:   None stated 

## 2021-10-17 NOTE — Progress Notes (Signed)
Faxing letter of return to work on parttime basis to Dr Mike Gip [phone]

## 2021-10-19 DIAGNOSIS — D259 Leiomyoma of uterus, unspecified: Secondary | ICD-10-CM | POA: Insufficient documentation

## 2021-10-20 ENCOUNTER — Telehealth: Payer: Self-pay

## 2021-10-20 NOTE — Telephone Encounter (Signed)
Transition Care Management Unsuccessful Follow-up Telephone Call  Date of discharge and from where:  09/27/21 from [facility]  Attempts:  1st Attempt  Reason for unsuccessful TCM follow-up call:  Left voice message  Thea Silversmith, RN, MSN, BSN, [place] Care Management Coordinator [phone]

## 2021-10-20 NOTE — Telephone Encounter (Addendum)
Transition Care Management Follow-up Telephone Call Date of discharge and from where: 09/27/21 from [facility] How have you been since you were released from the hospital? Reports doing well. Denies any questions or concerns at this time. Any questions or concerns? No  Items Reviewed: Did the pt receive and understand the discharge instructions provided? Yes  Medications obtained and verified? Yes  Other? No  Any new allergies since your discharge? No  Dietary orders reviewed? Yes Do you have support at home? Yes   Home Care and Equipment/Supplies: Were home health services ordered? no If so, what is the name of the agency? Not applicable  Has the agency set up a time to come to the patient's home? not applicable Were any new equipment or medical supplies ordered?  No What is the name of the medical supply agency? Not applicable Were you able to get the supplies/equipment? not applicable Do you have any questions related to the use of the equipment or supplies? No  Functional Questionnaire: (I = Independent and D = Dependent) ADLs: I  Bathing/Dressing- I  Meal Prep- I  Eating- I  Maintaining continence- I  Transferring/Ambulation- I  Managing Meds- I  Follow up appointments reviewed:  PCP Hospital f/u appt confirmed? Following up with specialist  Specialist Hospital f/u appt confirmed? Saw nutritionist on 10/10/21 and saw surgeon Dr. Louanna Raw on 10/19/21.  Are transportation arrangements needed? No  If their condition worsens, is the pt aware to call PCP or go to the Emergency Dept.? Yes Was the patient provided with contact information for the PCP's office or ED? Yes Was to pt encouraged to call back with questions or concerns? Yes   Thea Silversmith, RN, MSN, BSN, [place] Care Management Coordinator [phone]

## 2021-11-05 ENCOUNTER — Other Ambulatory Visit: Payer: Self-pay | Admitting: Internal Medicine

## 2021-11-13 ENCOUNTER — Encounter: Payer: Self-pay | Admitting: *Deleted

## 2021-11-14 ENCOUNTER — Encounter (HOSPITAL_COMMUNITY): Payer: Self-pay | Admitting: Obstetrics and Gynecology

## 2021-11-14 ENCOUNTER — Inpatient Hospital Stay (HOSPITAL_COMMUNITY)
Admission: AD | Admit: 2021-11-14 | Payer: BC Managed Care – PPO | Source: Ambulatory Visit | Admitting: Obstetrics and Gynecology

## 2021-11-14 ENCOUNTER — Inpatient Hospital Stay (HOSPITAL_COMMUNITY)
Admission: AD | Admit: 2021-11-14 | Discharge: 2021-11-16 | DRG: 759 | Disposition: A | Discharge status: Home / Self Care | Payer: BC Managed Care – PPO | Source: Ambulatory Visit | Attending: Obstetrics and Gynecology | Admitting: Obstetrics and Gynecology

## 2021-11-14 DIAGNOSIS — Z20822 Contact with and (suspected) exposure to covid-19: Secondary | ICD-10-CM | POA: Diagnosis present

## 2021-11-14 DIAGNOSIS — R102 Pelvic and perineal pain unspecified side: Secondary | ICD-10-CM

## 2021-11-14 DIAGNOSIS — N719 Inflammatory disease of uterus, unspecified: Principal | ICD-10-CM | POA: Diagnosis present

## 2021-11-14 LAB — URINALYSIS, ROUTINE W REFLEX MICROSCOPIC
Bacteria, UA: NONE SEEN
Bilirubin Urine: NEGATIVE
Glucose, UA: NEGATIVE mg/dL
Hgb urine dipstick: NEGATIVE
Ketones, ur: 5 mg/dL — AB
Nitrite: NEGATIVE
Protein, ur: NEGATIVE mg/dL
Specific Gravity, Urine: 1.029 (ref 1.005–1.030)
pH: 5 (ref 5.0–8.0)

## 2021-11-14 LAB — CBC WITH DIFFERENTIAL/PLATELET
Abs Immature Granulocytes: 0.01 10*3/uL (ref 0.00–0.07)
Basophils Absolute: 0 10*3/uL (ref 0.0–0.1)
Basophils Relative: 1 %
Eosinophils Absolute: 0.2 10*3/uL (ref 0.0–0.5)
Eosinophils Relative: 3 %
HCT: 35 % — ABNORMAL LOW (ref 36.0–46.0)
Hemoglobin: 11 g/dL — ABNORMAL LOW (ref 12.0–15.0)
Immature Granulocytes: 0 %
Lymphocytes Relative: 37 %
Lymphs Abs: 2.4 10*3/uL (ref 0.7–4.0)
MCH: 25.8 pg — ABNORMAL LOW (ref 26.0–34.0)
MCHC: 31.4 g/dL (ref 30.0–36.0)
MCV: 82 fL (ref 80.0–100.0)
Monocytes Absolute: 0.5 10*3/uL (ref 0.1–1.0)
Monocytes Relative: 7 %
Neutro Abs: 3.5 10*3/uL (ref 1.7–7.7)
Neutrophils Relative %: 52 %
Platelets: 200 10*3/uL (ref 150–400)
RBC: 4.27 MIL/uL (ref 3.87–5.11)
RDW: 14.3 % (ref 11.5–15.5)
WBC: 6.6 10*3/uL (ref 4.0–10.5)
nRBC: 0 % (ref 0.0–0.2)

## 2021-11-14 LAB — RESP PANEL BY RT-PCR (FLU A&B, COVID) ARPGX2
Influenza A by PCR: NEGATIVE
Influenza B by PCR: NEGATIVE
SARS Coronavirus 2 by RT PCR: NEGATIVE

## 2021-11-14 LAB — BASIC METABOLIC PANEL
Anion gap: 6 (ref 5–15)
BUN: 17 mg/dL (ref 6–20)
CO2: 26 mmol/L (ref 22–32)
Calcium: 9.4 mg/dL (ref 8.9–10.3)
Chloride: 108 mmol/L (ref 98–111)
Creatinine, Ser: 0.84 mg/dL (ref 0.44–1.00)
GFR, Estimated: >60 mL/min (ref >60)
Glucose, Bld: 81 mg/dL (ref 70–99)
Potassium: 4.4 mmol/L (ref 3.5–5.1)
Sodium: 140 mmol/L (ref 135–145)

## 2021-11-14 LAB — HCG, QUANTITATIVE, PREGNANCY: hCG, Beta Chain, Quant, S: <1 m[IU]/mL (ref <5)

## 2021-11-14 MED ORDER — FENTANYL CITRATE (PF) 100 MCG/2ML IJ SOLN
50.0000 ug | INTRAMUSCULAR | Status: DC | PRN
  Administered 2021-11-14 (×2): 50 ug via INTRAVENOUS
  Filled 2021-11-14 (×2): qty 2

## 2021-11-14 MED ORDER — CLINDAMYCIN PHOSPHATE 900 MG/50ML IV SOLN
900.0000 mg | Freq: Three times a day (TID) | INTRAVENOUS | Status: DC
  Administered 2021-11-14 – 2021-11-16 (×6): 900 mg via INTRAVENOUS
  Filled 2021-11-14 (×6): qty 50

## 2021-11-14 MED ORDER — DULOXETINE HCL 20 MG PO CPEP
60.0000 mg | ORAL_CAPSULE | Freq: Every day | ORAL | Status: DC
  Administered 2021-11-14 – 2021-11-15 (×2): 60 mg via ORAL
  Filled 2021-11-14 (×2): qty 3

## 2021-11-14 MED ORDER — KETOROLAC TROMETHAMINE 30 MG/ML IJ SOLN
30.0000 mg | Freq: Four times a day (QID) | INTRAMUSCULAR | Status: DC
  Administered 2021-11-14 – 2021-11-16 (×8): 30 mg via INTRAVENOUS
  Filled 2021-11-14 (×8): qty 1

## 2021-11-14 MED ORDER — IOHEXOL 9 MG/ML PO SOLN
ORAL | Status: AC
  Administered 2021-11-14: 22:00:00 500 mL
  Filled 2021-11-14: qty 1000

## 2021-11-14 MED ORDER — PROPRANOLOL HCL 20 MG PO TABS
20.0000 mg | ORAL_TABLET | Freq: Two times a day (BID) | ORAL | Status: DC
  Administered 2021-11-14 – 2021-11-16 (×4): 20 mg via ORAL
  Filled 2021-11-14 (×5): qty 1

## 2021-11-14 MED ORDER — HYDROMORPHONE HCL 1 MG/ML IJ SOLN
1.0000 mg | INTRAMUSCULAR | Status: DC | PRN
  Administered 2021-11-14 – 2021-11-15 (×5): 1 mg via INTRAVENOUS
  Filled 2021-11-14 (×5): qty 1

## 2021-11-14 MED ORDER — LACTATED RINGERS IV SOLN: INTRAVENOUS | Status: DC

## 2021-11-14 MED ORDER — SODIUM CHLORIDE 0.9 % IV SOLN
25.0000 mg | Freq: Four times a day (QID) | INTRAVENOUS | Status: DC | PRN
  Administered 2021-11-15 (×2): 25 mg via INTRAVENOUS
  Filled 2021-11-14: qty 1
  Filled 2021-11-14: qty 25

## 2021-11-14 MED ORDER — RISAQUAD PO CAPS
2.0000 | ORAL_CAPSULE | Freq: Every day | ORAL | Status: DC
  Administered 2021-11-14 – 2021-11-15 (×2): 2 via ORAL
  Filled 2021-11-14 (×2): qty 2

## 2021-11-14 MED ORDER — NORETHINDRONE ACETATE 5 MG PO TABS
10.0000 mg | ORAL_TABLET | Freq: Every day | ORAL | Status: DC
  Administered 2021-11-15 – 2021-11-16 (×2): 10 mg via ORAL
  Filled 2021-11-14 (×2): qty 2

## 2021-11-14 MED ORDER — GENTAMICIN SULFATE 40 MG/ML IJ SOLN
5.0000 mg/kg | INTRAVENOUS | Status: DC
  Administered 2021-11-14 – 2021-11-15 (×2): 380 mg via INTRAVENOUS
  Filled 2021-11-14 (×3): qty 9.5

## 2021-11-14 MED ORDER — SODIUM CHLORIDE 0.9 % IV SOLN
2.0000 g | Freq: Four times a day (QID) | INTRAVENOUS | Status: DC
  Administered 2021-11-14 – 2021-11-16 (×8): 2 g via INTRAVENOUS
  Filled 2021-11-14 (×8): qty 2000

## 2021-11-14 NOTE — Progress Notes (Signed)
Pt had ablasion  Oct 12 2021    gastric sleeve  Sep 26 2021    ca on spine  removed  Mar 17 2021

## 2021-11-14 NOTE — Progress Notes (Signed)
Pharmacy Antibiotic Note  Shannon [name] is a 41 y.o. female admitted on 11/14/2021 with endometritis due to s/p ablation on 10/12/21.  Pharmacy has been consulted for Gentamicin dosing.  Plan: Gentamicin 5mg /kg (380mg ) IV q24h using ABW= 75.9kg. Will continue to follow  Height: 5\' 4"  (162.6 cm) Weight: 107.8 kg (237 lb 9.6 oz) IBW/kg (Calculated) : 54.7 Dosing/ABW: 75.9kg  Temp (24hrs), Avg:98.4 F (36.9 C), Min:98.4 F (36.9 C), Max:98.4 F (36.9 C)  No results for input(s): WBC, CREATININE, LATICACIDVEN, VANCOTROUGH, VANCOPEAK, VANCORANDOM, GENTTROUGH, GENTPEAK, GENTRANDOM, TOBRATROUGH, TOBRAPEAK, TOBRARND, AMIKACINPEAK, AMIKACINTROU, AMIKACIN in the last 168 hours.  CrCl cannot be calculated (Patient's most recent lab result is older than the maximum 21 days allowed.).    Allergies  Allergen Reactions   Ceftriaxone Hives and Rash    No SOB - patient willing to try again   Diclofenac Rash   Fluconazole Hives    Raw rash on abdomen and she had sores and swelling in mouth     Pineapple Shortness Of Breath   Latex Rash   Bactrim [Sulfamethoxazole-Trimethoprim] Hives    Antimicrobials this admission: Clindamycin 900mg  IV q8h  1/24 >> Ampicillin 2 gram IV q6h  1/24 >>   Microbiology results:  1/24 UCx:     Thank you for allowing pharmacy to be a part of this patients care.  Vernie Ammons 11/14/2021 6:33 PM

## 2021-11-15 ENCOUNTER — Inpatient Hospital Stay (HOSPITAL_COMMUNITY): Payer: BC Managed Care – PPO

## 2021-11-15 ENCOUNTER — Other Ambulatory Visit: Payer: Self-pay

## 2021-11-15 ENCOUNTER — Encounter (HOSPITAL_COMMUNITY): Payer: Self-pay | Admitting: Obstetrics and Gynecology

## 2021-11-15 MED ORDER — MORPHINE SULFATE (PF) 2 MG/ML IV SOLN
2.0000 mg | INTRAVENOUS | Status: DC | PRN
  Administered 2021-11-15 – 2021-11-16 (×2): 2 mg via INTRAVENOUS
  Filled 2021-11-15 (×2): qty 1

## 2021-11-15 MED ORDER — IOHEXOL 300 MG/ML  SOLN
100.0000 mL | Freq: Once | INTRAMUSCULAR | Status: AC | PRN
  Administered 2021-11-15: 01:00:00 100 mL via INTRAVENOUS

## 2021-11-15 MED ORDER — MORPHINE SULFATE (PF) 4 MG/ML IV SOLN: 4.0000 mg | INTRAVENOUS | Status: DC | PRN

## 2021-11-15 MED ORDER — BISACODYL 10 MG RE SUPP
10.0000 mg | Freq: Once | RECTAL | Status: AC
  Administered 2021-11-15: 21:00:00 10 mg via RECTAL
  Filled 2021-11-15 (×2): qty 1

## 2021-11-15 MED ORDER — ALBUTEROL SULFATE (2.5 MG/3ML) 0.083% IN NEBU: 3.0000 mL | INHALATION_SOLUTION | Freq: Four times a day (QID) | RESPIRATORY_TRACT | Status: DC | PRN

## 2021-11-15 NOTE — H&P (Signed)
Shannon [name] is an 41 y.o. female. Pt seen in the office on Monday c/o pelvic pain.  Exam revealed foul smelling discharge with pelvic pain.  Pt stated the pain was primarily on the left and she had just recently stopped spotting after ablation done on 10/12/21.  She reported no fever, and had a normal appetite, passing flatus and normal BMs.  GC/CT collected although the patient reported she had not had any IC.  Pt started on augmentin for presumptive endometritis and myfembree.  She contacted the office that next morning stating the pain was worse in the LLQ but aside from that nothing else had changed.  GC/CT still pending.  I discussed options with the patient which included admission for IV abx and pain mgmt.  Pt reported she had started the abx but not the myfembree because the pharmacy did not have it.  Hospital contacted and [facility] had no beds but would contact her should one become available which may have been the next day.  Pt agreed to try norethindrone in the meantime and aleve as ibuprofen was not working.  Pt was contacted later in the day for an update on sxs and she said she was still in pain and rated it an 8 or 10 at times and nausea with the pain.  She denied vomiting.  I contacted OB specialty care and they were able to get her a bed.  Orders were given upon her arrival.  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    Vitamin D deficiency    Wears contact lenses    Wears glasses     Past Surgical History:  Procedure Laterality Date   BIOPSY  07/25/2021   Procedure: BIOPSY;  Surgeon: Felicie Morn, MD;  Location: [facility];  Service: General;;   [place] N/A 10/12/2021   Procedure: DILATATION & CURETTAGE/HYSTEROSCOPY;  Surgeon: Everett Graff, MD;  Location: [facility];  Service: Gynecology;  Laterality: N/A;   DILATATION & CURRETTAGE/HYSTEROSCOPY WITH RESECTOCOPE N/A 02/03/2014   Procedure: DILATATION & CURETTAGE/HYSTEROSCOPY ;  Surgeon: Delice Lesch, MD;  Location: [facility];  Service: Gynecology;  Laterality: N/A;   DILATION AND CURETTAGE OF UTERUS  2001   ENDOMETRIAL ABLATION N/A 10/12/2021   Procedure: ENDOMETRIAL ABLATION;  Surgeon: Everett Graff, MD;  Location: [facility];  Service: Gynecology;  Laterality: N/A;   ESOPHAGOGASTRODUODENOSCOPY N/A 07/25/2021   Procedure: ESOPHAGOGASTRODUODENOSCOPY (EGD);  Surgeon: Felicie Morn, MD;  Location: [facility];  Service: General;  Laterality: N/A;   LAMINECTOMY N/A 03/17/2021   Procedure: Laminectomy - Lumbar Four-Lumbar Five for Intradural mass;  Surgeon: Kary Kos, MD;  Location: [facility];  Service: Neurosurgery;  Laterality: N/A;  3C   LAPAROSCOPIC GASTRIC SLEEVE RESECTION  09/26/2021   MYOMECTOMY N/A 02/03/2014   Procedure:  MYOMECTOMY ;  Surgeon: Delice Lesch, MD;  Location: [facility];  Service: Gynecology;  Laterality: N/A;   SALPINGECTOMY  2016   TONSILLECTOMY     age 31   TUBAL LIGATION N/A 08/19/2015   Procedure: POST PARTUM TUBAL LIGATION;  Surgeon: Everett Graff, MD;  Location: [facility];  Service: Gynecology;  Laterality: N/A;   UPPER GI ENDOSCOPY N/A 09/26/2021   Procedure: UPPER GI ENDOSCOPY;  Surgeon: Felicie Morn, MD;  Location: [facility];  Service: General;  Laterality: N/A;   WISDOM TOOTH EXTRACTION     age 78    Family History  Problem  Relation Age of Onset   Hypertension Mother    Diabetes Mother    Mental illness Mother    Hyperlipidemia Mother    Stroke Mother    Depression Mother    Anxiety disorder Mother    Bipolar disorder Mother    Liver disease Mother    Sleep apnea Mother    Obesity Mother    Hypertension Father    Asthma Brother    Seizures Brother    Pancreatic cancer Maternal Aunt 84   Lung cancer Maternal Aunt        dx after 47; smoking hx   Liver cancer Maternal Uncle        dx after 57   Breast cancer Paternal Aunt        two paternal aunts, age 36s-40s   Ovarian cancer Paternal Aunt        two paternal aunts, reported BRCA mutation   Colon polyps Paternal Uncle        two paternal uncles; more than 43 lifetime polyps   Heart disease Maternal Grandmother    Hypertension Maternal Grandmother    Breast cancer Maternal Grandmother        dx 30s-40s; d. 60   Ovarian cancer Paternal Grandmother    Ovarian cancer Other        PGM's mother   Breast cancer Cousin        dx 91s   Ovarian cancer Cousin        dx 43s    Social History:  reports that she has never smoked. She has never used smokeless tobacco. She reports that she does not drink alcohol and does not use drugs.  Allergies:  Allergies  Allergen Reactions   Ceftriaxone Hives and Rash    No SOB - patient willing to try again   Diclofenac Rash   Fluconazole Hives    Raw rash on abdomen and she had sores and swelling in mouth     Pineapple Shortness Of Breath   Latex Rash   Bactrim [Sulfamethoxazole-Trimethoprim] Hives    Medications Prior to Admission  Medication Sig Dispense Refill Last Dose   albuterol (VENTOLIN HFA) 108 (90 Base) MCG/ACT inhaler Inhale 2 puffs into the lungs every 6 (six) hours as needed for wheezing or shortness of breath. 1 each 3 Past Month   amoxicillin-clavulanate (AUGMENTIN) 875-125 MG tablet Take 1 tablet by mouth 2 (two) times daily.   11/13/2021   baclofen (LIORESAL) 10 MG tablet Take 0.5-1  tablets (5-10 mg total) by mouth 3 (three) times daily as needed for muscle spasms. 90 each 5 unk   Calcium Citrate-Vitamin D (CELEBRATE CALCIUM CITRATE) 500-12.5 MG-MCG CHEW Chew by mouth in the morning, at noon, and at bedtime.   11/13/2021   DULoxetine (CYMBALTA) 60 MG capsule Take 60 mg by mouth at bedtime.   11/13/2021   ibuprofen (  ADVIL) 600 MG tablet Take 1 tablet (600 mg total) by mouth every 8 (eight) hours as needed. 30 tablet 1 unk   Multiple Vitamins-Minerals (CELEBRATE MULTI-COMPLETE 18) CHEW Chew by mouth daily.   11/13/2021   naproxen sodium (ALEVE) 220 MG tablet Take 220 mg by mouth once.   11/13/2021   ondansetron (ZOFRAN-ODT) 4 MG disintegrating tablet Dissolve 1 tablet in mouth every 6 hours as needed for nausea or vomiting. 20 tablet 0 unk   oxyCODONE-acetaminophen (PERCOCET) 5-325 MG tablet Take 1 tablet by mouth every 6 (six) hours as needed for severe pain. Do not take with Ambien/Zolpidem 10 tablet 0 unk   pantoprazole (PROTONIX) 40 MG tablet Take 1 tablet  by mouth daily. (Patient taking differently: Take 40 mg by mouth as needed.) 90 tablet 0 Past Month   propranolol (INDERAL) 20 MG tablet Take 1 tablet by mouth twice daily (Patient taking differently: Take 40 mg by mouth at bedtime.) 60 tablet 0 11/13/2021   VYVANSE 70 MG capsule Take 70 mg by mouth daily as needed (work to focus).   10/23/2021   zolpidem (AMBIEN) 10 MG tablet Take 10 mg by mouth at bedtime as needed for sleep.   11/13/2021    Review of Systems  Blood pressure 109/69, pulse 71, temperature 98 F (36.7 C), temperature source Oral, resp. rate 18, height $RemoveBe'5\' 4"'pZfCRuGjR$  (1.626 m), weight 107.8 kg, SpO2 100 %, unknown if currently breastfeeding. Physical Exam  Results for orders placed or performed during the hospital encounter of 11/14/21 (from the past 24 hour(s))  CBC with Differential/Platelet     Status: Abnormal   Collection Time: 11/14/21  6:39 PM  Result Value Ref Range   WBC 6.6 4.0 - 10.5 K/uL   RBC 4.27 3.87  - 5.11 MIL/uL   Hemoglobin 11.0 (L) 12.0 - 15.0 g/dL   HCT 35.0 (L) 36.0 - 46.0 %   MCV 82.0 80.0 - 100.0 fL   MCH 25.8 (L) 26.0 - 34.0 pg   MCHC 31.4 30.0 - 36.0 g/dL   RDW 14.3 11.5 - 15.5 %   Platelets 200 150 - 400 K/uL   nRBC 0.0 0.0 - 0.2 %   Neutrophils Relative % 52 %   Neutro Abs 3.5 1.7 - 7.7 K/uL   Lymphocytes Relative 37 %   Lymphs Abs 2.4 0.7 - 4.0 K/uL   Monocytes Relative 7 %   Monocytes Absolute 0.5 0.1 - 1.0 K/uL   Eosinophils Relative 3 %   Eosinophils Absolute 0.2 0.0 - 0.5 K/uL   Basophils Relative 1 %   Basophils Absolute 0.0 0.0 - 0.1 K/uL   Immature Granulocytes 0 %   Abs Immature Granulocytes 0.01 0.00 - 0.07 K/uL  Basic metabolic panel     Status: None   Collection Time: 11/14/21  6:39 PM  Result Value Ref Range   Sodium 140 135 - 145 mmol/L   Potassium 4.4 3.5 - 5.1 mmol/L   Chloride 108 98 - 111 mmol/L   CO2 26 22 - 32 mmol/L   Glucose, Bld 81 70 - 99 mg/dL   BUN 17 6 - 20 mg/dL   Creatinine, Ser 0.84 0.44 - 1.00 mg/dL   Calcium 9.4 8.9 - 10.3 mg/dL   GFR, Estimated >60 >60 mL/min   Anion gap 6 5 - 15  hCG, quantitative, pregnancy     Status: None   Collection Time: 11/14/21  6:39 PM  Result Value Ref Range   hCG, Beta Chain, Quant, S <1 <  5 mIU/mL  Urinalysis, Routine w reflex microscopic Urine, Clean Catch     Status: Abnormal   Collection Time: 11/14/21  8:42 PM  Result Value Ref Range   Color, Urine YELLOW YELLOW   APPearance HAZY (A) CLEAR   Specific Gravity, Urine 1.029 1.005 - 1.030   pH 5.0 5.0 - 8.0   Glucose, UA NEGATIVE NEGATIVE mg/dL   Hgb urine dipstick NEGATIVE NEGATIVE   Bilirubin Urine NEGATIVE NEGATIVE   Ketones, ur 5 (A) NEGATIVE mg/dL   Protein, ur NEGATIVE NEGATIVE mg/dL   Nitrite NEGATIVE NEGATIVE   Leukocytes,Ua SMALL (A) NEGATIVE   RBC / HPF 0-5 0 - 5 RBC/hpf   WBC, UA 0-5 0 - 5 WBC/hpf   Bacteria, UA NONE SEEN NONE SEEN   Squamous Epithelial / LPF 0-5 0 - 5   Mucus PRESENT    Hyaline Casts, UA PRESENT     CT  ABDOMEN PELVIS W CONTRAST  Result Date: 11/15/2021 CLINICAL DATA:  Pelvic pain. EXAM: CT ABDOMEN AND PELVIS WITH CONTRAST TECHNIQUE: Multidetector CT imaging of the abdomen and pelvis was performed using the standard protocol following bolus administration of intravenous contrast. RADIATION DOSE REDUCTION: This exam was performed according to the departmental dose-optimization program which includes automated exposure control, adjustment of the mA and/or kV according to patient size and/or use of iterative reconstruction technique. CONTRAST:  147m OMNIPAQUE IOHEXOL 300 MG/ML  SOLN COMPARISON:  CT abdomen pelvis dated 12/23/2013. FINDINGS: Lower chest: The visualized lung bases are clear. No intra-abdominal free air.  Small free fluid within the pelvis. Hepatobiliary: No focal liver abnormality is seen. No gallstones, gallbladder wall thickening, or biliary dilatation. Pancreas: Unremarkable. No pancreatic ductal dilatation or surrounding inflammatory changes. Spleen: Normal in size without focal abnormality. Adrenals/Urinary Tract: Several small nonobstructing bilateral renal calculi measure up to 3 mm in the inferior pole of the left kidney. There is no hydronephrosis on either side. There is symmetric enhancement and excretion of contrast by both kidneys. The visualized ureters and urinary bladder appear unremarkable. Stomach/Bowel: Postsurgical changes of gastric sleeve. There is moderate stool throughout the colon. There is no bowel obstruction or active inflammation. The appendix is normal. Vascular/Lymphatic: The abdominal aorta and IVC are unremarkable. No portal venous gas. There is no adenopathy. Reproductive: The uterus is anteverted. Small uterine fibroids. Bilateral ovarian cysts or follicles measure up to 3.5 cm on the left. No follow-up imaging recommended. Note: This recommendation does not apply to premenarchal patients and to those with increased risk (genetic, family history, elevated tumor  markers or other high-risk factors) of ovarian cancer. Reference: JACR 2020 Feb; 17(2):248-254 Other: None Musculoskeletal: Lower lumbar laminectomy. No acute osseous pathology. IMPRESSION: 1. No acute intra-abdominal or pelvic pathology. 2. Small nonobstructing bilateral renal calculi. No hydronephrosis. 3. Bilateral ovarian cysts or follicles measure up to 3.5 cm on the left. Electronically Signed   By: AAnner CreteM.D.   On: 11/15/2021 01:19    Assessment/Plan: Pt admitted for pelvic pain with presumption of endometritis IV abx (Amp/Gent/Clinda) Pain medication Probiotic per pt request d/t propensity for yeast infections with abx (allergy to diflucan) Radiologist contacted for best imaging study and recommended CT scan of abdomen and pelvis with IV and po contrast - ordered    ADelice Lesch1/25/2023, 6:15 PM

## 2021-11-15 NOTE — Progress Notes (Addendum)
Subjective: Patient reports tolerating PO, + flatus, and no problems voiding.  Pt reports no BM today.  Results of CT scan reviewed with the patient by phone upon her return to floor last night.  Pt says the pain persists at a 5.  It eases off but returns.  Objective: I have reviewed patient's vital signs, medications, and radiology results.  General: alert and no distress Resp: clear to auscultation bilaterally Cardio: regular rate and rhythm GI: soft, LLQ tenderness with guarding no rebound Extremities: no calf tenderness No vaginal bleeding and no discharge   Assessment/Plan: Presumed endometritis Pain not completely controlled with 1mg  dilaudid and scheduled toradol Will try morphine instead GC/CT still pending from office Ucx pending Cont IV abs Dulcolax supp  LOS: 1 day    Shannon [name] 11/15/2021, 6:38 PM

## 2021-11-16 LAB — URINE CULTURE
Culture: NO GROWTH
Special Requests: NORMAL

## 2021-11-16 MED ORDER — ALBUTEROL SULFATE (2.5 MG/3ML) 0.083% IN NEBU: 2.5000 mg | INHALATION_SOLUTION | RESPIRATORY_TRACT | Status: DC | PRN

## 2021-11-16 MED ORDER — OXYCODONE-ACETAMINOPHEN 5-325 MG PO TABS
1.0000 | ORAL_TABLET | Freq: Four times a day (QID) | ORAL | 0 refills | Status: DC | PRN
Start: 2021-11-16 — End: 2022-01-17

## 2021-11-16 NOTE — Discharge Summary (Signed)
Physician Discharge Summary  Patient ID: Shannon [name] MRN: [id number] DOB/AGE: [birth year] 41 y.o.  Admit date: 11/14/2021 Discharge date: 11/16/2021  Admission Diagnoses: Pelvic Pain Presumed Endometritis  Discharge Diagnoses:  Principal Problem:   Pelvic pain   Discharged Condition: Improved  Hospital Course: IV A/G/C, pain medication and laxative for about 48hrs with minimal to no pain upon discharge.  Discharge instructions reviewed.  Consults: None  Significant Diagnostic Studies: CT scan abdomen and pelvis overall wnl.  Nonobstucting kidney stones and left follicles reviewed with patient.  Treatments: IV hydration, antibiotics: gentamycin and ampicillin and clindamycin, and analgesia: Morphine  Discharge Exam: Blood pressure 118/74, pulse 83, temperature 98.4 F (36.9 C), temperature source Oral, resp. rate 18, height 5\' 4"  (1.626 m), weight 107.8 kg, SpO2 98 %, unknown if currently breastfeeding. General appearance: alert and no distress Resp: clear to auscultation bilaterally Cardio: regular rate and rhythm GI: soft, non-tender Extremities: no calf tenderness  Disposition: Discharge disposition: 01-Home or Self Care        Allergies as of 11/16/2021       Reactions   Ceftriaxone Hives, Rash   No SOB - patient willing to try again   Diclofenac Rash   Fluconazole Hives   Raw rash on abdomen and she had sores and swelling in mouth    Pineapple Shortness Of Breath   Latex Rash   Bactrim [sulfamethoxazole-trimethoprim] Hives        Medication List     STOP taking these medications    zolpidem 10 MG tablet Commonly known as: AMBIEN       TAKE these medications    albuterol 108 (90 Base) MCG/ACT inhaler Commonly known as: VENTOLIN HFA Inhale 2 puffs into the lungs every 6 (six) hours as needed for wheezing or shortness of breath.   amoxicillin-clavulanate 875-125 MG tablet Commonly known as: AUGMENTIN Take 1 tablet by mouth 2 (two) times  daily.   baclofen 10 MG tablet Commonly known as: LIORESAL Take 0.5-1 tablets (5-10 mg total) by mouth 3 (three) times daily as needed for muscle spasms.   Celebrate Calcium Citrate 500-12.5 MG-MCG Chew Generic drug: Calcium Citrate-Vitamin D Chew by mouth in the morning, at noon, and at bedtime.   Celebrate Multi-Complete 18 Chew Chew by mouth daily.   DULoxetine 60 MG capsule Commonly known as: CYMBALTA Take 60 mg by mouth at bedtime.   ibuprofen 600 MG tablet Commonly known as: ADVIL Take 1 tablet (600 mg total) by mouth every 8 (eight) hours as needed.   naproxen sodium 220 MG tablet Commonly known as: ALEVE Take 220 mg by mouth once.   ondansetron 4 MG disintegrating tablet Commonly known as: ZOFRAN-ODT Dissolve 1 tablet in mouth every 6 hours as needed for nausea or vomiting.   oxyCODONE-acetaminophen 5-325 MG tablet Commonly known as: Percocet Take 1 tablet by mouth every 6 (six) hours as needed for severe pain. Do not take with Ambien/Zolpidem   pantoprazole 40 MG tablet Commonly known as: PROTONIX Take 1 tablet  by mouth daily. What changed:  when to take this reasons to take this   propranolol 20 MG tablet Commonly known as: INDERAL Take 1 tablet by mouth twice daily What changed:  how much to take when to take this   Vyvanse 70 MG capsule Generic drug: lisdexamfetamine Take 70 mg by mouth daily as needed (work to focus).        Follow-up Information     Everett Graff, MD. Schedule an appointment as soon as possible  for a visit in 1 week(s).   Specialty: Obstetrics and Gynecology Why: call to schedule appointment in 1 wk Contact information: [address] [place] [address] [phone]                 Signed: Delice Lesch 11/16/2021, 6:15 PM

## 2021-11-17 ENCOUNTER — Telehealth: Payer: Self-pay

## 2021-11-17 NOTE — Telephone Encounter (Signed)
Patient notified of completion of Form 7A and form 703. Fax transmission confirmation received and copy of forms mailed to Patient as requested.

## 2021-11-21 ENCOUNTER — Ambulatory Visit: Payer: BC Managed Care – PPO | Admitting: Skilled Nursing Facility1

## 2021-11-23 ENCOUNTER — Encounter: Payer: BC Managed Care – PPO | Attending: Surgery | Admitting: Skilled Nursing Facility1

## 2021-11-23 ENCOUNTER — Other Ambulatory Visit: Payer: Self-pay

## 2021-11-23 ENCOUNTER — Other Ambulatory Visit (HOSPITAL_COMMUNITY): Payer: Self-pay

## 2021-11-23 DIAGNOSIS — E669 Obesity, unspecified: Secondary | ICD-10-CM | POA: Insufficient documentation

## 2021-11-23 NOTE — Progress Notes (Signed)
Bariatric Nutrition Follow-Up Visit Medical Nutrition Therapy    NUTRITION ASSESSMENT   Surgery date: 09/26/2021 Surgery type: sleeve Start weight at [facility]: 253 Weight today: 233.1 pounds   Body Composition Scale 10/11/2021 11/23/2021  Current Body Weight 239.3 233.1  Total Body Fat % 43.8 42.9  Visceral Fat 14 13  Fat-Free Mass % 56.1 57   Total Body Water % 42.5 43  Muscle-Mass lbs 32.1 32.4  BMI 41 39.2  Body Fat Displacement           Torso  lbs 65.1 61.9         Left Leg  lbs 13 12.3         Right Leg  lbs 13 12.3         Left Arm  lbs 6.5 6.1         Right Arm   lbs 6.5 6.1   Clinical  Medical hx: ADD, anxiety, depression Medications: see list  Labs: hemoglobin 11, HCT 35, MCH 25.8 Notable signs/symptoms: N/A Any previous deficiencies? No   Lifestyle & Dietary Hx  Pt states she got an ablation for fibroids and need up having an infection.  Pt states she is still feeling tired and in pain. Pt states they have to wait 6 weeks to if the bleeding has calmed after her menstrual cycle.  Pt states she has been constipated. Pt states she has been having some reflux mostly with spicy foods.  Pt states it has been difficult to remember to drink throughout the day with timing of meals.  Pt states she was too bored in the phase so she has moved on to other foods.  Pt states she does eat in the middle of the night things such as goldfish.   Estimated daily fluid intake: 64 oz Estimated daily protein intake: 80+ g Supplements: multi and calcium  Current average weekly physical activity: Low key due to fibroids    24-Hr Dietary Recall First Meal: ensure protein shake Snack:  tuna + crackers Second Meal: [place] and lettuce wrap Snack: almonds Third Meal: protein shake Snack: goldfish Beverages: water, propel, gatorade g2  Post-Op Goals/ Signs/ Symptoms Using straws: no Drinking while eating: no Chewing/swallowing difficulties: no Changes in vision: no Changes to  mood/headaches: no Hair loss/changes to skin/nails: no Difficulty focusing/concentrating: no Sweating: no Limb weakness: no Dizziness/lightheadedness: no Palpitations: no  Carbonated/caffeinated beverages: no N/V/D/C/Gas: no Abdominal pain: no Dumping syndrome: no    NUTRITION DIAGNOSIS  Overweight/obesity ([place]-3.3) related to past poor dietary habits and physical inactivity as evidenced by completed bariatric surgery and following dietary guidelines for continued weight loss and healthy nutrition status.     NUTRITION INTERVENTION Nutrition counseling (C-1) and education (E-2) to facilitate bariatric surgery goals, including: The importance of consuming adequate calories as well as certain nutrients daily due to the body's need for essential vitamins, minerals, and fats The importance of daily physical activity and to reach a goal of at least 150 minutes of moderate to vigorous physical activity weekly (or as directed by their physician) due to benefits such as increased musculature and improved lab values The importance of intuitive eating specifically learning hunger-satiety cues and understanding the importance of learning a new body: The importance of mindful eating to avoid grazing behaviors  Importance of vegetables To have an overall healthy diet, adult men and women are recommended to consume anywhere from 2-3 cups of vegetables daily. Vegetables provide a wide range of vitamins and minerals such as vitamin A, vitamin  C, potassium, and folic acid. According to the [facility], including fruit and vegetables daily may reduce the risk of cardiovascular disease, certain cancers, and other non-communicable diseases. When should you have a bowel movement?: Everyone has their own bowel regimen, some people have a few bowel movements a day whereas other may go every 2-3 days. According to the [employer], most people have 3 bowel  movements a week and anything less can be alerting. When referring to the [facility] Stool Chart; type 1-2 can indicate constipation, 3-4 are ideal, and 5-7 may indicate diarrhea.  https://aboutconstipation.org/stool-form-guide.html, http://www.murphy-norris.com/.pdf  Fiber recommendations: Adults need anywhere from 20-30 grams of fiber daily to ensure adequate intake. Both kinds of fiber, insoluble and soluble, provide health benefits to the body. Insoluble fiber does not dissolve in water and thus will help move digested food through the GI tract, promoting regular bowel movements and preventing constipation. Soluble fiber does dissolve in water and will help lower blood glucose and cholesterol levels. An increased intake of fiber may decrease the risk for cardiovascular disease and will help people with type 2 diabetes control their blood glucose levels.  NationalDirectors.dk, RenoLenders.fr  Handouts Provided Include  Phase 4  Goals: -Continue to aim for a minimum of 64 fluid ounces 7 days a week with at least 30 ounces being plain water  -Eat non-starchy vegetables 2 times a day 7 days a week  -Start out with soft cooked vegetables today and tomorrow; if tolerated begin to eat raw vegetables or cooked including salads  -Eat your 3 ounces of protein first then start in on your non-starchy vegetables; once you understand how much of your meal leads to satisfaction and not full while still eating 3 ounces of protein and non-starchy vegetables you can eat them in any order   -Continue to aim for 30 minutes of activity at least 5 times a week  -Do NOT cook with/add to your food: alfredo sauce, cheese sauce, barbeque sauce, ketchup, fat back, butter, bacon grease, grease, Crisco, OR SUGAR  -try a spar day with relaxing music and a body scrub to  help relax at the end of the day   Learning Style & Readiness for Change Teaching method utilized: Visual & Auditory  Demonstrated degree of understanding via: Teach Back  Readiness Level: action Barriers to learning/adherence to lifestyle change: pain and fatigue  RD's Notes for Next Visit Assess adherence to pt chosen goals    MONITORING & EVALUATION Dietary intake, weekly physical activity, body weight  Next Steps Patient is to follow-up At the end of april

## 2021-11-24 ENCOUNTER — Encounter
Payer: BC Managed Care – PPO | Attending: Physical Medicine and Rehabilitation | Admitting: Physical Medicine and Rehabilitation

## 2021-11-24 DIAGNOSIS — R000 Tachycardia, unspecified: Secondary | ICD-10-CM | POA: Insufficient documentation

## 2021-11-24 DIAGNOSIS — M545 Low back pain, unspecified: Secondary | ICD-10-CM | POA: Insufficient documentation

## 2021-11-24 DIAGNOSIS — R61 Generalized hyperhidrosis: Secondary | ICD-10-CM | POA: Insufficient documentation

## 2021-11-24 DIAGNOSIS — M7918 Myalgia, other site: Secondary | ICD-10-CM | POA: Insufficient documentation

## 2021-11-24 DIAGNOSIS — D447 Neoplasm of uncertain behavior of aortic body and other paraganglia: Secondary | ICD-10-CM | POA: Insufficient documentation

## 2021-11-24 DIAGNOSIS — G8929 Other chronic pain: Secondary | ICD-10-CM | POA: Insufficient documentation

## 2021-11-27 ENCOUNTER — Other Ambulatory Visit: Payer: Self-pay | Admitting: Radiation Therapy

## 2021-12-04 ENCOUNTER — Other Ambulatory Visit: Payer: Self-pay | Admitting: Internal Medicine

## 2021-12-08 ENCOUNTER — Ambulatory Visit (HOSPITAL_COMMUNITY): Admission: RE | Admit: 2021-12-08 | Payer: BC Managed Care – PPO | Source: Ambulatory Visit

## 2021-12-11 ENCOUNTER — Ambulatory Visit (HOSPITAL_COMMUNITY)
Admission: RE | Admit: 2021-12-11 | Discharge: 2021-12-11 | Disposition: A | Discharge status: Home / Self Care | Payer: BC Managed Care – PPO | Source: Ambulatory Visit | Attending: Internal Medicine | Admitting: Internal Medicine

## 2021-12-11 ENCOUNTER — Other Ambulatory Visit: Payer: Self-pay

## 2021-12-11 DIAGNOSIS — D447 Neoplasm of uncertain behavior of aortic body and other paraganglia: Secondary | ICD-10-CM | POA: Diagnosis not present

## 2021-12-11 MED ORDER — GADOBUTROL 1 MMOL/ML IV SOLN
10.0000 mL | Freq: Once | INTRAVENOUS | Status: AC | PRN
  Administered 2021-12-11: 10 mL via INTRAVENOUS

## 2021-12-12 ENCOUNTER — Inpatient Hospital Stay: Payer: BC Managed Care – PPO | Attending: Internal Medicine | Admitting: Internal Medicine

## 2021-12-12 VITALS — BP 118/82 | HR 85 | Temp 98.1°F | Resp 17 | Ht 64.5 in | Wt 231.1 lb

## 2021-12-12 DIAGNOSIS — Z79899 Other long term (current) drug therapy: Secondary | ICD-10-CM | POA: Diagnosis not present

## 2021-12-12 DIAGNOSIS — D447 Neoplasm of uncertain behavior of aortic body and other paraganglia: Secondary | ICD-10-CM | POA: Diagnosis not present

## 2021-12-12 NOTE — Progress Notes (Signed)
[facility] [place], [place] [address] [phone]   Interval Evaluation  Date of Service: 12/12/21 Patient Name: Denaisha Swango Patient MRN: [id number] Patient DOB: 06-Jan-1981 Provider: Ventura Sellers, MD  Identifying Statement:  Everette Dimauro is a 41 y.o. female with  cauda equina   paraganglioma    Oncologic History 03/17/21: Laminectomy, resection by Dr. Saintclair Halsted.  Path demonstrates paraganglioma  Interval History: Aletta Wickizer presents today after MRI spine.  She has no new or progressive complaints today.  Recently had gastric sleeve placed, she has begun to lose additional weight.  Propanolol remains effective for nocturnal symptoms and headaches.  H+P (04/13/21) Patient presented to medical attention in May 2022 with several months history of severe, refractory lower back pain.  Initially treated for sciatica, she eventually obtained an MRI which demonstrated a tumor within the roots of the cauda equina.  She also has complained of headaches, excess sweating, palpitations at times.  She underwent laminectomy and resection of the lesion with Dr. Saintclair Halsted on 5/27, path demonstrated paraganglioma.  Since then her pain is significantly improved.  There was some weakness after surgery but it improved with short rehab stint.  Over past month, no new or progressive deficits.  Medications: Current Outpatient Medications on File Prior to Visit  Medication Sig Dispense Refill   albuterol (VENTOLIN HFA) 108 (90 Base) MCG/ACT inhaler Inhale 2 puffs into the lungs every 6 (six) hours as needed for wheezing or shortness of breath. 1 each 3   amoxicillin-clavulanate (AUGMENTIN) 875-125 MG tablet Take 1 tablet by mouth 2 (two) times daily.     baclofen (LIORESAL) 10 MG tablet Take 0.5-1 tablets (5-10 mg total) by mouth 3 (three) times daily as needed for muscle spasms. 90 each 5   Calcium Citrate-Vitamin D (CELEBRATE CALCIUM CITRATE) 500-12.5 MG-MCG  CHEW Chew by mouth in the morning, at noon, and at bedtime.     DULoxetine (CYMBALTA) 60 MG capsule Take 60 mg by mouth at bedtime.     ibuprofen (ADVIL) 600 MG tablet Take 1 tablet (600 mg total) by mouth every 8 (eight) hours as needed. 30 tablet 1   Multiple Vitamins-Minerals (CELEBRATE MULTI-COMPLETE 18) CHEW Chew by mouth daily.     naproxen sodium (ALEVE) 220 MG tablet Take 220 mg by mouth once.     norethindrone (AYGESTIN) 5 MG tablet norethindrone acetate 5 mg tablet     ondansetron (ZOFRAN-ODT) 4 MG disintegrating tablet Dissolve 1 tablet in mouth every 6 hours as needed for nausea or vomiting. 20 tablet 0   oxyCODONE-acetaminophen (PERCOCET) 5-325 MG tablet Take 1 tablet by mouth every 6 (six) hours as needed for severe pain. Do not take with Ambien/Zolpidem 12 tablet 0   pantoprazole (PROTONIX) 40 MG tablet Take 1 tablet  by mouth daily. (Patient taking differently: Take 40 mg by mouth as needed.) 90 tablet 0   propranolol (INDERAL) 20 MG tablet Take 1 tablet by mouth twice daily 60 tablet 3   Relugolix-Estradiol-Norethind (MYFEMBREE) 40-1-0.5 MG TABS Myfembree 40 mg-1 mg-0.5 mg tablet     VYVANSE 70 MG capsule Take 70 mg by mouth daily as needed (work to focus).     zolpidem (AMBIEN) 10 MG tablet Take 10 mg by mouth at bedtime as needed.     No current facility-administered medications on file prior to visit.    Allergies:  Allergies  Allergen Reactions   Ceftriaxone Hives and Rash    No SOB -  patient willing to try again   Diclofenac Rash   Fluconazole Hives    Raw rash on abdomen and she had sores and swelling in mouth     Pineapple Shortness Of Breath   Latex Rash   Bactrim [Sulfamethoxazole-Trimethoprim] Hives   Past Medical History:  Past Medical History:  Diagnosis Date   Acid reflux 2022   ADD (attention deficit disorder)    Anemia 08/23/2021   on 09/27/21 HGB was 10.8 (Epic)   Anxiety    Asthma    Back pain    Depression    Family history of breast cancer  04/28/2021   Family history of ovarian cancer 04/28/2021   Family history of pancreatic cancer 04/28/2021   Hyperlipidemia    Hypertension    Preeclampsia post delivery   Multiple food allergies    pineapple and sometimes egg derivatives   Obesity    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   Vitamin D deficiency    Wears contact lenses    Wears glasses    Past Surgical History:  Past Surgical History:  Procedure Laterality Date   BIOPSY  07/25/2021   Procedure: BIOPSY;  Surgeon: Felicie Morn, MD;  Location: [facility];  Service: General;;   [place] N/A 10/12/2021   Procedure: DILATATION & CURETTAGE/HYSTEROSCOPY;  Surgeon: Everett Graff, MD;  Location: [facility];  Service: Gynecology;  Laterality: N/A;   DILATATION & CURRETTAGE/HYSTEROSCOPY WITH RESECTOCOPE N/A 02/03/2014   Procedure: DILATATION & CURETTAGE/HYSTEROSCOPY ;  Surgeon: Delice Lesch, MD;  Location: [facility];  Service: Gynecology;  Laterality: N/A;   DILATION AND CURETTAGE OF UTERUS  2001   ENDOMETRIAL ABLATION N/A 10/12/2021   Procedure: ENDOMETRIAL ABLATION;  Surgeon: Everett Graff, MD;  Location: [facility];  Service: Gynecology;  Laterality: N/A;   ESOPHAGOGASTRODUODENOSCOPY N/A 07/25/2021   Procedure: ESOPHAGOGASTRODUODENOSCOPY (EGD);  Surgeon: Felicie Morn, MD;  Location: [facility];  Service: General;  Laterality: N/A;   LAMINECTOMY N/A 03/17/2021   Procedure: Laminectomy - Lumbar Four-Lumbar Five for Intradural mass;  Surgeon: Kary Kos, MD;  Location: [facility];  Service: Neurosurgery;  Laterality: N/A;  3C   LAPAROSCOPIC GASTRIC SLEEVE RESECTION  09/26/2021   MYOMECTOMY N/A 02/03/2014   Procedure: MYOMECTOMY ;  Surgeon: Delice Lesch, MD;  Location: [facility];  Service: Gynecology;  Laterality: N/A;   SALPINGECTOMY  2016   TONSILLECTOMY     age 54   TUBAL LIGATION N/A 08/19/2015   Procedure: POST PARTUM TUBAL  LIGATION;  Surgeon: Everett Graff, MD;  Location: [facility];  Service: Gynecology;  Laterality: N/A;   UPPER GI ENDOSCOPY N/A 09/26/2021   Procedure: UPPER GI ENDOSCOPY;  Surgeon: Felicie Morn, MD;  Location: [facility];  Service: General;  Laterality: N/A;   WISDOM TOOTH EXTRACTION     age 37   Social History:  Social History   Socioeconomic History   Marital status: Legally Separated    Spouse name: Not on file   Number of children: Not on file   Years of education: Not on file   Highest education level: Not on file  Occupational History   Not on file  Tobacco Use   Smoking status: Never   Smokeless tobacco: Never  Vaping Use   Vaping Use: Never used  Substance and Sexual Activity   Alcohol use: Never   Drug use: No   Sexual activity: Yes    Birth control/protection: None  Other Topics Concern  Not on file  Social History Narrative   Not on file   Social Determinants of Health   Financial Resource Strain: Not on file  Food Insecurity: Not on file  Transportation Needs: Not on file  Physical Activity: Not on file  Stress: Not on file  Social Connections: Not on file  Intimate Partner Violence: Not on file   Family History:  Family History  Problem Relation Age of Onset   Hypertension Mother    Diabetes Mother    Mental illness Mother    Hyperlipidemia Mother    Stroke Mother    Depression Mother    Anxiety disorder Mother    Bipolar disorder Mother    Liver disease Mother    Sleep apnea Mother    Obesity Mother    Hypertension Father    Asthma Brother    Seizures Brother    Pancreatic cancer Maternal Aunt 33   Lung cancer Maternal Aunt        dx after 8; smoking hx   Liver cancer Maternal Uncle        dx after 5   Breast cancer Paternal Aunt        two paternal aunts, age 37s-40s   Ovarian cancer Paternal 30        two paternal aunts, reported BRCA mutation   Colon polyps Paternal Uncle        two paternal uncles; more than 8 lifetime  polyps   Heart disease Maternal Grandmother    Hypertension Maternal Grandmother    Breast cancer Maternal Grandmother        dx 30s-40s; d. 62   Ovarian cancer Paternal Grandmother    Ovarian cancer Other        PGM's mother   Breast cancer Cousin        dx 98s   Ovarian cancer Cousin        dx 92s    Review of Systems: Constitutional: Doesn't report fevers, chills or abnormal weight loss Eyes: Doesn't report blurriness of vision Ears, nose, mouth, throat, and face: Doesn't report sore throat Respiratory: Doesn't report cough, dyspnea or wheezes Cardiovascular: Doesn't report palpitation, chest discomfort  Gastrointestinal:  Doesn't report nausea, constipation, diarrhea GU: Doesn't report incontinence Skin: Doesn't report skin rashes Neurological: Per HPI Musculoskeletal: Doesn't report joint pain Behavioral/Psych: Doesn't report anxiety  Physical Exam: Vitals:   12/12/21 1215  BP: 118/82  Pulse: 85  Resp: 17  Temp: 98.1 F (36.7 C)  SpO2: 98%   KPS: 80. General: Alert, cooperative, pleasant, in no acute distress Head: Normal EENT: No conjunctival injection or scleral icterus.  Lungs: Resp effort normal Cardiac: Regular rate Abdomen: Non-distended abdomen Skin: No rashes cyanosis or petechiae. Extremities: No clubbing or edema  Neurologic Exam: Mental Status: Awake, alert, attentive to examiner. Oriented to self and environment. Language is fluent with intact comprehension.  Cranial Nerves: Visual acuity is grossly normal. Visual fields are full. Extra-ocular movements intact. No ptosis. Face is symmetric Motor: Tone and bulk are normal. Power is full in both arms and legs. Reflexes are diminished, no pathologic reflexes present.  Sensory: Intact to light touch Gait: Normal.   Labs: I have reviewed the data as listed    Component Value Date/Time   NA 140 11/14/2021 1839   NA 140 06/07/2021 0000   K 4.4 11/14/2021 1839   CL 108 11/14/2021 1839   CO2 26  11/14/2021 1839   GLUCOSE 81 11/14/2021 1839   BUN 17 11/14/2021 1839  BUN 9 06/07/2021 0000   CREATININE 0.84 11/14/2021 1839   CREATININE 0.77 11/20/2012 1535   CALCIUM 9.4 11/14/2021 1839   PROT 6.6 08/23/2021 1601   ALBUMIN 4.1 08/23/2021 1601   AST 19 08/23/2021 1601   ALT 15 08/23/2021 1601   ALKPHOS 111 08/23/2021 1601   BILITOT 0.3 08/23/2021 1601   GFRNONAA >60 11/14/2021 1839   GFRAA 92 08/08/2020 1653   Lab Results  Component Value Date   WBC 6.6 11/14/2021   NEUTROABS 3.5 11/14/2021   HGB 11.0 (L) 11/14/2021   HCT 35.0 (L) 11/14/2021   MCV 82.0 11/14/2021   PLT 200 11/14/2021    Imaging:  [facility] Clinician Interpretation: I have personally reviewed the CNS images as listed.  My interpretation, in the context of the patient's clinical presentation, is stable disease  CT ABDOMEN PELVIS W CONTRAST  Result Date: 11/15/2021 CLINICAL DATA:  Pelvic pain. EXAM: CT ABDOMEN AND PELVIS WITH CONTRAST TECHNIQUE: Multidetector CT imaging of the abdomen and pelvis was performed using the standard protocol following bolus administration of intravenous contrast. RADIATION DOSE REDUCTION: This exam was performed according to the departmental dose-optimization program which includes automated exposure control, adjustment of the mA and/or kV according to patient size and/or use of iterative reconstruction technique. CONTRAST:  168m OMNIPAQUE IOHEXOL 300 MG/ML  SOLN COMPARISON:  CT abdomen pelvis dated 12/23/2013. FINDINGS: Lower chest: The visualized lung bases are clear. No intra-abdominal free air.  Small free fluid within the pelvis. Hepatobiliary: No focal liver abnormality is seen. No gallstones, gallbladder wall thickening, or biliary dilatation. Pancreas: Unremarkable. No pancreatic ductal dilatation or surrounding inflammatory changes. Spleen: Normal in size without focal abnormality. Adrenals/Urinary Tract: Several small nonobstructing bilateral renal calculi measure up to 3 mm in the  inferior pole of the left kidney. There is no hydronephrosis on either side. There is symmetric enhancement and excretion of contrast by both kidneys. The visualized ureters and urinary bladder appear unremarkable. Stomach/Bowel: Postsurgical changes of gastric sleeve. There is moderate stool throughout the colon. There is no bowel obstruction or active inflammation. The appendix is normal. Vascular/Lymphatic: The abdominal aorta and IVC are unremarkable. No portal venous gas. There is no adenopathy. Reproductive: The uterus is anteverted. Small uterine fibroids. Bilateral ovarian cysts or follicles measure up to 3.5 cm on the left. No follow-up imaging recommended. Note: This recommendation does not apply to premenarchal patients and to those with increased risk (genetic, family history, elevated tumor markers or other high-risk factors) of ovarian cancer. Reference: JACR 2020 Feb; 17(2):248-254 Other: None Musculoskeletal: Lower lumbar laminectomy. No acute osseous pathology. IMPRESSION: 1. No acute intra-abdominal or pelvic pathology. 2. Small nonobstructing bilateral renal calculi. No hydronephrosis. 3. Bilateral ovarian cysts or follicles measure up to 3.5 cm on the left. Electronically Signed   By: AAnner CreteM.D.   On: 11/15/2021 01:19    Assessment/Plan [place]ang[place]  Janelle FDegrasseis clinically and radiographically stable today.  L-spine MRI does not demonstrate recurrence of tumor, although official read is still pending.  We appreciate the opportunity to participate in the care of ALatah   She may con't propanolol 467mHS.  We ask that Ruthe FlFloodeturn to clinic in 12 months following next lumbar spine MRI, or sooner as needed.  All questions were answered. The patient knows to call the clinic with any problems, questions or concerns. No barriers to learning were detected.  The total time spent in the encounter was 30 minutes and more than 50% was on  counseling and  review of test results   Ventura Sellers, MD Medical Director of Neuro-Oncology [facility] 12/12/21 1:36 PM

## 2021-12-18 ENCOUNTER — Encounter: Payer: Self-pay | Admitting: Endocrinology

## 2021-12-18 ENCOUNTER — Ambulatory Visit (INDEPENDENT_AMBULATORY_CARE_PROVIDER_SITE_OTHER): Payer: BC Managed Care – PPO | Admitting: Endocrinology

## 2021-12-18 ENCOUNTER — Ambulatory Visit: Payer: BC Managed Care – PPO | Admitting: Internal Medicine

## 2021-12-18 ENCOUNTER — Inpatient Hospital Stay: Payer: BC Managed Care – PPO

## 2021-12-18 ENCOUNTER — Other Ambulatory Visit: Payer: Self-pay

## 2021-12-18 VITALS — BP 118/90 | HR 93 | Ht 64.5 in | Wt 225.8 lb

## 2021-12-18 DIAGNOSIS — L749 Eccrine sweat disorder, unspecified: Secondary | ICD-10-CM | POA: Diagnosis not present

## 2021-12-18 DIAGNOSIS — D447 Neoplasm of uncertain behavior of aortic body and other paraganglia: Secondary | ICD-10-CM | POA: Diagnosis not present

## 2021-12-18 DIAGNOSIS — R635 Abnormal weight gain: Secondary | ICD-10-CM | POA: Diagnosis not present

## 2021-12-18 NOTE — Progress Notes (Signed)
Subjective:    Patient ID: Shannon [name], female    DOB: [birth year], 41 y.o.   MRN: [id number]  HPI Pt is referred by Dr Dagoberto Ligas, for excessive sweating.  she has h/o HTN.  she has no h/o diabetes, migraine headache, cancer, alcohol withdrawal, hemangiomas, brain aneurysm, cocaine use, menopause, or thyroid disease.  She had a paraganglionoma of the spine resected 5/22.  She had muscle jerking prior to the surgery, but not since.  Since then, she has excessive diaphoresis, flushing, HA, anxiety, and palpitations.  She had gastric sleeve 12/22.  Since then, she has lost 20 lbs.  She has had TL.  She resumed work 10/23/21, and since then works only half time. She has not recently taken Vyvanse.   Past Medical History:  Diagnosis Date   Acid reflux 2022   ADD (attention deficit disorder)    Anemia 08/23/2021   on 09/27/21 HGB was 10.8 (Epic)   Anxiety    Asthma    Back pain    Depression    Family history of breast cancer 04/28/2021   Family history of ovarian cancer 04/28/2021   Family history of pancreatic cancer 04/28/2021   Hyperlipidemia    Hypertension    Preeclampsia post delivery   Multiple food allergies    pineapple and sometimes egg derivatives   Obesity    Paraganglioma ([place]) 02/2021   Lumbar   Vitamin D deficiency    Wears contact lenses    Wears glasses     Past Surgical History:  Procedure Laterality Date   BIOPSY  07/25/2021   Procedure: BIOPSY;  Surgeon: Felicie Morn, MD;  Location: [facility];  Service: General;;   [place] N/A 10/12/2021   Procedure: DILATATION & CURETTAGE/HYSTEROSCOPY;  Surgeon: Everett Graff, MD;  Location: [facility];  Service: Gynecology;  Laterality: N/A;   DILATATION & CURRETTAGE/HYSTEROSCOPY WITH RESECTOCOPE N/A 02/03/2014   Procedure: DILATATION & CURETTAGE/HYSTEROSCOPY ;  Surgeon: Delice Lesch, MD;  Location: [facility];  Service: Gynecology;  Laterality: N/A;   DILATION  AND CURETTAGE OF UTERUS  2001   ENDOMETRIAL ABLATION N/A 10/12/2021   Procedure: ENDOMETRIAL ABLATION;  Surgeon: Everett Graff, MD;  Location: [facility];  Service: Gynecology;  Laterality: N/A;   ESOPHAGOGASTRODUODENOSCOPY N/A 07/25/2021   Procedure: ESOPHAGOGASTRODUODENOSCOPY (EGD);  Surgeon: Felicie Morn, MD;  Location: [facility];  Service: General;  Laterality: N/A;   LAMINECTOMY N/A 03/17/2021   Procedure: Laminectomy - Lumbar Four-Lumbar Five for Intradural mass;  Surgeon: Kary Kos, MD;  Location: [facility];  Service: Neurosurgery;  Laterality: N/A;  3C   LAPAROSCOPIC GASTRIC SLEEVE RESECTION  09/26/2021   MYOMECTOMY N/A 02/03/2014   Procedure: MYOMECTOMY ;  Surgeon: Delice Lesch, MD;  Location: [facility];  Service: Gynecology;  Laterality: N/A;   SALPINGECTOMY  2016   TONSILLECTOMY     age 26   TUBAL LIGATION N/A 08/19/2015   Procedure: POST PARTUM TUBAL LIGATION;  Surgeon: Everett Graff, MD;  Location: [facility];  Service: Gynecology;  Laterality: N/A;   UPPER GI ENDOSCOPY N/A 09/26/2021   Procedure: UPPER GI ENDOSCOPY;  Surgeon: Felicie Morn, MD;  Location: [facility];  Service: General;  Laterality: N/A;   WISDOM TOOTH EXTRACTION     age 39    Social History   Socioeconomic History   Marital status: Legally Separated    Spouse name: Not on file   Number of children: Not on file   Years of  education: Not on file   Highest education level: Not on file  Occupational History   Not on file  Tobacco Use   Smoking status: Never   Smokeless tobacco: Never  Vaping Use   Vaping Use: Never used  Substance and Sexual Activity   Alcohol use: Never   Drug use: No   Sexual activity: Yes    Birth control/protection: None  Other Topics Concern   Not on file  Social History Narrative   Not on file   Social Determinants of Health   Financial Resource Strain: Not on file  Food Insecurity: Not on file  Transportation Needs: Not on file  Physical  Activity: Not on file  Stress: Not on file  Social Connections: Not on file  Intimate Partner Violence: Not on file    Current Outpatient Medications on File Prior to Visit  Medication Sig Dispense Refill   albuterol (VENTOLIN HFA) 108 (90 Base) MCG/ACT inhaler Inhale 2 puffs into the lungs every 6 (six) hours as needed for wheezing or shortness of breath. 1 each 3   amoxicillin-clavulanate (AUGMENTIN) 875-125 MG tablet Take 1 tablet by mouth 2 (two) times daily.     baclofen (LIORESAL) 10 MG tablet Take 0.5-1 tablets (5-10 mg total) by mouth 3 (three) times daily as needed for muscle spasms. 90 each 5   Calcium Citrate-Vitamin D (CELEBRATE CALCIUM CITRATE) 500-12.5 MG-MCG CHEW Chew by mouth in the morning, at noon, and at bedtime.     DULoxetine (CYMBALTA) 60 MG capsule Take 60 mg by mouth at bedtime.     ibuprofen (ADVIL) 600 MG tablet Take 1 tablet (600 mg total) by mouth every 8 (eight) hours as needed. 30 tablet 1   Multiple Vitamins-Minerals (CELEBRATE MULTI-COMPLETE 18) CHEW Chew by mouth daily.     naproxen sodium (ALEVE) 220 MG tablet Take 220 mg by mouth once.     norethindrone (AYGESTIN) 5 MG tablet norethindrone acetate 5 mg tablet     ondansetron (ZOFRAN-ODT) 4 MG disintegrating tablet Dissolve 1 tablet in mouth every 6 hours as needed for nausea or vomiting. 20 tablet 0   oxyCODONE-acetaminophen (PERCOCET) 5-325 MG tablet Take 1 tablet by mouth every 6 (six) hours as needed for severe pain. Do not take with Ambien/Zolpidem 12 tablet 0   pantoprazole (PROTONIX) 40 MG tablet Take 1 tablet  by mouth daily. (Patient taking differently: Take 40 mg by mouth as needed.) 90 tablet 0   propranolol (INDERAL) 20 MG tablet Take 1 tablet by mouth twice daily 60 tablet 3   Relugolix-Estradiol-Norethind (MYFEMBREE) 40-1-0.5 MG TABS Myfembree 40 mg-1 mg-0.5 mg tablet     zolpidem (AMBIEN) 10 MG tablet Take 10 mg by mouth at bedtime as needed.     No current facility-administered medications on  file prior to visit.    Allergies  Allergen Reactions   Ceftriaxone Hives and Rash    No SOB - patient willing to try again   Diclofenac Rash   Fluconazole Hives    Raw rash on abdomen and she had sores and swelling in mouth     Pineapple Shortness Of Breath   Latex Rash   Bactrim [Sulfamethoxazole-Trimethoprim] Hives    Family History  Problem Relation Age of Onset   Hypertension Mother    Diabetes Mother    Mental illness Mother    Hyperlipidemia Mother    Stroke Mother    Depression Mother    Anxiety disorder Mother    Bipolar disorder Mother  Liver disease Mother    Sleep apnea Mother    Obesity Mother    Hypertension Father    Asthma Brother    Seizures Brother    Pancreatic cancer Maternal Aunt 6   Lung cancer Maternal Aunt        dx after 38; smoking hx   Liver cancer Maternal Uncle        dx after 42   Breast cancer Paternal Aunt        two paternal aunts, age 32s-40s   Ovarian cancer Paternal Aunt        two paternal aunts, reported BRCA mutation   Colon polyps Paternal Uncle        two paternal uncles; more than 65 lifetime polyps   Heart disease Maternal Grandmother    Hypertension Maternal Grandmother    Breast cancer Maternal Grandmother        dx 30s-40s; d. 33   Ovarian cancer Paternal Grandmother    Ovarian cancer Other        PGM's mother   Breast cancer Cousin        dx 72s   Ovarian cancer Cousin        dx 47s    BP 118/90 (BP Location: Left Arm, Patient Position: Sitting, Cuff Size: Normal)    Pulse 93    Ht 5' 4.5" (1.638 m)    Wt 225 lb 12.8 oz (102.4 kg)    SpO2 98%    BMI 38.16 kg/m    Review of Systems denies n/v, syncope, diarrhea, sob, and fever.     Objective:   Physical Exam VS: see vs page GEN: no distress HEAD: head: no deformity eyes: no periorbital swelling, no proptosis external nose and ears are normal NECK: supple, thyroid is not enlarged CHEST WALL: no deformity LUNGS: clear to auscultation CV: reg rate  and rhythm, no murmur.  MUSCULOSKELETAL: gait is normal and steady EXTEMITIES: no deformity.  no leg edema NEURO:  readily moves all 4's.  sensation is intact to touch on all 4's SKIN:  Normal texture and temperature.  No rash or suspicious lesion is visible.   NODES:  None palpable at the neck PSYCH: alert, well-oriented.  Does not appear anxious nor depressed.  Lab Results  Component Value Date   TSH 1.19 06/07/2021   T3TOTAL 116 12/23/2019    Pathol: Paraganglioma Pet CT: normal adrenals CT (2023): no mention is made of the adrenals.   MRI (2023) no residual tumor.    I have reviewed outside records, and summarized: Pt was noted to have above sxs, and referred here.  She was seen in ER for sciatica sxs, and paraspinal nodule was noted.      Assessment & Plan:  Excessive sweating, in pt with h/o paraganglionoma.  She should have labs.    Patient Instructions  Blood tests, and a 24HR urine collection, are requested for you today.  We'll let you know about the results.

## 2021-12-18 NOTE — Patient Instructions (Signed)
Blood tests, and a 24HR urine collection, are requested for you today.  We'll let you know about the results.

## 2021-12-19 LAB — CORTISOL: Cortisol, Plasma: 6.3 ug/dL

## 2021-12-19 LAB — T4, FREE: Free T4: 0.88 ng/dL (ref 0.60–1.60)

## 2021-12-19 LAB — TSH: TSH: 1.55 u[IU]/mL (ref 0.35–5.50)

## 2022-01-05 LAB — CATECHOLAMINES, FRACTIONATED, PLASMA
Dopamine: <20 pg/mL
Epinephrine: 91 pg/mL
Norepinephrine: 278 pg/mL
Total Catecholamines: 369 pg/mL

## 2022-01-05 LAB — PTH, INTACT AND CALCIUM
Calcium: 9.7 mg/dL (ref 8.6–10.2)
PTH: 26 pg/mL (ref 16–77)

## 2022-01-05 LAB — METANEPHRINES, PLASMA
Metanephrine, Free: 38 pg/mL (ref <=57)
Normetanephrine, Free: 71 pg/mL (ref <=148)
Total Metanephrines-Plasma: 109 pg/mL (ref <=205)

## 2022-01-05 LAB — PROLACTIN: Prolactin: 9 ng/mL

## 2022-01-05 LAB — CALCITONIN: Calcitonin: <2 pg/mL (ref <=5)

## 2022-01-05 LAB — ACTH: C206 ACTH: 14 pg/mL (ref 6–50)

## 2022-01-15 NOTE — Progress Notes (Signed)
?Cardiology Office Note:   ? ?Date:  01/18/2022  ? ?ID:  Shannon [name], DOB [birth year], MRN [id number] ? ?PCP:  Pcp, No  ?Cardiologist:  Buford Dresser, MD ? ?Referring MD: Glendale Chard, MD  ? ?CC: follow up ? ?History of Present Illness:   ? ?Shannon [name] is a 41 y.o. female with a hx of ADD, anemia, anxiety, asthma, depression, hyperlipidemia, hypertension, obesity, and paraganglioma, who is seen for follow-up. I initially met her 08/01/2021 as a new consult at the request of Glendale Chard, MD for the evaluation and management of shortness of breath and tachycardia. ? 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 ?Today: ?Since her last visit she is now status post gastric sleeve resection 09/26/2021. She has lost at least 30 lbs since then. ? ?Overall, she is feeling good. However, she continues to feel racing heart beats. Since beginning propranolol, there has been significant improvement in the frequency and severity of her racing palpitations and diaphoresis. She has noticed that missing even 1 dose of her propranolol causes her symptoms to worsen.  ? ?She denies any chest pain, shortness of breath, or peripheral edema. No lightheadedness, headaches, syncope, orthopnea, or PND. ? ? ?Past Medical History:  ?Diagnosis Date  ? Acid reflux 2022  ? ADD (attention deficit disorder)   ? Anemia 08/23/2021  ? on 09/27/21 HGB was 10.8 (Epic)  ? Anxiety   ? Asthma   ? Back pain   ? Depression   ? Family history of breast cancer 04/28/2021  ? Family history of ovarian cancer 04/28/2021  ? Family history of pancreatic cancer 04/28/2021  ? Hyperlipidemia   ?  Hypertension   ? Preeclampsia post delivery  ? Multiple food allergies   ? pineapple and sometimes egg derivatives  ? Obesity   ? 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 Vitamin D deficiency   ? Wears contact lenses   ? Wears glasses   ? ? ?Past Surgical History:  ?Procedure Laterality Date  ? BIOPSY  07/25/2021  ? Procedure: BIOPSY;  Surgeon: Felicie Morn, MD;  Location: [facility];  Service: General;;  ? [place] N/A 10/12/2021  ? Procedure: DILATATION & CURETTAGE/HYSTEROSCOPY;  Surgeon: Everett Graff, MD;  Location[facility];  Service: Gynecology;  Laterality: N/A;  ? DILATATION & CURRETTAGE/HYSTEROSCOPY WITH RESECTOCOPE N/A 02/03/2014  ? Procedure: DILATATION & CURETTAGE/HYSTEROSCOPY ;  Surgeon: Delice Lesch, MD;  Location: [facility];  Service: Gynecology;  Laterality: N/A;  ? [place] OF UTERUS  2001  ? ENDOMETRIAL ABLATION N/A 10/12/2021  ? Procedure: ENDOMETRIAL ABLATION;  Surgeon: Everett Graff, MD;  Location: [facility];  Service: Gynecology;  Laterality: N/A;  ? ESOPHAGOGASTRODUODENOSCOPY N/A 07/25/2021  ? Procedure: ESOPHAGOGASTRODUODENOSCOPY (EGD);  Surgeon: Felicie Morn, MD;  Location: [facility];  Service: General;  Laterality: N/A;  ? LAMINECTOMY N/A 03/17/2021  ? Procedure: Laminectomy - Lumbar Four-Lumbar Five for Intradural mass;  Surgeon: Kary Kos, MD;  Location: [facility];  Service: Neurosurgery;  Laterality: N/A;  3C  ? LAPAROSCOPIC GASTRIC SLEEVE RESECTION  09/26/2021  ? MYOMECTOMY N/A 02/03/2014  ? Procedure: MYOMECTOMY ;  Surgeon: Delice Lesch, MD;  Location: [facility];  Service: Gynecology;  Laterality: N/A;  ? SALPINGECTOMY  2016  ? TONSILLECTOMY    ? age 89  ? TUBAL LIGATION N/A 08/19/2015  ? Procedure: POST PARTUM TUBAL LIGATION;  Surgeon: Everett Graff, MD;  Location: [facility];  Service: Gynecology;  Laterality: N/A;  ? UPPER GI ENDOSCOPY N/A 09/26/2021  ? Procedure: UPPER GI  ENDOSCOPY;  Surgeon: Felicie Morn, MD;  Location: [facility];  Service: General;  Laterality: N/A;  ? WISDOM TOOTH EXTRACTION    ? age 73  ? ? ?Current Medications: ?Current Outpatient Medications on File Prior to Visit  ?Medication Sig  ? albuterol (VENTOLIN HFA) 108 (90 Base) MCG/ACT inhaler Inhale 2 puffs into the lungs every 6 (six) hours as needed for wheezing or shortness of breath.  ? Calcium Citrate-Vitamin D (CELEBRATE CALCIUM CITRATE) 500-12.5 MG-MCG CHEW Chew by mouth in the morning, at noon, and at bedtime.  ? DULoxetine (CYMBALTA) 60 MG capsule Take 60 mg by mouth at bedtime.  ? ibuprofen (ADVIL) 600 MG tablet Take 1 tablet (600 mg total) by mouth every 8 (eight) hours as needed.  ? Multiple Vitamins-Minerals (CELEBRATE MULTI-COMPLETE 18) CHEW Chew by mouth daily.  ? propranolol (INDERAL) 20 MG tablet Take 1 tablet by mouth twice daily  ? Relugolix-Estradiol-Norethind (MYFEMBREE) 40-1-0.5 MG TABS Myfembree 40 mg-1 mg-0.5 mg tablet  ? zolpidem (AMBIEN) 10 MG tablet Take 10 mg by mouth at bedtime as needed.  ? ?No current facility-administered medications on file prior to visit.  ?  ? ?Allergies:   Ceftriaxone, Diclofenac, Fluconazole, Pineapple, Latex, and Bactrim [sulfamethoxazole-trimethoprim]  ? ?Social History  ? ?Tobacco Use  ? Smoking status: Never  ? Smokeless tobacco: Never  ?Vaping Use  ? Vaping Use: Never used  ?Substance Use Topics  ? Alcohol use: Never  ? Drug use: No  ? ? ?Family History: ?family history includes Anxiety disorder in her mother; Asthma in her brother; Bipolar disorder in her mother; Breast cancer in her cousin, maternal grandmother, and paternal aunt; Colon polyps in her paternal uncle; Depression in her mother; Diabetes in her mother; Heart disease in her maternal grandmother; Hyperlipidemia in her mother; Hypertension in her father, maternal grandmother, and mother; Liver cancer in her maternal uncle; Liver disease in her mother; Lung cancer in her maternal aunt; Mental  illness in her mother; Obesity in her mother; Ovarian cancer in her cousin, paternal aunt, paternal grandmother, and another family member; Pancreatic cancer (age of onset: 66) in her maternal aunt; Seizures in her brother; Sleep apnea in her mother; Stroke in her mother. ? ?ROS:   ?Please see the history of present illness. ?(+) Racing heart rates ?(+) Palpitations ?(+) Diaphoresis ?All other systems are reviewed and negative.  ? ? ?EKGs/Labs/Other Studies Reviewed:   ? ?The following studies were reviewed today: ? ?Monitor 07/2021: ?~6 days of data recorded on Zio monitor. Patient had a min HR of 51 bpm, max HR of 250 bpm, and avg HR of 92 bpm. Predominant underlying rhythm was Sinus Rhythm. 1 run of Ventricular Tachycardia occurred lasting 4 beats with a max rate of 250 bpm (avg 221 bpm). No SVT, atrial fibrillation, high degree block, or pauses noted. Isolated atrial and ventricular ectopy was rare (<1%). There were 3 triggered events, which were sinus rhythm. No significant arrhythmias detected. ? ?Echo 05/29/2021: ? 1. Left ventricular ejection fraction, by estimation, is 60 to 65%. The  ?left ventricle has normal function. The left ventricle has no regional  ?wall motion abnormalities. Left ventricular diastolic parameters were  ?normal.  ? 2. Right ventricular  systolic function is normal. The right ventricular  ?size is normal.  ? 3. The mitral valve is normal in structure. No evidence of mitral valve  ?regurgitation. No evidence of mitral stenosis.  ? 4. The aortic valve is normal in structure. Aortic valve regurgitation is  ?not visualized. No aortic stenosis is present.  ? 5. The inferior vena cava is normal in size with greater than 50%  ?respiratory variability, suggesting right atrial pressure of 3 mmHg. ? ?EKG:  EKG is personally reviewed.   ?01/17/2022: EKG was not ordered. ?08/30/2021: not ordered ?08/01/21: sinus tachycardia at 109 bpm ? ?Recent Labs: ?04/18/2021: BNP <2.5; Magnesium 2.1 ?08/23/2021: ALT  15 ?11/14/2021: BUN 17; Creatinine, Ser 0.84; Hemoglobin 11.0; Platelets 200; Potassium 4.4; Sodium 140 ?12/18/2021: TSH 1.55  ? ?Recent Lipid Panel ?   ?Component Value Date/Time  ? CHOL 215 (A) 08/1

## 2022-01-17 ENCOUNTER — Ambulatory Visit (INDEPENDENT_AMBULATORY_CARE_PROVIDER_SITE_OTHER): Payer: BC Managed Care – PPO | Admitting: Cardiology

## 2022-01-17 VITALS — BP 125/81 | HR 84 | Ht 64.5 in | Wt 223.4 lb

## 2022-01-17 DIAGNOSIS — R002 Palpitations: Secondary | ICD-10-CM

## 2022-01-17 DIAGNOSIS — Z7189 Other specified counseling: Secondary | ICD-10-CM | POA: Diagnosis not present

## 2022-01-17 DIAGNOSIS — Z9884 Bariatric surgery status: Secondary | ICD-10-CM | POA: Diagnosis not present

## 2022-01-17 NOTE — Patient Instructions (Signed)

## 2022-01-18 ENCOUNTER — Encounter (HOSPITAL_BASED_OUTPATIENT_CLINIC_OR_DEPARTMENT_OTHER): Payer: Self-pay | Admitting: Cardiology

## 2022-01-18 DIAGNOSIS — R002 Palpitations: Secondary | ICD-10-CM | POA: Insufficient documentation

## 2022-01-18 DIAGNOSIS — Z9884 Bariatric surgery status: Secondary | ICD-10-CM | POA: Insufficient documentation

## 2022-02-14 ENCOUNTER — Encounter: Payer: BC Managed Care – PPO | Attending: Surgery | Admitting: Skilled Nursing Facility1

## 2022-02-14 DIAGNOSIS — I10 Essential (primary) hypertension: Secondary | ICD-10-CM | POA: Diagnosis not present

## 2022-02-14 DIAGNOSIS — E669 Obesity, unspecified: Secondary | ICD-10-CM

## 2022-02-14 DIAGNOSIS — Z6841 Body Mass Index (BMI) 40.0 and over, adult: Secondary | ICD-10-CM | POA: Insufficient documentation

## 2022-02-14 DIAGNOSIS — E785 Hyperlipidemia, unspecified: Secondary | ICD-10-CM | POA: Diagnosis not present

## 2022-02-14 DIAGNOSIS — Z713 Dietary counseling and surveillance: Secondary | ICD-10-CM | POA: Insufficient documentation

## 2022-02-14 NOTE — Progress Notes (Signed)
Bariatric Nutrition Follow-Up Visit ?Medical Nutrition Therapy  ? ? ?NUTRITION ASSESSMENT ?  ?Surgery date: 09/26/2021 ?Surgery type: sleeve ?Start weight at [facility]: 253 ?Weight today: 218.7 pounds ?  ?Body Composition Scale 10/11/2021 11/23/2021 02/14/2022  ?Current Body Weight 239.3 233.1 218.7  ?Total Body Fat % 43.8 42.9 41.2  ?Visceral Fat 14 13   ?Fat-Free Mass % 56.1 57   ? Total Body Water % 42.5 43 43.8  ?Muscle-Mass lbs 32.1 32.4   ?BMI 41 39.2   ?Body Fat Displacement     ?       Torso  lbs 65.1 61.9   ?       Left Leg  lbs 13 12.3   ?       Right Leg  lbs 13 12.3   ?       Left Arm  lbs 6.5 6.1   ?       Right Arm   lbs 6.5 6.1   ? 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 No ?  ?Lifestyle & Dietary Hx ? ?Pt states with the weather change she is being more active and feeling mentally well and better.  ?Pt states she thinks she is having an allergic reaction to the multivitamins making her lips itch.  ?Pt states she no longer has issus with her fibroids with no flow during her menstrual cycle due to the ablation. Pt states she has more energy now.  ?Pt state she loves being active now and misses it if she does not.  ? ?Estimated daily fluid intake: 64 oz ?Estimated daily protein intake: 80+ g ?Supplements: multi and calcium  ?Current average weekly physical activity: Zumba once a week and step once a week and walking 3 days a week 45-60 min ? ?24-Hr Dietary Recall ?First Meal: ensure protein shake ?Snack:  almonds and mango ?Second Meal: skipped ?Snack:  ?Third Meal: chicken or tuna on salad + sometimes crackers ?Snack: sherbet ice cream or sugar free ice cream + nuts ?Beverages: water, propel, gatorade g2 ? ?Post-Op Goals/ Signs/ Symptoms ?Using straws: no ?Drinking while eating: no ?Chewing/swallowing difficulties:  no ?Changes in vision: no ?Changes to mood/headaches: no ?Hair loss/changes to skin/nails: no ?Difficulty focusing/concentrating: no ?Sweating: no ?Limb weakness: no ?Dizziness/lightheadedness: no ?Palpitations: no  ?Carbonated/caffeinated beverages: no ?N/V/D/C/Gas: no ?Abdominal pain: no ?Dumping syndrome: no ? ?  ?NUTRITION DIAGNOSIS  ?Overweight/obesity ([place]-3.3) related to past poor dietary habits and physical inactivity as evidenced by completed bariatric surgery and following dietary guidelines for continued weight loss and healthy nutrition status. ?  ?  ?NUTRITION INTERVENTION ?Nutrition counseling (C-1) and education (E-2) to facilitate bariatric surgery goals, including: ?The importance of consuming adequate calories as well as certain nutrients daily due to the body's need for essential vitamins, minerals, and fats ?The importance of daily physical activity and to reach a goal of at least 150 minutes of moderate to vigorous physical activity weekly (or as directed by their physician) due to benefits such as increased musculature and improved lab values ?The importance of intuitive eating specifically learning hunger-satiety cues and understanding the importance of learning a new body: The importance of mindful eating to avoid grazing behaviors  ?Encouraged patient to honor their body's internal hunger and fullness cues.  Throughout the day, check in mentally and rate hunger. Stop eating when satisfied not full regardless of how  much food is left on the plate.  Get more if still hungry 20-30 minutes later.  The key is to honor satisfaction so throughout the meal, rate fullness factor and stop when comfortably satisfied not physically full. The key is to honor hunger and fullness without any feelings of guilt or shame.  Pay attention to what the internal cues are, rather than any external factors. This will enhance the confidence you have in listening to your own body and following those internal cues  enabling you to increase how often you eat when you are hungry not out of appetite and stop when you are satisfied not full.  ?Encouraged pt to continue to eat balanced meals inclusive of non starchy vegetables 2 times a day 7 days a week ?Encouraged pt to choose lean protein sources: limiting beef, pork, sausage, hotdogs, and lunch meat ?Encourage pt to choose healthy fats such as plant based limiting animal fats ?Encouraged pt to continue to drink a minium 64 fluid ounces with half being plain water to satisfy proper hydration  ? ? ?Handouts Provided Include  ?Phase 7 ? ?Goals: ?-add in resistance activity 2 times a week  ?-try the capsule multivitamin and see if you have a reaction  ?-tweak your schedule with eating to allow fluid so reducing the almond and mango snacking  ?-avoid almonds for 2 days to see if metallic taste goes away ? ?Learning Style & Readiness for Change ?Teaching method utilized: Visual & Auditory  ?Demonstrated degree of understanding via: Teach Back  ?Readiness Level: action ?Barriers to learning/adherence to lifestyle change: none identified  ? ?RD's Notes for Next Visit ?Assess adherence to pt chosen goals  ? ? ?MONITORING & EVALUATION ?Dietary intake, weekly physical activity, body weight ? ?Next Steps ?Patient is to follow-up in late October ?

## 2022-02-28 ENCOUNTER — Ambulatory Visit: Payer: BC Managed Care – PPO | Admitting: Internal Medicine

## 2022-03-07 ENCOUNTER — Telehealth: Payer: Self-pay

## 2022-03-07 ENCOUNTER — Other Ambulatory Visit: Payer: Self-pay

## 2022-03-07 NOTE — Telephone Encounter (Signed)
Pt called and requested a return back to work letter, she states her and Dr.Vaslow discussed how she can return when she is ready. Letter has been made and faxed to # [phone] as provided by pt. No further questions or concerns. ?

## 2022-03-20 ENCOUNTER — Telehealth: Payer: Self-pay | Admitting: Skilled Nursing Facility1

## 2022-03-20 NOTE — Telephone Encounter (Signed)
Pt called stating she is allergic to her multivitamin and wanted other suggestions.  Dietitian advised without knowing what she is allergic to that would be difficult without harming.    Allergic to eggs and pineapple/orange juice/acidic fruits/yellow dragon fruit/fruit in a shell like a pineapple   Dietitian will look further into this and see if she can find an alternative and get back to the pt.

## 2022-03-20 NOTE — Telephone Encounter (Signed)
Returned pt's call. LVM.

## 2022-04-03 ENCOUNTER — Telehealth: Payer: Self-pay

## 2022-04-03 NOTE — Telephone Encounter (Signed)
Notified Patient of completion of FMLA paperwork. Copy of Paperwork placed for pick-up as requested by Patient. No other needs or concerns voiced at this time.

## 2022-05-30 ENCOUNTER — Encounter (INDEPENDENT_AMBULATORY_CARE_PROVIDER_SITE_OTHER): Payer: Self-pay

## 2022-07-20 ENCOUNTER — Encounter (HOSPITAL_COMMUNITY): Payer: Self-pay | Admitting: *Deleted

## 2022-08-14 ENCOUNTER — Ambulatory Visit: Payer: BC Managed Care – PPO | Admitting: Skilled Nursing Facility1

## 2022-08-27 IMAGING — CT CT ABD-PELV W/ CM
2 of 5 series · 16 of 46 positions shown, 18 images · IV contrast (Omni 300)
Comparison: CT abdomen pelvis dated 12/23/2013.

CLINICAL DATA: Pelvic pain.

EXAM:
CT ABDOMEN AND PELVIS WITH CONTRAST
TECHNIQUE: Multidetector CT imaging of the abdomen and pelvis was performed
using the standard protocol following bolus administration of
intravenous contrast.

[Series 3: a/p w/ 5mm · axial · 0.80mm/px · z∈[+685,+1105]mm · 13 of 94 slices shown, 15 images]
[im 5/94  soft-tissue]
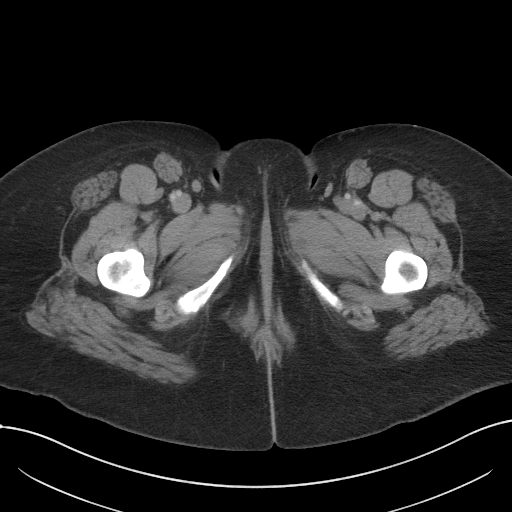
[im 5/94  bone]
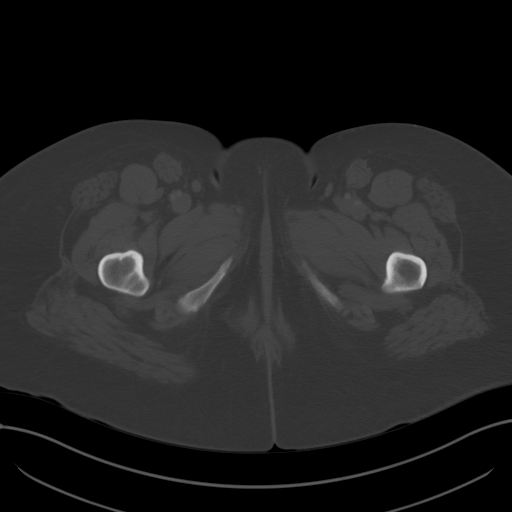
[im 14/94  soft-tissue]
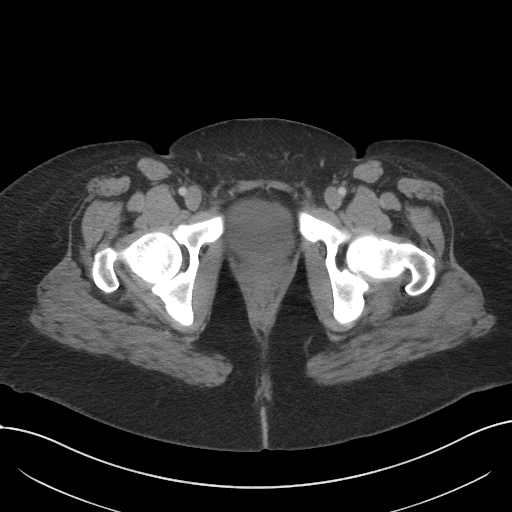
[im 18/94  soft-tissue]
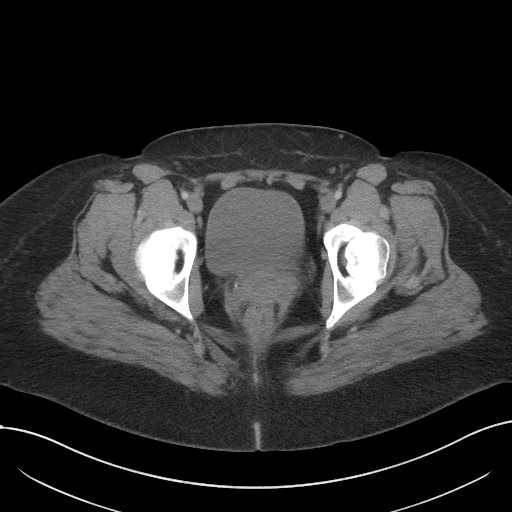
[im 27/94  soft-tissue]
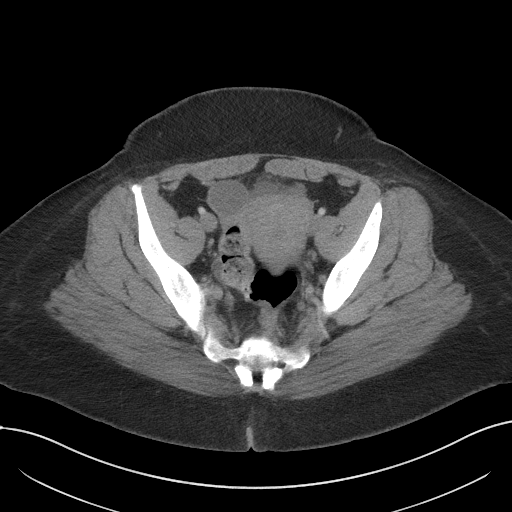
[im 32/94  soft-tissue]
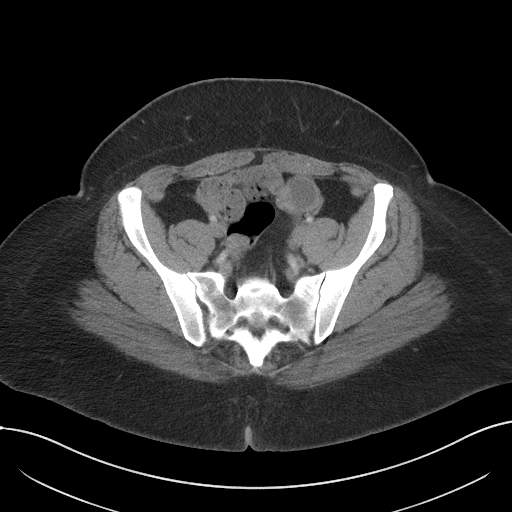
[im 40/94  soft-tissue]
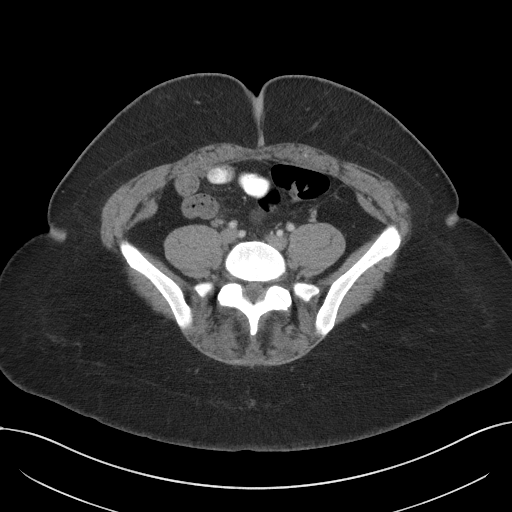
[im 49/94  soft-tissue]
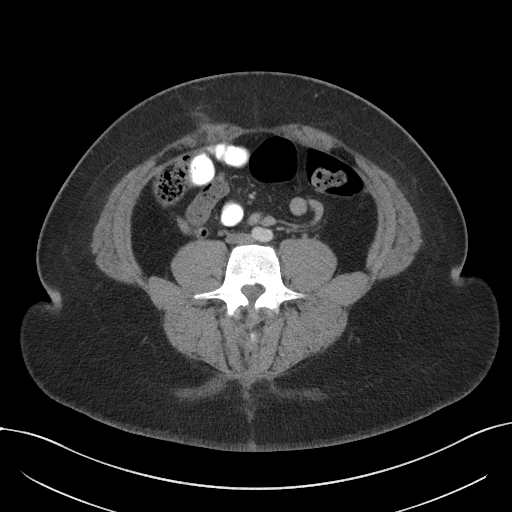
[im 54/94  soft-tissue]
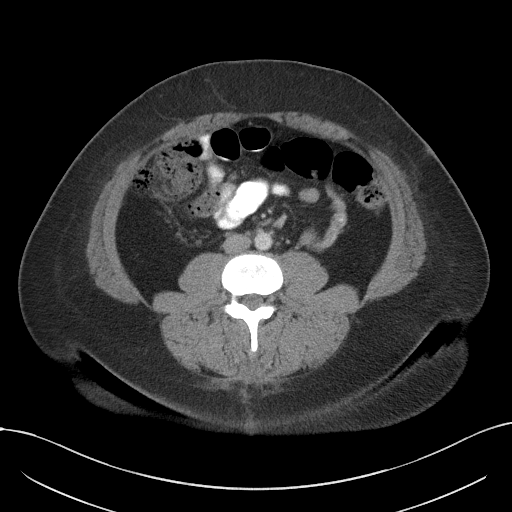
[im 63/94  soft-tissue]
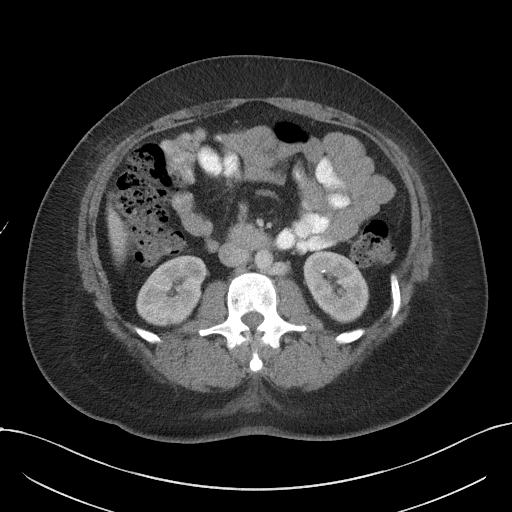
[im 63/94  bone]
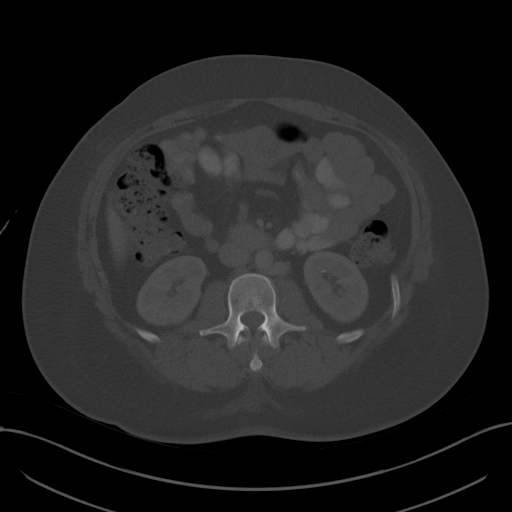
[im 67/94  soft-tissue]
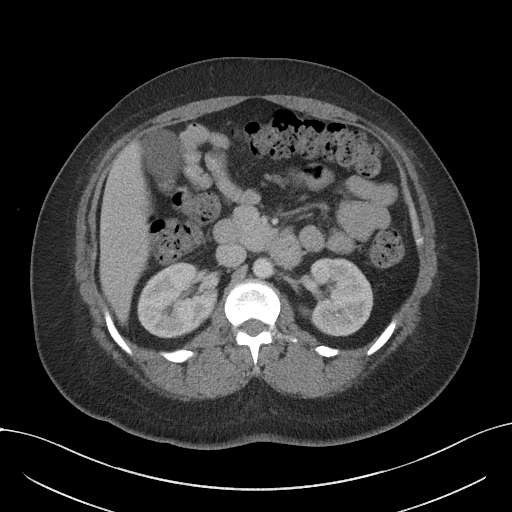
[im 76/94  soft-tissue]
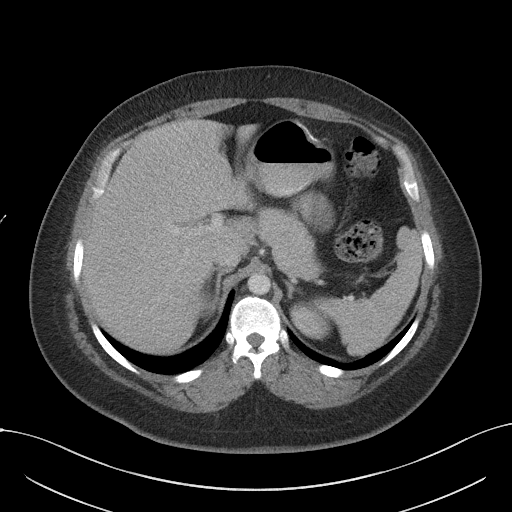
[im 80/94  soft-tissue]
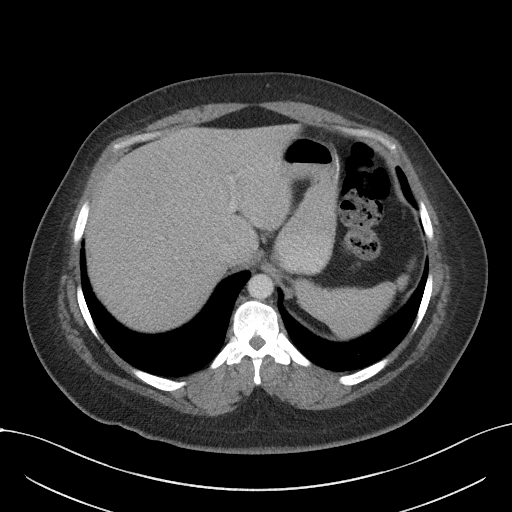
[im 89/94  soft-tissue]
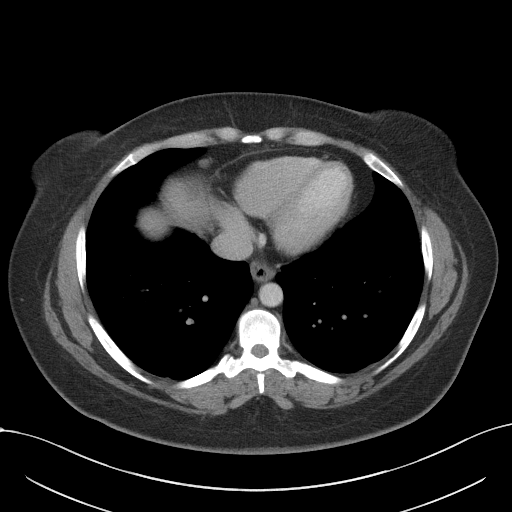

[Series 7: a/p w/ cor · coronal · 0.91mm/px · 3 of 166 slices shown]
[im 56/166  soft-tissue]
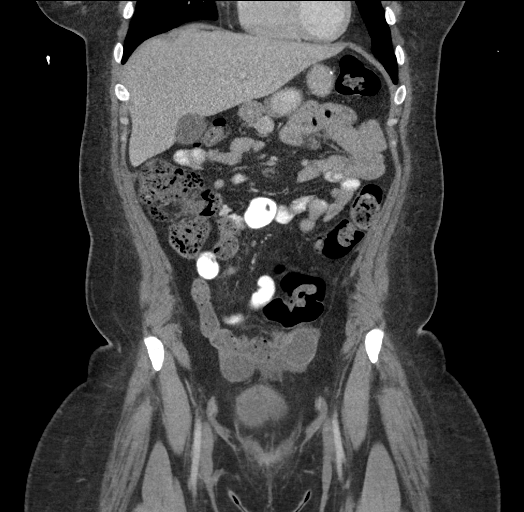
[im 74/166  soft-tissue]
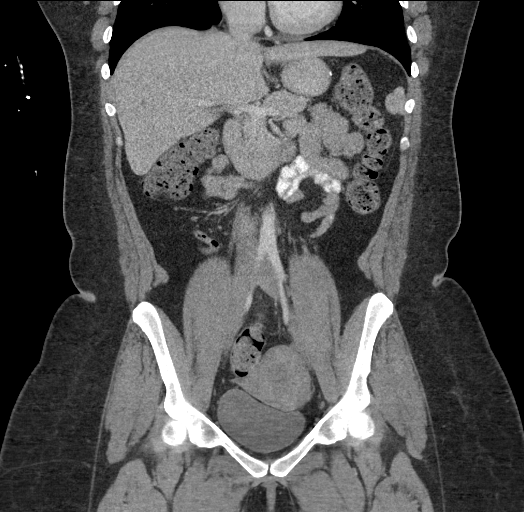
[im 92/166  soft-tissue]
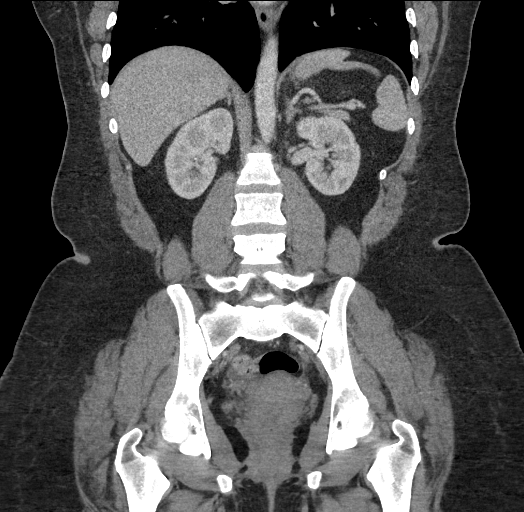

[16 of 46 positions shown; findings below may reference images not displayed]

RADIATION DOSE REDUCTION: This exam was performed according to the
departmental dose-optimization program which includes automated
exposure control, adjustment of the mA and/or kV according to
patient size and/or use of iterative reconstruction technique.

CONTRAST:  100mL OMNIPAQUE IOHEXOL 300 MG/ML  SOLN
FINDINGS: Lower chest: The visualized lung bases are clear.

No intra-abdominal free air.  Small free fluid within the pelvis.

Hepatobiliary: No focal liver abnormality is seen. No gallstones,
gallbladder wall thickening, or biliary dilatation.

Pancreas: Unremarkable. No pancreatic ductal dilatation or
surrounding inflammatory changes.

Spleen: Normal in size without focal abnormality.

Adrenals/Urinary Tract: Several small nonobstructing bilateral renal
calculi measure up to 3 mm in the inferior pole of the left kidney.
There is no hydronephrosis on either side. There is symmetric
enhancement and excretion of contrast by both kidneys. The
visualized ureters and urinary bladder appear unremarkable.

Stomach/Bowel: Postsurgical changes of gastric sleeve. There is
moderate stool throughout the colon. There is no bowel obstruction
or active inflammation. The appendix is normal.

Vascular/Lymphatic: The abdominal aorta and IVC are unremarkable. No
portal venous gas. There is no adenopathy.

Reproductive: The uterus is anteverted. Small uterine fibroids.
Bilateral ovarian cysts or follicles measure up to 3.5 cm on the
left. No follow-up imaging recommended. Note: This recommendation
does not apply to premenarchal patients and to those with increased
risk (genetic, family history, elevated tumor markers or other
high-risk factors) of ovarian cancer. Reference: JACR [DATE]):[id number]

Other: None

Musculoskeletal: Lower lumbar laminectomy. No acute osseous
pathology.
IMPRESSION: 1. No acute intra-abdominal or pelvic pathology.
2. Small nonobstructing bilateral renal calculi. No hydronephrosis.
3. Bilateral ovarian cysts or follicles measure up to 3.5 cm on the
left.

## 2022-08-29 ENCOUNTER — Encounter: Payer: BC Managed Care – PPO | Admitting: Internal Medicine

## 2022-09-10 ENCOUNTER — Encounter: Payer: Self-pay | Admitting: Dietician

## 2022-09-10 ENCOUNTER — Encounter: Payer: BC Managed Care – PPO | Attending: Surgery | Admitting: Dietician

## 2022-09-10 ENCOUNTER — Other Ambulatory Visit: Payer: Self-pay

## 2022-09-10 VITALS — Ht 64.5 in | Wt 222.3 lb

## 2022-09-10 DIAGNOSIS — E669 Obesity, unspecified: Secondary | ICD-10-CM | POA: Insufficient documentation

## 2022-09-10 NOTE — Progress Notes (Signed)
Bariatric Nutrition Follow-Up Visit Medical Nutrition Therapy    NUTRITION ASSESSMENT   Surgery date: 09/26/2021 Surgery type: sleeve Start weight at [facility]: 253 Weight today: 222.3 pounds   Body Composition Scale 10/11/2021 11/23/2021 02/14/2022 09/10/2022  Current Body Weight 239.3 233.1 218.7 222.3  Total Body Fat % 43.8 42.9 41.2 41.7  Visceral Fat '14 13  12  '$ Fat-Free Mass % 56.1 57  58.2   Total Body Water % 42.5 43 43.8 43.6  Muscle-Mass lbs 32.1 32.4  32.2  BMI 41 39.2  37.6  Body Fat Displacement             Torso  lbs 65.1 61.9  57.4         Left Leg  lbs 13 12.3  11.4         Right Leg  lbs 13 12.3  11.4         Left Arm  lbs 6.5 6.1  5.7         Right Arm   lbs 6.5 6.1  5.7   Clinical  Medical hx: ADD, anxiety, depression; 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 No   Lifestyle & Dietary Hx  Pt states she was started and taken off vivance (to treat ADHD), stating that has put her on a roller coaster with her food. Pt states she is back to grabbing chips and moving on. Pt states that she has not been on medication, stating she is all over the place. Pt states she has an appointment next week for medication, to get her back on track Pt states she has not taken her multivitamin for about a month, stating since she has been taken off medication for ADD. Pt states she has plenty of calcium supplements, but no multivitamin, stating she can't get down to [place] all the time to get them, and the last time she tried to order them they were delayed. 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 Pt states she is always tired.  Estimated daily fluid intake: 64 oz Estimated daily protein intake: 30+ g Supplements: not taking right now, stating it has been about a month since she has taken them.  Current average weekly physical  activity: ADLs  24-Hr Dietary Recall First Meal: ensure protein shake Snack:  chips Second Meal:  Snack:  Third Meal: chips Snack: nuts Beverages: water, propel, Gatorade zero  Post-Op Goals/ Signs/ Symptoms Using straws: no Drinking while eating: no Chewing/swallowing difficulties: no Changes in vision: no Changes to mood/headaches: no Hair loss/changes to skin/nails: no Difficulty focusing/concentrating: no Sweating: no Limb weakness: no Dizziness/lightheadedness: no Palpitations: no  Carbonated/caffeinated beverages: no N/V/D/C/Gas: no Abdominal pain: no Dumping syndrome: no    NUTRITION DIAGNOSIS  Overweight/obesity ([place]-3.3) related to past poor dietary habits and physical inactivity as evidenced by completed bariatric surgery and following dietary guidelines for continued weight loss and healthy nutrition status.   NUTRITION INTERVENTION Nutrition counseling (C-1) and education (E-2) to facilitate bariatric surgery goals, including: The importance of consuming adequate calories as well as certain nutrients daily due to the body's need for essential vitamins, minerals, and fats The importance of daily physical activity and to reach a goal of at least 150 minutes of moderate to vigorous physical activity weekly (or as directed by their physician) due to benefits such as increased musculature and improved lab values The importance of mindful eating  to avoid grazing behaviors  Encouraged pt to continue to eat balanced meals inclusive of non starchy vegetables 2 times a day 7 days a week Encouraged pt to choose lean protein sources: limiting beef, pork, sausage, hotdogs, and lunch meat Encourage pt to choose healthy fats such as plant based limiting animal fats Encouraged pt to continue to drink a minium 64 fluid ounces with half being plain water to satisfy proper hydration   Why you need complex carbohydrates: Whole grains and other complex carbohydrates are required to  have a healthy diet. Whole grains provide fiber which can help with blood glucose levels and help keep you satiated. Fruits and starchy vegetables provide essential vitamins and minerals required for immune function, eyesight support, brain support, bone density, wound healing and many other functions within the body. According to the current evidenced based 2020-2025 Dietary Guidelines for Americans, complex carbohydrates are part of a healthy eating pattern which is associated with a decreased risk for type 2 diabetes, cancers, and cardiovascular disease.   Encouraged pt to get back to basic bariatric food plan and habits. Reviewed pre-op goals  Pre-Op Goals Reviewed with the Patient Track food and beverage intake (pen and paper, MyFitness Pal, Baritastic app, etc.) Make healthy food choices while monitoring portion sizes Consume 3 meals per day or try to eat every 3-5 hours Avoid concentrated sugars and fried foods Keep sugar & fat in the single digits per serving on food labels Practice CHEWING your food (aim for applesauce consistency) Practice not drinking 15 minutes before, during, and 30 minutes after each meal and snack Avoid all carbonated beverages (ex: soda, sparkling beverages)  Limit caffeinated beverages (ex: coffee, tea, energy drinks) Avoid all sugar-sweetened beverages (ex: regular soda, sports drinks)  Avoid alcohol  Aim for 64-100 ounces of FLUID daily (with at least half of fluid intake being plain water)  Aim for at least 60-80 grams of PROTEIN daily Look for a liquid protein source that contains ?15 g protein and ?5 g carbohydrate (ex: shakes, drinks, shots) Make a list of non-food related activities Physical activity is an important part of a healthy lifestyle so keep it moving! The goal is to reach 150 minutes of exercise per week, including cardiovascular and weight baring activity.  Handouts Provided Include  Bariatric MyPlate Meal Ideas handout  Goals: -get back  to physical activity, walking on breaks at school. -get the bariatric advantage multivitamin and start taking it again. -back to eating throughout the day, using the meal ideas created.  Learning Style & Readiness for Change Teaching method utilized: Visual & Auditory  Demonstrated degree of understanding via: Teach Back  Readiness Level: action Barriers to learning/adherence to lifestyle change: none identified   RD's Notes for Next Visit Assess adherence to pt chosen goals    MONITORING & EVALUATION Dietary intake, weekly physical activity, body weight  Next Steps Patient is to follow-up in February.

## 2022-09-22 IMAGING — MR MR LUMBAR SPINE WO/W CM
5 of 7 series · 31 of 48 positions shown · IV contrast (10 GADAVIST)
Comparison: MRI of the lumbar spine May 26, 2021.

CLINICAL DATA: Paraganglioma (HCC) BUU.D (QSC-TZ-CM). Brain/CNS
neoplasm, monitor.

EXAM:
MRI LUMBAR SPINE WITHOUT AND WITH CONTRAST
TECHNIQUE: Multiplanar and multiecho pulse sequences of the lumbar spine were
obtained without and with intravenous contrast.
CONTRAST:  10mL GADAVIST GADOBUTROL 1 MMOL/ML IV SOLN

[Series 5: T1 · sagittal · 4.0mm · 0.81mm/px · 3 of 15 slices shown (1 of 2)]
[im 1/15]
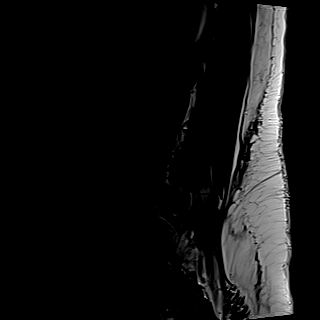
[im 8/15]
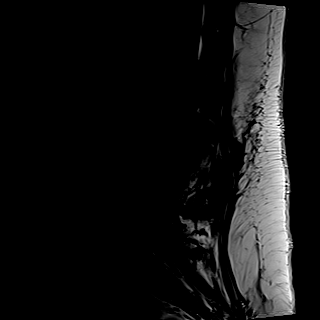
[im 15/15]
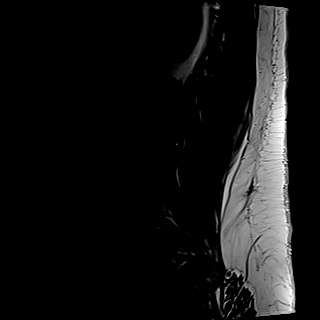

[Series 7: T2 · axial · 4.0mm · 0.62mm/px · z∈[-5,+237]mm · 11 of 46 slices shown]
[im 1/46]
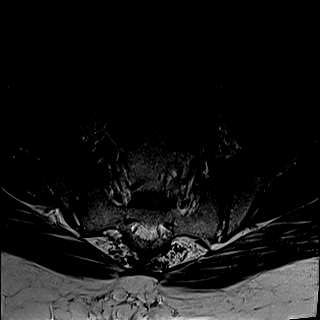
[im 5/46]
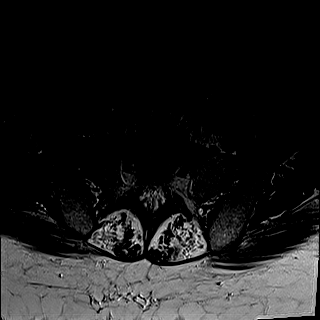
[im 10/46]
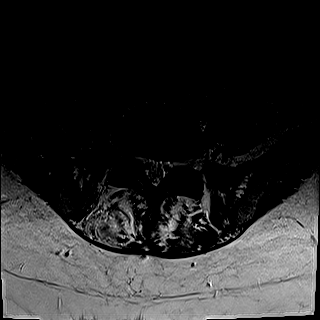
[im 14/46]
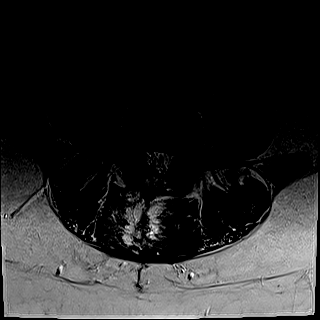
[im 19/46]
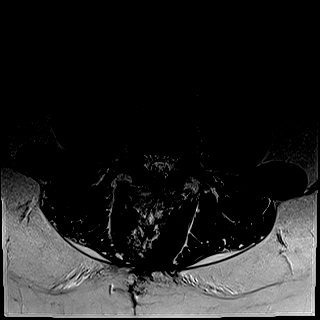
[im 23/46]
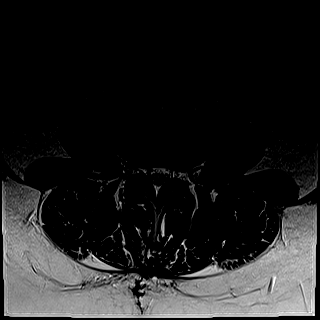
[im 28/46]
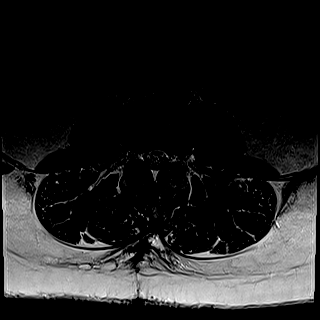
[im 32/46]
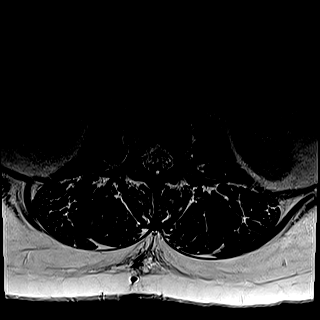
[im 37/46]
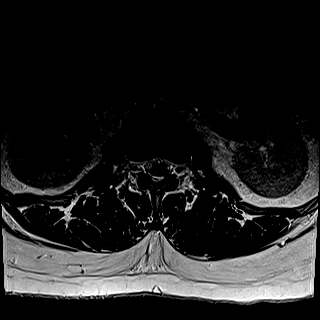
[im 41/46]
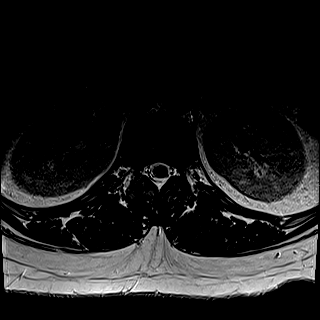
[im 46/46]
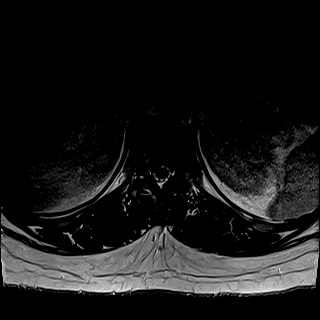

[Series 8: T1 · axial · 4.0mm · 0.39mm/px · z∈[-5,+237]mm · 11 of 46 slices shown (2 of 2)]
[im 1/46]
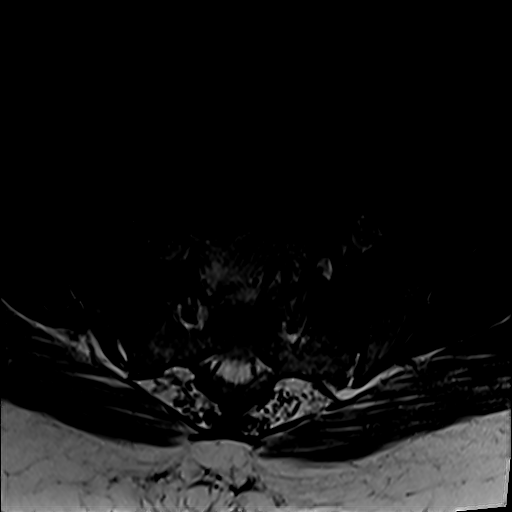
[im 5/46]
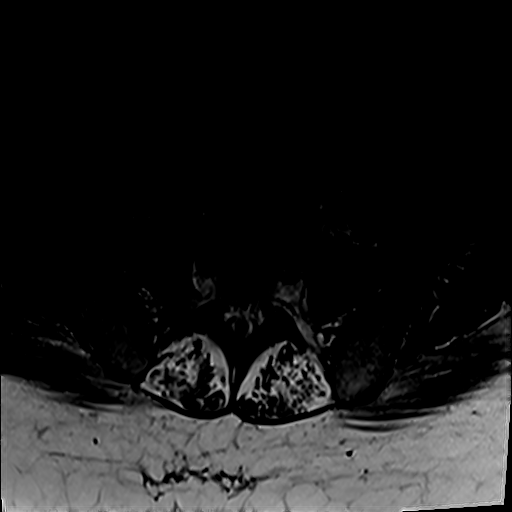
[im 10/46]
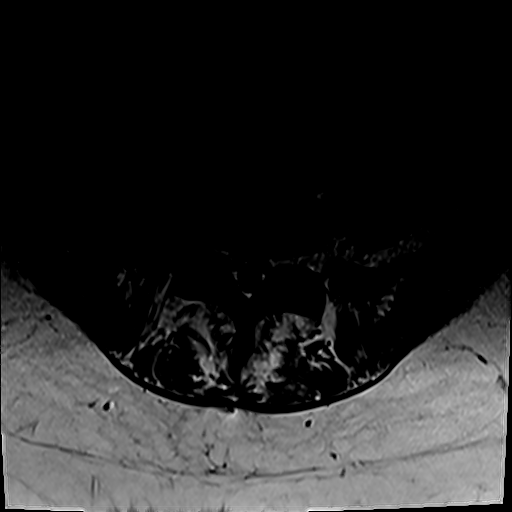
[im 14/46]
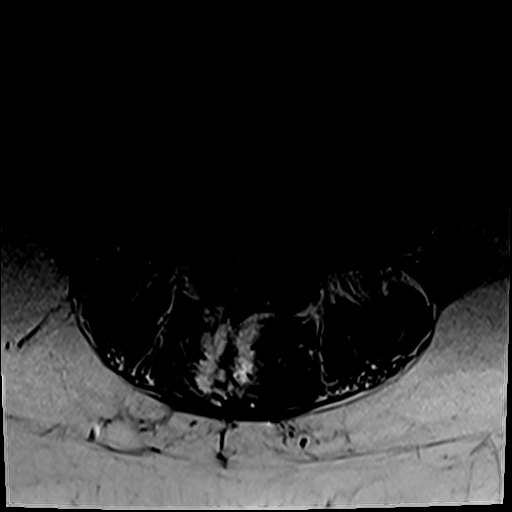
[im 19/46]
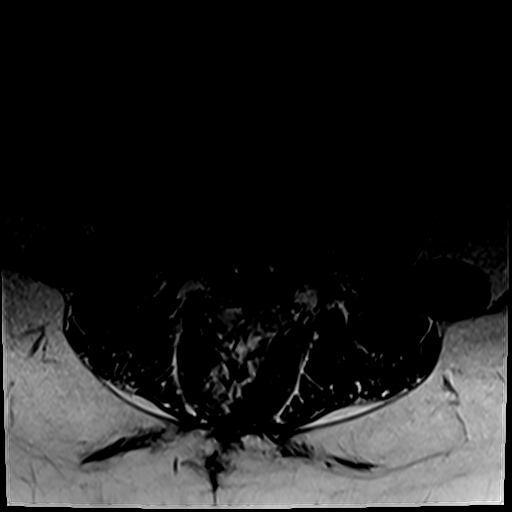
[im 23/46]
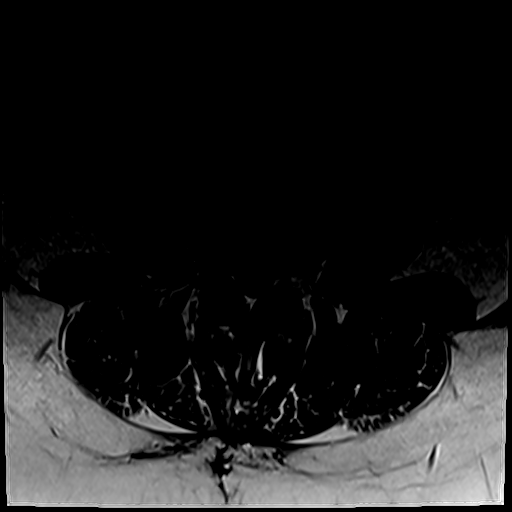
[im 28/46]
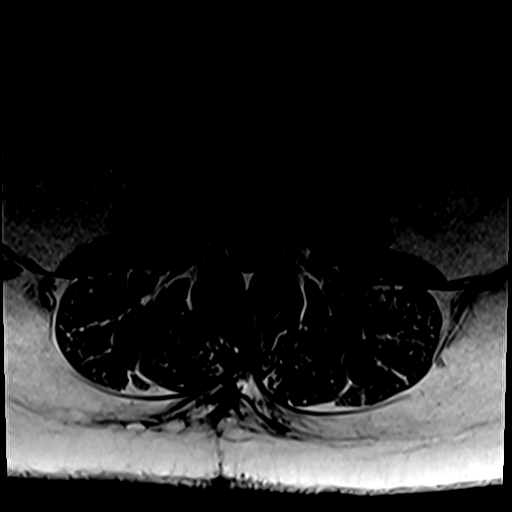
[im 32/46]
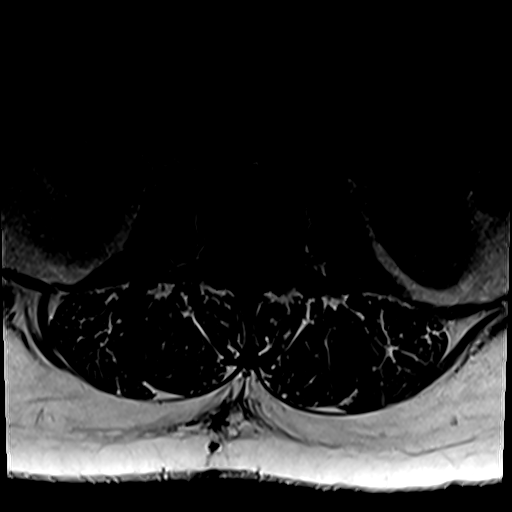
[im 37/46]
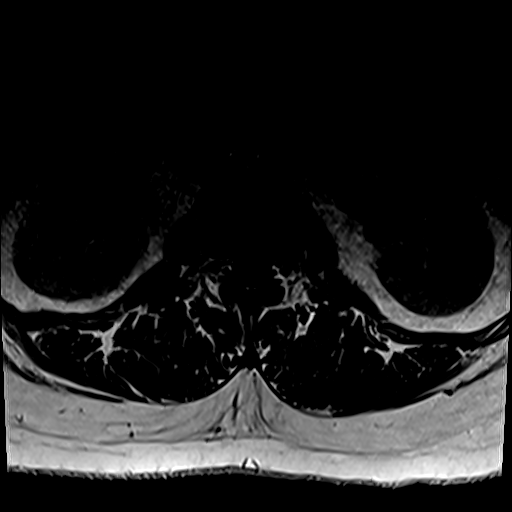
[im 41/46]
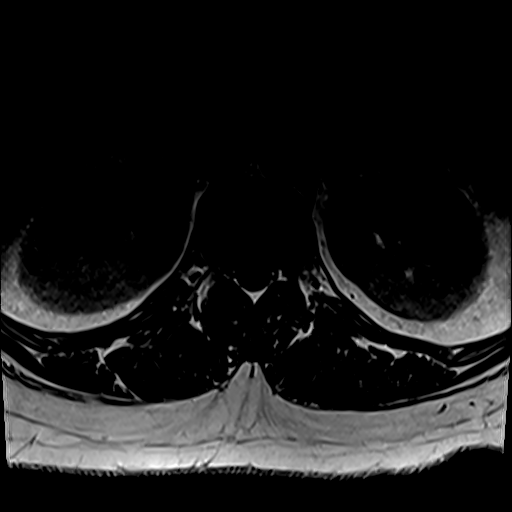
[im 46/46]
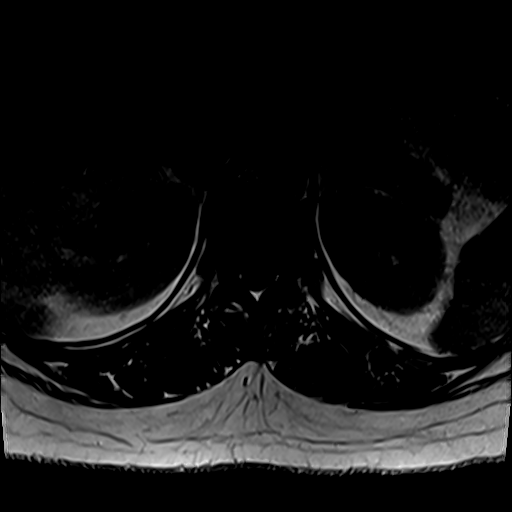

[Series 9: T2 post-contrast · sagittal · 4.0mm · 0.81mm/px · 4 of 15 slices shown]
[im 1/15]
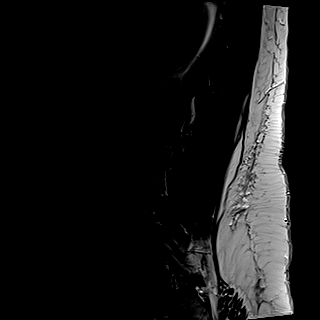
[im 5/15]
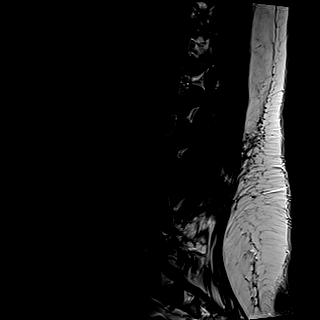
[im 10/15]
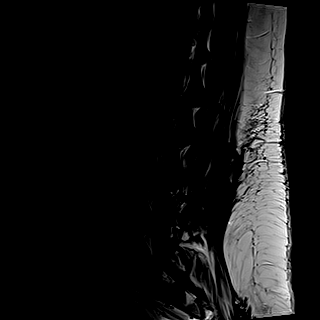
[im 15/15]
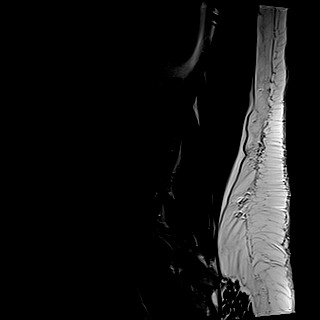

[Series 10: T1 fat-sat post-contrast · sagittal · 4.0mm · 0.81mm/px · 2 of 15 slices shown]
[im 1/15]
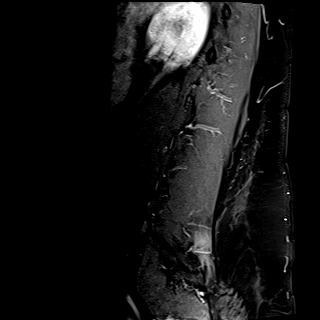
[im 5/15]
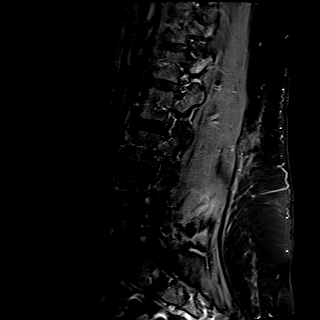

[31 of 48 positions shown; findings below may reference images not displayed]

FINDINGS: Segmentation:  Standard.

Alignment:  Physiologic.

Vertebrae: No fracture, evidence of discitis, or bone lesion.
Postsurgical changes from L4-5 laminectomy.

Conus medullaris and cauda equina: Conus extends to the L2 level.
Conus and cauda equina appear normal. No focus of abnormal contrast
enhancement to suggest residual/recurrent tumor.

Paraspinal and other soft tissues: Expected postsurgical changes in
the posterior paraspinal soft tissues. Otherwise, unremarkable.

Disc levels:

At L4-5, there is a shallow disc bulge and mild facet degenerative
changes without significant spinal canal or neural foraminal
stenosis. No significant disc bulge or herniation, spinal canal or
neural foraminal stenosis in the remainder of the lumbar spine.
IMPRESSION: Stable postsurgical changes at L4-5. No evidence of
residual/recurrent tumor.

## 2022-10-13 ENCOUNTER — Encounter (HOSPITAL_COMMUNITY): Payer: Self-pay

## 2022-10-13 ENCOUNTER — Other Ambulatory Visit: Payer: Self-pay

## 2022-10-13 ENCOUNTER — Ambulatory Visit: Admit: 2022-10-13 | Payer: BC Managed Care – PPO

## 2022-10-13 ENCOUNTER — Emergency Department (HOSPITAL_COMMUNITY)
Admission: EM | Admit: 2022-10-13 | Discharge: 2022-10-13 | Disposition: A | Discharge status: Home / Self Care | Payer: BC Managed Care – PPO | Attending: Emergency Medicine | Admitting: Emergency Medicine

## 2022-10-13 DIAGNOSIS — Z1152 Encounter for screening for COVID-19: Secondary | ICD-10-CM | POA: Insufficient documentation

## 2022-10-13 DIAGNOSIS — R059 Cough, unspecified: Secondary | ICD-10-CM | POA: Diagnosis present

## 2022-10-13 DIAGNOSIS — J111 Influenza due to unidentified influenza virus with other respiratory manifestations: Secondary | ICD-10-CM | POA: Insufficient documentation

## 2022-10-13 LAB — RESP PANEL BY RT-PCR (RSV, FLU A&B, COVID)  RVPGX2
Influenza A by PCR: NEGATIVE
Influenza B by PCR: POSITIVE — AB
Resp Syncytial Virus by PCR: NEGATIVE
SARS Coronavirus 2 by RT PCR: NEGATIVE

## 2022-10-13 MED ORDER — ALBUTEROL SULFATE (5 MG/ML) 0.5% IN NEBU: 2.5000 mg | INHALATION_SOLUTION | Freq: Four times a day (QID) | RESPIRATORY_TRACT | 12 refills | Status: DC | PRN

## 2022-10-13 MED ORDER — OXYMETAZOLINE HCL 0.05 % NA SOLN: 1.0000 | Freq: Two times a day (BID) | NASAL | 0 refills | Status: DC

## 2022-10-13 MED ORDER — OSELTAMIVIR PHOSPHATE 75 MG PO CAPS: 75.0000 mg | ORAL_CAPSULE | Freq: Two times a day (BID) | ORAL | 0 refills | Status: DC

## 2022-10-13 MED ORDER — BUDESONIDE-FORMOTEROL FUMARATE 80-4.5 MCG/ACT IN AERO: 2.0000 | INHALATION_SPRAY | Freq: Two times a day (BID) | RESPIRATORY_TRACT | 12 refills | Status: DC

## 2022-10-13 MED ORDER — IPRATROPIUM-ALBUTEROL 0.5-2.5 (3) MG/3ML IN SOLN
3.0000 mL | Freq: Once | RESPIRATORY_TRACT | Status: AC
Start: 2022-10-13 — End: 2022-10-13
  Administered 2022-10-13: 3 mL via RESPIRATORY_TRACT
  Filled 2022-10-13: qty 3

## 2022-10-13 MED ORDER — IBUPROFEN 800 MG PO TABS
800.0000 mg | ORAL_TABLET | Freq: Once | ORAL | Status: AC
  Administered 2022-10-13: 800 mg via ORAL
  Filled 2022-10-13: qty 1

## 2022-10-13 MED ORDER — GUAIFENESIN 100 MG/5ML PO LIQD
15.0000 mL | Freq: Four times a day (QID) | ORAL | 0 refills | Status: DC | PRN
Start: 2022-10-13 — End: 2023-05-14

## 2022-10-13 NOTE — ED Provider Notes (Signed)
[facility] EMERGENCY DEPARTMENT Provider Note   CSN: [id number] Arrival date & time: 10/13/22  1440     History  Chief Complaint  Patient presents with   Cough    Shannon [name] is a 41 y.o. female.  Patient is a 41 yo female presenting for body aches, subjective fevers and chills, nonproductive cough, and difficulty breathing x 3 days. Works in the hospital with sick patients. Took Motrin at midnight last night.     The history is provided by the patient. No language interpreter was used.  Cough Associated symptoms: chills, ear pain, fever and myalgias   Associated symptoms: no chest pain, no rash, no shortness of breath and no sore throat        Home Medications Prior to Admission medications   Medication Sig Start Date End Date Taking? Authorizing Provider  albuterol (PROVENTIL) (5 MG/ML) 0.5% nebulizer solution Take 0.5 mLs (2.5 mg total) by nebulization every 6 (six) hours as needed for wheezing or shortness of breath. 95/09/32  Yes Campbell Stall P, DO  budesonide-formoterol (SYMBICORT) 80-4.5 MCG/ACT inhaler Inhale 2 puffs into the lungs in the morning and at bedtime. 67/12/45  Yes Campbell Stall P, DO  guaiFENesin (ROBITUSSIN) 100 MG/5ML liquid Take 15 mLs by mouth every 6 (six) hours as needed for cough or to loosen phlegm. 80/99/83  Yes Campbell Stall P, DO  oseltamivir (TAMIFLU) 75 MG capsule Take 1 capsule (75 mg total) by mouth every 12 (twelve) hours. 38/25/05  Yes Campbell Stall P, DO  oxymetazoline (AFRIN NASAL SPRAY) 0.05 % nasal spray Place 1 spray into both nostrils 2 (two) times daily. 39/76/73  Yes Campbell Stall P, DO  Calcium Citrate-Vitamin D (CELEBRATE CALCIUM CITRATE) 500-12.5 MG-MCG CHEW Chew by mouth in the morning, at noon, and at bedtime.    [provider]  DULoxetine (CYMBALTA) 60 MG capsule Take 60 mg by mouth at bedtime. 03/07/21   [provider]  ibuprofen (ADVIL) 600 MG tablet Take 1 tablet (600 mg total) by mouth every 8 (eight)  hours as needed. 10/12/21   Everett Graff, MD  Multiple Vitamins-Minerals (CELEBRATE MULTI-COMPLETE 18) CHEW Chew by mouth daily.    [provider]  propranolol (INDERAL) 20 MG tablet Take 1 tablet by mouth twice daily 12/05/21   Vaslow, Acey Lav, MD  Relugolix-Estradiol-Norethind (MYFEMBREE) 40-1-0.5 MG TABS Myfembree 40 mg-1 mg-0.5 mg tablet    [provider]  zolpidem (AMBIEN) 10 MG tablet Take 10 mg by mouth at bedtime as needed. 12/05/21   [provider]      Allergies    Ceftriaxone, Diclofenac, Fluconazole, Pineapple, Latex, and Bactrim [sulfamethoxazole-trimethoprim]    Review of Systems   Review of Systems  Constitutional:  Positive for chills and fever.  HENT:  Positive for ear pain. Negative for sore throat.   Eyes:  Negative for pain and visual disturbance.  Respiratory:  Positive for cough. Negative for shortness of breath.   Cardiovascular:  Negative for chest pain and palpitations.  Gastrointestinal:  Negative for abdominal pain and vomiting.  Genitourinary:  Negative for dysuria and hematuria.  Musculoskeletal:  Positive for myalgias. Negative for arthralgias and back pain.  Skin:  Negative for color change and rash.  Neurological:  Negative for seizures and syncope.  All other systems reviewed and are negative.   Physical Exam Updated Vital Signs BP (!) 156/102   Pulse (!) 108   Temp 99.3 F (37.4 C) (Oral)   Resp 20   Ht '5\' 4"'$  (1.626  m)   Wt 104.3 kg   SpO2 100%   BMI 39.48 kg/m  Physical Exam Vitals and nursing note reviewed.  Constitutional:      General: She is not in acute distress.    Appearance: She is well-developed.  HENT:     Head: Normocephalic and atraumatic.  Eyes:     Conjunctiva/sclera: Conjunctivae normal.  Cardiovascular:     Rate and Rhythm: Regular rhythm. Tachycardia present.     Heart sounds: No murmur heard. Pulmonary:     Effort: Pulmonary effort is normal. Tachypnea present. No respiratory  distress.     Breath sounds: Normal breath sounds.  Abdominal:     Palpations: Abdomen is soft.     Tenderness: There is no abdominal tenderness.  Musculoskeletal:        General: No swelling.     Cervical back: Neck supple.  Skin:    General: Skin is warm and dry.     Capillary Refill: Capillary refill takes less than 2 seconds.  Neurological:     Mental Status: She is alert.  Psychiatric:        Mood and Affect: Mood normal.     ED Results / Procedures / Treatments   Labs (all labs ordered are listed, but only abnormal results are displayed) Labs Reviewed  RESP PANEL BY RT-PCR (RSV, FLU A&B, COVID)  [id number] - Abnormal; Notable for the following components:      Result Value   Influenza B by PCR POSITIVE (*)    All other components within normal limits    EKG None  Radiology No results found.  Procedures Procedures    Medications Ordered in ED Medications  ipratropium-albuterol (DUONEB) 0.5-2.5 (3) MG/3ML nebulizer solution 3 mL (3 mLs Nebulization Given 10/13/22 1643)  ibuprofen (ADVIL) tablet 800 mg (800 mg Oral Given 10/13/22 1627)    ED Course/ Medical Decision Making/ A&P                           Medical Decision Making Risk Prescription drug management.   5:33 PM  41 yo female presenting for body aches, subjective fevers and chills, nonproductive cough, and difficulty breathing x 3 days.  Is alert and oriented x 3, no distress, afebrile, no signs demonstrating mild tachycardia with tachypnea with respiratory rate of 20.  Patient does not have wheezing but she has diminished lung sounds bilaterally.  Concerns for acute asthma exacerbation secondary to viral illness.  DuoNeb treatment given.  Reevaluation patient has significant improvement of respiratory symptoms.  Still admits to myalgias despite Motrin.  Influenza positive.  Recommended for rest, increase hydration, vitamin C, and Tamiflu.  NO signs or symptoms of sepsis at this time.  Fill prescriptions  for albuterol patient's nebulizer, Symbicort inhaler, Afrin, and Robitussin prescribed.  Patient in no distress and overall condition improved here in the ED. Detailed discussions were had with the patient regarding current findings, and need for close f/u with PCP or on call doctor. The patient has been instructed to return immediately if the symptoms worsen in any way for re-evaluation. Patient verbalized understanding and is in agreement with current care plan. All questions answered prior to discharge.         Final Clinical Impression(s) / ED Diagnoses Final diagnoses:  Influenza    Rx / DC Orders ED Discharge Orders          Ordered    guaiFENesin (ROBITUSSIN) 100 MG/5ML liquid  Every  6 hours PRN        10/13/22 1733    oxymetazoline (AFRIN NASAL SPRAY) 0.05 % nasal spray  2 times daily       Note to Pharmacy: Recommendations to stop use after 7 days to decrease risk of rebound congestion   10/13/22 1733    oseltamivir (TAMIFLU) 75 MG capsule  Every 12 hours        10/13/22 1733    albuterol (PROVENTIL) (5 MG/ML) 0.5% nebulizer solution  Every 6 hours PRN        10/13/22 1733    budesonide-formoterol (SYMBICORT) 80-4.5 MCG/ACT inhaler  2 times daily        10/13/22 1733              Lianne Cure, DO 97/41/63 1733

## 2022-10-13 NOTE — Discharge Instructions (Addendum)
Rest, increase hydration, increase vitamin C intake.  Liquid doses of albuterol sent to pharmacy for nebulizer.  Symbicort inhaler (steroid inhaler) sent to pharmacy.  Please take 2 puffs in the morning and before bed for the next 2 weeks.  Robitussin sent for expectorant and cough suppression.  Afrin nasal spray sent to pharmacy for nasal congestion.  You can use twice a day (every 12 hours) for 7 days.  Please stop use after 7 days to prevent rebound nasal congestion.  Tamiflu sent

## 2022-10-13 NOTE — ED Triage Notes (Signed)
Patient complaining of cough, sore throat, body aches, headache, SHOB for the past three days. States that she has been using in haler more often and it has been less effective.

## 2022-11-27 ENCOUNTER — Ambulatory Visit: Payer: BC Managed Care – PPO | Admitting: Skilled Nursing Facility1

## 2022-12-11 ENCOUNTER — Ambulatory Visit (HOSPITAL_COMMUNITY)
Admission: RE | Admit: 2022-12-11 | Discharge: 2022-12-11 | Disposition: A | Discharge status: Home / Self Care | Payer: BC Managed Care – PPO | Source: Ambulatory Visit | Attending: Internal Medicine | Admitting: Internal Medicine

## 2022-12-11 ENCOUNTER — Other Ambulatory Visit (HOSPITAL_COMMUNITY): Payer: Self-pay | Admitting: Obstetrics and Gynecology

## 2022-12-11 DIAGNOSIS — D447 Neoplasm of uncertain behavior of aortic body and other paraganglia: Secondary | ICD-10-CM | POA: Diagnosis not present

## 2022-12-11 DIAGNOSIS — D259 Leiomyoma of uterus, unspecified: Secondary | ICD-10-CM

## 2022-12-11 MED ORDER — GADOBUTROL 1 MMOL/ML IV SOLN
10.0000 mL | Freq: Once | INTRAVENOUS | Status: AC | PRN
  Administered 2022-12-11: 10 mL via INTRAVENOUS

## 2022-12-13 ENCOUNTER — Other Ambulatory Visit: Payer: Self-pay

## 2022-12-13 ENCOUNTER — Inpatient Hospital Stay: Payer: BC Managed Care – PPO | Attending: Internal Medicine | Admitting: Internal Medicine

## 2022-12-13 VITALS — BP 140/96 | HR 88 | Temp 97.5°F | Resp 20 | Wt 234.1 lb

## 2022-12-13 DIAGNOSIS — Z79899 Other long term (current) drug therapy: Secondary | ICD-10-CM | POA: Diagnosis not present

## 2022-12-13 DIAGNOSIS — D447 Neoplasm of uncertain behavior of aortic body and other paraganglia: Secondary | ICD-10-CM | POA: Diagnosis not present

## 2022-12-13 DIAGNOSIS — C729 Malignant neoplasm of central nervous system, unspecified: Secondary | ICD-10-CM

## 2022-12-13 DIAGNOSIS — R519 Headache, unspecified: Secondary | ICD-10-CM | POA: Insufficient documentation

## 2022-12-13 NOTE — Progress Notes (Signed)
[facility] [place], [place] [address] [phone]   Interval Evaluation  Date of Service: 12/13/22 Patient Name: Shannon [name] Patient MRN: [id number] Patient DOB: [birth year] Provider: Ventura Sellers, MD  Identifying Statement:  Jennyfer Abby is a 42 y.o. female with  cauda equina   paraganglioma    Oncologic History 03/17/21: Laminectomy, resection by Dr. Saintclair Halsted.  Path demonstrates paraganglioma  Interval History: Darlena Fjelstad presents today after MRI spine.  She denies new or progressive changes.  Propanolol remains effective for nocturnal symptoms and headaches.  Continues to follow with cardiology, PCP.  H+P (04/13/21) Patient presented to medical attention in May 2022 with several months history of severe, refractory lower back pain.  Initially treated for sciatica, she eventually obtained an MRI which demonstrated a tumor within the roots of the cauda equina.  She also has complained of headaches, excess sweating, palpitations at times.  She underwent laminectomy and resection of the lesion with Dr. Saintclair Halsted on 5/27, path demonstrated paraganglioma.  Since then her pain is significantly improved.  There was some weakness after surgery but it improved with short rehab stint.  Over past month, no new or progressive deficits.  Medications: Current Outpatient Medications on File Prior to Visit  Medication Sig Dispense Refill   albuterol (PROVENTIL) (5 MG/ML) 0.5% nebulizer solution Take 0.5 mLs (2.5 mg total) by nebulization every 6 (six) hours as needed for wheezing or shortness of breath. 20 mL 12   budesonide-formoterol (SYMBICORT) 80-4.5 MCG/ACT inhaler Inhale 2 puffs into the lungs in the morning and at bedtime. 1 each 12   Calcium Citrate-Vitamin D (CELEBRATE CALCIUM CITRATE) 500-12.5 MG-MCG CHEW Chew by mouth in the morning, at noon, and at bedtime.     DULoxetine (CYMBALTA) 60 MG capsule Take 60 mg by mouth at bedtime.      guaiFENesin (ROBITUSSIN) 100 MG/5ML liquid Take 15 mLs by mouth every 6 (six) hours as needed for cough or to loosen phlegm. 60 mL 0   ibuprofen (ADVIL) 600 MG tablet Take 1 tablet (600 mg total) by mouth every 8 (eight) hours as needed. 30 tablet 1   Multiple Vitamins-Minerals (CELEBRATE MULTI-COMPLETE 18) CHEW Chew by mouth daily.     oseltamivir (TAMIFLU) 75 MG capsule Take 1 capsule (75 mg total) by mouth every 12 (twelve) hours. 10 capsule 0   oxymetazoline (AFRIN NASAL SPRAY) 0.05 % nasal spray Place 1 spray into both nostrils 2 (two) times daily. 30 mL 0   propranolol (INDERAL) 20 MG tablet Take 1 tablet by mouth twice daily 60 tablet 3   Relugolix-Estradiol-Norethind (MYFEMBREE) 40-1-0.5 MG TABS Myfembree 40 mg-1 mg-0.5 mg tablet     zolpidem (AMBIEN) 10 MG tablet Take 10 mg by mouth at bedtime as needed.     No current facility-administered medications on file prior to visit.    Allergies:  Allergies  Allergen Reactions   Ceftriaxone Hives and Rash    No SOB - patient willing to try again   Diclofenac Rash   Fluconazole Hives    Raw rash on abdomen and she had sores and swelling in mouth     Pineapple Shortness Of Breath   Latex Rash   Bactrim [Sulfamethoxazole-Trimethoprim] Hives   Past Medical History:  Past Medical History:  Diagnosis Date   Acid reflux 2022   ADD (attention deficit disorder)    Anemia 08/23/2021   on 09/27/21 HGB was 10.8 (Epic)   Anxiety    Asthma  Back pain    Depression    Family history of breast cancer 04/28/2021   Family history of ovarian cancer 04/28/2021   Family history of pancreatic cancer 04/28/2021   Hyperlipidemia    Hypertension    Preeclampsia post delivery   Multiple food allergies    pineapple and sometimes egg derivatives   Obesity    Paraganglioma ([place]) 02/2021   Lumbar   Vitamin D deficiency    Wears contact lenses    Wears glasses    Past Surgical History:  Past Surgical History:  Procedure Laterality Date    BIOPSY  07/25/2021   Procedure: BIOPSY;  Surgeon: Felicie Morn, MD;  Location: [facility];  Service: General;;   [place] N/A 10/12/2021   Procedure: DILATATION & CURETTAGE/HYSTEROSCOPY;  Surgeon: Everett Graff, MD;  Location: [facility];  Service: Gynecology;  Laterality: N/A;   DILATATION & CURRETTAGE/HYSTEROSCOPY WITH RESECTOCOPE N/A 02/03/2014   Procedure: DILATATION & CURETTAGE/HYSTEROSCOPY ;  Surgeon: Delice Lesch, MD;  Location: [facility];  Service: Gynecology;  Laterality: N/A;   DILATION AND CURETTAGE OF UTERUS  2001   ENDOMETRIAL ABLATION N/A 10/12/2021   Procedure: ENDOMETRIAL ABLATION;  Surgeon: Everett Graff, MD;  Location: [facility];  Service: Gynecology;  Laterality: N/A;   ESOPHAGOGASTRODUODENOSCOPY N/A 07/25/2021   Procedure: ESOPHAGOGASTRODUODENOSCOPY (EGD);  Surgeon: Felicie Morn, MD;  Location: [facility];  Service: General;  Laterality: N/A;   LAMINECTOMY N/A 03/17/2021   Procedure: Laminectomy - Lumbar Four-Lumbar Five for Intradural mass;  Surgeon: Kary Kos, MD;  Location: [facility];  Service: Neurosurgery;  Laterality: N/A;  3C   LAPAROSCOPIC GASTRIC SLEEVE RESECTION  09/26/2021   MYOMECTOMY N/A 02/03/2014   Procedure: MYOMECTOMY ;  Surgeon: Delice Lesch, MD;  Location: [facility];  Service: Gynecology;  Laterality: N/A;   SALPINGECTOMY  2016   TONSILLECTOMY     age 44   TUBAL LIGATION N/A 08/19/2015   Procedure: POST PARTUM TUBAL LIGATION;  Surgeon: Everett Graff, MD;  Location: [facility];  Service: Gynecology;  Laterality: N/A;   UPPER GI ENDOSCOPY N/A 09/26/2021   Procedure: UPPER GI ENDOSCOPY;  Surgeon: Felicie Morn, MD;  Location: [facility];  Service: General;  Laterality: N/A;   WISDOM TOOTH EXTRACTION     age 24   Social History:  Social History   Socioeconomic History   Marital status: Legally Separated    Spouse name: Not on file   Number of children: Not  on file   Years of education: Not on file   Highest education level: Not on file  Occupational History   Not on file  Tobacco Use   Smoking status: Never   Smokeless tobacco: Never  Vaping Use   Vaping Use: Never used  Substance and Sexual Activity   Alcohol use: Never   Drug use: No   Sexual activity: Yes    Birth control/protection: None  Other Topics Concern   Not on file  Social History Narrative   Not on file   Social Determinants of Health   Financial Resource Strain: Not on file  Food Insecurity: Not on file  Transportation Needs: Not on file  Physical Activity: Not on file  Stress: Not on file  Social Connections: Not on file  Intimate Partner Violence: Not on file   Family History:  Family History  Problem Relation Age of Onset   Hypertension Mother    Diabetes Mother    Mental illness Mother  Hyperlipidemia Mother    Stroke Mother    Depression Mother    Anxiety disorder Mother    Bipolar disorder Mother    Liver disease Mother    Sleep apnea Mother    Obesity Mother    Hypertension Father    Asthma Brother    Seizures Brother    Pancreatic cancer Maternal Aunt 10   Lung cancer Maternal Aunt        dx after 20; smoking hx   Liver cancer Maternal Uncle        dx after 31   Breast cancer Paternal Aunt        two paternal aunts, age 70s-40s   Ovarian cancer Paternal Aunt        two paternal aunts, reported BRCA mutation   Colon polyps Paternal Uncle        two paternal uncles; more than 73 lifetime polyps   Heart disease Maternal Grandmother    Hypertension Maternal Grandmother    Breast cancer Maternal Grandmother        dx 21s-40s; d. 52   Ovarian cancer Paternal Grandmother    Ovarian cancer Other        PGM's mother   Breast cancer Cousin        dx 24s   Ovarian cancer Cousin        dx 65s    Review of Systems: Constitutional: Doesn't report fevers, chills or abnormal weight loss Eyes: Doesn't report blurriness of vision Ears,  nose, mouth, throat, and face: Doesn't report sore throat Respiratory: Doesn't report cough, dyspnea or wheezes Cardiovascular: Doesn't report palpitation, chest discomfort  Gastrointestinal:  Doesn't report nausea, constipation, diarrhea GU: Doesn't report incontinence Skin: Doesn't report skin rashes Neurological: Per HPI Musculoskeletal: Doesn't report joint pain Behavioral/Psych: Doesn't report anxiety  Physical Exam: Vitals:   12/13/22 1200  BP: (!) 140/96  Pulse: 88  Resp: 20  Temp: (!) 97.5 F (36.4 C)  SpO2: 99%   KPS: 80. General: Alert, cooperative, pleasant, in no acute distress Head: Normal EENT: No conjunctival injection or scleral icterus.  Lungs: Resp effort normal Cardiac: Regular rate Abdomen: Non-distended abdomen Skin: No rashes cyanosis or petechiae. Extremities: No clubbing or edema  Neurologic Exam: Mental Status: Awake, alert, attentive to examiner. Oriented to self and environment. Language is fluent with intact comprehension.  Cranial Nerves: Visual acuity is grossly normal. Visual fields are full. Extra-ocular movements intact. No ptosis. Face is symmetric Motor: Tone and bulk are normal. Power is full in both arms and legs. Reflexes are diminished, no pathologic reflexes present.  Sensory: Intact to light touch Gait: Normal.   Labs: I have reviewed the data as listed    Component Value Date/Time   NA 140 11/14/2021 1839   NA 140 06/07/2021 0000   K 4.4 11/14/2021 1839   CL 108 11/14/2021 1839   CO2 26 11/14/2021 1839   GLUCOSE 81 11/14/2021 1839   BUN 17 11/14/2021 1839   BUN 9 06/07/2021 0000   CREATININE 0.84 11/14/2021 1839   CREATININE 0.77 11/20/2012 1535   CALCIUM 9.7 12/18/2021 1408   PROT 6.6 08/23/2021 1601   ALBUMIN 4.1 08/23/2021 1601   AST 19 08/23/2021 1601   ALT 15 08/23/2021 1601   ALKPHOS 111 08/23/2021 1601   BILITOT 0.3 08/23/2021 1601   GFRNONAA >60 11/14/2021 1839   GFRAA 92 08/08/2020 1653   Lab Results   Component Value Date   WBC 6.6 11/14/2021   NEUTROABS 3.5 11/14/2021  HGB 11.0 (L) 11/14/2021   HCT 35.0 (L) 11/14/2021   MCV 82.0 11/14/2021   PLT 200 11/14/2021    Imaging:  [facility] Clinician Interpretation: I have personally reviewed the CNS images as listed.  My interpretation, in the context of the patient's clinical presentation, is stable disease  No results found. 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  L-spine MRI does not demonstrate recurrence of tumor, although official read is still pending.  No other new or progressive changes.  We appreciate the opportunity to participate in the care of [place].   She may con't propanolol 51m HS.  We ask that Minola FSparhawkreturn to clinic in 12 months following next lumbar spine MRI, or sooner as needed.  All questions were answered. The patient knows to call the clinic with any problems, questions or concerns. No barriers to learning were detected.  The total time spent in the encounter was 30 minutes and more than 50% was on counseling and review of test results   ZVentura Sellers MD Medical Director of Neuro-Oncology [facility]02/22/24 12:14 PM

## 2022-12-17 ENCOUNTER — Other Ambulatory Visit: Payer: Self-pay | Admitting: Radiation Therapy

## 2022-12-17 ENCOUNTER — Ambulatory Visit (HOSPITAL_COMMUNITY)
Admission: RE | Admit: 2022-12-17 | Discharge: 2022-12-17 | Disposition: A | Discharge status: Home / Self Care | Payer: BC Managed Care – PPO | Source: Ambulatory Visit | Attending: Obstetrics and Gynecology | Admitting: Obstetrics and Gynecology

## 2022-12-17 DIAGNOSIS — D259 Leiomyoma of uterus, unspecified: Secondary | ICD-10-CM | POA: Insufficient documentation

## 2022-12-18 ENCOUNTER — Other Ambulatory Visit: Payer: Self-pay | Admitting: Obstetrics and Gynecology

## 2022-12-26 ENCOUNTER — Other Ambulatory Visit: Payer: Self-pay | Admitting: Obstetrics and Gynecology

## 2023-01-14 ENCOUNTER — Other Ambulatory Visit: Payer: Self-pay | Admitting: Obstetrics and Gynecology

## 2023-01-15 NOTE — Pre-Procedure Instructions (Signed)
Surgical Instructions    Your procedure is scheduled on January 23, 2023.  Report to [facility] Main Entrance "A" at 6:30 A.M., then check in with the Admitting office.  Call this number if you have problems the morning of surgery:  [phone]  If you have any questions prior to your surgery date call [phone]: Open Monday-Friday 8am-4pm If you experience any cold or flu symptoms such as cough, fever, chills, shortness of breath, etc. between now and your scheduled surgery, please notify us at the above number.     Remember:  Do not eat after midnight the night before your surgery  You may drink clear liquids until 5:30 AM the morning of your surgery.   Clear liquids allowed are: Water, Non-Citrus Juices (without pulp), Carbonated Beverages, Clear Tea, Black Coffee Only (NO MILK, CREAM OR POWDERED CREAMER of any kind), and Gatorade.     Take these medicines the morning of surgery with A SIP OF WATER:  budesonide-formoterol (SYMBICORT) inhaler   pantoprazole (PROTONIX)    May take these medicines IF NEEDED:  albuterol (PROVENTIL) nebulizer solution   albuterol (VENTOLIN HFA) inhaler    As of today, STOP taking any Aspirin (unless otherwise instructed by your surgeon) Aleve, Naproxen, Ibuprofen, Motrin, Advil, Goody's, BC's, all herbal medications, fish oil, and all vitamins.                     Do NOT Smoke (Tobacco/Vaping) for 24 hours prior to your procedure.  If you use a CPAP at night, you may bring your mask/headgear for your overnight stay.   Contacts, glasses, piercing's, hearing aid's, dentures or partials may not be worn into surgery, please bring cases for these belongings.    For patients admitted to the hospital, discharge time will be determined by your treatment team.   Patients discharged the day of surgery will not be allowed to drive home, and someone needs to stay with them for 24 hours.  SURGICAL WAITING ROOM VISITATION Patients having surgery or a  procedure may have no more than 2 support people in the waiting area - these visitors may rotate.   Children under the age of 9 must have an adult with them who is not the patient. If the patient needs to stay at the hospital during part of their recovery, the visitor guidelines for inpatient rooms apply. Pre-op nurse will coordinate an appropriate time for 1 support person to accompany patient in pre-op.  This support person may not rotate.   Please refer to the [facility] website for the visitor guidelines for Inpatients (after your surgery is over and you are in a regular room).    Special instructions:   [place]- Preparing For Surgery  Before surgery, you can play an important role. Because skin is not sterile, your skin needs to be as free of germs as possible. You can reduce the number of germs on your skin by washing with CHG (chlorahexidine gluconate) Soap before surgery.  CHG is an antiseptic cleaner which kills germs and bonds with the skin to continue killing germs even after washing.    Oral Hygiene is also important to reduce your risk of infection.  Remember - BRUSH YOUR TEETH THE MORNING OF SURGERY WITH YOUR REGULAR TOOTHPASTE  Please do not use if you have an allergy to CHG or antibacterial soaps. If your skin becomes reddened/irritated stop using the CHG.  Do not shave (including legs and underarms) for at least 48 hours prior to  first CHG shower. It is OK to shave your face.  Please follow these instructions carefully.   Shower the NIGHT BEFORE SURGERY and the MORNING OF SURGERY  If you chose to wash your hair, wash your hair first as usual with your normal shampoo.  After you shampoo, rinse your hair and body thoroughly to remove the shampoo.  Use CHG Soap as you would any other liquid soap. You can apply CHG directly to the skin and wash gently with a scrungie or a clean washcloth.   Apply the CHG Soap to your body ONLY FROM THE NECK DOWN.  Do not use on open  wounds or open sores. Avoid contact with your eyes, ears, mouth and genitals (private parts). Wash Face and genitals (private parts)  with your normal soap.   Wash thoroughly, paying special attention to the area where your surgery will be performed.  Thoroughly rinse your body with warm water from the neck down.  DO NOT shower/wash with your normal soap after using and rinsing off the CHG Soap.  Pat yourself dry with a CLEAN TOWEL.  Wear CLEAN PAJAMAS to bed the night before surgery  Place CLEAN SHEETS on your bed the night before your surgery  DO NOT SLEEP WITH PETS.   Day of Surgery: Take a shower with CHG soap. Do not wear jewelry or makeup Do not wear lotions, powders, perfumes/colognes, or deodorant. Do not shave 48 hours prior to surgery.  Men may shave face and neck. Do not bring valuables to the hospital.  [facility] is not responsible for any belongings or valuables. Do not wear nail polish, gel polish, artificial nails, or any other type of covering on natural nails (fingers and toes) If you have artificial nails or gel coating that need to be removed by a nail salon, please have this removed prior to surgery. Artificial nails or gel coating may interfere with anesthesia's ability to adequately monitor your vital signs.  Wear Clean/Comfortable clothing the morning of surgery Remember to brush your teeth WITH YOUR REGULAR TOOTHPASTE.   Please read over the following fact sheets that you were given.    If you received a COVID test during your pre-op visit  it is requested that you wear a mask when out in public, stay away from anyone that may not be feeling well and notify your surgeon if you develop symptoms. If you have been in contact with anyone that has tested positive in the last 10 days please notify you surgeon.

## 2023-01-16 ENCOUNTER — Inpatient Hospital Stay (HOSPITAL_COMMUNITY)
Admission: RE | Admit: 2023-01-16 | Discharge: 2023-01-16 | Disposition: A | Discharge status: Home / Self Care | Payer: BC Managed Care – PPO | Source: Ambulatory Visit

## 2023-01-17 ENCOUNTER — Telehealth: Payer: Self-pay | Admitting: Genetic Counselor

## 2023-01-17 NOTE — Telephone Encounter (Signed)
Received VM that patient wishes to receive copy of genetics report.  Returned call to let her know where she can see genetics report in [facility].  Sent copy to home address.  Released copy via [facility] portal.  Let her know that we would be happy to send via secure email she lets me know which email address she would prefer it to be sent to.

## 2023-01-24 ENCOUNTER — Other Ambulatory Visit: Payer: Self-pay | Admitting: Obstetrics and Gynecology

## 2023-01-31 ENCOUNTER — Inpatient Hospital Stay: Admit: 2023-01-31 | Payer: BC Managed Care – PPO | Admitting: Obstetrics and Gynecology

## 2023-02-13 ENCOUNTER — Inpatient Hospital Stay (HOSPITAL_COMMUNITY): Admission: RE | Admit: 2023-02-13 | Payer: BC Managed Care – PPO | Source: Ambulatory Visit

## 2023-02-20 ENCOUNTER — Encounter (HOSPITAL_COMMUNITY): Payer: Self-pay

## 2023-02-20 ENCOUNTER — Emergency Department (HOSPITAL_COMMUNITY): Payer: BC Managed Care – PPO

## 2023-02-20 ENCOUNTER — Other Ambulatory Visit: Payer: Self-pay

## 2023-02-20 ENCOUNTER — Emergency Department (HOSPITAL_COMMUNITY)
Admission: EM | Admit: 2023-02-20 | Discharge: 2023-02-21 | Disposition: A | Discharge status: Home / Self Care | Payer: BC Managed Care – PPO | Attending: Emergency Medicine | Admitting: Emergency Medicine

## 2023-02-20 DIAGNOSIS — Z79899 Other long term (current) drug therapy: Secondary | ICD-10-CM | POA: Insufficient documentation

## 2023-02-20 DIAGNOSIS — I10 Essential (primary) hypertension: Secondary | ICD-10-CM | POA: Insufficient documentation

## 2023-02-20 DIAGNOSIS — R030 Elevated blood-pressure reading, without diagnosis of hypertension: Secondary | ICD-10-CM

## 2023-02-20 DIAGNOSIS — R072 Precordial pain: Secondary | ICD-10-CM | POA: Diagnosis not present

## 2023-02-20 DIAGNOSIS — Z91040 Latex allergy status: Secondary | ICD-10-CM | POA: Diagnosis not present

## 2023-02-20 DIAGNOSIS — R079 Chest pain, unspecified: Secondary | ICD-10-CM | POA: Diagnosis present

## 2023-02-20 LAB — BASIC METABOLIC PANEL
Anion gap: 6 (ref 5–15)
BUN: 12 mg/dL (ref 6–20)
CO2: 28 mmol/L (ref 22–32)
Calcium: 8.7 mg/dL — ABNORMAL LOW (ref 8.9–10.3)
Chloride: 103 mmol/L (ref 98–111)
Creatinine, Ser: 0.89 mg/dL (ref 0.44–1.00)
GFR, Estimated: >60 mL/min (ref >60)
Glucose, Bld: 100 mg/dL — ABNORMAL HIGH (ref 70–99)
Potassium: 3.7 mmol/L (ref 3.5–5.1)
Sodium: 137 mmol/L (ref 135–145)

## 2023-02-20 LAB — CBC
HCT: 36.9 % (ref 36.0–46.0)
Hemoglobin: 12 g/dL (ref 12.0–15.0)
MCH: 27.6 pg (ref 26.0–34.0)
MCHC: 32.5 g/dL (ref 30.0–36.0)
MCV: 84.8 fL (ref 80.0–100.0)
Platelets: 225 10*3/uL (ref 150–400)
RBC: 4.35 MIL/uL (ref 3.87–5.11)
RDW: 12.3 % (ref 11.5–15.5)
WBC: 8.6 10*3/uL (ref 4.0–10.5)
nRBC: 0 % (ref 0.0–0.2)

## 2023-02-20 LAB — TROPONIN I (HIGH SENSITIVITY): Troponin I (High Sensitivity): <2 ng/L (ref <18)

## 2023-02-20 SURGERY — HYSTERECTOMY, ABDOMINAL
Anesthesia: General

## 2023-02-20 MED ORDER — OXYCODONE-ACETAMINOPHEN 5-325 MG PO TABS
1.0000 | ORAL_TABLET | Freq: Once | ORAL | Status: AC
  Administered 2023-02-20: 1 via ORAL
  Filled 2023-02-20: qty 1

## 2023-02-20 MED ORDER — ALUM & MAG HYDROXIDE-SIMETH 200-200-20 MG/5ML PO SUSP
30.0000 mL | Freq: Once | ORAL | Status: AC
  Administered 2023-02-20: 30 mL via ORAL
  Filled 2023-02-20: qty 30

## 2023-02-20 NOTE — ED Triage Notes (Signed)
Pt to ED c/o chest tightness x 2-3 days with "feeling like heart was beating fast" at times, as well as being t"ired for no reason"

## 2023-02-20 NOTE — ED Notes (Signed)
Pt ambulatory to restroom without assist.

## 2023-02-20 NOTE — ED Provider Notes (Signed)
[place] EMERGENCY DEPARTMENT AT [facility] Provider Note   CSN: [id number] Arrival date & time: 02/20/23  2020     History  Chief Complaint  Patient presents with   Chest Pain    Shannon [name] is a 42 y.o. female.  Patient with history of morbid obesity, anxiety, hypertension, hyperlipidemia presents today with complaints of chest pain. She states that same has been ongoing for the past 2-3 days. She states she will have intervals where she feels like her her heart is racing with associated pain that feels more like tightness. It comes and goes without discernable triggers. Pain is throughout her chest and does not radiate. She states she originally thought that her symptoms were due to anxiety, however she is not sure. She states that she had a cold about a week ago and since then has been feeling more tired than normal. She denies headaches, cough, congestion, fevers, chills, shortness of breath, nausea, vomiting. Denies any leg pain, recent travel, recent surgeries.  She is not on OCPs.  She denies any cardiac history.  She does not smoke.  She denies any history of similar symptoms previously. She has not tried anything for her symptoms. She does state that her legs have been more swollen in the past few days which is unusual for her.   The history is provided by the patient. No language interpreter was used.  Chest Pain      Home Medications Prior to Admission medications   Medication Sig Start Date End Date Taking? Authorizing Provider  albuterol (PROVENTIL) (5 MG/ML) 0.5% nebulizer solution Take 0.5 mLs (2.5 mg total) by nebulization every 6 (six) hours as needed for wheezing or shortness of breath. 10/13/22   Edwin Dada P, DO  albuterol (VENTOLIN HFA) 108 (90 Base) MCG/ACT inhaler Inhale 1-2 puffs into the lungs every 6 (six) hours as needed for wheezing or shortness of breath.    [provider]  budesonide-formoterol (SYMBICORT) 80-4.5 MCG/ACT inhaler  Inhale 2 puffs into the lungs in the morning and at bedtime. 10/13/22   Franne Forts, DO  Calcium Citrate-Vitamin D (CELEBRATE CALCIUM CITRATE) 500-12.5 MG-MCG CHEW Chew 1 tablet by mouth in the morning and at bedtime.    [provider]  DULoxetine (CYMBALTA) 60 MG capsule Take 60 mg by mouth at bedtime. 03/07/21   [provider]  guaiFENesin (ROBITUSSIN) 100 MG/5ML liquid Take 15 mLs by mouth every 6 (six) hours as needed for cough or to loosen phlegm. Patient not taking: Reported on 01/11/2023 10/13/22   Edwin Dada P, DO  ibuprofen (ADVIL) 200 MG tablet Take 400 mg by mouth every 6 (six) hours as needed for moderate pain.    [provider]  ibuprofen (ADVIL) 600 MG tablet Take 1 tablet (600 mg total) by mouth every 8 (eight) hours as needed. Patient not taking: Reported on 01/11/2023 10/12/21   Osborn Coho, MD  Multiple Vitamins-Minerals (CELEBRATE MULTI-COMPLETE 18) CHEW Chew 1 tablet by mouth daily.    [provider]  oseltamivir (TAMIFLU) 75 MG capsule Take 1 capsule (75 mg total) by mouth every 12 (twelve) hours. Patient not taking: Reported on 01/11/2023 10/13/22   Edwin Dada P, DO  oxymetazoline (AFRIN NASAL SPRAY) 0.05 % nasal spray Place 1 spray into both nostrils 2 (two) times daily. Patient not taking: Reported on 01/11/2023 10/13/22   Edwin Dada P, DO  pantoprazole (PROTONIX) 40 MG tablet Take 40 mg by mouth daily. 11/13/22   [provider]  propranolol (INDERAL) 20 MG tablet Take 1 tablet by mouth twice daily Patient taking differently: Take 40 mg by mouth at bedtime. 12/05/21   Henreitta Leber, MD  zolpidem (AMBIEN) 10 MG tablet Take 10 mg by mouth at bedtime. 12/05/21   [provider]      Allergies    Ceftriaxone, Diclofenac, Fluconazole, Pineapple, Latex, and Bactrim [sulfamethoxazole-trimethoprim]    Review of Systems   Review of Systems  Cardiovascular:  Positive for chest pain.  All other systems reviewed  and are negative.   Physical Exam Updated Vital Signs BP (!) 136/99 (BP Location: Right Arm)   Pulse 78   Temp 99 F (37.2 C) (Oral)   Resp 18   Ht 5\' 4"  (1.626 m)   Wt 99.8 kg   SpO2 99%   BMI 37.76 kg/m  Physical Exam Vitals and nursing note reviewed.  Constitutional:      General: She is not in acute distress.    Appearance: Normal appearance. She is normal weight. She is not ill-appearing, toxic-appearing or diaphoretic.  HENT:     Head: Normocephalic and atraumatic.  Cardiovascular:     Rate and Rhythm: Normal rate and regular rhythm.     Pulses:          Dorsalis pedis pulses are 2+ on the right side and 2+ on the left side.       Posterior tibial pulses are 2+ on the right side and 2+ on the left side.     Heart sounds: Normal heart sounds.  Pulmonary:     Effort: Pulmonary effort is normal. No respiratory distress.     Breath sounds: Normal breath sounds.  Abdominal:     Palpations: Abdomen is soft.     Tenderness: There is no abdominal tenderness.  Musculoskeletal:        General: Normal range of motion.     Cervical back: Normal range of motion.     Right lower leg: No tenderness.     Left lower leg: No tenderness.     Comments: Trace bilateral non-pitting edema present in bilateral lower extremities. No erythema, warmth, fluctuance, or induration. DP and PT pulses intact and 2+. No calf tenderness.  Skin:    General: Skin is warm and dry.  Neurological:     General: No focal deficit present.     Mental Status: She is alert.  Psychiatric:        Mood and Affect: Mood normal.        Behavior: Behavior normal.     ED Results / Procedures / Treatments   Labs (all labs ordered are listed, but only abnormal results are displayed) Labs Reviewed  BASIC METABOLIC PANEL - Abnormal; Notable for the following components:      Result Value   Glucose, Bld 100 (*)    Calcium 8.7 (*)    All other components within normal limits  CBC  POC URINE PREG, ED   TROPONIN I (HIGH SENSITIVITY)    EKG EKG Interpretation  Date/Time:  Wednesday Feb 20 2023 20:36:27 EDT Ventricular Rate:  84 PR Interval:  140 QRS Duration: 84 QT Interval:  354 QTC Calculation: 419 R Axis:   61 Text Interpretation: Sinus rhythm Abnormal R-wave progression, early transition No significant change since last tracing Confirmed by Jacalyn Lefevre [phone]) on 02/20/2023 9:45:06 PM  Radiology DG Chest 2 View  Result Date: 02/20/2023 CLINICAL DATA:  Chest tightness x3 days. EXAM: CHEST - 2 VIEW COMPARISON:  May 05, 2013 FINDINGS: The heart size and mediastinal contours are within normal limits. Both lungs are clear. The visualized skeletal structures are unremarkable. IMPRESSION: No active cardiopulmonary disease. Electronically Signed   By: Aram Candela M.D.   On: 02/20/2023 21:27    Procedures Procedures    Medications Ordered in ED Medications  oxyCODONE-acetaminophen (PERCOCET/ROXICET) 5-325 MG per tablet 1 tablet (1 tablet Oral Given 02/20/23 2225)  alum & mag hydroxide-simeth (MAALOX/MYLANTA) 200-200-20 MG/5ML suspension 30 mL (30 mLs Oral Given 02/20/23 2225)    ED Course/ Medical Decision Making/ A&P                             Medical Decision Making Amount and/or Complexity of Data Reviewed Labs: ordered. Radiology: ordered.  Risk Prescription drug management.   This patient is a 42 y.o. female who presents to the ED for concern of chest pain, this involves an extensive number of treatment options, and is a complaint that carries with it a high risk of complications and morbidity. The emergent differential diagnosis prior to evaluation includes, but is not limited to,  ACS, pericarditis, myocarditis, aortic dissection, PE, pneumothorax, esophageal spasm or rupture, chronic angina, pneumonia, bronchitis, GERD, reflux/PUD, biliary disease, pancreatitis, costochondritis, anxiety  This is not an exhaustive differential.   Past Medical History /  Co-morbidities / Social History: history of morbid obesity, anxiety, hypertension, hyperlipidemia  Physical Exam: Physical exam performed. The pertinent findings include: no pertinent physical exam findings  Lab Tests: I ordered, and personally interpreted labs.  The pertinent results include: No acute laboratory findings   Imaging Studies: I ordered imaging studies including CXR. I independently visualized and interpreted imaging which showed NAD. I agree with the radiologist interpretation.   Cardiac Monitoring:  The patient was maintained on a cardiac monitor.  My attending physician Dr. Particia Nearing viewed and interpreted the cardiac monitored which showed an underlying rhythm of: sinus rhythm, no STEMI. I agree with this interpretation.   Medications: I ordered medication including percocet and GI cocktail  for pain. Reevaluation of the patient after these medicines showed that the patient improved. I have reviewed the patients home medicines and have made adjustments as needed.   Disposition: After consideration of the diagnostic results and the patients response to treatment, I feel that emergency department workup does not suggest an emergent condition requiring admission or immediate intervention beyond what has been performed at this time. The plan is: discharge with close outpatient follow-up. She has a HEART score 0, low risk. She is PERC negative.  She is afebrile, nontoxic-appearing, and in no acute distress with reassuring vital signs.  She does have trace edema that is nonpitting in her bilateral lower extremities.  She also has slightly elevated blood pressures.  I have discussed with her that she should buy a blood pressure cuff and check her readings at home and bring them to her primary care doctor at her next appointment.  I have also emphasized importance of elevating her legs, wearing compression stockings, and eating a low-salt diet.  I do not think that this is a heart failure  issue given she is not short of breath and has no edema on her chest x-ray.  I have thoroughly discussed this with the patient who is understanding and in agreement.  The patient is safe for discharge and has been instructed to return immediately for worsening symptoms, change in symptoms or any other concerns.  Final Clinical Impression(s) /  ED Diagnoses Final diagnoses:  Precordial chest pain  Elevated blood pressure reading    Rx / DC Orders ED Discharge Orders     None     An After Visit Summary was printed and given to the patient.     Vear Clock 02/20/23 2347    Jacalyn Lefevre, MD 02/21/23 [phone]

## 2023-02-20 NOTE — ED Notes (Signed)
Pt back from XR 

## 2023-02-20 NOTE — Discharge Instructions (Signed)
As we discussed, your workup in the ER today was reassuring for acute findings.  Laboratory evaluation and x-ray imaging did not reveal any emergent concerns.  I do recommend that you check your blood pressure daily and record your findings and bring them to your primary care appointment to discuss potential medication management.  I also recommend that you keep your legs elevated is much as possible, wear compression stockings, and consume a low-salt diet to help reduce the swelling in your legs.  If this gets worse or you become significantly short of breath, I would recommend following up in the emergency department for evaluation.  Return if development of any new or worsening symptoms.

## 2023-05-14 ENCOUNTER — Ambulatory Visit: Payer: BC Managed Care – PPO | Admitting: Family Medicine

## 2023-05-14 ENCOUNTER — Encounter: Payer: Self-pay | Admitting: Family Medicine

## 2023-05-14 VITALS — BP 118/85 | HR 77 | Temp 98.0°F | Ht 64.0 in | Wt 237.0 lb

## 2023-05-14 DIAGNOSIS — R413 Other amnesia: Secondary | ICD-10-CM

## 2023-05-14 DIAGNOSIS — Z9884 Bariatric surgery status: Secondary | ICD-10-CM

## 2023-05-14 DIAGNOSIS — Z136 Encounter for screening for cardiovascular disorders: Secondary | ICD-10-CM

## 2023-05-14 DIAGNOSIS — F988 Other specified behavioral and emotional disorders with onset usually occurring in childhood and adolescence: Secondary | ICD-10-CM

## 2023-05-14 DIAGNOSIS — E559 Vitamin D deficiency, unspecified: Secondary | ICD-10-CM

## 2023-05-14 DIAGNOSIS — F332 Major depressive disorder, recurrent severe without psychotic features: Secondary | ICD-10-CM

## 2023-05-14 DIAGNOSIS — C729 Malignant neoplasm of central nervous system, unspecified: Secondary | ICD-10-CM

## 2023-05-14 LAB — CMP14+EGFR
AST: 19 IU/L (ref 0–40)
Chloride: 106 mmol/L (ref 96–106)
Creatinine, Ser: 0.92 mg/dL (ref 0.57–1.00)
Total Protein: 6.1 g/dL (ref 6.0–8.5)
eGFR: 80 mL/min/{1.73_m2} (ref >59)

## 2023-05-14 LAB — ANEMIA PROFILE B
Basophils Absolute: 0 10*3/uL (ref 0.0–0.2)
Immature Granulocytes: 0 %
Lymphocytes Absolute: 2.1 10*3/uL (ref 0.7–3.1)
Lymphs: 43 %
MCH: 27.2 pg (ref 26.6–33.0)
Monocytes Absolute: 0.4 10*3/uL (ref 0.1–0.9)
Neutrophils: 44 %
Platelets: 199 10*3/uL (ref 150–450)
Retic Ct Pct: 1 % (ref 0.6–2.6)

## 2023-05-14 LAB — LIPID PANEL

## 2023-05-14 LAB — THYROID PANEL WITH TSH

## 2023-05-14 NOTE — Progress Notes (Signed)
New Patient Office Visit  Subjective   Patient ID: [name], female    DOB: Mar 15, 1981  Age: 42 y.o. MRN: [id number]  CC:  Chief Complaint  Patient presents with   New Patient (Initial Visit)    Establish care    HPI [name] presents to establish care. She states that her previous providers disagreed on her medication management.  Reports that psychiatry, [name] was previously prescribing Vyvanse. Her primary care provider, [name] did not not agree with the prescription due to her HR was increased and was "wonky".  This was approximately 11 months ago. Reports that she completed Cardiology evaluation and was approved to continue vyvanse.  Shannon [name] cymbalta and [name]. Patient has appt later today.   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   Gynecology  States that she is in the process of doing genetic testing due to multiple family histories of cancer. States that she is looking into completing hysterectomy.   Memory problem States that she has trouble with her memory and lack of motivation. Reports that she is struggling at work to stay focused. Reports that this started 2 years ago after her surgery. She states that she loves her job, she just feels like she has slower processing. States that she started stuttering and losing her words   Morbid Obesity  States that she has not followed with Bariatrics/ Weight loss clinic for the last 3 months.  She had Bariatric surgery on 09/26/2022. Believes that she had it at [name]. She says that she wants to go back when she feels like her mind is back in the right place.    GERD  States that since Bariatric Surgery she has had acid reflux.   Outpatient Encounter Medications as of 05/14/2023  Medication Sig   albuterol (PROVENTIL) (5 MG/ML) 0.5% nebulizer solution Take 0.5 mLs (2.5 mg total) by nebulization every 6 (six) hours as needed for wheezing  [name] shortness of breath.   albuterol (VENTOLIN HFA) 108 (90 Base) MCG/ACT inhaler Inhale 1-2 puffs into the lungs every 6 (six) hours as needed for wheezing [name] shortness of breath.   budesonide-formoterol (SYMBICORT) 80-4.5 MCG/ACT inhaler Inhale 2 puffs into the lungs in the morning and at bedtime.   DULoxetine (CYMBALTA) 60 MG capsule Take 60 mg by mouth at bedtime.   Multiple Vitamins-Minerals (CELEBRATE MULTI-COMPLETE 18) CHEW Chew 1 tablet by mouth daily.   MYFEMBREE 40-1-0.5 MG TABS Take 1 tablet by mouth daily.   pantoprazole (PROTONIX) 40 MG tablet Take 40 mg by mouth daily.   propranolol (INDERAL) 20 MG tablet Take 1 tablet by mouth twice daily (Patient taking differently: Take 40 mg by mouth at bedtime.)   zolpidem (AMBIEN) 10 MG tablet Take 10 mg by mouth at bedtime.   [DISCONTINUED] Calcium Citrate-Vitamin D (CELEBRATE CALCIUM CITRATE) 500-12.5 MG-MCG CHEW Chew 1 tablet by mouth in the morning and at bedtime.   [DISCONTINUED] guaiFENesin (ROBITUSSIN) 100 MG/5ML liquid Take 15 mLs by mouth every 6 (six) hours as needed for cough [name] to loosen phlegm. (Patient not taking: Reported on 01/11/2023)   [DISCONTINUED] ibuprofen (ADVIL) 200 MG tablet Take 400 mg by mouth every 6 (six) hours as needed for moderate pain.   [DISCONTINUED] ibuprofen (ADVIL) 600 MG tablet Take 1 tablet (600 mg total) by mouth every 8 (eight) hours as needed. (Patient not taking: Reported on 01/11/2023)   [DISCONTINUED] oseltamivir (TAMIFLU) 75 MG capsule Take 1 capsule (75 mg  total) by mouth every 12 (twelve) hours. (Patient not taking: Reported on 01/11/2023)   [DISCONTINUED] oxymetazoline (AFRIN NASAL SPRAY) 0.05 % nasal spray Place 1 spray into both nostrils 2 (two) times daily. (Patient not taking: Reported on 01/11/2023)   No facility-administered encounter medications on file as of 05/14/2023.    Past Medical History:  Diagnosis Date   Acid reflux 2022   ADD (attention deficit disorder)    Anemia 08/23/2021   on  09/27/21 HGB was 10.8 (Epic)   Anxiety    Asthma    Back pain    Depression    Family history of breast cancer 04/28/2021   Family history of ovarian cancer 04/28/2021   Family history of pancreatic cancer 04/28/2021   Hyperlipidemia    Hypertension    Preeclampsia post delivery   Multiple food allergies    pineapple and sometimes egg derivatives   Obesity    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   Vitamin D deficiency    Wears contact lenses    Wears glasses     Past Surgical History:  Procedure Laterality Date   BIOPSY  07/25/2021   Procedure: BIOPSY;  Surgeon: [facility] Ore, MD;  Location: [name];  Service: General;;   DILATATION & CURETTAGE/HYSTEROSCOPY WITH MYOSURE N/A 10/12/2021   Procedure: DILATATION & CURETTAGE/HYSTEROSCOPY;  Surgeon: Osborn Coho, MD;  Location: [name];  Service: Gynecology;  Laterality: N/A;   DILATATION & CURRETTAGE/HYSTEROSCOPY WITH RESECTOCOPE N/A 02/03/2014   Procedure: DILATATION & CURETTAGE/HYSTEROSCOPY ;  Surgeon: [facility] Nails, MD;  Location: [name];  Service: Gynecology;  Laterality: N/A;   DILATION AND CURETTAGE OF UTERUS  2001   ENDOMETRIAL ABLATION N/A 10/12/2021   Procedure: ENDOMETRIAL ABLATION;  Surgeon: Osborn Coho, MD;  Location: [name];  Service: Gynecology;  Laterality: N/A;   ESOPHAGOGASTRODUODENOSCOPY N/A 07/25/2021   Procedure: ESOPHAGOGASTRODUODENOSCOPY (EGD);  Surgeon: [facility] Ore, MD;  Location: [name];  Service: General;  Laterality: N/A;   LAMINECTOMY N/A 03/17/2021   Procedure: Laminectomy - Lumbar Four-Lumbar Five for Intradural mass;  Surgeon: [facility] Citrin, MD;  Location: [name];  Service: Neurosurgery;  Laterality: N/A;  3C   LAPAROSCOPIC GASTRIC SLEEVE RESECTION  09/26/2021   MYOMECTOMY N/A 02/03/2014   Procedure: MYOMECTOMY ;  Surgeon: [facility] Nails, MD;  Location: [name];  Service: Gynecology;  Laterality: N/A;   SALPINGECTOMY  2016    TONSILLECTOMY     age 46   TUBAL LIGATION N/A 08/19/2015   Procedure: POST PARTUM TUBAL LIGATION;  Surgeon: Osborn Coho, MD;  Location: [name];  Service: Gynecology;  Laterality: N/A;   UPPER GI [name] N/A 09/26/2021   Procedure: UPPER GI [name];  Surgeon: [facility] Ore, MD;  [facility]: [name];  Service: General;  Laterality: N/A;   WISDOM TOOTH EXTRACTION     age 75    Family History  Problem Relation Age of Onset   Hypertension Mother    Diabetes Mother    Mental illness Mother    Hyperlipidemia Mother    Stroke Mother    Depression Mother    Anxiety disorder Mother    Bipolar disorder Mother    Liver disease Mother    Sleep apnea Mother    Obesity Mother    Hypertension Father    Asthma Brother    Seizures Brother    Pancreatic cancer Maternal Aunt 58   Lung cancer Maternal Aunt        dx after 50; smoking  hx   Liver cancer Maternal Uncle        dx after 50   Breast cancer Paternal Aunt        two paternal aunts, age 10s-40s   Ovarian cancer Paternal Aunt        two paternal aunts, reported BRCA mutation   Colon polyps Paternal Uncle        two paternal uncles; more than 10 lifetime polyps   Heart disease Maternal Grandmother    Hypertension Maternal Grandmother    Breast cancer Maternal Grandmother        dx 10s-40s; d. 62   Ovarian cancer Paternal Grandmother    Ovarian cancer Other        PGM's mother   Breast cancer Cousin        dx 32s   Ovarian cancer Cousin        dx 67s    Social History   Socioeconomic History   Marital status: Legally Separated    Spouse name: Not on file   Number of children: Not on file   Years of education: Not on file   Highest education level: Not on file  Occupational History   Not on file  Tobacco Use   Smoking status: Never   Smokeless tobacco: Never  Vaping Use   Vaping status: Never Used  Substance and Sexual Activity   Alcohol use: Never   Drug use: No   Sexual activity: Yes    Birth  control/protection: None  Other Topics Concern   Not on file  Social History Narrative   Not on file   Social Determinants of Health   Financial Resource Strain: Not on file  Food Insecurity: Not on file  Transportation Needs: Not on file  Physical Activity: Not on file  Stress: Not on file  Social Connections: Not on file  Intimate Partner Violence: Not on file   ROS As per HPI  Objective   BP 118/85   Pulse 77   Temp 98 F (36.7 C)   Ht 5\' 4"  (1.626 m)   Wt 237 lb (107.5 kg)   SpO2 98%   BMI 40.68 kg/m   Physical Exam Constitutional:      General: She is awake. She is not in acute distress.    Appearance: Normal appearance. She is well-developed and well-groomed. She is morbidly obese. She is not ill-appearing, toxic-appearing [name] diaphoretic.  Cardiovascular:     Rate and Rhythm: Normal rate.     Pulses: Normal pulses.          Radial pulses are 2+ on the right side and 2+ on the left side.       Posterior tibial pulses are 2+ on the right side and 2+ on the left side.     Heart sounds: Normal heart sounds. No murmur heard.    No gallop.  Pulmonary:     Effort: Pulmonary effort is normal. No respiratory distress.     Breath sounds: Normal breath sounds. No stridor. No wheezing, rhonchi [name] rales.  Musculoskeletal:     Cervical back: Full passive range of motion without pain and neck supple.     Right lower leg: No edema.     Left lower leg: No edema.  Skin:    General: Skin is warm.     Capillary Refill: Capillary refill takes less than 2 seconds.  Neurological:     General: No focal deficit present.     Mental Status: She is  alert, oriented to person, place, and time and easily aroused. Mental status is at baseline.     GCS: GCS eye subscore is 4. GCS verbal subscore is 5. GCS motor subscore is 6.     Cranial Nerves: No cranial nerve deficit, dysarthria [name] facial asymmetry.     Motor: No weakness, tremor [name] pronator drift.     Coordination: Romberg sign  negative. Coordination normal. Finger-Nose-Finger Test normal. Rapid alternating movements normal.     Gait: Gait and tandem walk normal.  Psychiatric:        Attention and Perception: Attention and perception normal.        Mood and Affect: Mood and affect normal.        Speech: Speech normal.        Behavior: Behavior normal. Behavior is cooperative.        Thought Content: Thought content normal. Thought content does not include homicidal [name] suicidal ideation. Thought content does not include homicidal [name] suicidal plan.        Cognition and Memory: Cognition and memory normal.        Judgment: Judgment normal.       05/14/2023    9:22 AM 08/25/2021    2:32 PM 06/12/2021    3:09 PM  Depression screen PHQ 2/9  Decreased Interest 3 1 1   Down, Depressed, Hopeless 3 1 1   PHQ - 2 Score 6 2 2   Altered sleeping 3    Tired, decreased energy 3    Change in appetite 3    Feeling bad [name] failure about yourself  0    Trouble concentrating 3    Moving slowly [name] fidgety/restless 2    Suicidal thoughts 0    PHQ-9 Score 20    Difficult doing work/chores Extremely dIfficult        05/14/2023    9:22 AM  GAD 7 : Generalized Anxiety Score  Nervous, Anxious, on Edge 3  Control/stop worrying 3  Worry too much - different things 3  Trouble relaxing 3  Restless 1  Easily annoyed [name] irritable 3  Afraid - awful might happen 0  Total GAD 7 Score 16  Anxiety Difficulty Extremely difficult   Assessment & Plan:  1. Vitamin D deficiency Labs as below. Will communicate results to patient once available. Will await results to determine next steps.  - VITAMIN D 25 Hydroxy (Vit-D Deficiency, Fractures)  2. Severe episode of recurrent major depressive disorder, without psychotic features (HCC) Denies SI. Safety contract established. Patient is established with [facility] and has appt later today.   3. Morbid obesity (HCC) Labs as below. Will communicate results to patient once available. Will await  results to determine next steps.  Patient is established with Bariatric Surgery and Nutrition. Patient to schedule follow up.  - Magnesium  4. Memory change Labs as below. Will communicate results to patient once available. Will await results to determine next steps.  Discussed with patient to follow up with neurosurgery and psychiatry.  - Anemia Profile B - CMP14+EGFR - Thyroid Panel With TSH - Magnesium - Ambulatory referral to Neurosurgery  5. Encounter for screening for cardiovascular disorders Labs as below. Will communicate results to patient once available. Will await results to determine next steps.  Fasting  - Lipid panel  6. Attention deficit disorder (ADD) without hyperactivity Patient to follow up with Tressie Ellis, psychiatry. Patient is interested in restarting Vyvanse [name] other possible medication to treat ADD. Reviewed note from Cardiology on 08/30/21, Jodelle Red,  MD. Per note, "she wishes to continue her vyvanse. With normal echo and current monitor findings, this is acceptable". Vital signs within normal limits today.   7. S/P bariatric surgery Patient is established with nutrition and bariatrics. Patient to schedule follow up.   8. Ependymoma of central nervous system [place] [name]) Patient is established with oncology and neurosurgery. Patient to schedule follow up.   The above assessment and management plan was discussed with the patient. The patient verbalized understanding of and has agreed to the management plan using shared-decision making. Patient is aware to call the clinic if they develop any new symptoms [name] if symptoms fail to improve [name] worsen. Patient is aware when to return to the clinic for a follow-up visit. Patient educated on when it is appropriate to go to the emergency department.   Return in about 2 months (around 07/15/2023) for Chronic Condition Follow up.   Neale Burly, DNP-FNP [facility] [address] [place], [place] [address] [phone]

## 2023-05-15 LAB — CMP14+EGFR
ALT: 16 IU/L (ref 0–32)
Albumin: 4 g/dL (ref 3.9–4.9)
Alkaline Phosphatase: 100 IU/L (ref 44–121)
BUN/Creatinine Ratio: 10 (ref 9–23)
BUN: 9 mg/dL (ref 6–24)
Bilirubin Total: 0.3 mg/dL (ref 0.0–1.2)
CO2: 25 mmol/L (ref 20–29)
Calcium: 9.7 mg/dL (ref 8.7–10.2)
Globulin, Total: 2.1 g/dL (ref 1.5–4.5)
Glucose: 81 mg/dL (ref 70–99)
Potassium: 4.2 mmol/L (ref 3.5–5.2)
Sodium: 142 mmol/L (ref 134–144)

## 2023-05-15 LAB — ANEMIA PROFILE B
Basos: 1 %
EOS (ABSOLUTE): 0.2 10*3/uL (ref 0.0–0.4)
Eos: 3 %
Ferritin: 29 ng/mL (ref 15–150)
Hematocrit: 38.2 % (ref 34.0–46.6)
Hemoglobin: 12.4 g/dL (ref 11.1–15.9)
Immature Grans (Abs): 0 10*3/uL (ref 0.0–0.1)
Iron Saturation: 18 % (ref 15–55)
Iron: 66 ug/dL (ref 27–159)
MCHC: 32.5 g/dL (ref 31.5–35.7)
MCV: 84 fL (ref 79–97)
Monocytes: 9 %
Neutrophils Absolute: 2.1 10*3/uL (ref 1.4–7.0)
RBC: 4.56 x10E6/uL (ref 3.77–5.28)
RDW: 13.2 % (ref 11.7–15.4)
Total Iron Binding Capacity: 370 ug/dL (ref 250–450)
Vitamin B-12: 436 pg/mL (ref 232–1245)
WBC: 4.8 10*3/uL (ref 3.4–10.8)

## 2023-05-15 LAB — THYROID PANEL WITH TSH
Free Thyroxine Index: 2.1 (ref 1.2–4.9)
T4, Total: 7.6 ug/dL (ref 4.5–12.0)
TSH: 1.87 u[IU]/mL (ref 0.450–4.500)

## 2023-05-15 LAB — VITAMIN D 25 HYDROXY (VIT D DEFICIENCY, FRACTURES): Vit D, 25-Hydroxy: 34.3 ng/mL (ref 30.0–100.0)

## 2023-05-15 LAB — LIPID PANEL
Cholesterol, Total: 198 mg/dL (ref 100–199)
LDL Chol Calc (NIH): 128 mg/dL — ABNORMAL HIGH (ref 0–99)

## 2023-05-15 LAB — MAGNESIUM: Magnesium: 2.1 mg/dL (ref 1.6–2.3)

## 2023-05-15 NOTE — Progress Notes (Signed)
LDL cholesterol is slightly elevated. Diet encouraged - increase intake of fresh fruits and vegetables, increase intake of lean proteins. Bake, broil, or grill foods. Avoid fried, greasy, and fatty foods. Avoid fast foods. Increase intake of fiber-rich whole grains. Exercise encouraged - at least 150 minutes per week and advance as tolerated. Can also try red yeast rice and we will recheck in 3 months.  All other labs normal.

## 2023-07-04 ENCOUNTER — Inpatient Hospital Stay
Admission: RE | Admit: 2023-07-04 | Payer: BC Managed Care – PPO | Source: Home / Self Care | Admitting: Obstetrics and Gynecology

## 2023-07-10 ENCOUNTER — Emergency Department (HOSPITAL_COMMUNITY)
Admission: EM | Admit: 2023-07-10 | Discharge: 2023-07-10 | Disposition: A | Discharge status: Home / Self Care | Payer: BC Managed Care – PPO | Attending: Emergency Medicine | Admitting: Emergency Medicine

## 2023-07-10 ENCOUNTER — Other Ambulatory Visit: Payer: Self-pay

## 2023-07-10 ENCOUNTER — Encounter (HOSPITAL_COMMUNITY): Payer: Self-pay | Admitting: *Deleted

## 2023-07-10 DIAGNOSIS — I10 Essential (primary) hypertension: Secondary | ICD-10-CM | POA: Insufficient documentation

## 2023-07-10 DIAGNOSIS — J45909 Unspecified asthma, uncomplicated: Secondary | ICD-10-CM | POA: Insufficient documentation

## 2023-07-10 DIAGNOSIS — Z79899 Other long term (current) drug therapy: Secondary | ICD-10-CM | POA: Diagnosis not present

## 2023-07-10 DIAGNOSIS — N3000 Acute cystitis without hematuria: Secondary | ICD-10-CM | POA: Insufficient documentation

## 2023-07-10 DIAGNOSIS — Z91040 Latex allergy status: Secondary | ICD-10-CM | POA: Diagnosis not present

## 2023-07-10 DIAGNOSIS — H9201 Otalgia, right ear: Secondary | ICD-10-CM | POA: Diagnosis not present

## 2023-07-10 DIAGNOSIS — M549 Dorsalgia, unspecified: Secondary | ICD-10-CM | POA: Diagnosis present

## 2023-07-10 DIAGNOSIS — M62830 Muscle spasm of back: Secondary | ICD-10-CM | POA: Insufficient documentation

## 2023-07-10 DIAGNOSIS — Z7951 Long term (current) use of inhaled steroids: Secondary | ICD-10-CM | POA: Insufficient documentation

## 2023-07-10 LAB — URINALYSIS, ROUTINE W REFLEX MICROSCOPIC
Bilirubin Urine: NEGATIVE
Glucose, UA: NEGATIVE mg/dL
Hgb urine dipstick: NEGATIVE
Ketones, ur: NEGATIVE mg/dL
Nitrite: NEGATIVE
Protein, ur: NEGATIVE mg/dL
Specific Gravity, Urine: 1.017 (ref 1.005–1.030)
pH: 6 (ref 5.0–8.0)

## 2023-07-10 MED ORDER — CYCLOBENZAPRINE HCL 10 MG PO TABS: 10.0000 mg | ORAL_TABLET | Freq: Two times a day (BID) | ORAL | 0 refills | Status: DC | PRN

## 2023-07-10 MED ORDER — PHENAZOPYRIDINE HCL 200 MG PO TABS: 200.0000 mg | ORAL_TABLET | Freq: Three times a day (TID) | ORAL | 0 refills | Status: DC

## 2023-07-10 MED ORDER — NITROFURANTOIN MONOHYD MACRO 100 MG PO CAPS
100.0000 mg | ORAL_CAPSULE | Freq: Two times a day (BID) | ORAL | 0 refills | Status: DC
Start: 2023-07-10 — End: 2023-10-30

## 2023-07-10 NOTE — ED Provider Notes (Signed)
EMERGENCY DEPARTMENT AT [facility] Provider Note   CSN: [id number] Arrival date & time: 07/10/23  1252     History  Chief Complaint  Patient presents with   Back Pain    [name] is a 42 y.o. female with past medical history significant for hypertension, asthma, chronic back pain, prior back surgery to remove paraganglioma presents to the ED complaining of worsening back pain over the past 3 days.  Patient states she has been taking Flexeril, Tylenol, ibuprofen and a single dose of oxycodone without relief.  She has not noticed any urinary symptoms.  Patient also complaining of pain in her right ear that has been going on for a few days.  Denies bowel or bladder incontinence, weakness, numbness, trauma, fever, abdominal pain, nausea, vomiting, diarrhea, ear discharge, congestion.       Home Medications Prior to Admission medications   Medication Sig Start Date End Date Taking? Authorizing Provider  cyclobenzaprine (FLEXERIL) 10 MG tablet Take 1 tablet (10 mg total) by mouth 2 (two) times daily as needed for muscle spasms. 07/10/23  Yes Psalm Arman R, PA-C  nitrofurantoin, macrocrystal-monohydrate, (MACROBID) 100 MG capsule Take 1 capsule (100 mg total) by mouth 2 (two) times daily. 07/10/23  Yes Dodger Sinning R, PA-C  phenazopyridine (PYRIDIUM) 200 MG tablet Take 1 tablet (200 mg total) by mouth 3 (three) times daily. 07/10/23  Yes Valborg Friar R, PA-C  albuterol (PROVENTIL) (5 MG/ML) 0.5% nebulizer solution Take 0.5 mLs (2.5 mg total) by nebulization every 6 (six) hours as needed for wheezing or shortness of breath. 10/13/22   Edwin Dada P, DO  albuterol (VENTOLIN HFA) 108 (90 Base) MCG/ACT inhaler Inhale 1-2 puffs into the lungs every 6 (six) hours as needed for wheezing or shortness of breath.    [provider]  budesonide-formoterol (SYMBICORT) 80-4.5 MCG/ACT inhaler Inhale 2 puffs into the lungs in the morning and at bedtime. 10/13/22   Franne Forts, DO  DULoxetine (CYMBALTA) 60 MG capsule Take 60 mg by mouth at bedtime. 03/07/21   [provider]  Multiple Vitamins-Minerals (CELEBRATE MULTI-COMPLETE 18) CHEW Chew 1 tablet by mouth daily.    [provider]  MYFEMBREE 40-1-0.5 MG TABS Take 1 tablet by mouth daily. 04/19/23   [provider]  pantoprazole (PROTONIX) 40 MG tablet Take 40 mg by mouth daily. 11/13/22   [provider]  propranolol (INDERAL) 20 MG tablet Take 1 tablet by mouth twice daily Patient taking differently: Take 40 mg by mouth at bedtime. 12/05/21   Henreitta Leber, MD  zolpidem (AMBIEN) 10 MG tablet Take 10 mg by mouth at bedtime. 12/05/21   [provider]      Allergies    Ceftriaxone, Diclofenac, Fluconazole, Pineapple, Latex, and Bactrim [sulfamethoxazole-trimethoprim]    Review of Systems   Review of Systems  Constitutional:  Negative for fever.  HENT:  Positive for ear pain. Negative for congestion and ear discharge.   Gastrointestinal:  Negative for abdominal pain, diarrhea, nausea and vomiting.  Genitourinary:  Negative for decreased urine volume, dysuria, frequency, hematuria and urgency.  Musculoskeletal:  Positive for back pain.  Neurological:  Negative for weakness and numbness.    Physical Exam Updated Vital Signs BP (!) 131/97 (BP Location: Right Arm)   Pulse 76   Temp 98.8 F (37.1 C) (Oral)   Resp 20   Ht 5' 4.5" (1.638 m)   Wt 102.1 kg   SpO2 100%   BMI 38.02 kg/m  Physical Exam Vitals and nursing note reviewed.  Constitutional:      General: She is not in acute distress.    Appearance: Normal appearance. She is not ill-appearing or diaphoretic.  HENT:     Right Ear: Tympanic membrane normal. No drainage or swelling.     Left Ear: Tympanic membrane and ear canal normal.     Ears:     Comments: Very mild right EAC erythema without swelling or discharge.   Cardiovascular:     Rate and Rhythm: Normal rate and regular rhythm.   Pulmonary:     Effort: Pulmonary effort is normal.  Abdominal:     General: Abdomen is flat.     Palpations: Abdomen is soft.     Tenderness: There is abdominal tenderness in the suprapubic area. There is no right CVA tenderness or left CVA tenderness.  Musculoskeletal:     Lumbar back: Spasms and tenderness present. No swelling or bony tenderness. Normal range of motion.       Back:  Skin:    General: Skin is warm and dry.     Capillary Refill: Capillary refill takes less than 2 seconds.  Neurological:     Mental Status: She is alert. Mental status is at baseline.  Psychiatric:        Mood and Affect: Mood normal.        Behavior: Behavior normal.     ED Results / Procedures / Treatments   Labs (all labs ordered are listed, but only abnormal results are displayed) Labs Reviewed  URINALYSIS, ROUTINE W REFLEX MICROSCOPIC - Abnormal; Notable for the following components:      Result Value   APPearance HAZY (*)    Leukocytes,Ua LARGE (*)    Bacteria, UA MANY (*)    All other components within normal limits    EKG None  Radiology No results found.  Procedures Procedures    Medications Ordered in ED Medications - No data to display  ED Course/ Medical Decision Making/ A&P                                 Medical Decision Making Amount and/or Complexity of Data Reviewed Labs: ordered.   This patient presents to the ED with chief complaint(s) of worsening back pain with pertinent past medical history of back surgery.  The complaint involves an extensive differential diagnosis and also carries with it a high risk of complications and morbidity.    The differential diagnosis includes acute cystitis, muscle spasm, degenerative disc disease, acute on chronic back pain   The initial plan is to obtain UA  Initial Assessment:   On exam, patient is sitting upright in a chair and does not appear to be in acute distress.  Abdomen is soft with suprapubic tenderness.   Tenderness and spasms appreciated to left lumbar paraspinal region.  No midline tenderness.  No CVA tenderness.  Right EAC with very mild erythema, no swelling or drainage.  Bilateral TMs unremarkable.   Independent ECG/labs interpretation:  The following labs were independently interpreted:  UA with evidence of infection.  Disposition:   Patient's worsening back pain likely secondary to acute cystitis.  Will treat with Macrobid and Pyridium.  Patient also with muscle spasms, will send prescription in for Flexeril.  Recommended PCP or spine specialist follow up if back pain persists.  Patient without evidence of otitis externa, do not feel that otic antibiotics are required  at this time.    The patient has been appropriately medically screened and/or stabilized in the ED. I have low suspicion for any other emergent medical condition which would require further screening, evaluation or treatment in the ED or require inpatient management. At time of discharge the patient is hemodynamically stable and in no acute distress. I have discussed work-up results and diagnosis with patient and answered all questions. Patient is agreeable with discharge plan. We discussed strict return precautions for returning to the emergency department and they verbalized understanding.           Final Clinical Impression(s) / ED Diagnoses Final diagnoses:  Acute cystitis without hematuria  Muscle spasm of back  Right ear pain    Rx / DC Orders ED Discharge Orders          Ordered    cyclobenzaprine (FLEXERIL) 10 MG tablet  2 times daily PRN        07/10/23 1812    nitrofurantoin, macrocrystal-monohydrate, (MACROBID) 100 MG capsule  2 times daily        07/10/23 1812    phenazopyridine (PYRIDIUM) 200 MG tablet  3 times daily        07/10/23 1812              Lenard Simmer, PA-C 07/10/23 Harrel Lemon, MD 07/15/23 Rickey Primus

## 2023-07-10 NOTE — Discharge Instructions (Addendum)
Thank you for allowing [place] to be a part of your care today.  You were evaluated in the ED for worsening back pain.  Your urine is positive for infection.  An antibiotic has been sent to the pharmacy to treat this.  I have also sent in Pyridium to help with bladder and urethra irritation.   I have sent in Flexeril to treat muscle spasms.  I recommend following up with your PCP or spine specialist if you continue to have worsening back pain despite treating the UTI.   Return to the ED if you develop sudden worsening of your symptoms or if you have any new concerns.

## 2023-07-10 NOTE — ED Triage Notes (Signed)
Pt with lower back pain x 3 days.  Back surgery about a year ago. Denies any injuries or falls. Tried flexeril and percocet without relief.

## 2023-07-16 ENCOUNTER — Encounter: Payer: Self-pay | Admitting: Family Medicine

## 2023-07-16 ENCOUNTER — Ambulatory Visit: Payer: BC Managed Care – PPO | Admitting: Family Medicine

## 2023-07-16 VITALS — BP 114/79 | HR 81 | Temp 98.0°F | Ht 64.5 in | Wt 246.0 lb

## 2023-07-16 DIAGNOSIS — F331 Major depressive disorder, recurrent, moderate: Secondary | ICD-10-CM

## 2023-07-16 DIAGNOSIS — Z9884 Bariatric surgery status: Secondary | ICD-10-CM

## 2023-07-16 DIAGNOSIS — R300 Dysuria: Secondary | ICD-10-CM

## 2023-07-16 DIAGNOSIS — M7989 Other specified soft tissue disorders: Secondary | ICD-10-CM

## 2023-07-16 DIAGNOSIS — Z6841 Body Mass Index (BMI) 40.0 and over, adult: Secondary | ICD-10-CM

## 2023-07-16 LAB — URINALYSIS, ROUTINE W REFLEX MICROSCOPIC
Bilirubin, UA: NEGATIVE
Glucose, UA: NEGATIVE
Ketones, UA: NEGATIVE
Nitrite, UA: NEGATIVE
Protein,UA: NEGATIVE
RBC, UA: NEGATIVE
Specific Gravity, UA: 1.02 (ref 1.005–1.030)
Urobilinogen, Ur: 0.2 mg/dL (ref 0.2–1.0)
pH, UA: 5.5 (ref 5.0–7.5)

## 2023-07-16 LAB — MICROSCOPIC EXAMINATION
RBC, Urine: NONE SEEN /hpf (ref 0–2)
Renal Epithel, UA: NONE SEEN /hpf

## 2023-07-16 LAB — WET PREP FOR TRICH, YEAST, CLUE
Clue Cell Exam: NEGATIVE
Trichomonas Exam: NEGATIVE
Yeast Exam: POSITIVE — AB

## 2023-07-16 MED ORDER — CLOTRIMAZOLE 3 2 % VA CREA
1.0000 | TOPICAL_CREAM | Freq: Every day | VAGINAL | 0 refills | Status: AC
Start: 2023-07-16 — End: 2023-07-19

## 2023-07-16 NOTE — Addendum Note (Signed)
Addended by: Neale Burly on: 07/16/2023 05:17 PM   Modules accepted: Orders

## 2023-07-16 NOTE — Progress Notes (Addendum)
Subjective:  Patient ID: Shannon [name], female    DOB: 06/07/1981, 42 y.o.   MRN: [id number]  Patient Care Team: Arrie Senate, FNP as PCP - General (Family Medicine) Jodelle Red, MD as PCP - Cardiology (Cardiology) Stechschulte, Hyman Hopes, MD as Consulting Physician (Surgery)   Chief Complaint:  Medical Management of Chronic Issues (2 month follow up/ED visit - UTI/Would like labs to check to see if it has cleared up)   HPI: Shannon [name] is a 42 y.o. female presenting on 07/16/2023 for Medical Management of Chronic Issues (2 month follow up/ED visit - UTI/Would like labs to check to see if it has cleared up)  HPI 1. Dysuria States that she went to ED a few weeks ago and would like to repeat her UA States that she continues to have low back pain. States that her back was in a lot of pain. States that they did not complete imaging. Denies fevers, n/v.   2. Swelling in hands and feet  States that it will just come on and stay 1-2 days. The most was one week.  Endorses tingling in her fingers and a headache. Denies pain, not at the joints.  States that eventually hurts with walking.  Does not know of anything that makes it better.  First noticed it 6 months ago.  States that it happens twice per month.  States that it is occurring more often now.   Relevant past medical, surgical, family, and social history reviewed and updated as indicated.  Allergies and medications reviewed and updated. Data reviewed: Chart in Epic.   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;  Service: Gynecology;  Laterality: N/A;   DILATATION & CURRETTAGE/HYSTEROSCOPY WITH RESECTOCOPE N/A 02/03/2014   Procedure: DILATATION & CURETTAGE/HYSTEROSCOPY ;  Surgeon: Purcell Nails, MD;  Location: [facility];  Service: Gynecology;  Laterality: N/A;   DILATION AND CURETTAGE OF UTERUS  2001   ENDOMETRIAL ABLATION N/A 10/12/2021   Procedure: ENDOMETRIAL ABLATION;  Surgeon: Osborn Coho, MD;  Location: [facility];  Service: Gynecology;  Laterality: N/A;   ESOPHAGOGASTRODUODENOSCOPY N/A 07/25/2021   Procedure: ESOPHAGOGASTRODUODENOSCOPY (EGD);  Surgeon: Quentin Ore, MD;  Location: [facility];  Service: General;  Laterality: N/A;   LAMINECTOMY N/A 03/17/2021   Procedure: Laminectomy - Lumbar Four-Lumbar Five for Intradural mass;  Surgeon: Donalee Citrin, MD;  Location: [facility];  Service: Neurosurgery;  Laterality: N/A;  3C   LAPAROSCOPIC GASTRIC SLEEVE RESECTION  09/26/2021   MYOMECTOMY N/A 02/03/2014   Procedure: MYOMECTOMY ;  Surgeon: Purcell Nails, MD;  Location: [facility];  Service: Gynecology;  Laterality: N/A;   SALPINGECTOMY  2016   TONSILLECTOMY     age 41   TUBAL LIGATION N/A 08/19/2015   Procedure: POST PARTUM TUBAL LIGATION;  Surgeon: Osborn Coho, MD;  Location: [facility];  Service: Gynecology;  Laterality: N/A;   UPPER GI ENDOSCOPY N/A 09/26/2021   Procedure: UPPER GI ENDOSCOPY;  Surgeon: Quentin Ore, MD;  Location: [facility];   Service: General;  Laterality: N/A;   WISDOM TOOTH EXTRACTION     age 26    Social History   Socioeconomic History   Marital status: Legally Separated    Spouse name: Not on file   Number of children: Not on file   Years of education: Not on file   Highest education level: Not on file  Occupational History   Not on file  Tobacco Use   Smoking status: Never   Smokeless tobacco: Never  Vaping Use   Vaping status: Never Used  Substance and Sexual Activity   Alcohol use: Never   Drug use: No   Sexual activity: Yes    Birth control/protection: None  Other Topics Concern   Not on file  Social History Narrative   Not on file   Social Determinants of Health   Financial Resource Strain: Not on file  Food Insecurity: Not on file  Transportation Needs: Not on file  Physical Activity: Not on file  Stress: Not on file  Social Connections: Not on file  Intimate Partner Violence: Not on file    Outpatient Encounter Medications as of 07/16/2023  Medication Sig   albuterol (PROVENTIL) (5 MG/ML) 0.5% nebulizer solution Take 0.5 mLs (2.5 mg total) by nebulization every 6 (six) hours as needed for wheezing or shortness of breath.   albuterol (VENTOLIN HFA) 108 (90 Base) MCG/ACT inhaler Inhale 1-2 puffs into the lungs every 6 (six) hours as needed for wheezing or shortness of breath.   budesonide-formoterol (SYMBICORT) 80-4.5 MCG/ACT inhaler Inhale 2 puffs into the lungs in the morning and at bedtime.   cyclobenzaprine (FLEXERIL) 10 MG tablet Take 1 tablet (10 mg total) by mouth 2 (two) times daily as needed for muscle spasms.   DULoxetine (CYMBALTA) 60 MG capsule Take 60 mg by mouth at bedtime.   Multiple Vitamins-Minerals (CELEBRATE MULTI-COMPLETE 18) CHEW Chew 1 tablet by mouth daily.   MYFEMBREE 40-1-0.5 MG TABS Take 1 tablet by mouth daily.   nitrofurantoin, macrocrystal-monohydrate, (MACROBID) 100 MG capsule Take 1 capsule (100 mg total) by mouth 2 (two) times daily.   pantoprazole  (PROTONIX) 40 MG tablet Take 40 mg by mouth daily.   phenazopyridine (PYRIDIUM) 200 MG tablet Take 1 tablet (200 mg total) by mouth 3 (three) times daily.   propranolol (INDERAL) 20 MG tablet Take 1 tablet by mouth twice daily (Patient taking differently: Take 40 mg by mouth at bedtime.)   zolpidem (AMBIEN) 10 MG tablet Take 10 mg by mouth at bedtime.   No facility-administered encounter medications on file as of 07/16/2023.    Allergies  Allergen Reactions   Ceftriaxone Hives and Rash    No SOB - patient willing to try again   Diclofenac Rash   Fluconazole Hives    Raw rash on abdomen and she had sores and swelling in mouth     Pineapple Shortness Of Breath  Latex Rash   Bactrim [Sulfamethoxazole-Trimethoprim] Hives    Review of Systems As per HPI  Objective:  BP 114/79   Pulse 81   Temp 98 F (36.7 C)   Ht 5' 4.5" (1.638 m)   Wt 246 lb (111.6 kg)   SpO2 98%   BMI 41.57 kg/m    Wt Readings from Last 3 Encounters:  07/16/23 246 lb (111.6 kg)  07/10/23 225 lb (102.1 kg)  05/14/23 237 lb (107.5 kg)   Physical Exam Constitutional:      General: She is awake. She is not in acute distress.    Appearance: Normal appearance. She is well-developed and well-groomed. She is morbidly obese. She is not ill-appearing, toxic-appearing or diaphoretic.  Cardiovascular:     Rate and Rhythm: Normal rate and regular rhythm.     Pulses: Normal pulses.          Radial pulses are 2+ on the right side and 2+ on the left side.       Posterior tibial pulses are 2+ on the right side and 2+ on the left side.     Heart sounds: Normal heart sounds. No murmur heard.    No gallop.  Pulmonary:     Effort: Pulmonary effort is normal. No respiratory distress.     Breath sounds: Normal breath sounds. No stridor. No wheezing, rhonchi or rales.  Musculoskeletal:     Cervical back: Full passive range of motion without pain and neck supple.     Right lower leg: No edema.     Left lower leg: No  edema.     Comments: No swelling in hands, wrists, feet, or ankles on exam today   Skin:    General: Skin is warm.     Capillary Refill: Capillary refill takes less than 2 seconds.  Neurological:     General: No focal deficit present.     Mental Status: She is alert, oriented to person, place, and time and easily aroused. Mental status is at baseline.     GCS: GCS eye subscore is 4. GCS verbal subscore is 5. GCS motor subscore is 6.     Motor: No weakness.  Psychiatric:        Attention and Perception: Attention and perception normal.        Mood and Affect: Mood and affect normal.        Speech: Speech normal.        Behavior: Behavior normal. Behavior is cooperative.        Thought Content: Thought content normal. Thought content does not include homicidal or suicidal ideation. Thought content does not include homicidal or suicidal plan.        Cognition and Memory: Cognition and memory normal.        Judgment: Judgment normal.     Results for orders placed or performed during the hospital encounter of 07/10/23  Urinalysis, Routine w reflex microscopic -Urine, Clean Catch  Result Value Ref Range   Color, Urine YELLOW YELLOW   APPearance HAZY (A) CLEAR   Specific Gravity, Urine 1.017 1.005 - 1.030   pH 6.0 5.0 - 8.0   Glucose, UA NEGATIVE NEGATIVE mg/dL   Hgb urine dipstick NEGATIVE NEGATIVE   Bilirubin Urine NEGATIVE NEGATIVE   Ketones, ur NEGATIVE NEGATIVE mg/dL   Protein, ur NEGATIVE NEGATIVE mg/dL   Nitrite NEGATIVE NEGATIVE   Leukocytes,Ua LARGE (A) NEGATIVE   RBC / HPF 0-5 0 - 5 RBC/hpf   WBC, UA 21-50 0 -  5 WBC/hpf   Bacteria, UA MANY (A) NONE SEEN   Squamous Epithelial / HPF 6-10 0 - 5 /HPF   Mucus PRESENT        07/16/2023    8:55 AM 05/14/2023    9:22 AM 08/25/2021    2:32 PM 06/12/2021    3:09 PM 04/14/2021    1:12 PM  Depression screen PHQ 2/9  Decreased Interest 2 3 1 1 1   Down, Depressed, Hopeless 1 3 1 1  0  PHQ - 2 Score 3 6 2 2 1   Altered sleeping 2 3    1   Tired, decreased energy 2 3   2   Change in appetite 2 3   1   Feeling bad or failure about yourself  0 0   0  Trouble concentrating 3 3   1   Moving slowly or fidgety/restless 2 2   1   Suicidal thoughts 0 0   0  PHQ-9 Score 14 20   7   Difficult doing work/chores Very difficult Extremely dIfficult   Very difficult       07/16/2023    8:55 AM 05/14/2023    9:22 AM  GAD 7 : Generalized Anxiety Score  Nervous, Anxious, on Edge 2 3  Control/stop worrying 0 3  Worry too much - different things 1 3  Trouble relaxing 1 3  Restless 1 1  Easily annoyed or irritable 1 3  Afraid - awful might happen 0 0  Total GAD 7 Score 6 16  Anxiety Difficulty Very difficult Extremely difficult   Pertinent labs & imaging results that were available during my care of the patient were reviewed by me and considered in my medical decision making.  Assessment & Plan:  Dekota was seen today for medical management of chronic issues.  Diagnoses and all orders for this visit:  Dysuria Labs as below. Will communicate results to patient once available. Will await results to determine next steps.  Discussed results of UA with patient. Will wait until results of Wet prep and culture are back to treat.  Wet prep positive for yeast. Discussed with pharmacy medication options due to patient allergies. Will start topical medication as below. -     Urinalysis, Routine w reflex microscopic -     Urine Culture -     WET PREP FOR TRICH, YEAST, CLUE - clotrimazole (CLOTRIMAZOLE 3) 2 % vaginal cream; Place 1 Applicatorful vaginally at bedtime for 3 days.  Dispense: 21 g; Refill: 0  Moderate episode of recurrent major depressive disorder [facility]) Reviewed PHQ-9 results with patient. Stable. Denies SI. Safety contract established. Established with psychiatry. Patient to continue to follow up with psychiatry.   Morbid obesity with BMI of 40.0-44.9, adult (HCC) Established with weight loss clinic and nutrition. Patient to  schedule follow up.   S/P bariatric surgery As above   Bilateral hand swelling Discussed with patient various etiologies such as diet and lymphedema. Reviewed labs from 04/2023. All labs normal at that time (Vitamin D, Vitamin B12, TSH, anemia profile, GFR). No protein in urine today on UA above. Labs as below. Will communicate results to patient once available. Will await results to determine next steps.  -     ANA Comprehensive Panel -     Sedimentation Rate -     C-reactive protein  Bilateral swelling of feet As above.  -     ANA Comprehensive Panel -     Sedimentation Rate -     C-reactive protein   Continue  all other maintenance medications.  Follow up plan: Return in about 3 months (around 10/15/2023) for Chronic Condition Follow up.  Continue healthy lifestyle choices, including diet (rich in fruits, vegetables, and lean proteins, and low in salt and simple carbohydrates) and exercise (at least 30 minutes of moderate physical activity daily).  Written and verbal instructions provided   The above assessment and management plan was discussed with the patient. The patient verbalized understanding of and has agreed to the management plan. Patient is aware to call the clinic if they develop any new symptoms or if symptoms persist or worsen. Patient is aware when to return to the clinic for a follow-up visit. Patient educated on when it is appropriate to go to the emergency department.   Neale Burly, DNP-FNP [facility] [address] [place], [place] [address] [phone]

## 2023-07-16 NOTE — Progress Notes (Signed)
Yeast present on wet prep. Discussed with pharmacy best option for patient to take given allergy to diflucan. Sent in clotrimazole as recommended by UpToDate guidelines. Patient to use vaginal applicator for 3 days. If she is concerned for hives, she may take OTC benadryl prior to each dose.  Please notify provider if any reaction occurs.

## 2023-07-17 LAB — ANA COMPREHENSIVE PANEL
Anti JO-1: <0.2 AI (ref 0.0–0.9)
Centromere Ab Screen: <0.2 AI (ref 0.0–0.9)
Chromatin Ab SerPl-aCnc: <0.2 AI (ref 0.0–0.9)
ENA RNP Ab: <0.2 AI (ref 0.0–0.9)
ENA SM Ab Ser-aCnc: <0.2 AI (ref 0.0–0.9)
ENA SSA (RO) Ab: <0.2 AI (ref 0.0–0.9)
ENA SSB (LA) Ab: <0.2 AI (ref 0.0–0.9)
Scleroderma (Scl-70) (ENA) Antibody, IgG: <0.2 AI (ref 0.0–0.9)
dsDNA Ab: <1 IU/mL (ref 0–9)

## 2023-07-17 LAB — C-REACTIVE PROTEIN: CRP: <1 mg/L (ref 0–10)

## 2023-07-17 LAB — SEDIMENTATION RATE: Sed Rate: 9 mm/hr (ref 0–32)

## 2023-07-18 LAB — URINE CULTURE

## 2023-07-18 NOTE — Progress Notes (Signed)
Negative for UTI on culture. Continue clotrimazole and follow up if symptoms continue.

## 2023-07-22 ENCOUNTER — Encounter: Admission: RE | Payer: Self-pay | Source: Home / Self Care

## 2023-07-22 SURGERY — HYSTERECTOMY, ABDOMINAL
Anesthesia: General

## 2023-09-12 ENCOUNTER — Other Ambulatory Visit (HOSPITAL_COMMUNITY): Payer: Self-pay

## 2023-10-02 DIAGNOSIS — B009 Herpesviral infection, unspecified: Secondary | ICD-10-CM | POA: Insufficient documentation

## 2023-10-02 DIAGNOSIS — A6000 Herpesviral infection of urogenital system, unspecified: Secondary | ICD-10-CM | POA: Insufficient documentation

## 2023-10-11 ENCOUNTER — Ambulatory Visit: Payer: BC Managed Care – PPO | Admitting: Family Medicine

## 2023-10-30 ENCOUNTER — Encounter: Payer: Self-pay | Admitting: Family Medicine

## 2023-10-30 ENCOUNTER — Ambulatory Visit (INDEPENDENT_AMBULATORY_CARE_PROVIDER_SITE_OTHER): Payer: 59 | Admitting: Family Medicine

## 2023-10-30 VITALS — BP 125/85 | HR 75 | Temp 98.0°F | Ht 64.5 in | Wt 229.0 lb

## 2023-10-30 DIAGNOSIS — E7800 Pure hypercholesterolemia, unspecified: Secondary | ICD-10-CM

## 2023-10-30 DIAGNOSIS — Z0001 Encounter for general adult medical examination with abnormal findings: Secondary | ICD-10-CM

## 2023-10-30 DIAGNOSIS — Z9884 Bariatric surgery status: Secondary | ICD-10-CM

## 2023-10-30 DIAGNOSIS — Z800 Family history of malignant neoplasm of digestive organs: Secondary | ICD-10-CM

## 2023-10-30 DIAGNOSIS — Z803 Family history of malignant neoplasm of breast: Secondary | ICD-10-CM | POA: Diagnosis not present

## 2023-10-30 DIAGNOSIS — N898 Other specified noninflammatory disorders of vagina: Secondary | ICD-10-CM

## 2023-10-30 DIAGNOSIS — F332 Major depressive disorder, recurrent severe without psychotic features: Secondary | ICD-10-CM

## 2023-10-30 DIAGNOSIS — Z6838 Body mass index (BMI) 38.0-38.9, adult: Secondary | ICD-10-CM

## 2023-10-30 DIAGNOSIS — E559 Vitamin D deficiency, unspecified: Secondary | ICD-10-CM

## 2023-10-30 DIAGNOSIS — Z0000 Encounter for general adult medical examination without abnormal findings: Secondary | ICD-10-CM

## 2023-10-30 DIAGNOSIS — E66812 Obesity, class 2: Secondary | ICD-10-CM

## 2023-10-30 LAB — URINALYSIS, ROUTINE W REFLEX MICROSCOPIC
Bilirubin, UA: NEGATIVE
Glucose, UA: NEGATIVE
Nitrite, UA: POSITIVE — AB
RBC, UA: NEGATIVE
Specific Gravity, UA: 1.025 (ref 1.005–1.030)
Urobilinogen, Ur: 0.2 mg/dL (ref 0.2–1.0)
pH, UA: 6 (ref 5.0–7.5)

## 2023-10-30 LAB — MICROSCOPIC EXAMINATION
RBC, Urine: NONE SEEN /[HPF] (ref 0–2)
Renal Epithel, UA: NONE SEEN /[HPF]
WBC, UA: >30 /[HPF] — AB (ref 0–5)
Yeast, UA: NONE SEEN

## 2023-10-30 MED ORDER — NITROFURANTOIN MONOHYD MACRO 100 MG PO CAPS
100.0000 mg | ORAL_CAPSULE | Freq: Two times a day (BID) | ORAL | 0 refills | Status: AC
Start: 2023-10-30 — End: 2023-11-04

## 2023-10-30 NOTE — Progress Notes (Signed)
 Shannon [name] is a 43 y.o. female presents to office today for annual physical exam examination.    Concerns today include: 1. UTI  Endorses low back pain, increased frequency and odor. States that she saw Ob/gyn before Christmas  Endorses discharge, thick milky discharge.  States that she was tested for STI's at that time. But she would like STI testing as well. Reports that her relationship ended that she was in. Last sexual encounter was ~10/01/23. Only one partner in the last 60 days. Reports that she is following up with ob/gyn for urinary leakage.   Would like her kidney function checked as well has vitamin D .  Has history of Vitamin D  deficiency. She just started resuming vitamins since having her gastric sleeve.  States that she started [employer] once daily   Occupation: school counselor  Marital status: not currently  Diet: trying to lose weight. Protein shake in am, fruit and salad for dinner. Chicken and vegetable if not a salad.  Exercise: walking 2 days per week.  Substance use: none  Last eye exam: UTD, 09/2023, plans to follow up for adjustment of prescription  Last dental exam: UTD, 12/13/024 Last colonoscopy: father and brother diagnosed with colon cancer. Father in late 30s at diagnosis and brother is 80. Two paternal uncles also diagnosed with colon cancer <50 y/o.  Last mammogram: started at 43 years old due to high risk. Completes with OB/gyn. Paternal grandmother, paternal aunts x 3. States that they were diagnosed with breast and ovarian. Two were younger than 66. All three aunts and grandmother had recurrence after 50.  Last pap smear: due - plans to go to Ob Contraceptive: tubal ligation  Refills needed today: denies  Other specialists seen:  Did not follow up with neuro  Established with psychiatry, Sherril  She recently stopped counseling.  Dermatology exam: agreeable.  Fasting today:  no  Immunizations needed: Flu Vaccine: no  Tdap Vaccine: no  - every  22yrs - (<3 lifetime doses or unknown): all wounds -- look up need for Tetanus IG - (>=3 lifetime doses): clean/minor wound if >35yrs from previous; all other wounds if >46yrs from previous Zoster Vaccine: no (those >50yo, once) Pneumonia Vaccine: no (those w/ risk factors) - (<71yr) Both: Immunocompromised, cochlear implant, CSF leak, asplenic, sickle cell, Chronic Renal Failure - (<61yr) PPSV-23 only: Heart dz, lung disease, DM, tobacco abuse, alcoholism, cirrhosis/liver disease. - (>73yr): PPSV13 then PPSV23 in 6-12mths;  - (>38yr): repeat PPSV23 once if pt received prior to 43yo and 43yrs have passed   Past Medical History:  Diagnosis Date   Acid reflux 2022   ADD (attention deficit disorder)    Anemia 08/23/2021   on 09/27/21 HGB was 10.8 (Epic)   Anxiety    Asthma    Back pain    Depression    Family history of breast cancer 04/28/2021   Family history of ovarian cancer 04/28/2021   Family history of pancreatic cancer 04/28/2021   Hyperlipidemia    Hypertension    Preeclampsia post delivery   Multiple food allergies    pineapple and sometimes egg derivatives   Obesity    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   Vitamin D  deficiency    Wears contact lenses    Wears glasses    Social History   Socioeconomic History   Marital status: Divorced    Spouse name: Not on file   Number of children: Not on file   Years of education: Not on file  Highest education level: Not on file  Occupational History   Not on file  Tobacco Use   Smoking status: Never   Smokeless tobacco: Never  Vaping Use   Vaping status: Never Used  Substance and Sexual Activity   Alcohol use: Never   Drug use: No   Sexual activity: Yes    Birth control/protection: None  Other Topics Concern   Not on file  Social History Narrative   Not on file   Social Drivers of Health   Financial Resource Strain: Not on file  Food Insecurity: Not on file  Transportation Needs: Not on file  Physical  Activity: Not on file  Stress: Not on file  Social Connections: Not on file  Intimate Partner Violence: Not on file   Past Surgical History:  Procedure Laterality Date   BACK SURGERY     BIOPSY  07/25/2021   Procedure: BIOPSY;  Surgeon: Lyndel Deward PARAS, MD;  Location: [facility];  Service: General;;   DILATATION & CURETTAGE/HYSTEROSCOPY WITH MYOSURE N/A 10/12/2021   Procedure: DILATATION & CURETTAGE/HYSTEROSCOPY;  Surgeon: Henry Slough, MD;  Location: [facility];  Service: Gynecology;  Laterality: N/A;   DILATATION & CURRETTAGE/HYSTEROSCOPY WITH RESECTOCOPE N/A 02/03/2014   Procedure: DILATATION & CURETTAGE/HYSTEROSCOPY ;  Surgeon: Slough CINDERELLA Henry, MD;  Location: [facility];  Service: Gynecology;  Laterality: N/A;   DILATION AND CURETTAGE OF UTERUS  2001   ENDOMETRIAL ABLATION N/A 10/12/2021   Procedure: ENDOMETRIAL ABLATION;  Surgeon: Henry Slough, MD;  Location: [facility];  Service: Gynecology;  Laterality: N/A;   ESOPHAGOGASTRODUODENOSCOPY N/A 07/25/2021   Procedure: ESOPHAGOGASTRODUODENOSCOPY (EGD);  Surgeon: Lyndel Deward PARAS, MD;  Location: [facility];  Service: General;  Laterality: N/A;   LAMINECTOMY N/A 03/17/2021   Procedure: Laminectomy - Lumbar Four-Lumbar Five for Intradural mass;  Surgeon: Onetha Kuba, MD;  Location: [facility];  Service: Neurosurgery;  Laterality: N/A;  3C   LAPAROSCOPIC GASTRIC SLEEVE RESECTION  09/26/2021   MYOMECTOMY N/A 02/03/2014   Procedure: MYOMECTOMY ;  Surgeon: Slough CINDERELLA Henry, MD;  Location: [facility];  Service: Gynecology;  Laterality: N/A;   SALPINGECTOMY  2016   TONSILLECTOMY     age 85   TUBAL LIGATION N/A 08/19/2015   Procedure: POST PARTUM TUBAL LIGATION;  Surgeon: Slough Henry, MD;  Location: [facility];  Service: Gynecology;  Laterality: N/A;   UPPER GI ENDOSCOPY N/A 09/26/2021   Procedure: UPPER GI ENDOSCOPY;  Surgeon: Lyndel Deward PARAS, MD;  Location: [facility];  Service: General;  Laterality: N/A;    WISDOM TOOTH EXTRACTION     age 43   Family History  Problem Relation Age of Onset   Hypertension Mother    Diabetes Mother    Mental illness Mother    Hyperlipidemia Mother    Stroke Mother    Depression Mother    Anxiety disorder Mother    Bipolar disorder Mother    Liver disease Mother    Sleep apnea Mother    Obesity Mother    Hypertension Father    Asthma Brother    Seizures Brother    Pancreatic cancer Maternal Aunt 47   Lung cancer Maternal Aunt        dx after 50; smoking hx   Liver cancer Maternal Uncle        dx after 15   Breast cancer Paternal Aunt        two paternal aunts, age 45s-40s   Ovarian cancer Paternal Aunt  two paternal aunts, reported BRCA mutation   Colon polyps Paternal Uncle        two paternal uncles; more than 10 lifetime polyps   Heart disease Maternal Grandmother    Hypertension Maternal Grandmother    Breast cancer Maternal Grandmother        dx 79s-40s; d. 35   Ovarian cancer Paternal Grandmother    Ovarian cancer Other        PGM's mother   Breast cancer Cousin        dx 7s   Ovarian cancer Cousin        dx 6s    Current Outpatient Medications:    albuterol  (PROVENTIL ) (5 MG/ML) 0.5% nebulizer solution, Take 0.5 mLs (2.5 mg total) by nebulization every 6 (six) hours as needed for wheezing or shortness of breath., Disp: 20 mL, Rfl: 12   albuterol  (VENTOLIN  HFA) 108 (90 Base) MCG/ACT inhaler, Inhale 1-2 puffs into the lungs every 6 (six) hours as needed for wheezing or shortness of breath., Disp: , Rfl:    budesonide -formoterol  (SYMBICORT ) 80-4.5 MCG/ACT inhaler, Inhale 2 puffs into the lungs in the morning and at bedtime., Disp: 1 each, Rfl: 12   DULoxetine  (CYMBALTA ) 60 MG capsule, Take 60 mg by mouth at bedtime., Disp: , Rfl:    lisdexamfetamine (VYVANSE ) 50 MG capsule, Take 50 mg by mouth every morning., Disp: , Rfl:    MYFEMBREE 40-1-0.5 MG TABS, Take 1 tablet by mouth daily., Disp: , Rfl:    pantoprazole  (PROTONIX ) 40  MG tablet, Take 40 mg by mouth daily., Disp: , Rfl:    propranolol  (INDERAL ) 40 MG tablet, Take 1 tablet every day by oral route in the morning., Disp: , Rfl:    zolpidem  (AMBIEN ) 10 MG tablet, Take 10 mg by mouth at bedtime., Disp: , Rfl:   Allergies  Allergen Reactions   Ceftriaxone Hives and Rash    No SOB - patient willing to try again   Diclofenac Rash   Fluconazole  Hives    Raw rash on abdomen and she had sores and swelling in mouth     Pineapple Shortness Of Breath   Latex Rash   Bactrim  [Sulfamethoxazole -Trimethoprim ] Hives     ROS: Review of Systems ROS  As per HPI   Physical exam    10/30/2023    3:24 PM 07/16/2023    8:51 AM 07/10/2023    6:43 PM  Vitals with BMI  Height 5' 4.5 5' 4.5   Weight 229 lbs 246 lbs   BMI 38.71 41.59   Systolic 125 114 871  Diastolic 85 79 93  Pulse 75 81 84    Physical Exam Constitutional:      General: She is awake. She is not in acute distress.    Appearance: Normal appearance. She is well-developed and well-groomed. She is obese. She is not ill-appearing, toxic-appearing or diaphoretic.     Interventions: She is not intubated. HENT:     Head:     Salivary Glands: Right salivary gland is not diffusely enlarged or tender. Left salivary gland is not diffusely enlarged or tender.     Right Ear: No laceration, drainage, swelling or tenderness. No middle ear effusion. There is no impacted cerumen. No foreign body. No mastoid tenderness. No PE tube. No hemotympanum. Tympanic membrane is not injected, scarred, perforated, erythematous, retracted or bulging.     Left Ear: No laceration, drainage, swelling or tenderness.  No middle ear effusion. There is no impacted cerumen. No foreign body.  No mastoid tenderness. No PE tube. No hemotympanum. Tympanic membrane is not injected, scarred, perforated, erythematous, retracted or bulging.     Nose: No nasal deformity, septal deviation, signs of injury, laceration, nasal tenderness, mucosal edema,  congestion or rhinorrhea.     Right Sinus: No maxillary sinus tenderness or frontal sinus tenderness.     Left Sinus: No maxillary sinus tenderness or frontal sinus tenderness.  Eyes:     General: Lids are normal.     Extraocular Movements:     Right eye: Normal extraocular motion.     Left eye: Normal extraocular motion.     Conjunctiva/sclera:     Right eye: Right conjunctiva is not injected. No chemosis, exudate or hemorrhage.    Left eye: Left conjunctiva is not injected. No chemosis, exudate or hemorrhage.    Pupils: Pupils are equal, round, and reactive to light. Pupils are equal.     Right eye: Pupil is round, reactive and not sluggish. No corneal abrasion.     Left eye: Pupil is round, reactive and not sluggish. No corneal abrasion.     Funduscopic exam:    Right eye: No hemorrhage or exudate. Red reflex present.        Left eye: No hemorrhage or exudate. Red reflex present. Neck:     Thyroid : No thyroid  mass or thyromegaly.     Vascular: No carotid bruit.     Trachea: Trachea and phonation normal. No tracheal tenderness, tracheostomy or tracheal deviation.  Cardiovascular:     Rate and Rhythm: Normal rate and regular rhythm.     Pulses:          Radial pulses are 2+ on the right side and 2+ on the left side.     Heart sounds: Normal heart sounds.  Pulmonary:     Effort: Pulmonary effort is normal. No tachypnea, bradypnea, accessory muscle usage, prolonged expiration, respiratory distress or retractions. She is not intubated.     Breath sounds: Normal breath sounds. No stridor, decreased air movement or transmitted upper airway sounds. No decreased breath sounds, wheezing, rhonchi or rales.  Abdominal:     General: Abdomen is flat. Bowel sounds are normal. There is no distension or abdominal bruit. There are no signs of injury.     Palpations: Abdomen is soft. There is no shifting dullness, fluid wave, hepatomegaly, splenomegaly, mass or pulsatile mass.     Tenderness: There  is no abdominal tenderness. There is no right CVA tenderness, left CVA tenderness, guarding or rebound.     Hernia: No hernia is present.  Musculoskeletal:     Cervical back: Full passive range of motion without pain, normal range of motion and neck supple. No edema, erythema, rigidity, torticollis or crepitus. No pain with movement. Normal range of motion.     Right lower leg: No edema.     Left lower leg: No edema.  Lymphadenopathy:     Head:     Right side of head: No submental, submandibular, tonsillar, preauricular or posterior auricular adenopathy.     Left side of head: No submental, submandibular, tonsillar, preauricular or posterior auricular adenopathy.     Cervical: No cervical adenopathy.     Right cervical: No superficial, deep or posterior cervical adenopathy.    Left cervical: No superficial, deep or posterior cervical adenopathy.  Skin:    General: Skin is warm.     Capillary Refill: Capillary refill takes less than 2 seconds.  Neurological:     General: No focal  deficit present.     Mental Status: She is alert, oriented to person, place, and time and easily aroused. Mental status is at baseline.     Cranial Nerves: Cranial nerves 2-12 are intact. No cranial nerve deficit or facial asymmetry.     Motor: Motor function is intact.     Gait: Gait is intact.  Psychiatric:        Attention and Perception: Attention and perception normal.        Mood and Affect: Mood and affect normal.        Speech: Speech normal.        Behavior: Behavior normal. Behavior is cooperative.        Thought Content: Thought content normal.        Cognition and Memory: Cognition and memory normal.        Judgment: Judgment normal.       10/30/2023    3:30 PM 07/16/2023    8:55 AM 05/14/2023    9:22 AM  Depression screen PHQ 2/9  Decreased Interest 1 2 3   Down, Depressed, Hopeless 1 1 3   PHQ - 2 Score 2 3 6   Altered sleeping 3 2 3   Tired, decreased energy 1 2 3   Change in appetite 2 2 3    Feeling bad or failure about yourself  0 0 0  Trouble concentrating 2 3 3   Moving slowly or fidgety/restless 0 2 2  Suicidal thoughts 0 0 0  PHQ-9 Score 10 14 20   Difficult doing work/chores Somewhat difficult Very difficult Extremely dIfficult      10/30/2023    3:30 PM 07/16/2023    8:55 AM 05/14/2023    9:22 AM  GAD 7 : Generalized Anxiety Score  Nervous, Anxious, on Edge 1 2 3   Control/stop worrying 0 0 3  Worry too much - different things 1 1 3   Trouble relaxing 1 1 3   Restless 1 1 1   Easily annoyed or irritable 1 1 3   Afraid - awful might happen 0 0 0  Total GAD 7 Score 5 6 16   Anxiety Difficulty Somewhat difficult Very difficult Extremely difficult   Assessment/ Plan: Sheriece Lamaster here for annual physical exam.  1. Routine general medical examination at a health care facility (Primary) Discussed with patient to continue healthy lifestyle choices, including diet (rich in fruits, vegetables, and lean proteins, and low in salt and simple carbohydrates) and exercise (at least 30 minutes of moderate physical activity daily). Limit beverages high is sugar. Recommended at least 80-100 oz of water  daily.  Declined covid and flu vaccines   2. S/P bariatric surgery Labs as below. Will communicate results to patient once available. Will await results to determine next steps.  Patient is no longer following up with bariatrics.  - CBC with Differential/Platelet - CMP14+EGFR - TSH - Vitamin B12 - Magnesium   3. Family history of breast cancer Patient to continue early screening with Ob/gyn. Will request records.   4. Family history of colon cancer in father Referral placed as below for patient to begin colon cancer screening due to significant family history.  - Ambulatory referral to Gastroenterology  5. Severe episode of recurrent major depressive disorder, without psychotic features (HCC) Stable. Denies SI. Safety contract established. Patient to continue to follow up with  psychiatry.   6. Vitamin D  deficiency Labs as below. Will communicate results to patient once available. Will await results to determine next steps.  - VITAMIN D  25 Hydroxy (Vit-D Deficiency, Fractures)  7.  Vaginal discharge Based on UA, will treat for UTI. Will await culture to determine if antibiotic is appropriate. Will await results for NuSwab.  - Urinalysis, Routine w reflex microscopic - Urine Culture - NuSwab Vaginitis Plus (VG+) - nitrofurantoin , macrocrystal-monohydrate, (MACROBID ) 100 MG capsule; Take 1 capsule (100 mg total) by mouth 2 (two) times daily for 5 days.  Dispense: 10 capsule; Refill: 0  8. Pure hypercholesterolemia Labs as below. Will communicate results to patient once available. Will await results to determine next steps.  Not fasting.  - Lipid panel  9. Class 2 severe obesity due to excess calories with serious comorbidity and body mass index (BMI) of 38.0 to 38.9 in adult [facility]) Praised patient on her weight loss. Down ~20lbs since last visit. Encouraged her to continue to exercise and make dietary changes. She does not wish to follow up with bariatrics or nutrition at this time.   Patient to follow up in 1 year for annual exam or sooner if needed.  The above assessment and management plan was discussed with the patient. The patient verbalized understanding of and has agreed to the management plan. Patient is aware to call the clinic if symptoms persist or worsen. Patient is aware when to return to the clinic for a follow-up visit. Patient educated on when it is appropriate to go to the emergency department.   Marry Kins, DNP-FNP [facility] [address] [place], [place] [address] [phone]

## 2023-10-30 NOTE — Patient Instructions (Signed)
 Health Maintenance, Female Adopting a healthy lifestyle and getting preventive care are important in promoting health and wellness. Ask your health care provider about: The right schedule for you to have regular tests and exams. Things you can do on your own to prevent diseases and keep yourself healthy. What should I know about diet, weight, and exercise? Eat a healthy diet  Eat a diet that includes plenty of vegetables, fruits, low-fat dairy products, and lean protein. Do not eat a lot of foods that are high in solid fats, added sugars, or sodium. Maintain a healthy weight Body mass index (BMI) is used to identify weight problems. It estimates body fat based on height and weight. Your health care provider can help determine your BMI and help you achieve or maintain a healthy weight. Get regular exercise Get regular exercise. This is one of the most important things you can do for your health. Most adults should: Exercise for at least 150 minutes each week. The exercise should increase your heart rate and make you sweat (moderate-intensity exercise). Do strengthening exercises at least twice a week. This is in addition to the moderate-intensity exercise. Spend less time sitting. Even light physical activity can be beneficial. Watch cholesterol and blood lipids Have your blood tested for lipids and cholesterol at 43 years of age, then have this test every 5 years. Have your cholesterol levels checked more often if: Your lipid or cholesterol levels are high. You are older than 43 years of age. You are at high risk for heart disease. What should I know about cancer screening? Depending on your health history and family history, you may need to have cancer screening at various ages. This may include screening for: Breast cancer. Cervical cancer. Colorectal cancer. Skin cancer. Lung cancer. What should I know about heart disease, diabetes, and high blood pressure? Blood pressure and heart  disease High blood pressure causes heart disease and increases the risk of stroke. This is more likely to develop in people who have high blood pressure readings or are overweight. Have your blood pressure checked: Every 3-5 years if you are 109-28 years of age. Every year if you are 16 years old or older. Diabetes Have regular diabetes screenings. This checks your fasting blood sugar level. Have the screening done: Once every three years after age 62 if you are at a normal weight and have a low risk for diabetes. More often and at a younger age if you are overweight or have a high risk for diabetes. What should I know about preventing infection? Hepatitis B If you have a higher risk for hepatitis B, you should be screened for this virus. Talk with your health care provider to find out if you are at risk for hepatitis B infection. Hepatitis C Testing is recommended for: Everyone born from [birth year] through 1965. Anyone with known risk factors for hepatitis C. Sexually transmitted infections (STIs) Get screened for STIs, including gonorrhea and chlamydia, if: You are sexually active and are younger than 43 years of age. You are older than 43 years of age and your health care provider tells you that you are at risk for this type of infection. Your sexual activity has changed since you were last screened, and you are at increased risk for chlamydia or gonorrhea. Ask your health care provider if you are at risk. Ask your health care provider about whether you are at high risk for HIV. Your health care provider may recommend a prescription medicine to help prevent HIV  infection. If you choose to take medicine to prevent HIV, you should first get tested for HIV. You should then be tested every 3 months for as long as you are taking the medicine. Pregnancy If you are about to stop having your period (premenopausal) and you may become pregnant, seek counseling before you get pregnant. Take 400 to 800  micrograms (mcg) of folic acid every day if you become pregnant. Ask for birth control (contraception) if you want to prevent pregnancy. Osteoporosis and menopause Osteoporosis is a disease in which the bones lose minerals and strength with aging. This can result in bone fractures. If you are 3 years old or older, or if you are at risk for osteoporosis and fractures, ask your health care provider if you should: Be screened for bone loss. Take a calcium or vitamin D supplement to lower your risk of fractures. Be given hormone replacement therapy (HRT) to treat symptoms of menopause. Follow these instructions at home: Alcohol use Do not drink alcohol if: Your health care provider tells you not to drink. You are pregnant, may be pregnant, or are planning to become pregnant. If you drink alcohol: Limit how much you have to: 0-1 drink a day. Know how much alcohol is in your drink. In the [place] one drink equals one 12 oz bottle of beer (355 mL), one 5 oz glass of wine (148 mL), or one 1 oz glass of hard liquor (44 mL). Lifestyle Do not use any products that contain nicotine or tobacco. These products include cigarettes, chewing tobacco, and vaping devices, such as e-cigarettes. If you need help quitting, ask your health care provider. Do not use street drugs. Do not share needles. Ask your health care provider for help if you need support or information about quitting drugs. General instructions Schedule regular health, dental, and eye exams. Stay current with your vaccines. Tell your health care provider if: You often feel depressed. You have ever been abused or do not feel safe at home. Summary Adopting a healthy lifestyle and getting preventive care are important in promoting health and wellness. Follow your health care provider's instructions about healthy diet, exercising, and getting tested or screened for diseases. Follow your health care provider's instructions on monitoring your  cholesterol and blood pressure. This information is not intended to replace advice given to you by your health care provider. Make sure you discuss any questions you have with your health care provider. Document Revised: 02/27/2021 Document Reviewed: 02/27/2021 Elsevier Patient Education  2024 [employer] Colonoscopy, Adult A colonoscopy is a procedure to look at the entire large intestine. This procedure is done using a long, thin, flexible tube that has a camera on the end. You may have a colonoscopy: As a part of normal colorectal screening. If you have certain symptoms, such as: A low number of red blood cells in your blood (anemia). Diarrhea that does not go away. Pain in your abdomen. Blood in your stool. A colonoscopy can help screen for and diagnose medical problems, including: An abnormal growth of cells or tissue (tumor). Abnormal growths within the lining of your intestine (polyps). Inflammation. Areas of bleeding. Tell your health care provider about: Any allergies you have. All medicines you are taking, including vitamins, herbs, eye drops, creams, and over-the-counter medicines. Any problems you or family members have had with anesthetic medicines. Any bleeding problems you have. Any surgeries you have had. Any medical conditions you have. Any problems you have had with having bowel movements. Whether you are  pregnant or may be pregnant. What are the risks? Generally, this is a safe procedure. However, problems may occur, including: Bleeding. Damage to your intestine. Allergic reactions to medicines given during the procedure. Infection. This is rare. What happens before the procedure? Eating and drinking restrictions Follow instructions from your health care provider about eating or drinking restrictions, which may include: A few days before the procedure: Follow a low-fiber diet. Avoid nuts, seeds, dried fruit, raw fruits, and vegetables. 1-3 days before the  procedure: Eat only gelatin dessert or ice pops. Drink only clear liquids, such as water, clear juice, clear broth or bouillon, black coffee or tea, or clear soft drinks or sports drinks. Avoid liquids that contain red or purple dye. The day of the procedure: Do not eat solid foods. You may continue to drink clear liquids until up to 2 hours before the procedure. Do not eat or drink anything starting 2 hours before the procedure, or within the time period that your health care provider recommends. Bowel prep If you were prescribed a bowel prep to take by mouth (orally) to clean out your colon: Take it as told by your health care provider. Starting the day before your procedure, you will need to drink a large amount of liquid medicine. The liquid will cause you to have many bowel movements of loose stool until your stool becomes almost clear or light green. If your skin or the opening between the buttocks (anus) gets irritated from diarrhea, you may relieve the irritation using: Wipes with medicine in them, such as adult wet wipes with aloe and vitamin E. A product to soothe skin, such as petroleum jelly. If you vomit while drinking the bowel prep: Take a break for up to 60 minutes. Begin the bowel prep again. Call your health care provider if you keep vomiting or you cannot take the bowel prep without vomiting. To clean out your colon, you may also be given: Laxative medicines. These help you have a bowel movement. Instructions for enema use. An enema is liquid medicine injected into your rectum. Medicines Ask your health care provider about: Changing or stopping your regular medicines or supplements. This is especially important if you are taking iron supplements, diabetes medicines, or blood thinners. Taking medicines such as aspirin and ibuprofen. These medicines can thin your blood. Do not take these medicines unless your health care provider tells you to take them. Taking  over-the-counter medicines, vitamins, herbs, and supplements. General instructions Ask your health care provider what steps will be taken to help prevent infection. These may include washing skin with a germ-killing soap. If you will be going home right after the procedure, plan to have a responsible adult: Take you home from the hospital or clinic. You will not be allowed to drive. Care for you for the time you are told. What happens during the procedure?  An IV will be inserted into one of your veins. You will be given a medicine to make you fall asleep (general anesthetic). You will lie on your side with your knees bent. A lubricant will be put on the tube. Then the tube will be: Inserted into your anus. Gently eased through all parts of your large intestine. Air will be sent into your colon to keep it open. This may cause some pressure or cramping. Images will be taken with the camera and will appear on a screen. A small tissue sample may be removed to be looked at under a microscope (biopsy). The tissue may  be sent to a lab for testing if any signs of problems are found. If small polyps are found, they may be removed and checked for cancer cells. When the procedure is finished, the tube will be removed. The procedure may vary among health care providers and hospitals. What happens after the procedure? Your blood pressure, heart rate, breathing rate, and blood oxygen level will be monitored until you leave the hospital or clinic. You may have a small amount of blood in your stool. You may pass gas and have mild cramping or bloating in your abdomen. This is caused by the air that was used to open your colon during the exam. If you were given a sedative during the procedure, it can affect you for several hours. Do not drive or operate machinery until your health care provider says that it is safe. It is up to you to get the results of your procedure. Ask your health care provider, or the  department that is doing the procedure, when your results will be ready. Summary A colonoscopy is a procedure to look at the entire large intestine. Follow instructions from your health care provider about eating and drinking before the procedure. If you were prescribed an oral bowel prep to clean out your colon, take it as told by your health care provider. During the colonoscopy, a flexible tube with a camera on its end is inserted into the anus and then passed into all parts of the large intestine. This information is not intended to replace advice given to you by your health care provider. Make sure you discuss any questions you have with your health care provider. Document Revised: 11/20/2022 Document Reviewed: 05/31/2021 Elsevier Patient Education  2024 [employer].

## 2023-10-30 NOTE — Progress Notes (Signed)
 Please let patient know that I sent in Macrobid due to UA. Will wait for culture and Nuswab results to determine if treatment needs to change.

## 2023-10-31 LAB — CBC WITH DIFFERENTIAL/PLATELET
Basophils Absolute: 0 10*3/uL (ref 0.0–0.2)
Basos: 1 %
EOS (ABSOLUTE): 0.2 10*3/uL (ref 0.0–0.4)
Eos: 3 %
Hematocrit: 41.3 % (ref 34.0–46.6)
Hemoglobin: 13.3 g/dL (ref 11.1–15.9)
Immature Grans (Abs): 0 10*3/uL (ref 0.0–0.1)
Immature Granulocytes: 0 %
Lymphocytes Absolute: 2.5 10*3/uL (ref 0.7–3.1)
Lymphs: 45 %
MCH: 27.2 pg (ref 26.6–33.0)
MCHC: 32.2 g/dL (ref 31.5–35.7)
MCV: 85 fL (ref 79–97)
Monocytes Absolute: 0.3 10*3/uL (ref 0.1–0.9)
Monocytes: 5 %
Neutrophils Absolute: 2.5 10*3/uL (ref 1.4–7.0)
Neutrophils: 46 %
Platelets: 209 10*3/uL (ref 150–450)
RBC: 4.89 x10E6/uL (ref 3.77–5.28)
RDW: 12.6 % (ref 11.7–15.4)
WBC: 5.5 10*3/uL (ref 3.4–10.8)

## 2023-10-31 LAB — TSH: TSH: 1.72 u[IU]/mL (ref 0.450–4.500)

## 2023-10-31 LAB — VITAMIN D 25 HYDROXY (VIT D DEFICIENCY, FRACTURES): Vit D, 25-Hydroxy: 33.7 ng/mL (ref 30.0–100.0)

## 2023-10-31 LAB — CMP14+EGFR
ALT: 10 [IU]/L (ref 0–32)
AST: 13 [IU]/L (ref 0–40)
Albumin: 4.1 g/dL (ref 3.9–4.9)
Alkaline Phosphatase: 106 [IU]/L (ref 44–121)
BUN/Creatinine Ratio: 15 (ref 9–23)
BUN: 13 mg/dL (ref 6–24)
Bilirubin Total: 0.3 mg/dL (ref 0.0–1.2)
CO2: 25 mmol/L (ref 20–29)
Calcium: 9.6 mg/dL (ref 8.7–10.2)
Chloride: 103 mmol/L (ref 96–106)
Creatinine, Ser: 0.85 mg/dL (ref 0.57–1.00)
Globulin, Total: 2.1 g/dL (ref 1.5–4.5)
Glucose: 79 mg/dL (ref 70–99)
Potassium: 4.1 mmol/L (ref 3.5–5.2)
Sodium: 140 mmol/L (ref 134–144)
Total Protein: 6.2 g/dL (ref 6.0–8.5)
eGFR: 88 mL/min/{1.73_m2} (ref >59)

## 2023-10-31 LAB — LIPID PANEL
Chol/HDL Ratio: 3.7 {ratio} (ref 0.0–4.4)
Cholesterol, Total: 202 mg/dL — ABNORMAL HIGH (ref 100–199)
HDL: 54 mg/dL (ref >39)
LDL Chol Calc (NIH): 136 mg/dL — ABNORMAL HIGH (ref 0–99)
Triglycerides: 66 mg/dL (ref 0–149)
VLDL Cholesterol Cal: 12 mg/dL (ref 5–40)

## 2023-10-31 LAB — VITAMIN B12: Vitamin B-12: 1124 pg/mL (ref 232–1245)

## 2023-10-31 LAB — MAGNESIUM: Magnesium: 2 mg/dL (ref 1.6–2.3)

## 2023-11-01 LAB — NUSWAB VAGINITIS PLUS (VG+)
Candida albicans, NAA: NEGATIVE
Candida glabrata, NAA: NEGATIVE
Chlamydia trachomatis, NAA: NEGATIVE
Neisseria gonorrhoeae, NAA: NEGATIVE
Trich vag by NAA: NEGATIVE

## 2023-11-03 LAB — URINE CULTURE

## 2023-11-05 NOTE — Progress Notes (Signed)
 Cholesterol is elevated. Diet encouraged - increase intake of fresh fruits and vegetables, increase intake of lean proteins. Bake, broil, or grill foods. Avoid fried, greasy, and fatty foods. Avoid fast foods. Increase intake of fiber-rich whole grains. Exercise encouraged - at least 150 minutes per week and advance as tolerated. Can also try red yeast rice and we will recheck in 3 months.  The 10-year ASCVD risk score (Arnett DK, et al., 2019) is: 1.7%  Macrobid  is appropriate for UTI.  Negative for yeast, BV, gonorrhea, chlamydia   All other labs normal.

## 2023-11-06 ENCOUNTER — Encounter (INDEPENDENT_AMBULATORY_CARE_PROVIDER_SITE_OTHER): Payer: Self-pay | Admitting: *Deleted

## 2023-11-20 ENCOUNTER — Other Ambulatory Visit: Payer: Self-pay | Admitting: *Deleted

## 2023-11-20 ENCOUNTER — Telehealth: Payer: Self-pay | Admitting: *Deleted

## 2023-11-20 DIAGNOSIS — D447 Neoplasm of uncertain behavior of aortic body and other paraganglia: Secondary | ICD-10-CM

## 2023-11-20 MED ORDER — PROPRANOLOL HCL 40 MG PO TABS
40.0000 mg | ORAL_TABLET | Freq: Every day | ORAL | 0 refills | Status: DC
Start: 2023-11-20 — End: 2024-01-28

## 2023-11-20 NOTE — Telephone Encounter (Signed)
PC to patient, informed her she needs to schedule her brain MRI by calling [facility], phone number given.  Her appointment with Dr Barbaraann Cao is 12/12/23 so the MRI needs to be completed before.  Patient verbalizes understanding & states she will schedule MRI.

## 2023-11-30 ENCOUNTER — Ambulatory Visit (HOSPITAL_COMMUNITY)
Admission: RE | Admit: 2023-11-30 | Discharge: 2023-11-30 | Disposition: A | Discharge status: Home / Self Care | Payer: 59 | Source: Ambulatory Visit | Attending: Internal Medicine | Admitting: Internal Medicine

## 2023-11-30 DIAGNOSIS — D447 Neoplasm of uncertain behavior of aortic body and other paraganglia: Secondary | ICD-10-CM | POA: Insufficient documentation

## 2023-11-30 MED ORDER — GADOBUTROL 1 MMOL/ML IV SOLN
10.0000 mL | Freq: Once | INTRAVENOUS | Status: AC | PRN
  Administered 2023-11-30: 10 mL via INTRAVENOUS

## 2023-12-03 ENCOUNTER — Telehealth (INDEPENDENT_AMBULATORY_CARE_PROVIDER_SITE_OTHER): Payer: Self-pay | Admitting: Gastroenterology

## 2023-12-03 DIAGNOSIS — Z1211 Encounter for screening for malignant neoplasm of colon: Secondary | ICD-10-CM

## 2023-12-03 NOTE — Telephone Encounter (Signed)
Who is your primary care physician: Neale Burly  Reasons for the colonoscopy:   Have you had a colonoscopy before?  no  Do you have family history of colon cancer? Yes Dad, uncles, aunt,brother  Previous colonoscopy with polyps removed? no  Do you have a history colorectal cancer?   no  Are you diabetic? If yes, Type 1 or Type 2?    no  Do you have a prosthetic or mechanical heart valve? no  Do you have a pacemaker/defibrillator?   no  Have you had endocarditis/atrial fibrillation? no  Have you had joint replacement within the last 12 months?  no  Do you tend to be constipated or have to use laxatives? no  Do you have any history of drugs or alchohol?  no  Do you use supplemental oxygen?  no  Have you had a stroke or heart attack within the last 6 months? no  Do you take weight loss medication?  no  For female patients: have you had a hysterectomy?  no                                     are you post menopausal?       no                                            do you still have your menstrual cycle? yes      Do you take any blood-thinning medications such as: (aspirin, warfarin, Plavix, Aggrenox)  no  If yes we need the name, milligram, dosage and who is prescribing doctor  Current Outpatient Medications on File Prior to Visit  Medication Sig Dispense Refill   albuterol (VENTOLIN HFA) 108 (90 Base) MCG/ACT inhaler Inhale 1-2 puffs into the lungs every 6 (six) hours as needed for wheezing or shortness of breath.     budesonide-formoterol (SYMBICORT) 80-4.5 MCG/ACT inhaler Inhale 2 puffs into the lungs in the morning and at bedtime. 1 each 12   DULoxetine (CYMBALTA) 60 MG capsule Take 60 mg by mouth at bedtime.     MYFEMBREE 40-1-0.5 MG TABS Take 1 tablet by mouth daily.     pantoprazole (PROTONIX) 40 MG tablet Take 40 mg by mouth daily.     albuterol (PROVENTIL) (5 MG/ML) 0.5% nebulizer solution Take 0.5 mLs (2.5 mg total) by nebulization every 6 (six) hours as  needed for wheezing or shortness of breath. (Patient not taking: Reported on 12/03/2023) 20 mL 12   lisdexamfetamine (VYVANSE) 50 MG capsule Take 50 mg by mouth every morning. (Patient not taking: Reported on 12/03/2023)     propranolol (INDERAL) 40 MG tablet Take 1 tablet (40 mg total) by mouth at bedtime. (Patient not taking: Reported on 12/03/2023) 30 tablet 0   zolpidem (AMBIEN) 10 MG tablet Take 10 mg by mouth at bedtime. (Patient not taking: Reported on 12/03/2023)     No current facility-administered medications on file prior to visit.    Allergies  Allergen Reactions   Ceftriaxone Hives and Rash    No SOB - patient willing to try again   Diclofenac Rash   Fluconazole Hives    Raw rash on abdomen and she had sores and swelling in mouth     Pineapple Shortness Of Breath   Latex Rash  Bactrim [Sulfamethoxazole-Trimethoprim] Hives     [facility]: [facility]   Primary Insurance Name: [facility] [employer] [facility]  Best number where you can be reached: [phone]

## 2023-12-04 NOTE — Telephone Encounter (Signed)
Ok to schedule.  Room 1/2  Thanks,  Vista Lawman, MD Gastroenterology and Hepatology [facility]

## 2023-12-09 MED ORDER — PEG 3350-KCL-NA BICARB-NACL 420 G PO SOLR: 4000.0000 mL | Freq: Once | ORAL | 0 refills | Status: AC

## 2023-12-09 NOTE — Telephone Encounter (Signed)
Pt returned call and scheduled for 12/19/23 at 1:00pm. Instructions placed on my chart. Prep sent to pharmacy. No pa required per insurance.

## 2023-12-09 NOTE — Telephone Encounter (Signed)
Contacted patient. 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 Pt will call back

## 2023-12-09 NOTE — Addendum Note (Signed)
Addended by: Marlowe Shores on: 12/09/2023 11:17 AM   Modules accepted: Orders

## 2023-12-10 ENCOUNTER — Encounter (INDEPENDENT_AMBULATORY_CARE_PROVIDER_SITE_OTHER): Payer: Self-pay | Admitting: *Deleted

## 2023-12-10 NOTE — Telephone Encounter (Signed)
 Referral completed, [facility] apt letter sent to PCP

## 2023-12-12 ENCOUNTER — Inpatient Hospital Stay: Payer: 59 | Attending: Internal Medicine | Admitting: Internal Medicine

## 2023-12-12 VITALS — BP 141/98 | HR 88 | Temp 97.7°F | Resp 19 | Ht 64.5 in | Wt 233.2 lb

## 2023-12-12 DIAGNOSIS — D447 Neoplasm of uncertain behavior of aortic body and other paraganglia: Secondary | ICD-10-CM | POA: Diagnosis present

## 2023-12-12 NOTE — Progress Notes (Signed)
[facility] [address]  [place], [place] [address] [phone]   Interval Evaluation  Date of Service: 12/12/23 Patient Name: Shannon [name] Patient MRN: [id number] Patient DOB: 02-Nov-1980 Provider: Henreitta Leber, MD  Identifying Statement:  Shannon [name] is a 43 y.o. female with  cauda equina   paraganglioma    Oncologic History 03/17/21: Laminectomy, resection by Dr. Wynetta Emery.  Path demonstrates paraganglioma  Interval History: Shannon [name] presents today after MRI spine.  No new neurologic symptoms described today.  Propanolol has been discontinued for headache prevention.  Continues to follow with cardiology, PCP.  H+P (04/13/21) Patient presented to medical attention in May 2022 with several months history of severe, refractory lower back pain.  Initially treated for sciatica, she eventually obtained an MRI which demonstrated a tumor within the roots of the cauda equina.  She also has complained of headaches, excess sweating, palpitations at times.  She underwent laminectomy and resection of the lesion with Dr. Wynetta Emery on 5/27, path demonstrated paraganglioma.  Since then her pain is significantly improved.  There was some weakness after surgery but it improved with short rehab stint.  Over past month, no new or progressive deficits.  Medications: Current Outpatient Medications on File Prior to Visit  Medication Sig Dispense Refill   albuterol (PROVENTIL) (5 MG/ML) 0.5% nebulizer solution Take 0.5 mLs (2.5 mg total) by nebulization every 6 (six) hours as needed for wheezing or shortness of breath. (Patient not taking: Reported on 12/03/2023) 20 mL 12   albuterol (VENTOLIN HFA) 108 (90 Base) MCG/ACT inhaler Inhale 1-2 puffs into the lungs every 6 (six) hours as needed for wheezing or shortness of breath.     budesonide-formoterol (SYMBICORT) 80-4.5 MCG/ACT inhaler Inhale 2 puffs into the lungs in the morning and at bedtime. 1 each 12   DULoxetine  (CYMBALTA) 60 MG capsule Take 60 mg by mouth at bedtime.     lisdexamfetamine (VYVANSE) 50 MG capsule Take 50 mg by mouth every morning. (Patient not taking: Reported on 12/03/2023)     MYFEMBREE 40-1-0.5 MG TABS Take 1 tablet by mouth daily.     pantoprazole (PROTONIX) 40 MG tablet Take 40 mg by mouth daily.     propranolol (INDERAL) 40 MG tablet Take 1 tablet (40 mg total) by mouth at bedtime. (Patient not taking: Reported on 12/03/2023) 30 tablet 0   zolpidem (AMBIEN) 10 MG tablet Take 10 mg by mouth at bedtime. (Patient not taking: Reported on 12/03/2023)     No current facility-administered medications on file prior to visit.    Allergies:  Allergies  Allergen Reactions   Ceftriaxone Hives and Rash    No SOB - patient willing to try again   Diclofenac Rash   Fluconazole Hives    Raw rash on abdomen and she had sores and swelling in mouth     Pineapple Shortness Of Breath   Latex Rash   Bactrim [Sulfamethoxazole-Trimethoprim] Hives   Past Medical History:  Past Medical History:  Diagnosis Date   Acid reflux 2022   ADD (attention deficit disorder)    Anemia 08/23/2021   on 09/27/21 HGB was 10.8 (Epic)   Anxiety    Asthma    Back pain    Class 2 obesity due to excess calories without serious comorbidity with body mass index (BMI) of 39.0 to 39.9 in adult 05/26/2019   Depression    Elevated blood-pressure reading without diagnosis of hypertension 05/26/2019   Family history of breast  cancer 04/28/2021   Family history of ovarian cancer 04/28/2021   Family history of pancreatic cancer 04/28/2021   Genetic testing 05/08/2021   Negative genetic testing: no pathogenic variants detected in [facility] CancerNext-Expanded +RNAinsight Panel.  The report date is May 02, 2021.  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   Pre-eclampsia in postpartum period 08/26/2015   Susceptible to varicella (non-immune), currently pregnant 08/18/2015   SVD (spontaneous vaginal delivery) 08/18/2015   Vitamin D deficiency    Wears contact lenses    Wears glasses    Past Surgical History:  Past Surgical History:  Procedure Laterality Date   BACK SURGERY     BIOPSY  07/25/2021   Procedure: BIOPSY;  Surgeon: Quentin Ore, MD;  Location: [facility];  Service: General;;   DILATATION & CURETTAGE/HYSTEROSCOPY WITH MYOSURE N/A 10/12/2021   Procedure: DILATATION & CURETTAGE/HYSTEROSCOPY;  Surgeon: Osborn Coho, MD;  Location: [facility];  Service: Gynecology;  Laterality: N/A;   DILATATION & CURRETTAGE/HYSTEROSCOPY WITH RESECTOCOPE N/A 02/03/2014   Procedure: DILATATION & CURETTAGE/HYSTEROSCOPY ;  Surgeon: Purcell Nails, MD;  Location: [facility];  Service: Gynecology;  Laterality: N/A;   DILATION AND CURETTAGE OF UTERUS  2001   ENDOMETRIAL ABLATION N/A 10/12/2021   Procedure: ENDOMETRIAL ABLATION;  Surgeon: Osborn Coho, MD;  Location: [facility];  Service: Gynecology;  Laterality: N/A;   ESOPHAGOGASTRODUODENOSCOPY N/A 07/25/2021   Procedure: ESOPHAGOGASTRODUODENOSCOPY (EGD);  Surgeon: Quentin Ore, MD;  Location: [facility];  Service: General;  Laterality: N/A;   LAMINECTOMY N/A 03/17/2021   Procedure: Laminectomy - Lumbar Four-Lumbar Five for Intradural mass;  Surgeon: Donalee Citrin, MD;  Location: [facility];  Service: Neurosurgery;  Laterality: N/A;  3C   LAPAROSCOPIC GASTRIC SLEEVE RESECTION  09/26/2021   MYOMECTOMY N/A 02/03/2014   Procedure: MYOMECTOMY  ;  Surgeon: Purcell Nails, MD;  Location: [facility];  Service: Gynecology;  Laterality: N/A;   SALPINGECTOMY  2016   TONSILLECTOMY     age 23   TUBAL LIGATION N/A 08/19/2015   Procedure: POST PARTUM TUBAL LIGATION;  Surgeon: Osborn Coho, MD;  Location: [facility];  Service: Gynecology;  Laterality: N/A;   UPPER GI ENDOSCOPY N/A 09/26/2021   Procedure: UPPER GI ENDOSCOPY;  Surgeon: Quentin Ore, MD;  Location: [facility];  Service: General;  Laterality: N/A;   WISDOM TOOTH EXTRACTION     age 74   Social History:  Social History   Socioeconomic History   Marital status: Divorced    Spouse name: Not on file   Number of children: Not on file   Years of education: Not on file   Highest education level: Not on file  Occupational History   Not on file  Tobacco Use   Smoking status: Never   Smokeless tobacco: Never  Vaping Use   Vaping status: Never Used  Substance and Sexual Activity   Alcohol use: Never   Drug use: No   Sexual activity: Yes    Birth  control/protection: None  Other Topics Concern   Not on file  Social History Narrative   Not on file   Social Drivers of Health   Financial Resource Strain: Not on file  Food Insecurity: Not on file  Transportation Needs: Not on file  Physical Activity: Not on file  Stress: Not on file  Social Connections: Not on file  Intimate Partner Violence: Not on file   Family History:  Family History  Problem Relation Age of Onset   Hypertension Mother    Diabetes Mother    Mental illness Mother    Hyperlipidemia Mother    Stroke Mother    Depression Mother    Anxiety disorder Mother    Bipolar disorder Mother    Liver disease Mother    Sleep apnea Mother    Obesity Mother    Hypertension Father    Asthma Brother    Seizures Brother    Pancreatic cancer Maternal Aunt 48   Lung cancer Maternal Aunt        dx after 11; smoking hx   Liver cancer Maternal Uncle        dx after 52   Breast cancer Paternal Aunt         two paternal aunts, age 61s-40s   Ovarian cancer Paternal Aunt        two paternal aunts, reported BRCA mutation   Colon polyps Paternal Uncle        two paternal uncles; more than 10 lifetime polyps   Heart disease Maternal Grandmother    Hypertension Maternal Grandmother    Breast cancer Maternal Grandmother        dx 30s-40s; d. 14   Ovarian cancer Paternal Grandmother    Ovarian cancer Other        PGM's mother   Breast cancer Cousin        dx 42s   Ovarian cancer Cousin        dx 17s    Review of Systems: Constitutional: Doesn't report fevers, chills or abnormal weight loss Eyes: Doesn't report blurriness of vision Ears, nose, mouth, throat, and face: Doesn't report sore throat Respiratory: Doesn't report cough, dyspnea or wheezes Cardiovascular: Doesn't report palpitation, chest discomfort  Gastrointestinal:  Doesn't report nausea, constipation, diarrhea GU: Doesn't report incontinence Skin: Doesn't report skin rashes Neurological: Per HPI Musculoskeletal: Doesn't report joint pain Behavioral/Psych: Doesn't report anxiety  Physical Exam: Vitals:   12/12/23 1107 12/12/23 1118  BP: (!) 140/96 (!) 141/98  Pulse: 88   Resp: 19   Temp: 97.7 F (36.5 C)   SpO2: 100%     KPS: 80. General: Alert, cooperative, pleasant, in no acute distress Head: Normal EENT: No conjunctival injection or scleral icterus.  Lungs: Resp effort normal Cardiac: Regular rate Abdomen: Non-distended abdomen Skin: No rashes cyanosis or petechiae. Extremities: No clubbing or edema  Neurologic Exam: Mental Status: Awake, alert, attentive to examiner. Oriented to self and environment. Language is fluent with intact comprehension.  Cranial Nerves: Visual acuity is grossly normal. Visual fields are full. Extra-ocular movements intact. No ptosis. Face is symmetric Motor: Tone and bulk are normal. Power is full in both arms and legs. Reflexes are diminished, no pathologic reflexes present.   Sensory: Intact to light touch Gait: Normal.   Labs: I have reviewed the data as listed    Component Value Date/Time   NA 140 10/30/2023 1608   K 4.1 10/30/2023 1608   CL 103 10/30/2023 1608   CO2 25 10/30/2023 1608  GLUCOSE 79 10/30/2023 1608   GLUCOSE 100 (H) 02/20/2023 2126   BUN 13 10/30/2023 1608   CREATININE 0.85 10/30/2023 1608   CREATININE 0.77 11/20/2012 1535   CALCIUM 9.6 10/30/2023 1608   PROT 6.2 10/30/2023 1608   ALBUMIN 4.1 10/30/2023 1608   AST 13 10/30/2023 1608   ALT 10 10/30/2023 1608   ALKPHOS 106 10/30/2023 1608   BILITOT 0.3 10/30/2023 1608   GFRNONAA >60 02/20/2023 2126   GFRAA 92 08/08/2020 1653   Lab Results  Component Value Date   WBC 5.5 10/30/2023   NEUTROABS 2.5 10/30/2023   HGB 13.3 10/30/2023   HCT 41.3 10/30/2023   MCV 85 10/30/2023   PLT 209 10/30/2023    Imaging:  [facility] Clinician Interpretation: I have personally reviewed the CNS images as listed.  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 Staging examination. EXAM: MRI LUMBAR SPINE WITHOUT AND WITH CONTRAST TECHNIQUE: Multiplanar and multiecho pulse sequences of the lumbar spine were obtained without and with intravenous contrast. CONTRAST:  10 mL GADAVIST GADOBUTROL 1 MMOL/ML IV SOLN COMPARISON:  MRI lumbar spine 12/11/2022, 12/12/2021 and 05/29/2021. FINDINGS: Segmentation:  Standard. Alignment:  Normal. Vertebrae:  No fracture, evidence of discitis, or bone lesion. Conus medullaris and cauda equina: Conus extends to the L2 level. Conus and cauda equina appear normal. No pathologic enhancement after contrast administration. Negative for residual or recurrent tumor. Paraspinal and other soft tissues: Negative. Disc levels: The central spinal canal and neural foramina are widely patent at  all levels. Shallow disc bulge at L4-5 again seen. IMPRESSION: Negative for residual or recurrent tumor. No change compared to the prior exams. Electronically Signed   By: Drusilla Kanner M.D.   On: 11/30/2023 10:24   Pending official read  Assessment/Plan Paraganglioma (HCC)  Shannon [name] is clinically and radiographically stable today.  Imaging does not demonstrate any recurrence or progression.  No other new or progressive changes.  We appreciate the opportunity to participate in the care of Shannon [name].   We ask that Shannon [name] return to clinic in 24 months following next lumbar spine MRI, or sooner as needed.  All questions were answered. The patient knows to call the clinic with any problems, questions or concerns. No barriers to learning were detected.  The total time spent in the encounter was 30 minutes and more than 50% was on counseling and review of test results   Henreitta Leber, MD Medical Director of Neuro-Oncology [facility] 12/12/23 11:09 AM

## 2023-12-13 ENCOUNTER — Other Ambulatory Visit: Payer: Self-pay

## 2023-12-13 ENCOUNTER — Telehealth: Payer: Self-pay | Admitting: Internal Medicine

## 2023-12-13 NOTE — Telephone Encounter (Signed)
 Marland Kitchen

## 2023-12-16 ENCOUNTER — Other Ambulatory Visit: Payer: Self-pay

## 2023-12-16 ENCOUNTER — Encounter (HOSPITAL_COMMUNITY)
Admission: RE | Admit: 2023-12-16 | Discharge: 2023-12-16 | Disposition: A | Discharge status: Home / Self Care | Payer: 59 | Source: Ambulatory Visit | Attending: Gastroenterology | Admitting: Gastroenterology

## 2023-12-16 ENCOUNTER — Encounter (HOSPITAL_COMMUNITY): Payer: Self-pay

## 2023-12-19 ENCOUNTER — Encounter (HOSPITAL_COMMUNITY): Payer: Self-pay | Admitting: Gastroenterology

## 2023-12-19 ENCOUNTER — Encounter (HOSPITAL_COMMUNITY)
Admission: RE | Disposition: A | Discharge status: Home / Self Care | Payer: Self-pay | Source: Ambulatory Visit | Attending: Gastroenterology

## 2023-12-19 ENCOUNTER — Other Ambulatory Visit: Payer: Self-pay

## 2023-12-19 ENCOUNTER — Ambulatory Visit (HOSPITAL_COMMUNITY): Payer: 59 | Admitting: Anesthesiology

## 2023-12-19 ENCOUNTER — Ambulatory Visit (HOSPITAL_BASED_OUTPATIENT_CLINIC_OR_DEPARTMENT_OTHER): Payer: 59 | Admitting: Anesthesiology

## 2023-12-19 ENCOUNTER — Ambulatory Visit (HOSPITAL_COMMUNITY)
Admission: RE | Admit: 2023-12-19 | Discharge: 2023-12-19 | Disposition: A | Discharge status: Home / Self Care | Payer: 59 | Source: Ambulatory Visit | Attending: Gastroenterology | Admitting: Gastroenterology

## 2023-12-19 DIAGNOSIS — J45909 Unspecified asthma, uncomplicated: Secondary | ICD-10-CM

## 2023-12-19 DIAGNOSIS — F418 Other specified anxiety disorders: Secondary | ICD-10-CM | POA: Insufficient documentation

## 2023-12-19 DIAGNOSIS — G709 Myoneural disorder, unspecified: Secondary | ICD-10-CM | POA: Insufficient documentation

## 2023-12-19 DIAGNOSIS — K219 Gastro-esophageal reflux disease without esophagitis: Secondary | ICD-10-CM | POA: Diagnosis not present

## 2023-12-19 DIAGNOSIS — I10 Essential (primary) hypertension: Secondary | ICD-10-CM

## 2023-12-19 DIAGNOSIS — Z8249 Family history of ischemic heart disease and other diseases of the circulatory system: Secondary | ICD-10-CM | POA: Insufficient documentation

## 2023-12-19 DIAGNOSIS — K648 Other hemorrhoids: Secondary | ICD-10-CM | POA: Insufficient documentation

## 2023-12-19 DIAGNOSIS — Z6841 Body Mass Index (BMI) 40.0 and over, adult: Secondary | ICD-10-CM | POA: Diagnosis not present

## 2023-12-19 DIAGNOSIS — Z1211 Encounter for screening for malignant neoplasm of colon: Secondary | ICD-10-CM

## 2023-12-19 DIAGNOSIS — E66813 Obesity, class 3: Secondary | ICD-10-CM | POA: Insufficient documentation

## 2023-12-19 DIAGNOSIS — Z800 Family history of malignant neoplasm of digestive organs: Secondary | ICD-10-CM | POA: Diagnosis not present

## 2023-12-19 DIAGNOSIS — Z79899 Other long term (current) drug therapy: Secondary | ICD-10-CM | POA: Diagnosis not present

## 2023-12-19 HISTORY — PX: COLONOSCOPY WITH PROPOFOL: SHX5780

## 2023-12-19 LAB — POCT PREGNANCY, URINE: Preg Test, Ur: NEGATIVE

## 2023-12-19 SURGERY — COLONOSCOPY WITH PROPOFOL
Anesthesia: General

## 2023-12-19 MED ORDER — PROPOFOL 10 MG/ML IV BOLUS
INTRAVENOUS | Status: DC | PRN
  Administered 2023-12-19: 100 mg via INTRAVENOUS
  Administered 2023-12-19: 50 mg via INTRAVENOUS

## 2023-12-19 MED ORDER — LIDOCAINE HCL (PF) 2 % IJ SOLN
INTRAMUSCULAR | Status: DC | PRN
  Administered 2023-12-19: 50 mg via INTRADERMAL

## 2023-12-19 MED ORDER — DEXMEDETOMIDINE HCL IN NACL 80 MCG/20ML IV SOLN
INTRAVENOUS | Status: DC | PRN
  Administered 2023-12-19: 8 ug via INTRAVENOUS

## 2023-12-19 MED ORDER — LACTATED RINGERS IV SOLN: INTRAVENOUS | Status: DC | PRN

## 2023-12-19 MED ORDER — SODIUM CHLORIDE 0.9% FLUSH: 3.0000 mL | Freq: Two times a day (BID) | INTRAVENOUS | Status: DC

## 2023-12-19 MED ORDER — SODIUM CHLORIDE 0.9% FLUSH: 3.0000 mL | INTRAVENOUS | Status: DC | PRN

## 2023-12-19 MED ORDER — STERILE WATER FOR IRRIGATION IR SOLN
Status: DC | PRN
  Administered 2023-12-19: 60 mL

## 2023-12-19 MED ORDER — PROPOFOL 500 MG/50ML IV EMUL
INTRAVENOUS | Status: DC | PRN
Start: 2023-12-19 — End: 2023-12-19
  Administered 2023-12-19: 150 ug/kg/min via INTRAVENOUS

## 2023-12-19 NOTE — Anesthesia Procedure Notes (Signed)
 Date/Time: 12/19/2023 12:14 PM  Performed by: Julian Reil, CRNAPre-anesthesia Checklist: Patient identified, Emergency Drugs available, Suction available and Patient being monitored Patient Re-evaluated:Patient Re-evaluated prior to induction Oxygen Delivery Method: Simple face mask Induction Type: IV induction Placement Confirmation: positive ETCO2

## 2023-12-19 NOTE — Transfer of Care (Signed)
 Immediate Anesthesia Transfer of Care Note  Patient: Shannon [name]  Procedure(s) Performed: COLONOSCOPY WITH PROPOFOL  Patient Location: Short Stay  Anesthesia Type:General  Level of Consciousness: drowsy  Airway & Oxygen Therapy: Patient Spontanous Breathing  Post-op Assessment: Report given to RN and Post -op Vital signs reviewed and stable  Post vital signs: Reviewed and stable  Last Vitals:  Vitals Value Taken Time  BP    Temp    Pulse    Resp    SpO2      Last Pain:  Vitals:   12/19/23 1114  TempSrc: Oral  PainSc: 0-No pain      Patients Stated Pain Goal: 3 (12/19/23 1114)  Complications: No notable events documented.

## 2023-12-19 NOTE — Discharge Instructions (Signed)

## 2023-12-19 NOTE — Anesthesia Preprocedure Evaluation (Signed)
 Anesthesia Evaluation  Patient identified by MRN, date of birth, ID band Patient awake    Reviewed: Allergy & Precautions, NPO status , Patient's Chart, lab work & pertinent test results  Airway Mallampati: I  TM Distance: >3 FB Neck ROM: Full    Dental no notable dental hx. (+) Teeth Intact, Dental Advisory Given   Pulmonary asthma    Pulmonary exam normal breath sounds clear to auscultation       Cardiovascular hypertension, Pt. on medications Normal cardiovascular exam Rhythm:Regular Rate:Normal     Neuro/Psych  Headaches PSYCHIATRIC DISORDERS Anxiety Depression     Neuromuscular disease    GI/Hepatic Neg liver ROS,GERD  ,,  Endo/Other    Class 3 obesity (BMI 41.08)  Renal/GU negative Renal ROS     Musculoskeletal   Abdominal  (+) + obese  Peds  Hematology  (+) Blood dyscrasia, anemia Lab Results      Component                Value               Date                      WBC                      5.5                 10/10/2021                HGB                      12.7                10/10/2021                HCT                      39.9                10/10/2021                MCV                      81.1                10/10/2021                PLT                      316                 10/10/2021           Lab Results      Component                Value               Date                      WBC                      5.5                 10/10/2021                HGB  12.7                10/10/2021                HCT                      39.9                10/10/2021                MCV                      81.1                10/10/2021                PLT                      316                 10/10/2021              Anesthesia Other Findings All: Ceftriaxon, Diclofenac, Fluconazole  Reproductive/Obstetrics negative OB ROS                              Anesthesia Physical Anesthesia Plan  ASA: 3  Anesthesia Plan: General   Post-op Pain Management: Minimal or no pain anticipated   Induction:   PONV Risk Score and Plan: Propofol infusion  Airway Management Planned: Nasal Cannula and Natural Airway  Additional Equipment: None  Intra-op Plan:   Post-operative Plan:   Informed Consent: I have reviewed the patients History and Physical, chart, labs and discussed the procedure including the risks, benefits and alternatives for the proposed anesthesia with the patient or authorized representative who has indicated his/her understanding and acceptance.     Dental advisory given  Plan Discussed with: CRNA  Anesthesia Plan Comments:         Anesthesia Quick Evaluation

## 2023-12-19 NOTE — H&P (Signed)
 Primary Care Physician:  Arrie Senate, FNP Primary Gastroenterologist:  Dr. Tasia Catchings  Pre-Procedure History & Physical: HPI:  Shannon [name] is a 43 y.o. female is here for a colonoscopy for colon cancer screening purposes.    Patient has a very strong family history of colon cancer , father , 1/2 brother and multiple uncles from father side with colon cancer   Youngest person diagnosed with colon cancer age 72. They are going through genetic genetic     No melena or hematochezia.  No abdominal pain or unintentional weight loss.  No change in bowel habits.  Overall feels well from a GI standpoint.  Past Medical History:  Diagnosis Date   Acid reflux 2022   ADD (attention deficit disorder)    Anemia 08/23/2021   on 09/27/21 HGB was 10.8 (Epic)   Anxiety    Asthma    Back pain    Class 2 obesity due to excess calories without serious comorbidity with body mass index (BMI) of 39.0 to 39.9 in adult 05/26/2019   Depression    Elevated blood-pressure reading without diagnosis of hypertension 05/26/2019   Family history of breast cancer 04/28/2021   Family history of ovarian cancer 04/28/2021   Family history of pancreatic cancer 04/28/2021   Genetic testing 05/08/2021   Negative genetic testing: no pathogenic variants detected in [facility] CancerNext-Expanded +RNAinsight Panel.  The report date is May 02, 2021.  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;  Service: Gynecology;  Laterality: N/A;   DILATATION & CURRETTAGE/HYSTEROSCOPY WITH RESECTOCOPE N/A 02/03/2014   Procedure: DILATATION & CURETTAGE/HYSTEROSCOPY ;  Surgeon: Purcell Nails, MD;  Location: [facility];  Service: Gynecology;  Laterality: N/A;   DILATION AND CURETTAGE OF UTERUS  2001   ENDOMETRIAL ABLATION N/A 10/12/2021   Procedure: ENDOMETRIAL ABLATION;  Surgeon: Osborn Coho, MD;  Location: [facility];  Service: Gynecology;  Laterality: N/A;   ESOPHAGOGASTRODUODENOSCOPY N/A 07/25/2021   Procedure: ESOPHAGOGASTRODUODENOSCOPY (EGD);  Surgeon: Quentin Ore, MD;  Location: [facility];  Service: General;  Laterality: N/A;   LAMINECTOMY N/A 03/17/2021   Procedure: Laminectomy - Lumbar Four-Lumbar Five for Intradural mass;  Surgeon: Donalee Citrin, MD;  Location: [facility];  Service: Neurosurgery;  Laterality: N/A;  3C   LAPAROSCOPIC GASTRIC SLEEVE RESECTION  09/26/2021   MYOMECTOMY N/A 02/03/2014   Procedure: MYOMECTOMY ;  Surgeon: Purcell Nails, MD;  Location: [facility];  Service: Gynecology;  Laterality: N/A;   SALPINGECTOMY  2016   TONSILLECTOMY     age 67   TUBAL LIGATION N/A 08/19/2015   Procedure: POST PARTUM TUBAL LIGATION;  Surgeon: Osborn Coho, MD;  Location: [facility];  Service: Gynecology;  Laterality: N/A;   UPPER GI ENDOSCOPY N/A 09/26/2021   Procedure: UPPER GI ENDOSCOPY;   Surgeon: Quentin Ore, MD;  Location: [facility];  Service: General;  Laterality: N/A;   WISDOM TOOTH EXTRACTION     age 32    Prior to Admission medications   Medication Sig Start Date End Date Taking? Authorizing Provider  DULoxetine (CYMBALTA) 60 MG capsule Take 60 mg by mouth at bedtime. 03/07/21  Yes [provider]  MYFEMBREE 40-1-0.5 MG TABS Take 1 tablet by mouth daily. 04/19/23  Yes [provider]  albuterol (PROVENTIL) (5 MG/ML) 0.5% nebulizer solution Take 0.5 mLs (2.5 mg total) by nebulization every 6 (six) hours as needed for wheezing or shortness of breath. 10/13/22   Edwin Dada P, DO  albuterol (VENTOLIN HFA) 108 (90 Base) MCG/ACT inhaler Inhale 1-2 puffs into the lungs every 6 (six) hours as needed for wheezing or shortness of breath.    [provider]  budesonide-formoterol (SYMBICORT) 80-4.5 MCG/ACT inhaler Inhale 2 puffs into the lungs in the morning and at bedtime. 10/13/22   Edwin Dada P, DO  lisdexamfetamine (VYVANSE) 50 MG capsule Take 50 mg by mouth every morning. No longer on per patient Patient not taking: Reported on 12/16/2023 09/16/23   [provider]  pantoprazole (PROTONIX) 40 MG tablet Take 40 mg by mouth daily. Patient not taking: Reported on 12/19/2023 11/13/22   [provider]  propranolol (INDERAL) 40 MG tablet Take 1 tablet (40 mg total) by mouth at bedtime. Patient not taking: Reported on 12/12/2023 11/20/23   Henreitta Leber, MD  zolpidem (AMBIEN) 10 MG tablet Take 10 mg by mouth at bedtime. Patient not taking: Reported on 12/03/2023 12/05/21   [provider]    Allergies as of 12/09/2023 - Review Complete 12/03/2023  Allergen Reaction Noted   Ceftriaxone Hives and Rash 11/14/2007   Diclofenac Rash 10/07/2018   Fluconazole Hives 02/01/2011   Pineapple Shortness Of Breath 01/26/2014   Latex Rash 03/28/2012   Bactrim [sulfamethoxazole-trimethoprim] Hives 08/19/2018    Family History   Problem Relation Age of Onset   Hypertension Mother    Diabetes Mother    Mental illness Mother    Hyperlipidemia Mother    Stroke Mother    Depression Mother    Anxiety disorder Mother    Bipolar disorder Mother    Liver disease Mother    Sleep apnea Mother    Obesity Mother    Hypertension Father    Asthma Brother    Seizures Brother    Pancreatic cancer Maternal Aunt 36   Lung cancer Maternal Aunt        dx after 50; smoking hx   Liver cancer Maternal Uncle        dx after 17   Breast cancer Paternal Aunt        two paternal aunts, age 54s-40s   Ovarian cancer Paternal Aunt        two paternal aunts, reported BRCA mutation   Colon polyps Paternal Uncle        two paternal uncles; more than 10 lifetime polyps   Heart disease Maternal Grandmother    Hypertension Maternal Grandmother    Breast cancer  Maternal Grandmother        dx 30s-40s; d. 2   Ovarian cancer Paternal Grandmother    Ovarian cancer Other        PGM's mother   Breast cancer Cousin        dx 49s   Ovarian cancer Cousin        dx 53s    Social History   Socioeconomic History   Marital status: Divorced    Spouse name: Not on file   Number of children: Not on file   Years of education: Not on file   Highest education level: Not on file  Occupational History   Not on file  Tobacco Use   Smoking status: Never   Smokeless tobacco: Never  Vaping Use   Vaping status: Never Used  Substance and Sexual Activity   Alcohol use: Never   Drug use: No   Sexual activity: Yes    Birth control/protection: None  Other Topics Concern   Not on file  Social History Narrative   Not on file   Social Drivers of Health   Financial Resource Strain: Not on file  Food Insecurity: Not on file  Transportation Needs: Not on file  Physical Activity: Not on file  Stress: Not on file  Social Connections: Not on file  Intimate Partner Violence: Not on file    Review of Systems: See HPI, otherwise negative  ROS  Physical Exam: Vital signs in last 24 hours: Temp:  [98.9 F (37.2 C)] 98.9 F (37.2 C) (02/27 1114) Pulse Rate:  [87] 87 (02/27 1114) Resp:  [23] 23 (02/27 1114) BP: (147)/(106) 147/106 (02/27 1114) SpO2:  [99 %] 99 % (02/27 1114) Weight:  [096 kg] 103 kg (02/27 1114)   General:   Alert,  Well-developed, well-nourished, pleasant and cooperative in NAD Head:  Normocephalic and atraumatic. Eyes:  Sclera clear, no icterus.   Conjunctiva pink. Ears:  Normal auditory acuity. Nose:  No deformity, discharge,  or lesions. Msk:  Symmetrical without gross deformities. Normal posture. Extremities:  Without clubbing or edema. Neurologic:  Alert and  oriented x4;  grossly normal neurologically. Skin:  Intact without significant lesions or rashes. Psych:  Alert and cooperative. Normal mood and affect.  Impression/Plan:   Shannon [name] is a 43 y.o. female is here for a colonoscopy for colon cancer screening purposes.  High risk family history for CRC   The risks of the procedure including infection, bleed, or perforation as well as benefits, limitations, alternatives and imponderables have been reviewed with the patient. Questions have been answered. All parties agreeable.

## 2023-12-19 NOTE — Anesthesia Postprocedure Evaluation (Signed)
 Anesthesia Post Note  Patient: Shannon [name]  Procedure(s) Performed: COLONOSCOPY WITH PROPOFOL  Patient location during evaluation: PACU Anesthesia Type: General Level of consciousness: awake and alert Pain management: pain level controlled Vital Signs Assessment: post-procedure vital signs reviewed and stable Respiratory status: spontaneous breathing, nonlabored ventilation, respiratory function stable and patient connected to nasal cannula oxygen Cardiovascular status: blood pressure returned to baseline and stable Postop Assessment: no apparent nausea or vomiting Anesthetic complications: no   There were no known notable events for this encounter.   Last Vitals:  Vitals:   12/19/23 1114 12/19/23 1243  BP: (!) 147/106 (!) 128/94  Pulse: 87 83  Resp: (!) 23 (!) 25  Temp: 37.2 C 36.7 C  SpO2: 99% 99%    Last Pain:  Vitals:   12/19/23 1243  TempSrc: Oral  PainSc:                  Gaetano Hawthorne

## 2023-12-19 NOTE — Op Note (Signed)
 [facility] Patient Name: Shannon [name] Procedure Date: 12/19/2023 11:23 AM MRN: [id number] Date of Birth: 09/20/1981 Attending MD: Sanjuan Dame , MD, [id number] CSN: [id number] Age: 43 Admit Type: Outpatient Procedure:                Colonoscopy Indications:              Screening patient at increased risk: Colorectal                            cancer in multiple 1st-degree relatives before age                            27 years, Screening in patient at increased risk:                            Colorectal cancer in father 35 or older, Screening                            in patient at increased risk: Colorectal cancer in                            brother before age 10 Providers:                Sanjuan Dame, MD, Francoise Ceo RN, RN, Dyann Ruddle Referring MD:              Medicines:                Monitored Anesthesia Care Complications:            No immediate complications. Estimated Blood Loss:     Estimated blood loss: none. Procedure:                Pre-Anesthesia Assessment:                           - Prior to the procedure, a History and Physical                            was performed, and patient medications and                            allergies were reviewed. The patient's tolerance of                            previous anesthesia was also reviewed. The risks                            and benefits of the procedure and the sedation                            options and risks were discussed with the patient.                            All questions were answered, and informed consent  was obtained. Prior Anticoagulants: The patient has                            taken no anticoagulant or antiplatelet agents. ASA                            Grade Assessment: II - A patient with mild systemic                            disease. After reviewing the risks and benefits,                            the patient was deemed in satisfactory  condition to                            undergo the procedure.                           After obtaining informed consent, the colonoscope                            was passed under direct vision. Throughout the                            procedure, the patient's blood pressure, pulse, and                            oxygen saturations were monitored continuously. The                            [id number]) scope was introduced through the                            anus and advanced to the the cecum, identified by                            appendiceal orifice and ileocecal valve. The                            colonoscopy was performed without difficulty. The                            patient tolerated the procedure well. The quality                            of the bowel preparation was evaluated using the                            BBPS [facility] Bowel Preparation Scale) with scores                            of: Right Colon = 3, Transverse Colon = 3 and Left  Colon = 3 (entire mucosa seen well with no residual                            staining, small fragments of stool or opaque                            liquid). The total BBPS score equals 9. The                            ileocecal valve, appendiceal orifice, and rectum                            were photographed. Scope In: 12:19:13 PM Scope Out: 12:39:17 PM Scope Withdrawal Time: 0 hours 10 minutes 45 seconds  Total Procedure Duration: 0 hours 20 minutes 4 seconds  Findings:      Non-bleeding internal hemorrhoids were found during retroflexion. The       hemorrhoids were small.      The exam was otherwise without abnormality. Impression:               - Non-bleeding internal hemorrhoids.                           - The examination was otherwise normal.                           - No specimens collected. Moderate Sedation:      Per Anesthesia Care Recommendation:           - Patient has a  contact number available for                            emergencies. The signs and symptoms of potential                            delayed complications were discussed with the                            patient. Return to normal activities tomorrow.                            Written discharge instructions were provided to the                            patient.                           - Resume previous diet.                           - Continue present medications.                           - Repeat colonoscopy in 5 years for screening                            purposes.                           -  Return to primary care physician as previously                            scheduled given high risk family history of colon                            cancer. Procedure Code(s):        --- Professional ---                           Z6109, Colorectal cancer screening; colonoscopy on                            individual at high risk Diagnosis Code(s):        --- Professional ---                           K64.8, Other hemorrhoids                           Z80.0, Family history of malignant neoplasm of                            digestive organs CPT copyright 2022 [employer]. All rights reserved. The codes documented in this report are preliminary and upon coder review may  be revised to meet current compliance requirements. Sanjuan Dame, MD Sanjuan Dame, MD 12/19/2023 12:43:33 PM This report has been signed electronically. Number of Addenda: 0

## 2023-12-20 ENCOUNTER — Encounter (HOSPITAL_COMMUNITY): Payer: Self-pay | Admitting: Gastroenterology

## 2024-01-28 ENCOUNTER — Encounter: Payer: Self-pay | Admitting: Family Medicine

## 2024-01-28 ENCOUNTER — Ambulatory Visit (INDEPENDENT_AMBULATORY_CARE_PROVIDER_SITE_OTHER): Payer: 59 | Admitting: Family Medicine

## 2024-01-28 VITALS — BP 137/90 | HR 92 | Temp 98.5°F | Ht 64.5 in | Wt 232.0 lb

## 2024-01-28 DIAGNOSIS — Z9884 Bariatric surgery status: Secondary | ICD-10-CM | POA: Diagnosis not present

## 2024-01-28 DIAGNOSIS — J4520 Mild intermittent asthma, uncomplicated: Secondary | ICD-10-CM

## 2024-01-28 DIAGNOSIS — F419 Anxiety disorder, unspecified: Secondary | ICD-10-CM

## 2024-01-28 DIAGNOSIS — E559 Vitamin D deficiency, unspecified: Secondary | ICD-10-CM

## 2024-01-28 DIAGNOSIS — F331 Major depressive disorder, recurrent, moderate: Secondary | ICD-10-CM | POA: Diagnosis not present

## 2024-01-28 DIAGNOSIS — E7800 Pure hypercholesterolemia, unspecified: Secondary | ICD-10-CM

## 2024-01-28 DIAGNOSIS — I10 Essential (primary) hypertension: Secondary | ICD-10-CM

## 2024-01-28 DIAGNOSIS — H539 Unspecified visual disturbance: Secondary | ICD-10-CM

## 2024-01-28 NOTE — Progress Notes (Signed)
 Subjective:  Patient ID: Shannon [name], female    DOB: 1981/04/11, 43 y.o.   MRN: [id number]  Patient Care Team: Arrie Senate, FNP as PCP - General (Family Medicine) Jodelle Red, MD as PCP - Cardiology (Cardiology) Stechschulte, Hyman Hopes, MD as Consulting Physician (Surgery)   Chief Complaint:  Medical Management of Chronic Issues (3 month follow up/Concern for rising B/P)   HPI: Shannon [name] is a 43 y.o. female presenting on 01/28/2024 for Medical Management of Chronic Issues (3 month follow up/Concern for rising B/P)  HPI 1. Moderate episode of recurrent major depressive disorder (HCC)/2. Anxiety States that depression and anxiety are going well. She states that since she started tumeric and ginger shot, she has better mood. She is no longer taking ambien and believe that it helping her not be as drowsy in the mornings. Denies thoughts of self harm. Would like to continue cymbalta.   3. Morbid obesity (HCC) She lost 30 lbs with weight loss surgery. States that she is currently at a standstill. She is walking 30 minutes per day. She is drinking ginger root and tumeric shot on empty stomach every morning. Diet consist of Low calorie snacks, cheese, PB pretzels, grilled chicken salad or [place], fish, chicken with vegetables. Drinking only water. Recently stopped vyvanse.   4. Pure hypercholesterolemia She is working on diet and lifestyle modifications. She did not want to start fish oil or RYR due to concern for side effects.   5. Primary hypertension Has BP monitor at home Yes BP at home average - states that she is having it monitor at work. Reports that it is 130/90-100. States that it has been "running high" and with the blurry vision she is concerned ROS Denies anxiety, fatigue, chest pain, headaches, palpitations, sweats, SOB, PND, orthopnea Endorses Changes to vision, feet swelling Meds -currently nothing, previously taking propranolol   CAD risks  hypertension Of note, reports that she has been following up with eye doctor. Reports that she has blurry vision. Wears contacts, tried month in glasses and continues to have blurriness.   6. Asthma  States that she is not having any symptoms.  She has not had any issues with change in season.  Has not had to use inhaler in 6 months   7. GERD  States that she had acid reflux after weight loss surgery. She has tried to discontinue PPI without success. She completed colonoscopy with GI. They did not complete EGD.  States that without medication she will cough until she is coughing up blood. Reports choking and acid in the back of her throat without medication.    Relevant past medical, surgical, family, and social history reviewed and updated as indicated.  Allergies and medications reviewed and updated. Data reviewed: Chart in Epic.   Past Medical History:  Diagnosis Date   Acid reflux 2022   ADD (attention deficit disorder)    Anemia 08/23/2021   on 09/27/21 HGB was 10.8 (Epic)   Anxiety    Asthma    Back pain    Class 2 obesity due to excess calories without serious comorbidity with body mass index (BMI) of 39.0 to 39.9 in adult 05/26/2019   Depression    Elevated blood-pressure reading without diagnosis of hypertension 05/26/2019   Family history of breast cancer 04/28/2021   Family history of ovarian cancer 04/28/2021   Family history of pancreatic cancer 04/28/2021   Genetic testing 05/08/2021   Negative genetic testing: no pathogenic variants detected in [employer]  CancerNext-Expanded +RNAinsight Panel.  The report date is May 02, 2021.  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   Pre-eclampsia in postpartum period 08/26/2015   Susceptible to varicella (non-immune), currently pregnant 08/18/2015   SVD (spontaneous vaginal delivery) 08/18/2015   Vitamin D deficiency    Wears contact lenses    Wears glasses     Past Surgical History:  Procedure Laterality Date   BACK SURGERY     BIOPSY  07/25/2021   Procedure: BIOPSY;  Surgeon: Quentin Ore, MD;  Location: [facility];  Service: General;;   COLONOSCOPY WITH PROPOFOL N/A 12/19/2023   Procedure: COLONOSCOPY WITH PROPOFOL;  Surgeon: Franky Macho, MD;  [facility];  Service: Endoscopy;  Laterality: N/A;  1:00PM;ASA 1-2   DILATATION & CURETTAGE/HYSTEROSCOPY WITH MYOSURE N/A 10/12/2021   Procedure: DILATATION & CURETTAGE/HYSTEROSCOPY;  Surgeon: Osborn Coho, MD;  Location: [facility];  Service: Gynecology;  Laterality: N/A;   DILATATION & CURRETTAGE/HYSTEROSCOPY WITH RESECTOCOPE N/A 02/03/2014   Procedure: DILATATION & CURETTAGE/HYSTEROSCOPY ;  Surgeon: Purcell Nails, MD;  Location: [facility];  Service: Gynecology;  Laterality: N/A;   DILATION AND CURETTAGE OF UTERUS  2001   ENDOMETRIAL ABLATION N/A 10/12/2021   Procedure: ENDOMETRIAL ABLATION;  Surgeon: Osborn Coho, MD;  Location[facility];  Service: Gynecology;  Laterality: N/A;   ESOPHAGOGASTRODUODENOSCOPY N/A 07/25/2021   Procedure: ESOPHAGOGASTRODUODENOSCOPY (EGD);  Surgeon: Quentin Ore, MD;  Location: [facility];  Service: General;  Laterality: N/A;   LAMINECTOMY N/A 03/17/2021   Procedure: Laminectomy - Lumbar Four-Lumbar Five for Intradural mass;  Surgeon: Donalee Citrin, MD;  Location: [facility];  Service: Neurosurgery;  Laterality: N/A;  3C   LAPAROSCOPIC GASTRIC SLEEVE RESECTION  09/26/2021    MYOMECTOMY N/A 02/03/2014   Procedure: MYOMECTOMY ;  Surgeon: Purcell Nails, MD;  Location: [facility];  Service: Gynecology;  Laterality: N/A;   SALPINGECTOMY  2016   TONSILLECTOMY     age 34   TUBAL LIGATION N/A 08/19/2015   Procedure: POST PARTUM TUBAL LIGATION;  Surgeon: Osborn Coho, MD;  Location: [facility];  Service: Gynecology;  Laterality: N/A;   UPPER GI ENDOSCOPY N/A 09/26/2021   Procedure: UPPER GI ENDOSCOPY;  Surgeon: Quentin Ore, MD;  Location: [facility];  Service: General;  Laterality: N/A;   WISDOM TOOTH EXTRACTION     age 65    Social History   Socioeconomic History   Marital status: Divorced    Spouse name: Not on file   Number of children: Not on file   Years of education: Not on file   Highest education level: Not on file  Occupational History   Not on file  Tobacco Use   Smoking status: Never   Smokeless tobacco: Never  Vaping Use   Vaping status: Never Used  Substance and Sexual Activity   Alcohol use: Never   Drug use: No   Sexual activity: Yes    Birth control/protection: None  Other Topics Concern  Not on file  Social History Narrative   Not on file   Social Drivers of Health   Financial Resource Strain: Not on file  Food Insecurity: Not on file  Transportation Needs: Not on file  Physical Activity: Not on file  Stress: Not on file  Social Connections: Not on file  Intimate Partner Violence: Not on file    Outpatient Encounter Medications as of 01/28/2024  Medication Sig   albuterol (PROVENTIL) (5 MG/ML) 0.5% nebulizer solution Take 0.5 mLs (2.5 mg total) by nebulization every 6 (six) hours as needed for wheezing or shortness of breath.   albuterol (VENTOLIN HFA) 108 (90 Base) MCG/ACT inhaler Inhale 1-2 puffs into the lungs every 6 (six) hours as needed for wheezing or shortness of breath.   budesonide-formoterol (SYMBICORT) 80-4.5 MCG/ACT inhaler Inhale 2 puffs into the lungs in the morning and at bedtime.   DULoxetine (CYMBALTA) 60  MG capsule Take 60 mg by mouth at bedtime.   MYFEMBREE 40-1-0.5 MG TABS Take 1 tablet by mouth daily.   pantoprazole (PROTONIX) 40 MG tablet Take 40 mg by mouth daily.   [DISCONTINUED] lisdexamfetamine (VYVANSE) 50 MG capsule Take 50 mg by mouth every morning. No longer on per patient   [DISCONTINUED] propranolol (INDERAL) 40 MG tablet Take 1 tablet (40 mg total) by mouth at bedtime.   [DISCONTINUED] zolpidem (AMBIEN) 10 MG tablet Take 10 mg by mouth at bedtime.   No facility-administered encounter medications on file as of 01/28/2024.   Allergies  Allergen Reactions   Ceftriaxone Hives and Rash    No SOB - patient willing to try again   Diclofenac Rash   Fluconazole Hives    Raw rash on abdomen and she had sores and swelling in mouth     Pineapple Shortness Of Breath   Latex Rash   Bactrim [Sulfamethoxazole-Trimethoprim] Hives   Review of Systems As per HPI  Objective:  BP (!) 137/90   Pulse 92   Temp 98.5 F (36.9 C)   Ht 5' 4.5" (1.638 m)   Wt 232 lb (105.2 kg)   SpO2 96%   BMI 39.21 kg/m    Wt Readings from Last 3 Encounters:  01/28/24 232 lb (105.2 kg)  12/19/23 227 lb (103 kg)  12/16/23 233 lb 3.2 oz (105.8 kg)   Physical Exam Constitutional:      General: She is awake. She is not in acute distress.    Appearance: Normal appearance. She is well-developed and well-groomed. She is obese. She is not ill-appearing, toxic-appearing or diaphoretic.  Cardiovascular:     Rate and Rhythm: Normal rate and regular rhythm.     Pulses: Normal pulses.          Radial pulses are 2+ on the right side and 2+ on the left side.       Posterior tibial pulses are 2+ on the right side and 2+ on the left side.     Heart sounds: Normal heart sounds. No murmur heard.    No gallop.  Pulmonary:     Effort: Pulmonary effort is normal. No respiratory distress.     Breath sounds: Normal breath sounds. No stridor. No wheezing, rhonchi or rales.  Musculoskeletal:     Cervical back: Full  passive range of motion without pain and neck supple.     Right lower leg: No edema.     Left lower leg: No edema.  Skin:    General: Skin is warm.     Capillary Refill: Capillary  refill takes less than 2 seconds.  Neurological:     General: No focal deficit present.     Mental Status: She is alert, oriented to person, place, and time and easily aroused. Mental status is at baseline.     GCS: GCS eye subscore is 4. GCS verbal subscore is 5. GCS motor subscore is 6.     Motor: No weakness.  Psychiatric:        Attention and Perception: Attention and perception normal.        Mood and Affect: Mood and affect normal.        Speech: Speech normal.        Behavior: Behavior normal. Behavior is cooperative.        Thought Content: Thought content normal. Thought content does not include homicidal or suicidal ideation. Thought content does not include homicidal or suicidal plan.        Cognition and Memory: Cognition and memory normal.        Judgment: Judgment normal.     Results for orders placed or performed during the hospital encounter of 12/19/23  Pregnancy, urine POC   Collection Time: 12/19/23 11:33 AM  Result Value Ref Range   Preg Test, Ur NEGATIVE NEGATIVE       01/28/2024    3:47 PM 10/30/2023    3:30 PM 07/16/2023    8:55 AM 05/14/2023    9:22 AM 08/25/2021    2:32 PM  Depression screen PHQ 2/9  Decreased Interest 0 1 2 3 1   Down, Depressed, Hopeless 0 1 1 3 1   PHQ - 2 Score 0 2 3 6 2   Altered sleeping 0 3 2 3    Tired, decreased energy 0 1 2 3    Change in appetite 0 2 2 3    Feeling bad or failure about yourself  0 0 0 0   Trouble concentrating 0 2 3 3    Moving slowly or fidgety/restless 0 0 2 2   Suicidal thoughts 0 0 0 0   PHQ-9 Score 0 10 14 20    Difficult doing work/chores Somewhat difficult Somewhat difficult Very difficult Extremely dIfficult        01/28/2024    3:47 PM 10/30/2023    3:30 PM 07/16/2023    8:55 AM 05/14/2023    9:22 AM  GAD 7 : Generalized  Anxiety Score  Nervous, Anxious, on Edge 1 1 2 3   Control/stop worrying 1 0 0 3  Worry too much - different things 1 1 1 3   Trouble relaxing 1 1 1 3   Restless 1 1 1 1   Easily annoyed or irritable 1 1 1 3   Afraid - awful might happen 0 0 0 0  Total GAD 7 Score 6 5 6 16   Anxiety Difficulty Not difficult at all Somewhat difficult Very difficult Extremely difficult    Pertinent labs & imaging results that were available during my care of the patient were reviewed by me and considered in my medical decision making.  Assessment & Plan:  Shannon [name] was seen today for medical management of chronic issues.  Diagnoses and all orders for this visit:  1. Moderate episode of recurrent major depressive disorder (HCC) (Primary) Well controlled. Denies SI. Continue current regimen.   2. Anxiety As above.   3. Morbid obesity (HCC) Discussed with patient following up with bariatrics. Will have patient reach out to insurance company to determine what they cover. Patient denies history of MEN, MTC, pancreatitis. Discussed resources for weight loss. Discussed  with patient to continue healthy lifestyle choices, including diet (rich in fruits, vegetables, and lean proteins, and low in salt and simple carbohydrates) and exercise (at least 30 minutes of moderate physical activity daily). Limit beverages high is sugar. Recommended at least 80-100 oz of water daily.   4. S/P bariatric surgery As above.   5. Pure hypercholesterolemia Does not wish to start RYR or fish oil. Continue diet and lifestyle modifications.   6. Primary hypertension Not at goal. Patient to monitor BP at home for 2 weeks and report measurements. Will review and determine next steps. Patient to continue to follow with opthalmology for vision changes.   7. Mild intermittent asthma without complication Well controlled. Continue current regimen.   8. Vitamin D deficiency Well controlled at last check. Continue current regimen.   9.  Visual changes Continue to follow with specialty. Will monitor vision changes.   Continue all other maintenance medications.  Follow up plan: Return in about 3 months (around 04/28/2024) for Chronic Condition Follow up.   Continue healthy lifestyle choices, including diet (rich in fruits, vegetables, and lean proteins, and low in salt and simple carbohydrates) and exercise (at least 30 minutes of moderate physical activity daily).  Written and verbal instructions provided   The above assessment and management plan was discussed with the patient. The patient verbalized understanding of and has agreed to the management plan. Patient is aware to call the clinic if they develop any new symptoms or if symptoms persist or worsen. Patient is aware when to return to the clinic for a follow-up visit. Patient educated on when it is appropriate to go to the emergency department.   Neale Burly, DNP-FNP [facility] [address] [place], [place] [address] [phone]

## 2024-01-28 NOTE — Patient Instructions (Addendum)
 E2M - Eager to Motivate  P:E diet  Mediterranean Diet  ChatGPT  Move with Joni Reining  Yoga with Adriene  Noom  Weight Watchers  MyFitnessPal

## 2024-02-21 ENCOUNTER — Other Ambulatory Visit: Payer: Self-pay | Admitting: Family Medicine

## 2024-02-21 ENCOUNTER — Ambulatory Visit: Payer: Self-pay

## 2024-02-21 NOTE — Telephone Encounter (Signed)
 Copied from CRM [id number]. Topic: Clinical - Medication Refill >> Feb 21, 2024  3:01 PM Brynn Caras wrote: Most Recent Primary Care Visit:  Provider: Alvina Axon  Department: [facility]  Visit Type: NURSE VISIT  Date: 09/05/2021  Medication:  DULoxetine  (CYMBALTA ) 60 MG capsule  Has the patient contacted their pharmacy? Yes (Agent: If no, request that the patient contact the pharmacy for the refill. If patient does not wish to contact the pharmacy document the reason why and proceed with request.) (Agent: If yes, when and what did the pharmacy advise?)  Is this the correct pharmacy for this prescription? Yes If no, delete pharmacy and type the correct one.  This is the patient's preferred pharmacy:  [facility] [address], [place] [address] [place] [address] [place] [address] Phone: [phone] Fax: [phone]  [facility] [address] [place] [address] Phone: [phone] Fax: [phone]  Melodee Spruce [facility] - [facility] [address] [place] [address] Phone: [phone] Fax: [phone]   Has the prescription been filled recently? No  Is the patient out of the medication? Yes  Has the patient been seen for an appointment in the last year OR does the patient have an upcoming appointment? Yes, last OV 04/08  Can we respond through MyChart? Yes  Agent: Please be advised that Rx refills may take up to 3 business days. We ask that you follow-up with your pharmacy.

## 2024-02-21 NOTE — Telephone Encounter (Signed)
 Patient called in to request a refill of Cymbalta . Patient stated she has been without this medication for 2 days. Patient stated she is starting to experience GI symptoms. Patient stated this medication was originally prescribed by another provider, but her PCP recently acquired it. Patient stated the pharmacy is waiting on the provider to authorize this medication. This RN called CAL for assistance, no answer, as it is after 5 pm. This RN advised that I would route this conversation to office for further assistance.  This RN advised patient to contact her pharmacy and see if they would be willing to do an emergency refill. This RN also advised patient that she could seek UC and see if provider would be willing to prescribe this medication. This RN provided education and instructed patient to seek emergency care if her symptoms worsen. Patient verbalized understanding.   Reason for Disposition  [1] Prescription refill request for NON-ESSENTIAL medicine (i.e., no harm to patient if med not taken) AND [2] triager unable to refill per department policy  Protocols used: Medication Refill and Renewal Call-A-AH

## 2024-02-21 NOTE — Telephone Encounter (Signed)
 Copied from CRM [id number]. Topic: Clinical - Medication Refill >> Feb 21, 2024  3:01 PM Brynn Caras wrote: Most Recent Primary Care Visit:  Provider: Alvina Axon  Department: [facility]  Visit Type: NURSE VISIT  Date: 09/05/2021  Medication:  DULoxetine  (CYMBALTA ) 60 MG capsule  Has the patient contacted their pharmacy? Yes (Agent: If no, request that the patient contact the pharmacy for the refill. If patient does not wish to contact the pharmacy document the reason why and proceed with request.) (Agent: If yes, when and what did the pharmacy advise?)  Is this the correct pharmacy for this prescription? Yes If no, delete pharmacy and type the correct one.  This is the patient's preferred pharmacy:  [facility] [address] [place] - [address] [place] [address] [place] [address] Phone: [phone] Fax: [phone]  [facility] [address] [place] [address] Phone: [phone] Fax: [phone]  Melodee Spruce LONG - [facility] [address] [place] [address] Phone: [phone] Fax: [phone]   Has the prescription been filled recently? No  Is the patient out of the medication? Yes  Has the patient been seen for an appointment in the last year OR does the patient have an upcoming appointment? Yes, last OV 04/08  Can we respond through MyChart? Yes  Agent: Please be advised that Rx refills may take up to 3 business days. We ask that you follow-up with your pharmacy. >> Feb 21, 2024  5:33 PM Tiffany H wrote: Patient called to follow up on request for Cymbalta  refill. Patient has been without medication for 2 days. Requested from pharmacy, requested via my chart - medication is currently pending. Patient has upset stomach and the beginnings of withdrawal symptoms. Please assist.

## 2024-02-24 MED ORDER — DULOXETINE HCL 60 MG PO CPEP: 60.0000 mg | ORAL_CAPSULE | Freq: Every day | ORAL | 1 refills | Status: DC

## 2024-02-24 NOTE — Telephone Encounter (Signed)
 Refilled cymbalta . Discussed at last visit.

## 2024-02-26 ENCOUNTER — Other Ambulatory Visit: Payer: Self-pay | Admitting: *Deleted

## 2024-02-26 MED ORDER — DULOXETINE HCL 60 MG PO CPEP: 60.0000 mg | ORAL_CAPSULE | Freq: Every day | ORAL | 1 refills | Status: DC

## 2024-02-26 MED ORDER — DULOXETINE HCL 60 MG PO CPEP
60.0000 mg | ORAL_CAPSULE | Freq: Every day | ORAL | 1 refills | Status: DC
Start: 2024-02-26 — End: 2024-02-26

## 2024-02-26 NOTE — Progress Notes (Signed)
 Script was set to phone in and has printed, trying to resend.

## 2024-02-26 NOTE — Addendum Note (Signed)
 Addended by: Sanders Manninen D on: 02/26/2024 09:35 AM   Modules accepted: Orders

## 2024-02-26 NOTE — Telephone Encounter (Signed)
 Refill was set to Phone In, resent to [facility]

## 2024-02-26 NOTE — Addendum Note (Signed)
 Addended by: Milia Warth D on: 02/26/2024 09:42 AM   Modules accepted: Orders

## 2024-02-27 ENCOUNTER — Encounter: Payer: Self-pay | Admitting: *Deleted

## 2024-04-15 ENCOUNTER — Encounter: Payer: Self-pay | Admitting: Family Medicine

## 2024-04-15 ENCOUNTER — Ambulatory Visit: Admitting: Family Medicine

## 2024-04-15 ENCOUNTER — Ambulatory Visit: Payer: Self-pay | Admitting: Family Medicine

## 2024-04-15 VITALS — BP 122/86 | HR 103 | Temp 97.8°F | Ht 64.5 in | Wt 229.0 lb

## 2024-04-15 DIAGNOSIS — Z8759 Personal history of other complications of pregnancy, childbirth and the puerperium: Secondary | ICD-10-CM | POA: Insufficient documentation

## 2024-04-15 DIAGNOSIS — N319 Neuromuscular dysfunction of bladder, unspecified: Secondary | ICD-10-CM

## 2024-04-15 DIAGNOSIS — I10 Essential (primary) hypertension: Secondary | ICD-10-CM

## 2024-04-15 DIAGNOSIS — E7800 Pure hypercholesterolemia, unspecified: Secondary | ICD-10-CM

## 2024-04-15 DIAGNOSIS — F331 Major depressive disorder, recurrent, moderate: Secondary | ICD-10-CM

## 2024-04-15 DIAGNOSIS — K219 Gastro-esophageal reflux disease without esophagitis: Secondary | ICD-10-CM | POA: Insufficient documentation

## 2024-04-15 DIAGNOSIS — E559 Vitamin D deficiency, unspecified: Secondary | ICD-10-CM

## 2024-04-15 DIAGNOSIS — F419 Anxiety disorder, unspecified: Secondary | ICD-10-CM | POA: Diagnosis not present

## 2024-04-15 DIAGNOSIS — J4520 Mild intermittent asthma, uncomplicated: Secondary | ICD-10-CM

## 2024-04-15 DIAGNOSIS — R7303 Prediabetes: Secondary | ICD-10-CM | POA: Insufficient documentation

## 2024-04-15 DIAGNOSIS — R102 Pelvic and perineal pain: Secondary | ICD-10-CM

## 2024-04-15 LAB — BAYER DCA HB A1C WAIVED: HB A1C (BAYER DCA - WAIVED): 5.6 % (ref 4.8–5.6)

## 2024-04-15 NOTE — Progress Notes (Signed)
 Normal A1C. Will continue to monitor.

## 2024-04-15 NOTE — Patient Instructions (Addendum)
 E2M - Eager to Motivate  P:E diet  Mediterranean Diet  ChatGPT  Move with Peterson Brandt  Yoga with Adriene  Grow with Domingo Friend  Weight Watchers

## 2024-04-15 NOTE — Progress Notes (Signed)
 Subjective:  Patient ID: Shannon [name], female    DOB: 1981/08/21, 43 y.o.   MRN: [id number]  Patient Care Team: Cathlene Marry Lenis, FNP as PCP - General (Family Medicine) Lonni Slain, MD as PCP - Cardiology (Cardiology) Stechschulte, Deward PARAS, MD as Consulting Physician (Surgery)   Chief Complaint:  No chief complaint on file.   HPI: Thresea [name] is a 43 y.o. female presenting on 04/15/2024 for No chief complaint on file.   HPI 1. Moderate episode of recurrent major depressive disorder (HCC) (Primary)/ 2. Anxiety States that things are going well. Denies SI or desire for counseling. Doing well on cymbalta .   3. Morbid obesity (HCC) S/p Bariatric surgery.  She is exercising some. Feels that she is stalling with her weight. She discussed with insurance company and asked for her surgeon to see if she could get GLP1. She has not followed up with bariatrics. She was previously seen by Lyndel, MD at [facility].   4. Pure hypercholesterolemia She is not taking RYR or fish oil. Making some dietary changes.   5. Vitamin D  deficiency She is not currently taking vitamin.  Denies numbness/tingling. Bone pain or fractures.   6. Gastroesophageal reflux disease, unspecified whether esophagitis present Current medications - protonix   Adverse side effects - none  States that she continues to have heart burn and acid reflux if she does not take medication.  Sore throat - no Voice change - no Hemoptysis - no Dysphagia or dyspepsia - no Water  brash - no Red Flags (weight loss, hematochezia, melena, weight loss, early satiety, fevers, odynophagia, or persistent vomiting) - no  7. Primary hypertension Has BP monitor at home Yes BP at home average - 120s/80s ROS Denies anxiety, fatigue, peripheral edema, chest pain, headaches, palpitations, SOB, PND, orthopnea Endorses sweating and some changes to vision. She followed up with optometry.   8. Neurogenic  bladder States that she feels like she does not feel that she completely empties her bladder. Continues to have some leakage.  Trying to re-establish with ob/gyn.   9. Pelvic pain Continues to have pelvic pain. She has been out of myfembree, which was helping her. She is trying to re-estab with obgyn.   10. Mild intermittent asthma without complication States that it is maintaining well.  She is not using her albuterol . Last use was 6 months ago.   11. Prediabetes Making some diet and lifestyle changes.  Has not followed up with A1C since having bariatric surgery.    Relevant past medical, surgical, family, and social history reviewed and updated as indicated.  Allergies and medications reviewed and updated. Data reviewed: Chart in Epic.   Past Medical History:  Diagnosis Date   Acid reflux 2022   ADD (attention deficit disorder)    Anemia 08/23/2021   on 09/27/21 HGB was 10.8 (Epic)   Anxiety    Asthma    Back pain    Class 2 obesity due to excess calories without serious comorbidity with body mass index (BMI) of 39.0 to 39.9 in adult 05/26/2019   Depression    Elevated blood-pressure reading without diagnosis of hypertension 05/26/2019   Family history of breast cancer 04/28/2021   Family history of ovarian cancer 04/28/2021   Family history of pancreatic cancer 04/28/2021   Genetic testing 05/08/2021   Negative genetic testing: no pathogenic variants detected in [facility] CancerNext-Expanded +RNAinsight Panel.  The report date is May 02, 2021.  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   Pre-eclampsia in postpartum period 08/26/2015   Susceptible to varicella (non-immune), currently pregnant 08/18/2015   SVD (spontaneous vaginal delivery) 08/18/2015   Vitamin D  deficiency    Wears contact lenses    Wears glasses     Past Surgical History:  Procedure Laterality Date   BACK SURGERY     BIOPSY  07/25/2021   Procedure: BIOPSY;  Surgeon: Lyndel Deward PARAS, MD;  Location: [facility];  Service: General;;   COLONOSCOPY WITH PROPOFOL  N/A 12/19/2023   Procedure: COLONOSCOPY WITH PROPOFOL ;  Surgeon: Cinderella Deatrice FALCON, MD;  Location: [facility];  Service: Endoscopy;  Laterality: N/A;  1:00PM;ASA 1-2   DILATATION & CURETTAGE/HYSTEROSCOPY WITH MYOSURE N/A 10/12/2021   Procedure: DILATATION & CURETTAGE/HYSTEROSCOPY;  Surgeon: Henry Slough, MD;  Location: [facility] [place];  Service: Gynecology;  Laterality: N/A;   DILATATION & CURRETTAGE/HYSTEROSCOPY WITH RESECTOCOPE N/A 02/03/2014   Procedure: DILATATION & CURETTAGE/HYSTEROSCOPY ;  Surgeon: Slough CINDERELLA Henry, MD;  Location: [facility];  Service: Gynecology;  Laterality: N/A;   DILATION AND CURETTAGE OF UTERUS  2001   ENDOMETRIAL ABLATION N/A 10/12/2021   Procedure: ENDOMETRIAL ABLATION;  Surgeon: Henry Slough, MD;  Location: [facility];  Service: Gynecology;  Laterality: N/A;   ESOPHAGOGASTRODUODENOSCOPY N/A 07/25/2021   Procedure: ESOPHAGOGASTRODUODENOSCOPY (EGD);  Surgeon: Lyndel Deward PARAS, MD;  Location: [facility];  Service: General;  Laterality: N/A;   LAMINECTOMY N/A 03/17/2021   Procedure: Laminectomy - Lumbar Four-Lumbar Five for Intradural mass;  Surgeon: Onetha Kuba, MD;  Location: [facility];  Service: Neurosurgery;  Laterality: N/A;  3C   LAPAROSCOPIC GASTRIC SLEEVE RESECTION  09/26/2021   MYOMECTOMY N/A 02/03/2014   Procedure: MYOMECTOMY ;  Surgeon: Slough CINDERELLA Henry, MD;  Location:  [facility];  Service: Gynecology;  Laterality: N/A;   SALPINGECTOMY  2016   TONSILLECTOMY     age 59   TUBAL LIGATION N/A 08/19/2015   Procedure: POST PARTUM TUBAL LIGATION;  Surgeon: Slough Henry, MD;  Location: [facility];  Service: Gynecology;  Laterality: N/A;   UPPER GI ENDOSCOPY N/A 09/26/2021   Procedure: UPPER GI ENDOSCOPY;  Surgeon: Lyndel Deward PARAS, MD;  Location: [facility];  Service: General;  Laterality: N/A;   WISDOM TOOTH EXTRACTION     age 7    Social History   Socioeconomic History   Marital status: Divorced    Spouse name: Not on file   Number of children: Not on file   Years of education: Not on file   Highest education level: Not on file  Occupational History   Not on file  Tobacco Use   Smoking status: Never   Smokeless tobacco: Never  Vaping Use   Vaping status: Never Used  Substance and Sexual Activity   Alcohol use: Never   Drug use: No   Sexual activity: Yes    Birth control/protection: None  Other Topics Concern   Not on file  Social History Narrative   Not on file   Social Drivers of Health  Financial Resource Strain: Not on file  Food Insecurity: No Food Insecurity (01/28/2024)   Hunger Vital Sign    Worried About Running Out of Food in the Last Year: Never true    Ran Out of Food in the Last Year: Never true  Transportation Needs: No Transportation Needs (01/28/2024)   PRAPARE - Administrator, Civil Service (Medical): No    Lack of Transportation (Non-Medical): No  Physical Activity: Not on file  Stress: Not on file  Social Connections: Not on file  Intimate Partner Violence: Not At Risk (01/28/2024)   Humiliation, Afraid, Rape, and Kick questionnaire    Fear of Current or Ex-Partner: No    Emotionally Abused: No    Physically Abused: No    Sexually Abused: No    Outpatient Encounter Medications as of 04/15/2024  Medication Sig   albuterol  (PROVENTIL ) (5 MG/ML) 0.5% nebulizer solution Take 0.5 mLs (2.5 mg total) by  nebulization every 6 (six) hours as needed for wheezing or shortness of breath.   albuterol  (VENTOLIN  HFA) 108 (90 Base) MCG/ACT inhaler Inhale 1-2 puffs into the lungs every 6 (six) hours as needed for wheezing or shortness of breath.   budesonide -formoterol  (SYMBICORT ) 80-4.5 MCG/ACT inhaler Inhale 2 puffs into the lungs in the morning and at bedtime.   DULoxetine  (CYMBALTA ) 60 MG capsule Take 1 capsule (60 mg total) by mouth daily.   MYFEMBREE 40-1-0.5 MG TABS Take 1 tablet by mouth daily.   pantoprazole  (PROTONIX ) 40 MG tablet Take 40 mg by mouth daily.   No facility-administered encounter medications on file as of 04/15/2024.    Allergies  Allergen Reactions   Ceftriaxone Hives and Rash    No SOB - patient willing to try again   Diclofenac Rash   Fluconazole  Hives    Raw rash on abdomen and she had sores and swelling in mouth     Pineapple Shortness Of Breath   Latex Rash   Bactrim  [Sulfamethoxazole -Trimethoprim ] Hives    Review of Systems As per HPI  Objective:  BP 122/86   Pulse (!) 103   Temp 97.8 F (36.6 C)   Ht 5' 4.5 (1.638 m)   Wt 229 lb (103.9 kg)   SpO2 97%   BMI 38.70 kg/m    Wt Readings from Last 3 Encounters:  04/15/24 229 lb (103.9 kg)  01/28/24 232 lb (105.2 kg)  12/19/23 227 lb (103 kg)   Physical Exam Constitutional:      General: She is awake. She is not in acute distress.    Appearance: Normal appearance. She is well-developed and well-groomed. She is obese. She is not ill-appearing, toxic-appearing or diaphoretic.   Cardiovascular:     Rate and Rhythm: Regular rhythm. Tachycardia present.     Pulses: Normal pulses.          Radial pulses are 2+ on the right side and 2+ on the left side.       Posterior tibial pulses are 2+ on the right side and 2+ on the left side.     Heart sounds: Normal heart sounds. No murmur heard.    No gallop.  Pulmonary:     Effort: Pulmonary effort is normal. No respiratory distress.     Breath sounds: Normal  breath sounds. No stridor. No wheezing, rhonchi or rales.   Musculoskeletal:     Cervical back: Full passive range of motion without pain and neck supple.     Right lower leg: No edema.  Left lower leg: No edema.   Skin:    General: Skin is warm.     Capillary Refill: Capillary refill takes less than 2 seconds.   Neurological:     General: No focal deficit present.     Mental Status: She is alert, oriented to person, place, and time and easily aroused. Mental status is at baseline.     GCS: GCS eye subscore is 4. GCS verbal subscore is 5. GCS motor subscore is 6.     Motor: No weakness.   Psychiatric:        Attention and Perception: Attention and perception normal.        Mood and Affect: Mood and affect normal.        Speech: Speech normal.        Behavior: Behavior normal. Behavior is cooperative.        Thought Content: Thought content normal. Thought content does not include homicidal or suicidal ideation. Thought content does not include homicidal or suicidal plan.        Cognition and Memory: Cognition and memory normal.        Judgment: Judgment normal.     Results for orders placed or performed during the hospital encounter of 12/19/23  Pregnancy, urine POC   Collection Time: 12/19/23 11:33 AM  Result Value Ref Range   Preg Test, Ur NEGATIVE NEGATIVE       04/15/2024    2:50 PM 01/28/2024    3:47 PM 10/30/2023    3:30 PM 07/16/2023    8:55 AM 05/14/2023    9:22 AM  Depression screen PHQ 2/9  Decreased Interest 0 0 1 2 3   Down, Depressed, Hopeless 0 0 1 1 3   PHQ - 2 Score 0 0 2 3 6   Altered sleeping 0 0 3 2 3   Tired, decreased energy 0 0 1 2 3   Change in appetite 0 0 2 2 3   Feeling bad or failure about yourself  0 0 0 0 0  Trouble concentrating 1 0 2 3 3   Moving slowly or fidgety/restless 0 0 0 2 2  Suicidal thoughts 0 0 0 0 0  PHQ-9 Score 1 0 10 14 20   Difficult doing work/chores Somewhat difficult Somewhat difficult Somewhat difficult Very difficult  Extremely dIfficult       04/15/2024    2:50 PM 01/28/2024    3:47 PM 10/30/2023    3:30 PM 07/16/2023    8:55 AM  GAD 7 : Generalized Anxiety Score  Nervous, Anxious, on Edge 0 1 1 2   Control/stop worrying 0 1 0 0  Worry too much - different things 0 1 1 1   Trouble relaxing 0 1 1 1   Restless 0 1 1 1   Easily annoyed or irritable 0 1 1 1   Afraid - awful might happen 0 0 0 0  Total GAD 7 Score 0 6 5 6   Anxiety Difficulty Not difficult at all Not difficult at all Somewhat difficult Very difficult   Pertinent labs & imaging results that were available during my care of the patient were reviewed by me and considered in my medical decision making.  Assessment & Plan:  Cylee was seen today for medical management of chronic issues.  Diagnoses and all orders for this visit:  1. Moderate episode of recurrent major depressive disorder (HCC) (Primary) Well controlled. Continue current regimen.  Denies SI. Declines counseling. Safety contract established.   2. Anxiety As above.   3. Morbid obesity (HCC) Discussed  resources for weight loss. Will refer patient as below for further evaluation.  - Amb Ref to Medical Weight Management  4. Pure hypercholesterolemia Discussed ASCVD risk score. Does not need statin at this time. Discussed Red yeast rice and fish oil.   5. Vitamin D  deficiency Stable at last lab check 10/30/2023.  Will repeat on future labs.   6. Gastroesophageal reflux disease, unspecified whether esophagitis present Stable. Discussed with patient a trial to stop PPI. She does not wish to stop at this time. Discussed red flag symptoms.   7. Primary hypertension Well controlled. Continue current regimen.   8. Neurogenic bladder Patient is planning to follow up with ob.gyn. Patient to notify provider if she cannot get in and needs a new referral   9. Pelvic pain As above.   10. Mild intermittent asthma without complication Well controlled. Continue current regimen.   11.  Prediabetes Labs as below. Will communicate results to patient once available. Will await results to determine next steps.  - Bayer DCA Hb A1c Waived   Continue all other maintenance medications.  Follow up plan: Return in about 6 months (around 10/15/2024) for Chronic Condition Follow up.   Continue healthy lifestyle choices, including diet (rich in fruits, vegetables, and lean proteins, and low in salt and simple carbohydrates) and exercise (at least 30 minutes of moderate physical activity daily).  Written and verbal instructions provided   The above assessment and management plan was discussed with the patient. The patient verbalized understanding of and has agreed to the management plan. Patient is aware to call the clinic if they develop any new symptoms or if symptoms persist or worsen. Patient is aware when to return to the clinic for a follow-up visit. Patient educated on when it is appropriate to go to the emergency department.   Marry Kins, DNP-FNP [facility] [address] [place], [place] [address] [phone]

## 2024-04-20 ENCOUNTER — Other Ambulatory Visit: Payer: Self-pay | Admitting: Family Medicine

## 2024-04-28 ENCOUNTER — Ambulatory Visit: Admitting: Family Medicine

## 2024-04-30 ENCOUNTER — Encounter (HOSPITAL_COMMUNITY): Payer: Self-pay | Admitting: *Deleted

## 2024-05-18 ENCOUNTER — Encounter (INDEPENDENT_AMBULATORY_CARE_PROVIDER_SITE_OTHER): Payer: Self-pay

## 2024-05-27 ENCOUNTER — Ambulatory Visit (INDEPENDENT_AMBULATORY_CARE_PROVIDER_SITE_OTHER): Admitting: Nurse Practitioner

## 2024-05-27 ENCOUNTER — Encounter (INDEPENDENT_AMBULATORY_CARE_PROVIDER_SITE_OTHER): Payer: Self-pay | Admitting: Nurse Practitioner

## 2024-05-27 VITALS — BP 139/100 | HR 89 | Temp 98.7°F | Ht 65.0 in | Wt 222.0 lb

## 2024-05-27 DIAGNOSIS — F331 Major depressive disorder, recurrent, moderate: Secondary | ICD-10-CM

## 2024-05-27 DIAGNOSIS — C729 Malignant neoplasm of central nervous system, unspecified: Secondary | ICD-10-CM

## 2024-05-27 DIAGNOSIS — E66812 Obesity, class 2: Secondary | ICD-10-CM

## 2024-05-27 DIAGNOSIS — R7303 Prediabetes: Secondary | ICD-10-CM

## 2024-05-27 DIAGNOSIS — J4520 Mild intermittent asthma, uncomplicated: Secondary | ICD-10-CM | POA: Diagnosis not present

## 2024-05-27 DIAGNOSIS — Z903 Acquired absence of stomach [part of]: Secondary | ICD-10-CM

## 2024-05-27 DIAGNOSIS — E7800 Pure hypercholesterolemia, unspecified: Secondary | ICD-10-CM

## 2024-05-27 DIAGNOSIS — Z6837 Body mass index (BMI) 37.0-37.9, adult: Secondary | ICD-10-CM

## 2024-05-27 NOTE — Progress Notes (Signed)
 [address] [place], [place], [place] [address] Office: [phone]  /  Fax: [phone]   Initial Consultation    Shannon [name] was seen in clinic today to evaluate for obesity. She is interested in losing weight to improve overall health and reduce the risk of weight related complications. She presents today to review program treatment options, initial physical assessment, and evaluation.    Shannon [name] is a 43 year old female who presents today to discuss possible weight management through healthy weight and wellness program.  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  She has been on phentermine  multiple times in the past and would lose weight but regained once she stopped the medication.  She is currently on phentermine  through her PCP which she started 2 days ago.  She has been trying to eat healthier options as well as portion control.  She is walking 30 minutes twice a week.  She does have a history of whitecoat hypertension which has been evaluated through her PCP states  home BPs are always normal in the 120s over 80s.  Not currently on any medication.  She does have a history of hyperlipidemia but is not currently on any medication is trying to limit saturated fats.  She also has a history of prediabetes and is not currently on any medication.  She also notes a history of asthma but states it is currently well-controlled does have an albuterol  inhaler that she uses as needed.  She was started on Cymbalta  60 mg daily on 04/20/2024 for depression and has noticed that since initiation of medication it has been very difficult to lose any further weight.  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  She does follow annually with oncology.  She has noticed that she does get some lower back pain which is more  prevalent with weight gain.  Anthropometrics and Bioimpedance Analysis   Body mass index is 36.94 kg/m. Body Fat Mass : 44.3 % Visceral Fat Mass Rating : 11   Obesity Related Diseases and Complications  Obesity Quality of Life and Psychosocial Complications: Reduced health-related quality of life and Decrease physical activity and social participation  Cardiometabolic: Dyslipidemia or hypercholesterolemia, prediabetes  Biomechanical: Low back pain and Asthma   Weight Related History  She was referred by: PCP  When asked what they would like to accomplish? She states: Adopt a healthier eating pattern and lifestyle, Improve energy levels and physical activity, Improve existing medical conditions, Improve quality of life, and lose weight  Weight history: Weight issues as middle school  Highest weight: 271 prior to sleeve gastrectomy 09/26/2021- lowest weight after gastrectomy is current weight of 222  Contributing factors: family history of obesity, use of obesogenic medications: Anticholinergics, moderate to high levels of stress, chronic skipping of meals, multiple weight loss attempts in the past, and sedentary job  Prior weight loss attempts: Weight Watchers, [employer]avia, and Balanced Plate / Portion Control  Current or previous pharmacotherapy: Phentermine  currently through PCP  Response to medication: Lost weight initially but was unable to sustain weight loss  Current nutrition plan: Portion control / smart choices  Greatest challenge with dieting: felt restrictive, felt burdensome, low energy, and meal preparation and cooking.  Current level of physical activity: Walking 30 minutes, twice a week  Barriers to Exercise: energy  Readiness  and Motivation  On a scale from 0 to 10 How ready are you to make changes to your eating and physical activity to lose weight? 10 How important is it for you to lose weight right now ? 10 How confident are you that you can lose  weight if you try? 10  Past Medical History   Past Medical History:  Diagnosis Date   Acid reflux 2022   ADD (attention deficit disorder)    Anemia 08/23/2021   on 09/27/21 HGB was 10.8 (Epic)   Anxiety    Asthma    Back pain    Class 2 obesity due to excess calories without serious comorbidity with body mass index (BMI) of 39.0 to 39.9 in adult 05/26/2019   Depression    Elevated blood-pressure reading without diagnosis of hypertension 05/26/2019   Ependymoma of central nervous system (HCC) 03/17/2021   Family history of breast cancer 04/28/2021   Family history of ovarian cancer 04/28/2021   Family history of pancreatic cancer 04/28/2021   Genetic testing 05/08/2021   Negative genetic testing: no pathogenic variants detected in [facility] CancerNext-Expanded +RNAinsight Panel.  The report date is May 02, 2021.  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     Vitamin D  deficiency    Wears contact lenses    Wears glasses      Objective    BP (!) 139/100   Pulse 89   Temp 98.7 F (37.1 C)   Ht 5' 5 (1.651 m)   Wt 222 lb (100.7 kg)   SpO2 99%   BMI 36.94 kg/m  She was weighed on the bioimpedance scale: Body mass index is  36.94 kg/m.    General:  Alert, oriented and cooperative. Patient is in no acute distress.  Respiratory: Normal respiratory effort, no problems with respiration noted   Gait: able to ambulate independently  Mental Status: Normal mood and affect. Normal behavior. Normal judgment and thought content.   Diagnostic Data Reviewed  BMET    Component Value Date/Time   NA 140 10/30/2023 1608   K 4.1 10/30/2023 1608   CL 103 10/30/2023 1608   CO2 25 10/30/2023 1608   GLUCOSE 79 10/30/2023 1608   GLUCOSE 100 (H) 02/20/2023 2126   BUN 13 10/30/2023 1608   CREATININE 0.85 10/30/2023 1608   CREATININE 0.77 11/20/2012 1535   CALCIUM 9.6 10/30/2023 1608   GFRNONAA >60 02/20/2023 2126   GFRAA 92 08/08/2020 1653   Lab Results  Component Value Date   HGBA1C 5.6 04/15/2024   HGBA1C 5.7 (H) 11/20/2012   Lab Results  Component Value Date   INSULIN  6.4 12/23/2019   CBC    Component Value Date/Time   WBC 5.5 10/30/2023 1608   WBC 8.6 02/20/2023 2126  RBC 4.89 10/30/2023 1608   RBC 4.35 02/20/2023 2126   HGB 13.3 10/30/2023 1608   HCT 41.3 10/30/2023 1608   PLT 209 10/30/2023 1608   MCV 85 10/30/2023 1608   MCH 27.2 10/30/2023 1608   MCH 27.6 02/20/2023 2126   MCHC 32.2 10/30/2023 1608   MCHC 32.5 02/20/2023 2126   RDW 12.6 10/30/2023 1608   Iron/TIBC/Ferritin/ %Sat    Component Value Date/Time   IRON 66 05/14/2023 0950   IRON 72 06/07/2021 0000   TIBC 370 05/14/2023 0950   TIBC 363 06/07/2021 0000   FERRITIN 29 05/14/2023 0950   IRONPCTSAT 18 05/14/2023 0950   IRONPCTSAT 20 06/07/2021 0000   Lipid Panel     Component Value Date/Time   CHOL 202 (H) 10/30/2023 1608   TRIG 66 10/30/2023 1608   HDL 54 10/30/2023 1608   CHOLHDL 3.7 10/30/2023 1608   CHOLHDL 2.9 11/20/2012 1535   VLDL 15 11/20/2012 1535   LDLCALC 136 (H) 10/30/2023 1608   Hepatic Function Panel     Component Value Date/Time   PROT 6.2 10/30/2023 1608   ALBUMIN 4.1 10/30/2023 1608   AST 13 10/30/2023  1608   ALT 10 10/30/2023 1608   ALKPHOS 106 10/30/2023 1608   BILITOT 0.3 10/30/2023 1608   BILIDIR <0.10 08/23/2021 1601      Component Value Date/Time   TSH 1.720 10/30/2023 1608    Medications  Outpatient Encounter Medications as of 05/27/2024  Medication Sig   albuterol  (PROVENTIL ) (5 MG/ML) 0.5% nebulizer solution Take 0.5 mLs (2.5 mg total) by nebulization every 6 (six) hours as needed for wheezing or shortness of breath.   albuterol  (VENTOLIN  HFA) 108 (90 Base) MCG/ACT inhaler Inhale 1-2 puffs into the lungs every 6 (six) hours as needed for wheezing or shortness of breath.   budesonide -formoterol  (SYMBICORT ) 80-4.5 MCG/ACT inhaler Inhale 2 puffs into the lungs in the morning and at bedtime.   DULoxetine  (CYMBALTA ) 60 MG capsule Take 1 capsule by mouth once daily   MYFEMBREE 40-1-0.5 MG TABS Take 1 tablet by mouth daily.   pantoprazole  (PROTONIX ) 40 MG tablet Take 40 mg by mouth daily.   No facility-administered encounter medications on file as of 05/27/2024.     Assessment and Plan   Pure hypercholesterolemia  Mild intermittent asthma without complication  Moderate episode of recurrent major depressive disorder (HCC)  Prediabetes  Ependymoma of central nervous system (HCC)  Class 2 severe obesity with serious comorbidity and body mass index (BMI) of 37.0 to 37.9 in adult, unspecified obesity type (HCC)  History of sleeve gastrectomy       Obesity Treatment and Action Plan:  Patient will work on garnering support from family and friends to begin weight loss journey. Will work on eliminating or reducing the presence of highly palatable, calorie dense foods in the home. Will complete provided nutritional and psychosocial assessment questionnaire before the next appointment. Will be scheduled for indirect calorimetry to determine resting energy expenditure in a fasting state.  This will allow us  to create a reduced calorie, high-protein meal plan to promote loss of  fat mass while preserving muscle mass. Counseled on the health benefits of losing 5%-15% of total body weight. Was counseled on nutritional approaches to weight loss and benefits of reducing processed foods and consuming plant-based foods and high quality protein as part of nutritional weight management. Was counseled on pharmacotherapy and role as an adjunct in weight management.   Education and Additional resources  She was weighed  on the bioimpedance scale and results were discussed and documented in the synopsis.  We discussed obesity as a progressive, chronic disease and the importance of a more detailed evaluation of all the factors contributing to the disease.  We reviewed the basic principles in obesity management.   We discussed the importance of long term lifestyle changes which include nutrition, exercise and behavioral modification as well as the importance of customizing this to her specific health and social needs.  We reviewed the role of medical interventions including pharmacotherapy and surgical interventions.   We discussed the benefits of reaching a healthier weight to alleviate the symptoms of existing conditions and reduce the risks of the biomechanical, cardiometabolic and psychological effects of obesity.  We reviewed our program approach and philosophy, which are guided by the four pillars of obesity medicine.  We discussed how to prepare for intake appointment and the importance of fasting and avoidance of stimulants for at least 8 hours prior to indirect calorimetry.  Laney Cadmus appears to be in the action stage of change and reports being ready to initiate intensive lifestyle and behavioral modifications as part of their weight loss journey.  Attestation  Reviewed by clinician on day of visit: allergies, medications, problem list, medical history, surgical history, family history, social history, and previous encounter notes pertinent to obesity  diagnosis.  I have spent 38 minutes in the care of the patient today including: 2 minutes before the visit reviewing and preparing the chart. 18 minutes face-to-face assessing and reviewing listed medical problems as outlined in obesity care plan, providing nutritional and behavioral counseling on topics outlined in the obesity care plan, independently interpreting test results and goals of care, as described in assessment and plan, reviewing and discussing biometric information and progress, and reviewing latest PCP notes and specialist consultations 18 minutes after the visit updating chart and documentation of encounter.  Zophia Marrone ANP-C

## 2024-08-28 ENCOUNTER — Encounter: Payer: Self-pay | Admitting: *Deleted

## 2024-10-19 ENCOUNTER — Ambulatory Visit: Payer: Self-pay | Admitting: Nurse Practitioner

## 2024-10-23 ENCOUNTER — Other Ambulatory Visit: Payer: Self-pay | Admitting: Nurse Practitioner

## 2024-10-27 ENCOUNTER — Other Ambulatory Visit: Payer: Self-pay | Admitting: Nurse Practitioner

## 2024-10-28 ENCOUNTER — Ambulatory Visit: Admitting: Nurse Practitioner

## 2024-10-28 ENCOUNTER — Encounter: Payer: Self-pay | Admitting: Nurse Practitioner

## 2024-10-28 VITALS — BP 121/82 | HR 92 | Temp 97.1°F | Ht 65.0 in | Wt 221.0 lb

## 2024-10-28 DIAGNOSIS — Z0001 Encounter for general adult medical examination with abnormal findings: Secondary | ICD-10-CM | POA: Insufficient documentation

## 2024-10-28 DIAGNOSIS — Z6836 Body mass index (BMI) 36.0-36.9, adult: Secondary | ICD-10-CM

## 2024-10-28 DIAGNOSIS — K219 Gastro-esophageal reflux disease without esophagitis: Secondary | ICD-10-CM | POA: Diagnosis not present

## 2024-10-28 DIAGNOSIS — Z9884 Bariatric surgery status: Secondary | ICD-10-CM

## 2024-10-28 DIAGNOSIS — J4520 Mild intermittent asthma, uncomplicated: Secondary | ICD-10-CM

## 2024-10-28 DIAGNOSIS — E6609 Other obesity due to excess calories: Secondary | ICD-10-CM | POA: Diagnosis not present

## 2024-10-28 DIAGNOSIS — F331 Major depressive disorder, recurrent, moderate: Secondary | ICD-10-CM | POA: Diagnosis not present

## 2024-10-28 DIAGNOSIS — E66812 Obesity, class 2: Secondary | ICD-10-CM | POA: Diagnosis not present

## 2024-10-28 DIAGNOSIS — E559 Vitamin D deficiency, unspecified: Secondary | ICD-10-CM | POA: Diagnosis not present

## 2024-10-28 DIAGNOSIS — F3281 Premenstrual dysphoric disorder: Secondary | ICD-10-CM | POA: Insufficient documentation

## 2024-10-28 DIAGNOSIS — N920 Excessive and frequent menstruation with regular cycle: Secondary | ICD-10-CM | POA: Insufficient documentation

## 2024-10-28 MED ORDER — ALBUTEROL SULFATE HFA 108 (90 BASE) MCG/ACT IN AERS: 1.0000 | INHALATION_SPRAY | Freq: Four times a day (QID) | RESPIRATORY_TRACT | 0 refills | Status: AC | PRN

## 2024-10-28 MED ORDER — ALBUTEROL SULFATE (5 MG/ML) 0.5% IN NEBU: 2.5000 mg | INHALATION_SOLUTION | Freq: Four times a day (QID) | RESPIRATORY_TRACT | 12 refills | Status: DC | PRN

## 2024-10-28 MED ORDER — PANTOPRAZOLE SODIUM 40 MG PO TBEC: 40.0000 mg | DELAYED_RELEASE_TABLET | Freq: Every day | ORAL | 0 refills | Status: AC

## 2024-10-28 MED ORDER — BUDESONIDE-FORMOTEROL FUMARATE 80-4.5 MCG/ACT IN AERO: 2.0000 | INHALATION_SPRAY | Freq: Two times a day (BID) | RESPIRATORY_TRACT | 12 refills | Status: AC

## 2024-10-28 MED ORDER — TIRZEPATIDE-WEIGHT MANAGEMENT 2.5 MG/0.5ML ~~LOC~~ SOLN: 2.5000 mg | SUBCUTANEOUS | 0 refills | Status: DC

## 2024-10-28 MED ORDER — DULOXETINE HCL 60 MG PO CPEP: 60.0000 mg | ORAL_CAPSULE | Freq: Every day | ORAL | 0 refills | Status: AC

## 2024-10-28 NOTE — Progress Notes (Signed)
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 weight-loss goal that is healthy and reasonable for you. Limit your screen time. Find ways to reward yourself that do not involve food. Do not drink alcohol if: Your health care provider tells you not to drink. You are pregnant, may be pregnant, or are planning to become pregnant. If you drink alcohol: Limit how much you have to: 0-1 drink a day for women. 0-2 drinks a day for men. Know how much alcohol is in your drink. In the [place] one drink equals one 12 oz bottle of beer (355 mL), one 5 oz glass of wine (148 mL), or one 1 oz glass of hard liquor (44 mL). General instructions Keep a weight-loss journal to keep track of the food you eat and how much exercise you get. Take over-the-counter and prescription medicines only as told by your health care provider. Take vitamins and supplements only as told by your health care provider. Consider joining a support group. Your health care provider may be able to recommend a support group. Pay  attention to your mental health as obesity can lead to depression or self esteem issues. Keep all follow-up visits. This is important. Contact a health care provider if: You are unable to meet your weight-loss goal after six weeks of dietary and lifestyle changes. You have trouble breathing. Summary Obesity is the condition of having too much total body fat. Being overweight or obese means that your weight is greater than what is considered healthy for your body size. Work with your health care provider and a dietitian to set a weight-loss goal that is healthy and reasonable for you. Exercise regularly, as told by your health care provider. Ask your health care provider what types of exercise are safe for you and how often you should exercise. This information is not intended to replace advice given to you by your health care provider. Make sure you discuss any questions you have with your health care provider. Document Revised: 05/16/2021 Document Reviewed: 05/16/2021 Elsevier Patient Education  2024 Elsevier Inc. GERD in Adults: Diet Changes When you have gastroesophageal reflux disease (GERD), you may need to make changes to your diet. Choosing the right foods can help with your symptoms. Think about working with an expert in healthy eating called a dietitian. They can help you make healthy food choices. What are tips for following this plan? Reading food labels Look for foods that are low in saturated fat. Foods that may help with your symptoms include: Foods with less than 5% of daily value (DV) of fat. Foods with 0 grams of trans  fat. Cooking Cook your food in ways that don't use a lot of fat. These ways include: Baking. Steaming. Grilling. Broiling. To add flavor, try to use herbs that are low in spice and acidity. Avoid frying your food. Meal planning  Eat small meals often rather than eating 3 large meals each day. Eat your meals slowly in a place where you feel relaxed. If  told by your health care provider, avoid: Foods that cause symptoms. Keep a food diary to keep track of foods that cause symptoms. Alcohol. Drinking a lot of liquid with meals. General instructions For 2-3 hours after you eat, avoid: Bending over. Exercise. Lying down. Chew sugar-free gum after meals. What foods should I eat? Eat a healthy diet. Try to include: Foods with high amounts of fiber. These include: Fruits and vegetables. Whole grains and beans. Low-fat dairy products. Lean meats, fish, and poultry. Egg whites. Foods that cause symptoms in someone else may not cause symptoms for you. Work with your provider to find foods that are safe for you. The items listed above may not be all the foods and drinks you can have. Talk with a dietitian to learn more. The items listed above may not be a complete list of foods and beverages you can eat and drink. Contact a dietitian for more information. What foods should I avoid? Limiting some of these foods may help with your symptoms. Each person is different. Talk with a dietitian or your provider to help you find the exact foods to avoid. Some of the foods to avoid may include: Fruits Fruits with a lot of acid in them. These may include citrus fruits, such as oranges, grapefruit, pineapple, and lemons. Vegetables Deep-fried vegetables, such as French fries. Vegetables, sauces, or toppings made with added fat and vegetables with acid in them. These may include tomatoes and tomato products, chili peppers, onions, garlic, and horseradish. Grains Pastries or quick breads with added fat. Meats and other proteins High-fat meats, such as fatty beef or pork, hot dogs, ribs, ham, sausage, salami, and bacon. Fried meat or protein, such as fried fish and fried chicken. Egg yolks. Fats and oils Butter. Margarine. Shortening. Ghee. Drinks Coffee and other drinks with caffeine  in them. Fizzy and sugary drinks, such as soda and energy drinks. Fruit  juice made with acidic fruits, such as orange or grapefruit. Tomato juice. Sweets and desserts Chocolate and cocoa. Donuts. Seasonings and condiments Mint, such as peppermint and spearmint. Condiments, herbs, or seasonings that cause symptoms. These may include curry, hot sauce, or vinegar-based salad dressings. The items listed above may not be all the foods and drinks you should avoid. Talk with a dietitian to learn more. Questions to ask your health care provider Changes to your diet and everyday life are often the first steps taken to manage symptoms of GERD. If these changes don't help, talk with your provider about taking medicines. Where to find more information International [employer] for Gastrointestinal Disorders: aboutgerd.org This information is not intended to replace advice given to you by your health care provider. Make sure you discuss any questions you have with your health care provider. Document Revised: 08/20/2023 Document Reviewed: 03/06/2023 Elsevier Patient Education  2024 Elsevier Inc. Asthma, Adult  Asthma is a long-term (chronic) condition that causes recurrent episodes in which the lower airways in the lungs become tight and narrow. The narrowing is caused by inflammation and tightening of the smooth muscle around the lower airways. Asthma episodes, also called asthma attacks or asthma flares,  may cause coughing, making high-pitched whistling sounds when you breathe, most often when you breathe out (wheezing), shortness of breath, and chest pain. The airways may produce extra mucus caused by the inflammation and irritation. During an attack, it can be difficult to breathe. Asthma attacks can range from minor to life-threatening. Asthma cannot be cured, but medicines and lifestyle changes can help control it and treat acute attacks. It is important to keep your asthma well controlled so the condition does not interfere with your daily life. What are the causes? This  condition is believed to be caused by inherited (genetic) and environmental factors, but its exact cause is not known. What can trigger an asthma attack? Many things can bring on an asthma attack or make symptoms worse. These triggers are different for every person. Common triggers include: Allergens and irritants like mold, dust, pet dander, cockroaches, pollen, air pollution, and chemical odors. Cigarette smoke. Weather changes and cold air. Stress and strong emotional responses such as crying or laughing hard. Certain medications such as aspirin or beta blockers. Infections and inflammatory conditions, such as the flu, a cold, pneumonia, or inflammation of the nasal membranes (rhinitis). Gastroesophageal reflux disease (GERD). What are the signs or symptoms? Symptoms may occur right after exposure to an asthma trigger or hours later and can vary by person. Common signs and symptoms include: Wheezing. Trouble breathing (shortness of breath). Excessive nighttime or early morning coughing. Chest tightness. Tiredness (fatigue) with minimal activity. Difficulty talking in complete sentences. Poor exercise tolerance. How is this diagnosed? This condition is diagnosed based on: A physical exam and your medical history. Tests, which may include: Lung function studies to evaluate the flow of air in your lungs. Allergy tests. Imaging tests, such as X-rays. How is this treated? There is no cure, but symptoms can be controlled with proper treatment. Treatment usually involves: Identifying and avoiding your asthma triggers. Inhaled medicines. Two types are commonly used to treat asthma, depending on severity: Controller medicines. These help prevent asthma symptoms from occurring. They are taken every day. Fast-acting reliever or rescue medicines. These quickly relieve asthma symptoms. They are used as needed and provide short-term relief. Using other medicines, such as: Allergy medicines,  such as antihistamines, if your asthma attacks are triggered by allergens. Immune medicines (immunomodulators). These are medicines that help control the immune system. Using supplemental oxygen. This is only needed during a severe episode. Creating an asthma action plan. An asthma action plan is a written plan for managing and treating your asthma attacks. This plan includes: A list of your asthma triggers and how to avoid them. Information about when medicines should be taken and when their dosage should be changed. Instructions about using a device called a peak flow meter. A peak flow meter measures how well the lungs are working and the severity of your asthma. It helps you monitor your condition. Follow these instructions at home: Take over-the-counter and prescription medicines only as told by your health care provider. Stay up to date on all vaccinations as recommended by your healthcare provider, including vaccines for the flu and pneumonia. Use a peak flow meter and keep track of your peak flow readings. Understand and use your asthma action plan to address any asthma flares. Do not smoke or allow anyone to smoke in your home. Contact a health care provider if: You have wheezing, shortness of breath, or a cough that is not responding to medicines. Your medicines are causing side effects, such as a rash, itching,  swelling, or trouble breathing. You need to use a reliever medicine more than 2-3 times a week. Your peak flow reading is still at 50-79% of your personal best after following your action plan for 1 hour. You have a fever and shortness of breath. Get help right away if: You are getting worse and do not respond to treatment during an asthma attack. You are short of breath when at rest or when doing very little physical activity. You have difficulty eating, drinking, or talking. You have chest pain or tightness. You develop a fast heartbeat or palpitations. You have a bluish  color to your lips or fingernails. You are light-headed or dizzy, or you faint. Your peak flow reading is less than 50% of your personal best. You feel too tired to breathe normally. These symptoms may be an emergency. Get help right away. Call 911. Do not wait to see if the symptoms will go away. Do not drive yourself to the hospital. Summary Asthma is a long-term (chronic) condition that causes recurrent episodes in which the airways become tight and narrow. Asthma episodes, also called asthma attacks or asthma flares, can cause coughing, wheezing, shortness of breath, and chest pain. Asthma cannot be cured, but medicines and lifestyle changes can help keep it well controlled and prevent asthma flares. Make sure you understand how to avoid triggers and how and when to use your medicines. Asthma attacks can range from minor to life-threatening. Get help right away if you have an asthma attack and do not respond to treatment with your usual rescue medicines. This information is not intended to replace advice given to you by your health care provider. Make sure you discuss any questions you have with your health care provider. Document Revised: 07/26/2021 Document Reviewed: 07/17/2021 Elsevier Patient Education  2024 Elsevier Inc. Managing Depression, Adult Depression is a mental health condition that affects your thoughts, feelings, and actions. Being diagnosed with depression can bring you relief if you did not know why you have felt or behaved a certain way. It could also leave you feeling overwhelmed. Finding ways to manage your symptoms can help you feel more positive about your future. How to manage lifestyle changes Being depressed is difficult. Depression can increase the level of everyday stress. Stress can make depression symptoms worse. You may believe your symptoms cannot be managed or will never improve. However, there are many things you can try to help manage your symptoms. There is  hope. Managing stress  Stress is your body's reaction to life changes and events, both good and bad. Stress can add to your feelings of depression. Learning to manage your stress can help lessen your feelings of depression. Try some of the following approaches to reducing your stress (stress reduction techniques): Listen to music that you enjoy and that inspires you. Try using a meditation app or take a meditation class. Develop a practice that helps you connect with your spiritual self. Walk in nature, pray, or go to a place of worship. Practice deep breathing. To do this, inhale slowly through your nose. Pause at the top of your inhale for a few seconds and then exhale slowly, letting yourself relax. Repeat this three or four times. Practice yoga to help relax and work your muscles. Choose a stress reduction technique that works for you. These techniques take time and practice to develop. Set aside 5-15 minutes a day to do them. Therapists can offer training in these techniques. Do these things to help manage stress: Keep  a journal. Know your limits. Set healthy boundaries for yourself and others, such as saying no when you think something is too much. Pay attention to how you react to certain situations. You may not be able to control everything, but you can change your reaction. Add humor to your life by watching funny movies or shows. Make time for activities that you enjoy and that relax you. Spend less time using electronics, especially at night before bed. The light from screens can make your brain think it is time to get up rather than go to bed.   Medicines Medicines, such as antidepressants, are often a part of treatment for depression. Talk with your pharmacist or health care provider about all the medicines, supplements, and herbal products that you take, their possible side effects, and what medicines and other products are safe to take together. Make sure to report any side  effects you may have to your health care provider. Relationships Your health care provider may suggest family therapy, couples therapy, or individual therapy as part of your treatment. How to recognize changes Everyone responds differently to treatment for depression. As you recover from depression, you may start to: Have more interest in doing activities. Feel more hopeful. Have more energy. Eat a more regular amount of food. Have better mental focus. It is important to recognize if your depression is not getting better or is getting worse. The symptoms you had in the beginning may return, such as: Feeling tired. Eating too much or too little. Sleeping too much or too little. Feeling restless, agitated, or hopeless. Trouble focusing or making decisions. Having unexplained aches and pains. Feeling irritable, angry, or aggressive. If you or your family members notice these symptoms coming back, let your health care provider know right away. Follow these instructions at home: Activity Try to get some form of exercise each day, such as walking. Try yoga, mindfulness, or other stress reduction techniques. Participate in group activities if you are able. Lifestyle Get enough sleep. Cut down on or stop using caffeine , tobacco, alcohol, and any other harmful substances. Eat a healthy diet that includes plenty of vegetables, fruits, whole grains, low-fat dairy products, and lean protein. Limit foods that are high in solid fats, added sugar, or salt (sodium). General instructions Take over-the-counter and prescription medicines only as told by your health care provider. Keep all follow-up visits. It is important for your health care provider to check on your mood, behavior, and medicines. Your health care provider may need to make changes to your treatment. Where to find support Talking to others  Friends and family members can be sources of support and guidance. Talk to trusted friends or  family members about your condition. Explain your symptoms and let them know that you are working with a health care provider to treat your depression. Tell friends and family how they can help. Finances Find mental health providers that fit with your financial situation. Talk with your health care provider if you are worried about access to food, housing, or medicine. Call your insurance company to learn about your co-pays and prescription plan. Where to find more information You can find support in your area from: Anxiety and Depression Association of America (ADAA): adaa.org Mental Health America: mentalhealthamerica.net [employer] on Mental Illness: nami.org Contact a health care provider if: You stop taking your antidepressant medicines, and you have any of these symptoms: Nausea. Headache. Light-headedness. Chills and body aches. Not being able to sleep (insomnia). You or your friends  and family think your depression is getting worse. Get help right away if: You have thoughts of hurting yourself or others. Get help right away if you feel like you may hurt yourself or others, or have thoughts about taking your own life. Go to your nearest emergency room or: Call 911. Call the National Suicide Prevention Lifeline at (629) 213-1424 or 988. This is open 24 hours a day. Text the Crisis Text Line at 307-205-5606. This information is not intended to replace advice given to you by your health care provider. Make sure you discuss any questions you have with your health care provider. Document Revised: 02/13/2022 Document Reviewed: 02/13/2022 Elsevier Patient Education  2024 Elsevier Inc. Vitamin D  Deficiency: What to Know Vitamin D  deficiency is when your body doesn't have enough vitamin D . Vitamin D  is important because: It helps the body to maintain calcium and phosphorus levels. It helps to keep your bones healthy. Not getting enough vitamin D  can make your bones soft. It reduces  irritation or inflammation in the body. It helps the body's defense system (immune system) work better. What are the causes? Not eating enough foods that have vitamin D  in them. Not getting enough sun. Having diseases that make it hard for your body to take in vitamin D . Having had part of your stomach or part of your small intestine taken out. What increases the risk? Being an older adult. Not spending much time outdoors. Living in a long-term care center. Having dark skin. Taking certain medicines, such as steroids or seizure medicines. Being overweight or very overweight (obese). Having long-term (chronic) kidney or liver disease. What are the signs or symptoms? Pain in the bones. Pain in the muscles. Not being able to walk normally. Bones that break easily. Joint pain. How is this diagnosed? This condition may be diagnosed with blood tests. Imaging tests such as X-rays may also be done to look for weakness in the bone. How is this treated? Treatment may include taking supplements. You may be told to take vitamin D  or calcium.  Your health care provider will tell you what dose is best for you. Follow these instructions at home: Eating and drinking Eat foods that contain vitamin D , such as: Dairy products, cereals, or juices that have vitamin D  added to them. Check the label on the package to see if vitamin D  was added to your food. Fish, such as salmon or trout. Eggs. The vitamin D  is in the yolk. Mushrooms that were treated with UV light. Beef liver. The items listed above may not be all the foods and drinks that have vitamin D . Talk with a dietitian to learn more. General instructions Take your medicines only as told. Take supplements only as told. Get regular, safe exposure to natural sunlight. Do not use a tanning bed. Maintain a healthy weight. Lose weight if needed. Contact a health care provider if: Your symptoms do not go away. You throw up or feel like you may  throw up. You have trouble pooping (constipation), or you poop less than usual. This information is not intended to replace advice given to you by your health care provider. Make sure you discuss any questions you have with your health care provider. Document Revised: 02/24/2024 Document Reviewed: 12/27/2023 Elsevier Patient Education  2025 [employer].  The above assessment and management plan was discussed with the patient. The patient verbalized understanding of and has agreed to the management plan. Patient is aware to call the clinic if they develop any new  symptoms or if symptoms persist or worsen. Patient is aware when to return to the clinic for a follow-up visit. Patient educated on when it is appropriate to go to the emergency department.   Nena Cassis Morton Hummer, [place] [facility] [address] [place], [place] [address] [phone]    [1]  Allergies Allergen Reactions   Ceftriaxone Hives and Rash    No SOB - patient willing to try again   Diclofenac Rash   Fluconazole  Hives    Raw rash on abdomen and she had sores and swelling in mouth     Pineapple Shortness Of Breath   Latex Rash   Bactrim  [Sulfamethoxazole -Trimethoprim ] Hives   "

## 2024-10-29 ENCOUNTER — Telehealth: Payer: Self-pay | Admitting: Nurse Practitioner

## 2024-10-29 LAB — CBC WITH DIFFERENTIAL/PLATELET
Basophils Absolute: 0 x10E3/uL (ref 0.0–0.2)
Basos: 1 %
EOS (ABSOLUTE): 0.1 x10E3/uL (ref 0.0–0.4)
Eos: 3 %
Hematocrit: 41.9 % (ref 34.0–46.6)
Hemoglobin: 13.5 g/dL (ref 11.1–15.9)
Immature Grans (Abs): 0 x10E3/uL (ref 0.0–0.1)
Immature Granulocytes: 0 %
Lymphocytes Absolute: 2.2 x10E3/uL (ref 0.7–3.1)
Lymphs: 51 %
MCH: 27.2 pg (ref 26.6–33.0)
MCHC: 32.2 g/dL (ref 31.5–35.7)
MCV: 84 fL (ref 79–97)
Monocytes Absolute: 0.3 x10E3/uL (ref 0.1–0.9)
Monocytes: 7 %
Neutrophils Absolute: 1.6 x10E3/uL (ref 1.4–7.0)
Neutrophils: 38 %
Platelets: 229 x10E3/uL (ref 150–450)
RBC: 4.97 x10E6/uL (ref 3.77–5.28)
RDW: 13.7 % (ref 11.7–15.4)
WBC: 4.3 x10E3/uL (ref 3.4–10.8)

## 2024-10-29 LAB — CMP14+EGFR
ALT: 11 IU/L (ref 0–32)
AST: 15 IU/L (ref 0–40)
Albumin: 3.7 g/dL — ABNORMAL LOW (ref 3.9–4.9)
Alkaline Phosphatase: 78 IU/L (ref 41–116)
BUN/Creatinine Ratio: 8 — ABNORMAL LOW (ref 9–23)
BUN: 7 mg/dL (ref 6–24)
Bilirubin Total: 0.3 mg/dL (ref 0.0–1.2)
CO2: 26 mmol/L (ref 20–29)
Calcium: 9.3 mg/dL (ref 8.7–10.2)
Chloride: 105 mmol/L (ref 96–106)
Creatinine, Ser: 0.92 mg/dL (ref 0.57–1.00)
Globulin, Total: 2.2 g/dL (ref 1.5–4.5)
Glucose: 77 mg/dL (ref 70–99)
Potassium: 3.9 mmol/L (ref 3.5–5.2)
Sodium: 142 mmol/L (ref 134–144)
Total Protein: 5.9 g/dL — ABNORMAL LOW (ref 6.0–8.5)
eGFR: 79 mL/min/1.73

## 2024-10-29 LAB — LIPID PANEL
Chol/HDL Ratio: 3.7 ratio (ref 0.0–4.4)
Cholesterol, Total: 207 mg/dL — ABNORMAL HIGH (ref 100–199)
HDL: 56 mg/dL
LDL Chol Calc (NIH): 139 mg/dL — ABNORMAL HIGH (ref 0–99)
Triglycerides: 66 mg/dL (ref 0–149)
VLDL Cholesterol Cal: 12 mg/dL (ref 5–40)

## 2024-10-29 LAB — THYROID PANEL WITH TSH
Free Thyroxine Index: 1.9 (ref 1.2–4.9)
T3 Uptake Ratio: 27 % (ref 24–39)
T4, Total: 7.1 ug/dL (ref 4.5–12.0)
TSH: 1.02 u[IU]/mL (ref 0.450–4.500)

## 2024-10-29 LAB — VITAMIN D 25 HYDROXY (VIT D DEFICIENCY, FRACTURES): Vit D, 25-Hydroxy: 38.1 ng/mL (ref 30.0–100.0)

## 2024-10-29 LAB — CORTISOL: Cortisol: 6.7 ug/dL (ref 6.2–19.4)

## 2024-10-29 MED ORDER — ALBUTEROL SULFATE (2.5 MG/3ML) 0.083% IN NEBU: 2.5000 mg | INHALATION_SOLUTION | Freq: Four times a day (QID) | RESPIRATORY_TRACT | 12 refills | Status: AC | PRN

## 2024-10-29 NOTE — Telephone Encounter (Signed)
 Can covering provider advise on this?   Copied from CRM [id number]. Topic: Clinical - Prescription Issue >> Oct 29, 2024  8:54 AM Marda MATSU wrote: Reason for CRM: Rosina with [facility] is calling regarding the medication:  albuterol  (PROVENTIL ) (5 MG/ML) 0.5% nebulizer solution   Rosina is asking if it is okay to make a change in the RX.  She would like to know if she can change it to 0.083%   Please advise.

## 2024-10-29 NOTE — Telephone Encounter (Signed)
 Prescription sent to pharmacy.

## 2024-10-30 ENCOUNTER — Telehealth: Payer: Self-pay

## 2024-10-30 ENCOUNTER — Encounter: Payer: Self-pay | Admitting: Pharmacist

## 2024-10-30 NOTE — Telephone Encounter (Signed)
 Copied from CRM #[id number]. Topic: Clinical - Prescription Issue >> Oct 30, 2024  9:31 AM Graeme ORN wrote: Reason for CRM: Patient states pharmacy says her Rx is ready but there is a coupon that could hep with the cost. Was told to contact provider to see if that is something they can help her with. tirzepatide  (ZEPBOUND ) 2.5 MG/0.5ML injection vial Thank You

## 2024-11-02 ENCOUNTER — Telehealth: Payer: Self-pay | Admitting: Pharmacist

## 2024-11-02 DIAGNOSIS — E6609 Other obesity due to excess calories: Secondary | ICD-10-CM

## 2024-11-02 MED ORDER — ZEPBOUND 2.5 MG/0.5ML ~~LOC~~ SOAJ: 2.5000 mg | SUBCUTANEOUS | 2 refills | Status: DC

## 2024-11-02 NOTE — Telephone Encounter (Unsigned)
 Patient requested pens vs vials due to insurance coverage RX printed, not sure why escribing down

## 2024-11-03 MED ORDER — ZEPBOUND 2.5 MG/0.5ML ~~LOC~~ SOAJ: 2.5000 mg | SUBCUTANEOUS | 2 refills | Status: DC

## 2024-11-03 NOTE — Addendum Note (Signed)
 Addended by: Brecklyn Galvis D on: 11/03/2024 04:24 PM   Modules accepted: Orders

## 2024-11-04 NOTE — Telephone Encounter (Signed)
 I called and spoke with patient and she confirmed that per the pharmacy, the Zepbound  Pen is not going to be cheaper for her. Says she was advised to contact PCP and see if Ozempic  can be sent in for her instead with a prior authorization since she is considered prediabetic. Last A1C was checked on 04/15/2024. Patient is willing to come in and have A1C rechecked if needed to start prior auth for Ozempic . Please advise.

## 2024-11-05 ENCOUNTER — Ambulatory Visit: Payer: Self-pay | Admitting: Nurse Practitioner

## 2024-11-05 ENCOUNTER — Emergency Department (HOSPITAL_COMMUNITY)
Admission: EM | Admit: 2024-11-05 | Discharge: 2024-11-06 | Disposition: A | Discharge status: Home / Self Care | Attending: Emergency Medicine | Admitting: Emergency Medicine

## 2024-11-05 ENCOUNTER — Other Ambulatory Visit: Payer: Self-pay

## 2024-11-05 ENCOUNTER — Emergency Department (HOSPITAL_COMMUNITY)

## 2024-11-05 ENCOUNTER — Other Ambulatory Visit: Payer: Self-pay | Admitting: Nurse Practitioner

## 2024-11-05 ENCOUNTER — Encounter (HOSPITAL_COMMUNITY): Payer: Self-pay

## 2024-11-05 DIAGNOSIS — Z91040 Latex allergy status: Secondary | ICD-10-CM | POA: Diagnosis not present

## 2024-11-05 DIAGNOSIS — E876 Hypokalemia: Secondary | ICD-10-CM | POA: Diagnosis not present

## 2024-11-05 DIAGNOSIS — D72819 Decreased white blood cell count, unspecified: Secondary | ICD-10-CM | POA: Insufficient documentation

## 2024-11-05 DIAGNOSIS — R059 Cough, unspecified: Secondary | ICD-10-CM | POA: Insufficient documentation

## 2024-11-05 DIAGNOSIS — J111 Influenza due to unidentified influenza virus with other respiratory manifestations: Secondary | ICD-10-CM

## 2024-11-05 DIAGNOSIS — Z9884 Bariatric surgery status: Secondary | ICD-10-CM

## 2024-11-05 DIAGNOSIS — E6609 Other obesity due to excess calories: Secondary | ICD-10-CM

## 2024-11-05 MED ORDER — ONDANSETRON HCL 4 MG/2ML IJ SOLN
4.0000 mg | Freq: Once | INTRAMUSCULAR | Status: AC
  Administered 2024-11-05: 4 mg via INTRAVENOUS
  Filled 2024-11-05: qty 2

## 2024-11-05 MED ORDER — OZEMPIC (0.25 OR 0.5 MG/DOSE) 2 MG/1.5ML ~~LOC~~ SOPN: 0.5000 mg | PEN_INJECTOR | SUBCUTANEOUS | 0 refills | Status: DC

## 2024-11-05 MED ORDER — KETOROLAC TROMETHAMINE 15 MG/ML IJ SOLN
15.0000 mg | Freq: Once | INTRAMUSCULAR | Status: AC
  Administered 2024-11-05: 15 mg via INTRAVENOUS
  Filled 2024-11-05: qty 1

## 2024-11-05 MED ORDER — IPRATROPIUM-ALBUTEROL 0.5-2.5 (3) MG/3ML IN SOLN
3.0000 mL | Freq: Once | RESPIRATORY_TRACT | Status: AC
  Administered 2024-11-05: 3 mL via RESPIRATORY_TRACT
  Filled 2024-11-05: qty 3

## 2024-11-05 MED ORDER — SODIUM CHLORIDE 0.9 % IV BOLUS
1000.0000 mL | Freq: Once | INTRAVENOUS | Status: AC
  Administered 2024-11-05: 1000 mL via INTRAVENOUS

## 2024-11-05 MED ORDER — ACETAMINOPHEN 500 MG PO TABS
1000.0000 mg | ORAL_TABLET | Freq: Once | ORAL | Status: AC
  Administered 2024-11-05: 1000 mg via ORAL
  Filled 2024-11-05: qty 2

## 2024-11-05 MED ORDER — OXYMETAZOLINE HCL 0.05 % NA SOLN
1.0000 | Freq: Once | NASAL | Status: AC
  Administered 2024-11-05: 1 via NASAL
  Filled 2024-11-05: qty 30

## 2024-11-05 NOTE — ED Provider Notes (Signed)
 Care assumed from [facility], patient with flulike illness and tachycardia pending labs and response to fluids.  I have reviewed her laboratory tests, and my interpretation is mild hypokalemia, mild leukopenia.  Leukopenia is likely secondary to viral infection.  I have ordered a dose of oral potassium.  Following 1 L of IV fluids, heart rate had come down but was still moderately elevated.  I just ordered a second liter of IV fluids following which her heart rate is normal and she is feeling much better.  I am discharging her with instructions to drink plenty of fluids, prescription given for oral potassium.  Results for orders placed or performed during the hospital encounter of 11/05/24  CBC with Differential   Collection Time: 11/05/24 11:56 PM  Result Value Ref Range   WBC 3.6 (L) 4.0 - 10.5 K/uL   RBC 4.44 3.87 - 5.11 MIL/uL   Hemoglobin 12.2 12.0 - 15.0 g/dL   HCT 62.5 63.9 - 53.9 %   MCV 84.2 80.0 - 100.0 fL   MCH 27.5 26.0 - 34.0 pg   MCHC 32.6 30.0 - 36.0 g/dL   RDW 87.0 88.4 - 84.4 %   Platelets 153 150 - 400 K/uL   nRBC 0.0 0.0 - 0.2 %   Neutrophils Relative % 82 %   Neutro Abs 2.9 1.7 - 7.7 K/uL   Lymphocytes Relative 8 %   Lymphs Abs 0.3 (L) 0.7 - 4.0 K/uL   Monocytes Relative 9 %   Monocytes Absolute 0.3 0.1 - 1.0 K/uL   Eosinophils Relative 0 %   Eosinophils Absolute 0.0 0.0 - 0.5 K/uL   Basophils Relative 1 %   Basophils Absolute 0.0 0.0 - 0.1 K/uL   Immature Granulocytes 0 %   Abs Immature Granulocytes 0.01 0.00 - 0.07 K/uL  Basic metabolic panel   Collection Time: 11/05/24 11:56 PM  Result Value Ref Range   Sodium 137 135 - 145 mmol/L   Potassium 3.2 (L) 3.5 - 5.1 mmol/L   Chloride 105 98 - 111 mmol/L   CO2 19 (L) 22 - 32 mmol/L   Glucose, Bld 84 70 - 99 mg/dL   BUN 5 (L) 6 - 20 mg/dL   Creatinine, Ser 9.14 0.44 - 1.00 mg/dL   Calcium 8.4 (L) 8.9 - 10.3 mg/dL   GFR, Estimated >39 >39 mL/min   Anion gap 13 5 - 15   DG Chest Portable 1  View Result Date: 11/05/2024 EXAM: 1 VIEW XRAY OF THE CHEST 11/05/2024 10:21:00 PM COMPARISON: None available. CLINICAL HISTORY: cough, sob FINDINGS: LUNGS AND PLEURA: No focal pulmonary opacity. No pleural effusion. No pneumothorax. HEART AND MEDIASTINUM: No acute abnormality of the cardiac and mediastinal silhouettes. BONES AND SOFT TISSUES: No acute osseous abnormality. IMPRESSION: 1. No acute cardiopulmonary abnormality. Electronically signed by: Dorethia Molt MD 11/05/2024 10:24 PM EST RP Workstation: [id number]      Raford Lenis, MD 11/06/24 0210

## 2024-11-05 NOTE — ED Triage Notes (Signed)
 Pt reports cough, body aches, sore throat, chills 3 days.  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

## 2024-11-05 NOTE — ED Provider Notes (Addendum)
 " [place] EMERGENCY DEPARTMENT AT [facility] Provider Note   CSN: [id number] Arrival date & time: 11/05/24  2007     Patient presents with: flu like symptoms   Shannon [name] is a 44 y.o. female.  She has history of asthma, she does not smoke.  Presents to the emergency room today for evaluation of cough and shortness of breath with nausea vomiting and bodyaches for 3 days.  She states she works at [employer] and has been exposed to multiple people with the flu.  She has been taking over-the-counter Tylenol  and NyQuil as well as Cheratussin without relief.  She states she has not taken a Tylenol  or other medications recently, she states she has pain in her chest with coughing only.  She has been using her inhaler with some relief of the shortness of breath.   HPI     Prior to Admission medications  Medication Sig Start Date End Date Taking? Authorizing Provider  albuterol  (PROVENTIL ) (2.5 MG/3ML) 0.083% nebulizer solution Take 3 mLs (2.5 mg total) by nebulization every 6 (six) hours as needed for wheezing or shortness of breath. 10/29/24   Lavell Bari LABOR, FNP  albuterol  (VENTOLIN  HFA) 108 (90 Base) MCG/ACT inhaler Inhale 1-2 puffs into the lungs every 6 (six) hours as needed for wheezing or shortness of breath. 10/28/24   [facility] Sebastian Pool, NP  budesonide -formoterol  (SYMBICORT ) 80-4.5 MCG/ACT inhaler Inhale 2 puffs into the lungs in the morning and at bedtime. 10/28/24   [facility] Sebastian Pool, NP  DULoxetine  (CYMBALTA ) 60 MG capsule Take 1 capsule (60 mg total) by mouth daily. 10/28/24   [facility] Sebastian, Pool, NP  MYFEMBREE 40-1-0.5 MG TABS Take 1 tablet by mouth daily. 04/19/23   [provider]  pantoprazole  (PROTONIX ) 40 MG tablet Take 1 tablet (40 mg total) by mouth daily. 10/28/24   [facility] Sebastian Pool, NP  Semaglutide ,0.25 or 0.5MG /DOS, (OZEMPIC , 0.25 OR 0.5 MG/DOSE,) 2 MG/1.5ML SOPN Inject 0.5 mg into the skin every 7 (seven) days. 11/05/24   [facility] Sebastian Pool, NP    Allergies: Ceftriaxone, Diclofenac, Fluconazole , Pineapple, Latex, and Bactrim  [sulfamethoxazole -trimethoprim ]    Review of Systems  Updated Vital Signs BP (!) 143/97   Pulse (!) 125   Temp (!) 100.7 F (38.2 C)   Resp 20   Wt 99.3 kg   SpO2 95%   BMI 36.44 kg/m   Physical Exam Vitals and nursing note reviewed.  Constitutional:      General: She is not in acute distress.    Appearance: She is well-developed.  HENT:     Head: Normocephalic and atraumatic.  Eyes:     Conjunctiva/sclera: Conjunctivae normal.  Cardiovascular:     Rate and Rhythm: Normal rate and regular rhythm.     Heart sounds: No murmur heard. Pulmonary:     Effort: Pulmonary effort is normal. No respiratory distress.     Breath sounds: Normal breath sounds.  Abdominal:     Palpations: Abdomen is soft.     Tenderness: There is no abdominal tenderness.  Musculoskeletal:        General: No swelling.     Cervical back: Neck supple.  Skin:    General: Skin is warm and dry.     Capillary Refill: Capillary refill takes less than 2 seconds.  Neurological:     Mental Status: She is alert.  Psychiatric:        Mood and Affect: Mood normal.     (  all labs ordered are listed, but only abnormal results are displayed) Labs Reviewed  CBC WITH DIFFERENTIAL/PLATELET  BASIC METABOLIC PANEL WITH GFR    EKG: None  Radiology: DG Chest Portable 1 View Result Date: 11/05/2024 EXAM: 1 VIEW XRAY OF THE CHEST 11/05/2024 10:21:00 PM COMPARISON: None available. CLINICAL HISTORY: cough, sob FINDINGS: LUNGS AND PLEURA: No focal pulmonary opacity. No pleural effusion. No pneumothorax. HEART AND MEDIASTINUM: No acute abnormality of the cardiac and mediastinal silhouettes. BONES AND SOFT TISSUES: No acute osseous abnormality. IMPRESSION: 1. No acute cardiopulmonary abnormality. Electronically signed by: Dorethia Molt MD 11/05/2024 10:24 PM EST RP Workstation: [id number]     Procedures    Medications Ordered in the ED  oxymetazoline  (AFRIN) 0.05 % nasal spray 1 spray (has no administration in time range)  ketorolac  (TORADOL ) 15 MG/ML injection 15 mg (has no administration in time range)  acetaminophen  (TYLENOL ) tablet 1,000 mg (1,000 mg Oral Given 11/05/24 2234)  sodium chloride  0.9 % bolus 1,000 mL (1,000 mLs Intravenous New Bag/Given 11/05/24 2245)  ondansetron  (ZOFRAN ) injection 4 mg (4 mg Intravenous Given 11/05/24 2245)  ipratropium-albuterol  (DUONEB) 0.5-2.5 (3) MG/3ML nebulizer solution 3 mL (3 mLs Nebulization Given 11/05/24 2242)                                    Medical Decision Making This patient presents to the ED for concern of cough, chills, body aches, this involves an extensive number of treatment options, and is a complaint that carries with it a high risk of complications and morbidity.  The differential diagnosis includes influenza, pneumonia, asthma exacerbation   Co morbidities that complicate the patient evaluation :   Asthma   Additional history obtained:  Additional history obtained from EMR External records from outside source obtained and reviewed including prior notes and labs    Imaging Studies ordered:  I ordered imaging studies including chest x-ray which shows no pulmonary edema or infiltrate I independently visualized and interpreted imaging within scope of identifying emergent findings  I agree with the radiologist interpretation       Problem List / ED Course / Critical interventions / Medication management  Here with cough, shortness of breath, fever and chills, body ache, sore throat ear pain also having sinus pressure.  Did taking OTC meds without relief.  Reports she has been pain careful attention to avoid taking Tylenol  in conjunction with Tylenol  containing combination medications.  Noted to be febrile and tachycardic, given IV fluids, suspect viral illness due to the influenza due to her exposures.  Will give IV  fluids, antipyretics and plan to recheck, CBC and BMP pending signed out to Dr. Raford  I have reviewed the patients home medicines and have made adjustments as needed       Amount and/or Complexity of Data Reviewed Labs: ordered. Radiology: ordered.  Risk OTC drugs. Prescription drug management.        Final diagnoses:  None    ED Discharge Orders     None          Suellen Sherran DELENA DEVONNA 11/05/24 2336    Suellen Sherran DELENA DEVONNA 11/05/24 2339    Patsey Lot, MD 11/06/24 2156  "

## 2024-11-06 ENCOUNTER — Inpatient Hospital Stay

## 2024-11-06 ENCOUNTER — Ambulatory Visit: Payer: Self-pay

## 2024-11-06 LAB — CBC WITH DIFFERENTIAL/PLATELET
Abs Immature Granulocytes: 0.01 K/uL (ref 0.00–0.07)
Basophils Absolute: 0 K/uL (ref 0.0–0.1)
Basophils Relative: 1 %
Eosinophils Absolute: 0 K/uL (ref 0.0–0.5)
Eosinophils Relative: 0 %
HCT: 37.4 % (ref 36.0–46.0)
Hemoglobin: 12.2 g/dL (ref 12.0–15.0)
Immature Granulocytes: 0 %
Lymphocytes Relative: 8 %
Lymphs Abs: 0.3 K/uL — ABNORMAL LOW (ref 0.7–4.0)
MCH: 27.5 pg (ref 26.0–34.0)
MCHC: 32.6 g/dL (ref 30.0–36.0)
MCV: 84.2 fL (ref 80.0–100.0)
Monocytes Absolute: 0.3 K/uL (ref 0.1–1.0)
Monocytes Relative: 9 %
Neutro Abs: 2.9 K/uL (ref 1.7–7.7)
Neutrophils Relative %: 82 %
Platelets: 153 K/uL (ref 150–400)
RBC: 4.44 MIL/uL (ref 3.87–5.11)
RDW: 12.9 % (ref 11.5–15.5)
WBC: 3.6 K/uL — ABNORMAL LOW (ref 4.0–10.5)
nRBC: 0 % (ref 0.0–0.2)

## 2024-11-06 LAB — BASIC METABOLIC PANEL WITH GFR
Anion gap: 13 (ref 5–15)
BUN: 5 mg/dL — ABNORMAL LOW (ref 6–20)
CO2: 19 mmol/L — ABNORMAL LOW (ref 22–32)
Calcium: 8.4 mg/dL — ABNORMAL LOW (ref 8.9–10.3)
Chloride: 105 mmol/L (ref 98–111)
Creatinine, Ser: 0.85 mg/dL (ref 0.44–1.00)
GFR, Estimated: >60 mL/min
Glucose, Bld: 84 mg/dL (ref 70–99)
Potassium: 3.2 mmol/L — ABNORMAL LOW (ref 3.5–5.1)
Sodium: 137 mmol/L (ref 135–145)

## 2024-11-06 MED ORDER — SODIUM CHLORIDE 0.9 % IV BOLUS
1000.0000 mL | Freq: Once | INTRAVENOUS | Status: AC
  Administered 2024-11-06: 1000 mL via INTRAVENOUS

## 2024-11-06 MED ORDER — KETOROLAC TROMETHAMINE 15 MG/ML IJ SOLN: 15.0000 mg | Freq: Once | INTRAMUSCULAR | Status: DC

## 2024-11-06 MED ORDER — POTASSIUM CHLORIDE CRYS ER 20 MEQ PO TBCR
40.0000 meq | EXTENDED_RELEASE_TABLET | Freq: Once | ORAL | Status: AC
  Administered 2024-11-06: 40 meq via ORAL
  Filled 2024-11-06: qty 2

## 2024-11-06 MED ORDER — MORPHINE SULFATE (PF) 4 MG/ML IV SOLN
4.0000 mg | Freq: Once | INTRAVENOUS | Status: AC
  Administered 2024-11-06: 4 mg via INTRAVENOUS
  Filled 2024-11-06: qty 1

## 2024-11-06 MED ORDER — POTASSIUM CHLORIDE CRYS ER 20 MEQ PO TBCR: 20.0000 meq | EXTENDED_RELEASE_TABLET | Freq: Two times a day (BID) | ORAL | 0 refills | Status: AC

## 2024-11-06 NOTE — Telephone Encounter (Signed)
 Apt scheduled.

## 2024-11-06 NOTE — Telephone Encounter (Signed)
 FYI Only or Action Required?: FYI only for provider: appointment scheduled on 11/06/24.  Patient was last seen in primary care on 10/28/2024 by Deitra Morton Sebastian Nena, NP.  Called Nurse Triage reporting Influenza.  Symptoms began several days ago.  Interventions attempted: OTC medications: Tylenol , ibuprofen .  Symptoms are: stable.  Triage Disposition: See Physician Within 24 Hours  Patient/caregiver understands and will follow disposition?: Yes                    Message from [address] sent at 11/06/2024  3:52 PM EST  Summary: Fever, body and facial pain, extreme fatigue   Reason for Triage:  The patient called in stating she was tested positive for the flu last night at [facility] but despite breathing treatments and a Toradol  shot still does not feel well at all. She complains of severe fatigue and body and facial pain. She was not prescribed Tamiflu  which she thought she was going to get. She would like to speak with a nurse. I will transfer her to E2C2 NT         Reason for Disposition  Earache  Answer Assessment - Initial Assessment Questions 1. DIAGNOSIS CONFIRMATION: When was the influenza diagnosed? By whom? Did you get a test for it?     Yesterday in the ED. No testing was done.  2. INFLUENZA MEDICINES: Were you prescribed any medicines for the influenza?  (e.g., zanamivir [Relenza], oseltamivir  [Tamiflu ]).      No, she was told by the ED provider she would receive a Tamiflu  prescription. No mention in providers note and patient did not receive the prescription.  3. SYMPTOMS: What is your main symptom or concern? (e.g., cough, fever, shortness of breath, muscle aches)     Whole body pain, fever, cough, bilateral ear aches.  4. ONSET: When did the symptoms start?      Cough started 3 days ago, other symptoms started yesterday.  5. COUGH: Do you have a cough? If Yes, ask: How bad is the cough?       Yes, severe productive cough with  yellow mucous.   6. FEVER: Do you have a fever? If Yes, ask: What is your temperature, how was it measured, and when did it start?     Fever 100.7 today, oral thermometer. Fever started yesterday.  7. BREATHING DIFFICULTY: Are you having any difficulty breathing? (e.g., normal; shortness of breath, wheezing, unable to speak)      No SOB at this time but does experience wheezing and severe coughing that induces vomiting (last episode 3 hours ago; 1 episode today).  8. PREGNANCY: Is there any chance you are pregnant? When was your last menstrual period?     She doesn't have a cycle.  9. HIGH RISK FOR COMPLICATIONS: Do you have any chronic medical problems? (e.g., asthma, heart or lung disease, obesity, weak immune system)     Asthma.  10. PREGNANCY: Is there any chance you are pregnant? When was your last menstrual period?       N/A.  11. O2 SATURATION MONITOR:  Do you use an oxygen saturation monitor (pulse oximeter) at home? If Yes, ask What is your reading (oxygen level) today? What is your usual oxygen saturation reading? (e.g., 95%)       No.  Protocols used: Influenza (Flu) Follow-up Call-A-AH

## 2024-11-06 NOTE — Discharge Instructions (Signed)
 Drink plenty of fluids.  Acetaminophen  and/or ibuprofen  as needed for fever and aching.  Please be aware that if you combine acetaminophen  and ibuprofen , you will get better pain and fever relief than you get from taking either medication by itself.

## 2024-11-11 ENCOUNTER — Telehealth: Payer: Self-pay | Admitting: Nurse Practitioner

## 2024-11-11 DIAGNOSIS — E66812 Obesity, class 2: Secondary | ICD-10-CM

## 2024-11-11 NOTE — Telephone Encounter (Signed)
 Patient sent a message via mychart requesting update on status of PA for Ozempic  Rx that was sent to our PA Team on 1/16.   Do we have an update yet?

## 2024-11-13 MED ORDER — ZEPBOUND 2.5 MG/0.5ML ~~LOC~~ SOAJ: 2.5000 mg | SUBCUTANEOUS | 0 refills | Status: DC

## 2024-11-13 NOTE — Telephone Encounter (Signed)
 Left detailed message making patient aware that insurance wont cover the Ozempic  without diabetes diagnosis so the Zepbound  was sent back in to the pharmacy for her if she wants to get that. I know the cost is a little pricey, starting at $299 I believe, but it looks like that is the cheapest option with her insurance plan. Advised for patient to call back or message via mychart if she has additional questions.

## 2024-11-13 NOTE — Telephone Encounter (Signed)
 RX for zepbound  sent.

## 2024-11-13 NOTE — Telephone Encounter (Signed)
 PA rec'd for Ozempic .   Dx of diabetes needed for Ozempic .   Please resend RX for brand Zepbound  (dx of obesity, mentioned in 10/28/24 chart note).

## 2024-11-25 ENCOUNTER — Ambulatory Visit: Admitting: Family Medicine

## 2024-11-25 ENCOUNTER — Encounter: Payer: Self-pay | Admitting: Family Medicine

## 2024-11-25 VITALS — BP 129/89 | HR 97 | Temp 97.9°F | Ht 65.0 in | Wt 225.0 lb

## 2024-11-25 DIAGNOSIS — F909 Attention-deficit hyperactivity disorder, unspecified type: Secondary | ICD-10-CM | POA: Diagnosis not present

## 2024-11-25 DIAGNOSIS — Z6837 Body mass index (BMI) 37.0-37.9, adult: Secondary | ICD-10-CM | POA: Diagnosis not present

## 2024-11-25 MED ORDER — WEGOVY 1.5 MG PO TABS: 1.5000 mg | ORAL_TABLET | Freq: Every day | ORAL | 0 refills | Status: AC

## 2024-11-25 NOTE — Progress Notes (Signed)
 "  Established Patient Office Visit  Patient ID: Shannon [name], female    DOB: 1981/04/05  Age: 44 y.o. MRN: [id number] PCP: Alcus Oneil ORN, FNP  Chief Complaint  Patient presents with   Follow-up    4 week follow up  Patient wants to know if she can get back on ADHD medication (Vyvanse ) that she is normally prescribed in the winter time, that will also help with her weight loss. Says it doesn't have to be Vyvanse , but something similar if that is preferred by PCP.   Has not started the Zepbound . Has had issues with getting it approved and filled.     Subjective:     HPI  Discussed the use of AI scribe software for clinical note transcription with the patient, who gave verbal consent to proceed.  History of Present Illness   Shannon [name] is a 44 year old female who presents to discuss weight loss medicines.  Elevated BMI with weight loss goal - Prescribed Zepbound  for weight management last month.  - Has not started yet due to having problems with the pharmacy. - Most recent quote is $299/month. - Previously prescribed Ozempic , but insurance denied coverage. - Patient has not started any GLP medication.   Impaired attention and focus - Patient wants to restart her Vyvanse  for ADHD. - She reports that in the wintertime she has more problems focusing. - Patient has a psychiatrist. - Vyvanse  previously helped with focus, but currently not on medication.      ROS    Objective:     BP 129/89 (Cuff Size: Large)   Pulse 97   Temp 97.9 F (36.6 C)   Ht 5' 5 (1.651 m)   Wt 225 lb (102.1 kg)   SpO2 97%   BMI 37.44 kg/m    Physical Exam Vitals reviewed.  Constitutional:      Appearance: Normal appearance.  HENT:     Head: Normocephalic and atraumatic.  Eyes:     Extraocular Movements: Extraocular movements intact.     Conjunctiva/sclera: Conjunctivae normal.     Pupils: Pupils are equal, round, and reactive to light.  Cardiovascular:     Rate and Rhythm:  Normal rate and regular rhythm.     Pulses: Normal pulses.     Heart sounds: Normal heart sounds. No murmur heard. Pulmonary:     Effort: Pulmonary effort is normal. No respiratory distress.     Breath sounds: Normal breath sounds.  Musculoskeletal:        General: No deformity. Normal range of motion.     Cervical back: Normal range of motion.  Skin:    General: Skin is warm and dry.  Neurological:     General: No focal deficit present.     Mental Status: She is alert and oriented to person, place, and time.  Psychiatric:        Mood and Affect: Mood normal.        Behavior: Behavior normal.      No results found for any visits on 11/25/24.    The 10-year ASCVD risk score (Arnett DK, et al., 2019) is: 0.9%    Assessment & Plan:   Problem List Items Addressed This Visit   None Visit Diagnoses       BMI 37.0-37.9, adult    -  Primary   Relevant Medications   semaglutide -weight management (WEGOVY ) 1.5 MG tablet     Attention deficit hyperactivity disorder (ADHD), unspecified ADHD type  Assessment and Plan    BMI 37 - No personal or family history of medullary thyroid  cancer or MEN 2. - Discussed the Wegovy  pill for weight loss cash pay option as this is cheaper than the quote for Zepbound . - Patient voiced interest in starting the Wegovy  pill. - Discussed potential benefits and side effects of GLP-1 medications. - Follow-up in 1 month for reevaluation or sooner for any concerns or side effects.     ADHD - Follow-up with psychiatrist for restarting Vyvanse    Return in about 4 weeks (around 12/23/2024).    Oneil LELON Severin, FNP [facility]   "

## 2024-12-30 ENCOUNTER — Ambulatory Visit: Admitting: Family Medicine
